# Patient Record
Sex: Male | Born: 1995 | Race: Black or African American | Hispanic: No | Marital: Single | Smoking: Former smoker
Health system: Southern US, Community
[De-identification: ages and names within clinical notes are randomized; demographics above are authoritative.]

## PROBLEM LIST (undated history)

## (undated) DIAGNOSIS — E119 Type 2 diabetes mellitus without complications: Secondary | ICD-10-CM

## (undated) DIAGNOSIS — N186 End stage renal disease: Secondary | ICD-10-CM

## (undated) DIAGNOSIS — I10 Essential (primary) hypertension: Secondary | ICD-10-CM

---

## 2019-06-05 DIAGNOSIS — N183 Chronic kidney disease, stage 3 unspecified: Secondary | ICD-10-CM | POA: Insufficient documentation

## 2019-06-06 DIAGNOSIS — Z9119 Patient's noncompliance with other medical treatment and regimen: Secondary | ICD-10-CM | POA: Insufficient documentation

## 2019-06-06 DIAGNOSIS — Z91199 Patient's noncompliance with other medical treatment and regimen due to unspecified reason: Secondary | ICD-10-CM | POA: Insufficient documentation

## 2019-06-24 DIAGNOSIS — I10 Essential (primary) hypertension: Secondary | ICD-10-CM | POA: Insufficient documentation

## 2019-06-24 DIAGNOSIS — Z5989 Other problems related to housing and economic circumstances: Secondary | ICD-10-CM | POA: Insufficient documentation

## 2019-12-01 DIAGNOSIS — R801 Persistent proteinuria, unspecified: Secondary | ICD-10-CM | POA: Insufficient documentation

## 2019-12-01 DIAGNOSIS — M7989 Other specified soft tissue disorders: Secondary | ICD-10-CM | POA: Insufficient documentation

## 2021-02-27 ENCOUNTER — Other Ambulatory Visit: Payer: Self-pay

## 2021-02-27 ENCOUNTER — Encounter (HOSPITAL_BASED_OUTPATIENT_CLINIC_OR_DEPARTMENT_OTHER): Payer: Self-pay | Admitting: Urology

## 2021-02-27 ENCOUNTER — Emergency Department (HOSPITAL_BASED_OUTPATIENT_CLINIC_OR_DEPARTMENT_OTHER): Payer: Medicaid Other

## 2021-02-27 ENCOUNTER — Inpatient Hospital Stay (HOSPITAL_BASED_OUTPATIENT_CLINIC_OR_DEPARTMENT_OTHER)
Admission: EM | Admit: 2021-02-27 | Discharge: 2021-03-12 | DRG: 674 | Disposition: A | Discharge status: Home / Self Care | Payer: Medicaid Other | Attending: Internal Medicine | Admitting: Internal Medicine

## 2021-02-27 DIAGNOSIS — M6282 Rhabdomyolysis: Secondary | ICD-10-CM | POA: Diagnosis not present

## 2021-02-27 DIAGNOSIS — N186 End stage renal disease: Secondary | ICD-10-CM | POA: Diagnosis present

## 2021-02-27 DIAGNOSIS — Z79899 Other long term (current) drug therapy: Secondary | ICD-10-CM

## 2021-02-27 DIAGNOSIS — N179 Acute kidney failure, unspecified: Secondary | ICD-10-CM | POA: Diagnosis not present

## 2021-02-27 DIAGNOSIS — M898X9 Other specified disorders of bone, unspecified site: Secondary | ICD-10-CM | POA: Diagnosis present

## 2021-02-27 DIAGNOSIS — N19 Unspecified kidney failure: Secondary | ICD-10-CM

## 2021-02-27 DIAGNOSIS — Z9119 Patient's noncompliance with other medical treatment and regimen: Secondary | ICD-10-CM

## 2021-02-27 DIAGNOSIS — E1022 Type 1 diabetes mellitus with diabetic chronic kidney disease: Secondary | ICD-10-CM | POA: Diagnosis present

## 2021-02-27 DIAGNOSIS — N2581 Secondary hyperparathyroidism of renal origin: Secondary | ICD-10-CM | POA: Diagnosis not present

## 2021-02-27 DIAGNOSIS — Z6833 Body mass index (BMI) 33.0-33.9, adult: Secondary | ICD-10-CM | POA: Diagnosis not present

## 2021-02-27 DIAGNOSIS — R112 Nausea with vomiting, unspecified: Secondary | ICD-10-CM | POA: Diagnosis present

## 2021-02-27 DIAGNOSIS — F40240 Claustrophobia: Secondary | ICD-10-CM | POA: Diagnosis not present

## 2021-02-27 DIAGNOSIS — E669 Obesity, unspecified: Secondary | ICD-10-CM | POA: Diagnosis present

## 2021-02-27 DIAGNOSIS — E10319 Type 1 diabetes mellitus with unspecified diabetic retinopathy without macular edema: Secondary | ICD-10-CM | POA: Diagnosis not present

## 2021-02-27 DIAGNOSIS — I120 Hypertensive chronic kidney disease with stage 5 chronic kidney disease or end stage renal disease: Secondary | ICD-10-CM | POA: Diagnosis not present

## 2021-02-27 DIAGNOSIS — Z794 Long term (current) use of insulin: Secondary | ICD-10-CM | POA: Diagnosis not present

## 2021-02-27 DIAGNOSIS — Z20822 Contact with and (suspected) exposure to covid-19: Secondary | ICD-10-CM | POA: Diagnosis not present

## 2021-02-27 DIAGNOSIS — D631 Anemia in chronic kidney disease: Secondary | ICD-10-CM | POA: Diagnosis present

## 2021-02-27 DIAGNOSIS — I160 Hypertensive urgency: Secondary | ICD-10-CM | POA: Diagnosis present

## 2021-02-27 DIAGNOSIS — E139 Other specified diabetes mellitus without complications: Secondary | ICD-10-CM | POA: Diagnosis present

## 2021-02-27 DIAGNOSIS — E871 Hypo-osmolality and hyponatremia: Secondary | ICD-10-CM | POA: Diagnosis present

## 2021-02-27 DIAGNOSIS — Z833 Family history of diabetes mellitus: Secondary | ICD-10-CM

## 2021-02-27 DIAGNOSIS — E860 Dehydration: Secondary | ICD-10-CM | POA: Diagnosis present

## 2021-02-27 DIAGNOSIS — Z992 Dependence on renal dialysis: Secondary | ICD-10-CM | POA: Diagnosis not present

## 2021-02-27 DIAGNOSIS — Z8249 Family history of ischemic heart disease and other diseases of the circulatory system: Secondary | ICD-10-CM | POA: Diagnosis not present

## 2021-02-27 DIAGNOSIS — R9431 Abnormal electrocardiogram [ECG] [EKG]: Secondary | ICD-10-CM | POA: Diagnosis present

## 2021-02-27 DIAGNOSIS — N189 Chronic kidney disease, unspecified: Secondary | ICD-10-CM

## 2021-02-27 DIAGNOSIS — I161 Hypertensive emergency: Secondary | ICD-10-CM | POA: Diagnosis not present

## 2021-02-27 HISTORY — DX: Essential (primary) hypertension: I10

## 2021-02-27 HISTORY — DX: Type 2 diabetes mellitus without complications: E11.9

## 2021-02-27 LAB — CBC WITH DIFFERENTIAL/PLATELET
Abs Immature Granulocytes: 0.01 10*3/uL (ref 0.00–0.07)
Basophils Absolute: 0 10*3/uL (ref 0.0–0.1)
Basophils Relative: 0 %
Eosinophils Absolute: 0.2 10*3/uL (ref 0.0–0.5)
Eosinophils Relative: 3 %
HCT: 28.8 % — ABNORMAL LOW (ref 39.0–52.0)
Hemoglobin: 9.6 g/dL — ABNORMAL LOW (ref 13.0–17.0)
Immature Granulocytes: 0 %
Lymphocytes Relative: 21 %
Lymphs Abs: 1.5 10*3/uL (ref 0.7–4.0)
MCH: 27 pg (ref 26.0–34.0)
MCHC: 33.3 g/dL (ref 30.0–36.0)
MCV: 80.9 fL (ref 80.0–100.0)
Monocytes Absolute: 0.6 10*3/uL (ref 0.1–1.0)
Monocytes Relative: 9 %
Neutro Abs: 4.5 10*3/uL (ref 1.7–7.7)
Neutrophils Relative %: 67 %
Platelets: 333 10*3/uL (ref 150–400)
RBC: 3.56 MIL/uL — ABNORMAL LOW (ref 4.22–5.81)
RDW: 12.8 % (ref 11.5–15.5)
WBC: 6.8 10*3/uL (ref 4.0–10.5)
nRBC: 0 % (ref 0.0–0.2)

## 2021-02-27 LAB — COMPREHENSIVE METABOLIC PANEL
ALT: 15 U/L (ref 0–44)
AST: 16 U/L (ref 15–41)
Albumin: 3.1 g/dL — ABNORMAL LOW (ref 3.5–5.0)
Alkaline Phosphatase: 103 U/L (ref 38–126)
Anion gap: 20 — ABNORMAL HIGH (ref 5–15)
BUN: 121 mg/dL — ABNORMAL HIGH (ref 6–20)
CO2: 18 mmol/L — ABNORMAL LOW (ref 22–32)
Calcium: 7 mg/dL — ABNORMAL LOW (ref 8.9–10.3)
Chloride: 102 mmol/L (ref 98–111)
Creatinine, Ser: 25.39 mg/dL — ABNORMAL HIGH (ref 0.61–1.24)
GFR, Estimated: 2 mL/min — ABNORMAL LOW (ref >60)
Glucose, Bld: 107 mg/dL — ABNORMAL HIGH (ref 70–99)
Potassium: 3.8 mmol/L (ref 3.5–5.1)
Sodium: 140 mmol/L (ref 135–145)
Total Bilirubin: 0.5 mg/dL (ref 0.3–1.2)
Total Protein: 7 g/dL (ref 6.5–8.1)

## 2021-02-27 LAB — LIPASE, BLOOD: Lipase: 64 U/L — ABNORMAL HIGH (ref 11–51)

## 2021-02-27 LAB — CBG MONITORING, ED: Glucose-Capillary: 95 mg/dL (ref 70–99)

## 2021-02-27 MED ORDER — ONDANSETRON HCL 4 MG PO TABS: 4.0000 mg | ORAL_TABLET | Freq: Four times a day (QID) | ORAL | Status: DC | PRN

## 2021-02-27 MED ORDER — ACETAMINOPHEN 650 MG RE SUPP: 650.0000 mg | Freq: Four times a day (QID) | RECTAL | Status: DC | PRN

## 2021-02-27 MED ORDER — SODIUM CHLORIDE 0.9 % IV BOLUS
1000.0000 mL | Freq: Once | INTRAVENOUS | Status: AC
  Administered 2021-02-27: 1000 mL via INTRAVENOUS

## 2021-02-27 MED ORDER — LABETALOL HCL 5 MG/ML IV SOLN
20.0000 mg | Freq: Once | INTRAVENOUS | Status: AC
  Administered 2021-02-27: 20 mg via INTRAVENOUS
  Filled 2021-02-27: qty 4

## 2021-02-27 MED ORDER — ONDANSETRON HCL 4 MG/2ML IJ SOLN
4.0000 mg | Freq: Once | INTRAMUSCULAR | Status: AC
  Administered 2021-02-27: 4 mg via INTRAVENOUS
  Filled 2021-02-27: qty 2

## 2021-02-27 MED ORDER — AMLODIPINE BESYLATE 5 MG PO TABS
5.0000 mg | ORAL_TABLET | Freq: Every day | ORAL | Status: DC
  Administered 2021-02-28: 5 mg via ORAL
  Filled 2021-02-27 (×2): qty 1

## 2021-02-27 MED ORDER — ACETAMINOPHEN 325 MG PO TABS
650.0000 mg | ORAL_TABLET | Freq: Four times a day (QID) | ORAL | Status: DC | PRN
  Administered 2021-03-01 – 2021-03-06 (×5): 650 mg via ORAL
  Filled 2021-02-27 (×8): qty 2

## 2021-02-27 MED ORDER — HEPARIN SODIUM (PORCINE) 5000 UNIT/ML IJ SOLN
5000.0000 [IU] | Freq: Three times a day (TID) | INTRAMUSCULAR | Status: DC
  Administered 2021-02-28 (×2): 5000 [IU] via SUBCUTANEOUS
  Filled 2021-02-27 (×2): qty 1

## 2021-02-27 MED ORDER — ONDANSETRON HCL 4 MG/2ML IJ SOLN: 4.0000 mg | Freq: Four times a day (QID) | INTRAMUSCULAR | Status: DC | PRN

## 2021-02-27 NOTE — ED Provider Notes (Signed)
[facility]ERGENC[facility] DEPARTMENT Provider Note   CSN: [id number] Arrival date & time: 02/27/21  1808     History Chief Complaint  Patient presents with   Weakness   Blood Sugar Problem    Bobby [name] is a 25 y.o. male.  Patient with history of insulin-dependent diabetes, hypertension on metoprolol presents to the emergency department for evaluation of generalized weakness.  He reports having daily nausea and vomiting over the past week.  He has been able to take his medications and does report taking his metoprolol today.  No chest pain, shortness of breath or cough.  No abdominal pain or diarrhea.  No urinary symptoms.  He denies headache.  No vision loss but states that his vision appears "dark".  Patient denies signs of stroke including: facial droop, slurred speech, aphasia, weakness/numbness in extremities, imbalance/trouble walking.  The onset of this condition was acute. The course is constant. Aggravating factors: none. Alleviating factors: none.  Patient is a daily marijuana user.       Past Medical History:  Diagnosis Date   Diabetes mellitus without complication ([place])    Hypertension     There are no problems to display for this patient.   History reviewed. No pertinent surgical history.     History reviewed. No pertinent family history.  Social History   Tobacco Use   Smoking status: Never  Substance Use Topics   Alcohol use: Never   Drug use: Yes    Types: Marijuana    Comment: daily    Home Medications Prior to Admission medications   Medication Sig Start Date End Date Taking? Authorizing Provider  insulin aspart (NOVOLOG) 100 UNIT/ML injection Inject into the skin 3 (three) times daily before meals.   Yes [provider]  metoprolol succinate (TOPROL-XL) 50 MG 24 hr tablet Take 50 mg by mouth daily. Take with or immediately following a meal.   Yes [provider]    Allergies    Patient has no allergy  information on record.  Review of Systems   Review of Systems  Constitutional:  Positive for fatigue. Negative for fever.  HENT:  Negative for rhinorrhea and sore throat.   Eyes:  Positive for visual disturbance. Negative for redness.  Respiratory:  Negative for cough.   Cardiovascular:  Negative for chest pain.  Gastrointestinal:  Positive for nausea and vomiting. Negative for abdominal pain and diarrhea.  Genitourinary:  Negative for dysuria and hematuria.  Musculoskeletal:  Negative for myalgias.  Skin:  Negative for rash.  Neurological:  Negative for headaches.   Physical Exam Updated Vital Signs BP (!) 193/120 (BP Location: Right Arm)   Pulse 90   Temp 98.4 F (36.9 C) (Oral)   Resp 18   Ht '6\' 5"'$  (1.956 m)   SpO2 97%   Physical Exam Vitals and nursing note reviewed.  Constitutional:      General: He is not in acute distress.    Appearance: He is well-developed.  HENT:     Head: Normocephalic and atraumatic.     Right Ear: External ear normal.     Left Ear: External ear normal.     Nose: Nose normal.     Mouth/Throat:     Mouth: Mucous membranes are moist.  Eyes:     General:        Right eye: No discharge.        Left eye: No discharge.     Conjunctiva/sclera: Conjunctivae normal.  Cardiovascular:  Rate and Rhythm: Normal rate and regular rhythm.     Heart sounds: Normal heart sounds.  Pulmonary:     Effort: Pulmonary effort is normal.     Breath sounds: Normal breath sounds.  Abdominal:     Palpations: Abdomen is soft.     Tenderness: There is no abdominal tenderness.  Musculoskeletal:     Cervical back: Normal range of motion and neck supple.  Skin:    General: Skin is warm and dry.  Neurological:     Mental Status: He is alert.    ED Results / Procedures / Treatments   Labs (all labs ordered are listed, but only abnormal results are displayed) Labs Reviewed  CBC WITH DIFFERENTIAL/PLATELET - Abnormal; Notable for the following components:       Result Value   RBC 3.56 (*)    Hemoglobin 9.6 (*)    HCT 28.8 (*)    All other components within normal limits  COMPREHENSIVE METABOLIC PANEL - Abnormal; Notable for the following components:   CO2 18 (*)    Glucose, Bld 107 (*)    BUN 121 (*)    Creatinine, Ser 25.39 (*)    Calcium 7.0 (*)    Albumin 3.1 (*)    GFR, Estimated 2 (*)    Anion gap 20 (*)    All other components within normal limits  LIPASE, BLOOD - Abnormal; Notable for the following components:   Lipase 64 (*)    All other components within normal limits  SARS CORONAVIRUS 2 (TAT 6-24 HRS)  CBG MONITORING, ED    EKG EKG Interpretation  Date/Time:  Sunday February 27 2021 18:28:09 EDT Ventricular Rate:  93 PR Interval:  158 QRS Duration: 88 QT Interval:  412 QTC Calculation: 512 R Axis:   -21 Text Interpretation: Normal sinus rhythm T wave abnormality, consider lateral ischemia Prolonged QT Abnormal ECG No old tracing to compare Confirmed by Deno Etienne [phone]) on 02/27/2021 7:35:09 PM  Radiology No results found.  Procedures Procedures   Medications Ordered in ED Medications  labetalol (NORMODYNE) injection 20 mg (has no administration in time range)  sodium chloride 0.9 % bolus 1,000 mL (1,000 mLs Intravenous New Bag/Given 02/27/21 1854)  ondansetron (ZOFRAN) injection 4 mg (4 mg Intravenous Given 02/27/21 1852)  labetalol (NORMODYNE) injection 20 mg (20 mg Intravenous Given 02/27/21 1906)    ED Course  I have reviewed the triage vital signs and the nursing notes.  Pertinent labs & imaging results that were available during my care of the patient were reviewed by me and considered in my medical decision making (see chart for details).  Patient seen and examined.  We will treat elevated blood pressure, 206/142 on arrival.  We will check lab work.  Patient has had elevated creatinine in the past.  Vital signs reviewed and are as follows: BP (!) 193/120 (BP Location: Right Arm)   Pulse 90   Temp 98.4 F  (36.9 C) (Oral)   Resp 18   Ht '6\' 5"'$  (1.956 m)   SpO2 97%   8:07 PM patient with new onset of renal failure.  Baseline creatinine as of early 2021 was 1.8-2.0.  Patient has been residing in [place] up until 2 weeks ago.  First dose of 20 mg of labetalol has not really changed his blood pressure.  Will redose.  Discussed with patient and mother now at bedside.  [facility] need admission to [facility].  Will speak with nephrology.  8:17 PM spoke with Dr.  Foster of nephrology.  They will see when patient is admitted, request [facility].  They request CT of the abdomen and pelvis without contrast, Foley catheter.  They agree with blood pressure control, target 0000000 systolic.   Will discuss with hospitalist service.  8:35 PM Spoke with Dr. Olevia Bowens who accepts for admission. Pt refusing foley.   CRITICAL CARE Performed by: Carlisle Cater PA-C Total critical care time: 45 minutes Critical care time was exclusive of separately billable procedures and treating other patients. Critical care was necessary to treat or prevent imminent or life-threatening deterioration. Critical care was time spent personally by me on the following activities: development of treatment plan with patient and/or surrogate as well as nursing, discussions with consultants, evaluation of patient's response to treatment, examination of patient, obtaining history from patient or surrogate, ordering and performing treatments and interventions, ordering and review of laboratory studies, ordering and review of radiographic studies, pulse oximetry and re-evaluation of patient's condition.     MDM Rules/Calculators/A&P                          Admit.    Final Clinical Impression(s) / ED Diagnoses Final diagnoses:  Acute renal failure superimposed on chronic kidney disease, unspecified CKD stage, unspecified acute renal failure type [facility])  Hypertensive emergency without congestive heart failure  Uremia    Rx / DC Orders ED  Discharge Orders     None        Carlisle Cater, PA-C 02/27/21 2041    Deno Etienne, DO 02/27/21 2319

## 2021-02-27 NOTE — H&P (Signed)
Rondald [name] [id number] DOB: [birth year] DOA: 02/27/2021     PCP: Merryl Hacker, No   Outpatient Specialists:     [facility]: [facility]    Patient arrived to ER on 02/27/21 at 1808 Referred by Attending Toy Baker, MD   Patient coming from: home Lives  With family    Chief Complaint:  Chief Complaint  Patient presents with   Weakness   Blood Sugar Problem    HPI: Bobby [name] is a 25 y.o. male with medical history significant of DM1, CKD, HTN tobacco abuse    Presented with loss of appetite nausea and vomiting for about 1 week.  Blood pressure has been poorly controlled as well as blood sugars.  Patient recently moved to [facility].  He has not established follow-up with his endocrinologist yet.  Patient is taking insulin with meals but no on long-lasting insulin.  His blood pressure has been taking metoprolol. Patient states that he does have known history of kidney disease and has been told in the past he may need to be on dialysis.  States he was taking his medications no chest pain or shortness of breath.  In the past he used to be on following medications:  atorvastatin (LIPITOR) 20 MG tablet TAKE 1 TABLET BY MOUTH DAILY. 90 tablet 0      furosemide (LASIX) 20 MG tablet Take 2 tablets in morning and 1 tablet at lunch. 90 tablet 3   hydrALAZINE (APRESOLINE) 100 MG tablet Take 1 tablet (100 mg total) by mouth every 8 hours. 270 tablet 0   insulin CONCENTRATED glargine (TOUJEO MAX SOLOSTAR) 300 unit/mL (3 mL) injection Inject 25 Units into the skin daily. 3 pen 3   insulin lispro (HUMALOG U-100 INSULIN) 100 unit/mL injection INJECT 4 UNITS INTO THE SKIN 3 TIMES DAILY WITH MEALS 10 mL 2    levETIRAcetam (KEPPRA) 500 MG tablet Take 1 tablet (500 mg total) by mouth 2 times daily. 180 tablet 1   metoPROLOL tartrate (LOPRESSOR) 100 MG tablet TAKE 1 TABLET BY MOUTH 2 TIMES DAILY. 180 tablet 0   lisinopriL (PRINIVIL,ZESTRIL) 40 MG tablet Take 1 tablet (40 mg  total) by mouth daily. 90 tablet 3    As he was lost to follow up most of this were not refilled Patient continues to smoke  Has  been vaccinated against COVID    Initial COVID TEST   in house  PCR testing  Pending  No results found for: SARSCOV2NAA   Regarding pertinent Chronic problems:    HTN on metoprolol  DM 1 -  on insulin,    obesity-   BMI Readings from Last 1 Encounters:  02/27/21 33.41 kg/m   CKD stage iiib- baseline Cr *2.1 in January 2021    Chronic anemia - baseline hg Hemoglobin & Hematocrit  Recent Labs    02/27/21 1825  HGB 9.6*     While in ER: Noted to be elevated blood pressure 206/142 Cr 25 Nephrology was consulted recommended control blood pressure Maintain blood pressure around 1 [id number] systolic. Recommended also placing Foley catheter patient refused.  Patient was transferred to [facility]   ED Triage Vitals  Enc Vitals Group     BP 02/27/21 1815 (!) 206/142     Pulse Rate 02/27/21 1815 95     Resp 02/27/21 1815 18     Temp 02/27/21 1815 98.4 F (36.9 C)     Temp Source 02/27/21 1815 Oral     SpO2 02/27/21 1815 99 %  Weight 02/27/21 2022 285 lb 9.6 oz (129.5 kg)     Height 02/27/21 1816 '6\' 5"'$  (1.956 m)     Head Circumference --      Peak Flow --      Pain Score 02/27/21 1816 0     Pain Loc --      Pain Edu? --      Excl. in [place]? --   TMAX(24)@     _________________________________________ Significant initial  Findings: Abnormal Labs Reviewed  CBC WITH DIFFERENTIAL/PLATELET - Abnormal; Notable for the following components:      Result Value   RBC 3.56 (*)    Hemoglobin 9.6 (*)    HCT 28.8 (*)    All other components within normal limits  COMPREHENSIVE METABOLIC PANEL - Abnormal; Notable for the following components:   CO2 18 (*)    Glucose, Bld 107 (*)    BUN 121 (*)    Creatinine, Ser 25.39 (*)    Calcium 7.0 (*)    Albumin 3.1 (*)    GFR, Estimated 2 (*)    Anion gap 20 (*)    All other components within normal limits   LIPASE, BLOOD - Abnormal; Notable for the following components:   Lipase 64 (*)    All other components within normal limits   ____________________________________________ Ordered    CTabd/pelvis -  non acute    ECG: Ordered Personally reviewed by me showing: HR : 93 Rhythm:  NSR,    no evidence of ischemic changes QTC 512   The recent clinical data is shown below. Vitals:   02/27/21 2022 02/27/21 2030 02/27/21 2235 02/27/21 2238  BP:  (!) 181/102 (!) 210/158   Pulse:  88 88   Resp:  18 18   Temp:   98.4 F (36.9 C)   TempSrc:   Oral   SpO2:  98%    Weight: 129.5 kg   127.8 kg  Height:    '6\' 5"'$  (1.956 m)      WBC     Component Value Date/Time   WBC 6.8 02/27/2021 1825   LYMPHSABS 1.5 02/27/2021 1825   MONOABS 0.6 02/27/2021 1825   EOSABS 0.2 02/27/2021 1825   BASOSABS 0.0 02/27/2021 1825        UA  ordered     _______________________________________________________ ER Provider Called:  nephrology   Dr. Royce Macadamia They Recommend admit to medicine  Will see in AM  _______________________________________________ Hospitalist was called for admission for aki  The following Work up has been ordered so far:  Orders Placed This Encounter  Procedures   SARS CORONAVIRUS 2 (TAT 6-24 HRS) Nasopharyngeal Nasopharyngeal Swab   CT ABDOMEN PELVIS WO CONTRAST   CBC with Differential   Comprehensive metabolic panel   Lipase, blood   Measure blood pressure   Ice chips   Insert foley catheter   Cardiac monitoring   Consult to nephrology   Consult to hospitalist   POC CBG, ED   ED EKG   EKG 12-Lead   Place in observation (patient's expected length of stay will be less than 2 midnights)      Following Medications were ordered in ER: Medications  labetalol (NORMODYNE) injection 20 mg (has no administration in time range)  sodium chloride 0.9 % bolus 1,000 mL (0 mLs Intravenous Stopped 02/27/21 2018)  ondansetron (ZOFRAN) injection 4 mg (4 mg Intravenous Given  02/27/21 1852)  labetalol (NORMODYNE) injection 20 mg (20 mg Intravenous Given 02/27/21 1906)  labetalol (NORMODYNE) injection 20  mg (20 mg Intravenous Given 02/27/21 2026)        Consult Orders  (From admission, onward)           Start     Ordered   02/27/21 2016  Consult to hospitalist  Once       Provider:  (Not yet assigned)  Question Answer Comment  Place call to: [facility]   Reason for Consult Admit      02/27/21 2015              OTHER Significant initial  Findings:  labs showing:    Recent Labs  Lab 02/27/21 1825  NA 140  K 3.8  CO2 18*  GLUCOSE 107*  BUN 121*  CREATININE 25.39*  CALCIUM 7.0*    Cr  * stable,  Up from baseline see below Lab Results  Component Value Date   CREATININE 25.39 (H) 02/27/2021    Recent Labs  Lab 02/27/21 1825  AST 16  ALT 15  ALKPHOS 103  BILITOT 0.5  PROT 7.0  ALBUMIN 3.1*   Lab Results  Component Value Date   CALCIUM 7.0 (L) 02/27/2021          Plt: Lab Results  Component Value Date   PLT 333 02/27/2021       COVID-19 Labs  No results for input(s): DDIMER, FERRITIN, LDH, CRP in the last 72 hours.  No results found for: [place]  Lab 02/27/21 1825  WBC 6.8  NEUTROABS 4.5  HGB 9.6*  HCT 28.8*  MCV 80.9  PLT 333    HG/HCT * stable,  Down *Up from baseline see below    Component Value Date/Time   HGB 9.6 (L) 02/27/2021 1825   HCT 28.8 (L) 02/27/2021 1825   MCV 80.9 02/27/2021 1825      Recent Labs  Lab 02/27/21 1825  LIPASE 64*   No results for input(s): AMMONIA in the last 168 hours.   Cardiac Panel (last 3 results) No results for input(s): CKTOTAL, CKMB, TROPONINI, RELINDX in the last 72 hours.   BNP (last 3 results) No results for input(s): BNP in the last 8760 hours.    DM  labs:  HbA1C: No results for input(s): HGBA1C in the last 8760 hours.     CBG (last 3)  Recent Labs    02/27/21 1820  GLUCAP 95          Cultures: No  results found for: SDES, SPECREQUEST, CULT, REPTSTATUS   Radiological Exams on Admission: CT ABDOMEN PELVIS WO CONTRAST  Result Date: 02/27/2021 CLINICAL DATA:  Nausea, weakness and loss of appetite x1 week. EXAM: CT ABDOMEN AND PELVIS WITHOUT CONTRAST TECHNIQUE: Multidetector CT imaging of the abdomen and pelvis was performed following the standard protocol without IV contrast. COMPARISON:  None. FINDINGS: Lower chest: Mild atelectasis is seen within the bilateral lung bases. Hepatobiliary: No focal liver abnormality is seen. No gallstones, gallbladder wall thickening, or biliary dilatation. Pancreas: Unremarkable. No pancreatic ductal dilatation or surrounding inflammatory changes. Spleen: Normal in size without focal abnormality. Adrenals/Urinary Tract: Adrenal glands are unremarkable. Kidneys are normal, without renal calculi, focal lesion, or hydronephrosis. Bladder is unremarkable. Stomach/Bowel: There is a small hiatal hernia. Appendix appears normal. No evidence of bowel wall thickening, distention, or inflammatory changes. Vascular/Lymphatic: No significant vascular findings are present. No enlarged abdominal or pelvic lymph nodes. Reproductive: Prostate is unremarkable. Other: No abdominal wall hernia or abnormality. No abdominopelvic ascites. Musculoskeletal: No acute or  significant osseous findings. IMPRESSION: 1. Small hiatal hernia. 2. No acute or active process within the abdomen or pelvis. Electronically Signed   By: Virgina Norfolk M.D.   On: 02/27/2021 21:00   _______________________________________________________________________________________________________ Latest  Blood pressure (!) 210/158, pulse 88, temperature 98.4 F (36.9 C), temperature source Oral, resp. rate 18, height '6\' 5"'$  (1.956 m), weight 127.8 kg, SpO2 98 %.   Review of Systems:    Pertinent positives include:  fatigue, nausea, vomiting, Constitutional:  No weight loss, night sweats, Fevers, chills,  weight loss   HEENT:  No headaches, Difficulty swallowing,Tooth/dental problems,Sore throat,  No sneezing, itching, ear ache, nasal congestion, post nasal drip,  Cardio-vascular:  No chest pain, Orthopnea, PND, anasarca, dizziness, palpitations.no Bilateral lower extremity swelling  GI:  No heartburn, indigestion, abdominal pain,  diarrhea, change in bowel habits, loss of appetite, melena, blood in stool, hematemesis Resp:  no shortness of breath at rest. No dyspnea on exertion, No excess mucus, no productive cough, No non-productive cough, No coughing up of blood.No change in color of mucus.No wheezing. Skin:  no rash or lesions. No jaundice GU:  no dysuria, change in color of urine, no urgency or frequency. No straining to urinate.  No flank pain.  Musculoskeletal:  No joint pain or no joint swelling. No decreased range of motion. No back pain.  Psych:  No change in mood or affect. No depression or anxiety. No memory loss.  Neuro: no localizing neurological complaints, no tingling, no weakness, no double vision, no gait abnormality, no slurred speech, no confusion  All systems reviewed and apart from [place] all are negative _______________________________________________________________________________________________ Past Medical History:   Past Medical History:  Diagnosis Date   Diabetes mellitus without complication ([place])    Hypertension       History reviewed. No pertinent surgical history.  Social History:  Ambulatory  independently         reports current drug use. Drug: Marijuana. He reports that he does not drink alcohol. No history on file for tobacco use.     Family History:   Family History  Problem Relation Age of Onset   Diabetes Mother    Kidney disease Other    Hypertension Other    ______________________________________________________________________________________________ Allergies: Not on File   Prior to Admission medications   Medication Sig Start Date  End Date Taking? Authorizing Provider  insulin aspart (NOVOLOG) 100 UNIT/ML injection Inject into the skin 3 (three) times daily before meals.   Yes [provider]  metoprolol succinate (TOPROL-XL) 50 MG 24 hr tablet Take 50 mg by mouth daily. Take with or immediately following a meal.   Yes [provider]    ___________________________________________________________________________________________________ Physical Exam: Vitals with BMI 02/27/2021 02/27/2021 02/27/2021  Height '6\' 5"'$  - -  Weight 281 lbs 12 oz - -  BMI [id number] - -  Systolic - A999333 0000000  Diastolic - 0000000 A999333  Pulse - 88 88     1. General:  in No Acute distress   Chronically ill    -appearing 2. Psychological: Alert and  Oriented 3. Head/ENT:     Dry Mucous Membranes                          Head Non traumatic, neck supple                          Normal Dentition 4. SKIN: decreased Skin turgor,  Skin clean Dry and intact no rash 5. Heart: Regular rate and rhythm no Murmur, no Rub or gallop 6. Lungs:  no wheezes or crackles   7. Abdomen: Soft,  non-tender, Non distended   obese  bowel sounds present 8. Lower extremities: no clubbing, cyanosis, no  edema 9. Neurologically Grossly intact, moving all 4 extremities equally   10. MSK: Normal range of motion    Chart has been reviewed  ______________________________________________________________________________________________  Assessment/Plan  25 y.o. male with medical history significant of DM1, CKD, HTN tobacco abuse    Admitted for AKI   Present on Admission:  AKI -acute on chronic renal failure possibly in the setting of dehydration versus progression of renal disease.  Appreciate nephrology consult n.p.o. postmidnight for possible hemodialysis catheter placement Obtain urine electrolytes patient has refused Foley cath   Rhabdomyolysis likely secondary to dehydration will rehydrate, repeat CK Appreciate nephrology consult  Hypertensive urgency   -use as needed labetalol and hydralazine to lower BP down to 160s 170s.  May need nitro drip if unable.  Restart home medications as able to tolerate.  Patient chronically has significant hypertension with systolics above [id number] chronically.  Worsened by noncompliance   Prolonged QT interval - - will monitor on tele avoid QT prolonging medications, rehydrate correct electrolytes   Anemia due to chronic kidney disease obtain anemia panel at this point no indication for blood transfusion   Nausea & vomiting -likely secondary to uremia.  Supportive management treat underlying AKI Versus marijuana use versus gastroenteritis   Dehydration -rehydrate and follow fluid status   Diabetes 1.5, managed as type 1 ([place]) -order sliding scale.  Hold off on long-lasting insulin for now given significant AKI likely poor clearance of insulin at this point.   Other plan as per orders.  DVT prophylaxis:  hep [place]    Code Status:    Code Status: Full Code   as per patient   I had personally discussed CODE STATUS with patient     Family Communication:   Family not at  Bedside    Disposition Plan:      To home once workup is complete and patient is stable   Following barriers for discharge:                            Electrolytes corrected                                                                                     Will need consultants to evaluate patient prior to discharge                                   Diabetes care coordinator                                     Consults called: nephrology is aware  Admission status:  ED Disposition     ED Disposition  Admit   Condition  --   Comment  Hospital Area: [place] [[id number]]  Level of Care: Progressive [102]  Admit to Progressive based on following criteria: NEPHROLOGY stable condition requiring close monitoring for AKI, requiring Hemodialysis or Peritoneal Dialysis either from expected electrolyte imbalance, acidosis,  or fluid overload that can be managed by NIPPV or high flow oxygen.  Interfacility transfer: Yes  May place patient in observation at [facility] or [place] if equivalent level of care is available:: No  Covid Evaluation: Asymptomatic Screening Protocol (No Symptoms)  Diagnosis: Acute renal failure (ARF) [facility]) [id number]  Admitting Physician: Reubin Milan [id number]  Attending Physician: Reubin Milan [id number]           Obs   Level of care   progressive tele indefinitely please discontinue once patient no longer qualifies COVID-19 Labs    No results found for: SARSCOV2NAA   Precautions: admitted as  asymptomatic screening protocol    PPE: Used by the provider:   N95  eye Goggles,  Gloves     Wilmer Santillo 02/28/2021, 1:28 AM    [facility]     after 2 AM please page floor coverage PA If 7AM-7PM, please contact the day team taking care of the patient using [email or web address]   Patient was evaluated in the context of the global COVID-19 pandemic, which necessitated consideration that the patient might be at risk for infection with the SARS-CoV-2 virus that causes COVID-19. Institutional protocols and algorithms that pertain to the evaluation of patients at risk for COVID-19 are in a state of rapid change based on information released by regulatory bodies including the CDC and federal and state organizations. These policies and algorithms were followed during the patient's care.

## 2021-02-27 NOTE — ED Triage Notes (Signed)
Weakness loss of appetie, nausea x 1 week, h/o DM states blurry vision for past week

## 2021-02-27 NOTE — Plan of Care (Signed)
  Problem: Clinical Measurements: Goal: Respiratory complications will improve Outcome: Not Applicable   Problem: Nutrition: Goal: Adequate nutrition will be maintained Outcome: Progressing

## 2021-02-27 NOTE — Progress Notes (Addendum)
Patient arrived via stretcher from [facility], admitted to room 2W05, pt was oriented to room, call light is within reach. Admitting provider was notified via pager.

## 2021-02-27 NOTE — ED Notes (Addendum)
Report given to [facility] 2W room 5.

## 2021-02-28 ENCOUNTER — Encounter (HOSPITAL_COMMUNITY): Payer: Self-pay | Admitting: Internal Medicine

## 2021-02-28 ENCOUNTER — Inpatient Hospital Stay (HOSPITAL_COMMUNITY): Payer: Medicaid Other

## 2021-02-28 DIAGNOSIS — I120 Hypertensive chronic kidney disease with stage 5 chronic kidney disease or end stage renal disease: Secondary | ICD-10-CM | POA: Diagnosis present

## 2021-02-28 DIAGNOSIS — N189 Chronic kidney disease, unspecified: Secondary | ICD-10-CM | POA: Diagnosis not present

## 2021-02-28 DIAGNOSIS — E1022 Type 1 diabetes mellitus with diabetic chronic kidney disease: Secondary | ICD-10-CM | POA: Diagnosis present

## 2021-02-28 DIAGNOSIS — N19 Unspecified kidney failure: Secondary | ICD-10-CM

## 2021-02-28 DIAGNOSIS — E860 Dehydration: Secondary | ICD-10-CM | POA: Diagnosis present

## 2021-02-28 DIAGNOSIS — E871 Hypo-osmolality and hyponatremia: Secondary | ICD-10-CM | POA: Diagnosis present

## 2021-02-28 DIAGNOSIS — Z992 Dependence on renal dialysis: Secondary | ICD-10-CM | POA: Diagnosis not present

## 2021-02-28 DIAGNOSIS — Z20822 Contact with and (suspected) exposure to covid-19: Secondary | ICD-10-CM | POA: Diagnosis present

## 2021-02-28 DIAGNOSIS — N179 Acute kidney failure, unspecified: Secondary | ICD-10-CM | POA: Diagnosis present

## 2021-02-28 DIAGNOSIS — E139 Other specified diabetes mellitus without complications: Secondary | ICD-10-CM | POA: Diagnosis present

## 2021-02-28 DIAGNOSIS — Z79899 Other long term (current) drug therapy: Secondary | ICD-10-CM | POA: Diagnosis not present

## 2021-02-28 DIAGNOSIS — E669 Obesity, unspecified: Secondary | ICD-10-CM | POA: Diagnosis present

## 2021-02-28 DIAGNOSIS — M6282 Rhabdomyolysis: Secondary | ICD-10-CM | POA: Diagnosis present

## 2021-02-28 DIAGNOSIS — R112 Nausea with vomiting, unspecified: Secondary | ICD-10-CM | POA: Diagnosis present

## 2021-02-28 DIAGNOSIS — Z794 Long term (current) use of insulin: Secondary | ICD-10-CM | POA: Diagnosis not present

## 2021-02-28 DIAGNOSIS — Z6833 Body mass index (BMI) 33.0-33.9, adult: Secondary | ICD-10-CM | POA: Diagnosis not present

## 2021-02-28 DIAGNOSIS — Z9119 Patient's noncompliance with other medical treatment and regimen: Secondary | ICD-10-CM | POA: Diagnosis not present

## 2021-02-28 DIAGNOSIS — N2581 Secondary hyperparathyroidism of renal origin: Secondary | ICD-10-CM | POA: Diagnosis present

## 2021-02-28 DIAGNOSIS — F40240 Claustrophobia: Secondary | ICD-10-CM | POA: Diagnosis not present

## 2021-02-28 DIAGNOSIS — I160 Hypertensive urgency: Secondary | ICD-10-CM | POA: Diagnosis not present

## 2021-02-28 DIAGNOSIS — R9431 Abnormal electrocardiogram [ECG] [EKG]: Secondary | ICD-10-CM | POA: Diagnosis present

## 2021-02-28 DIAGNOSIS — Z8249 Family history of ischemic heart disease and other diseases of the circulatory system: Secondary | ICD-10-CM | POA: Diagnosis not present

## 2021-02-28 DIAGNOSIS — D631 Anemia in chronic kidney disease: Secondary | ICD-10-CM | POA: Diagnosis present

## 2021-02-28 DIAGNOSIS — Z833 Family history of diabetes mellitus: Secondary | ICD-10-CM | POA: Diagnosis not present

## 2021-02-28 DIAGNOSIS — N1832 Chronic kidney disease, stage 3b: Secondary | ICD-10-CM | POA: Diagnosis not present

## 2021-02-28 DIAGNOSIS — I161 Hypertensive emergency: Secondary | ICD-10-CM | POA: Diagnosis present

## 2021-02-28 DIAGNOSIS — M898X9 Other specified disorders of bone, unspecified site: Secondary | ICD-10-CM | POA: Diagnosis present

## 2021-02-28 DIAGNOSIS — E10319 Type 1 diabetes mellitus with unspecified diabetic retinopathy without macular edema: Secondary | ICD-10-CM | POA: Diagnosis present

## 2021-02-28 DIAGNOSIS — N186 End stage renal disease: Secondary | ICD-10-CM | POA: Diagnosis present

## 2021-02-28 HISTORY — PX: IR PERC TUN PERIT CATH WO PORT S&I /IMAG: IMG2327

## 2021-02-28 HISTORY — PX: IR US GUIDE VASC ACCESS RIGHT: IMG2390

## 2021-02-28 LAB — COMPREHENSIVE METABOLIC PANEL
ALT: 15 U/L (ref 0–44)
AST: 14 U/L — ABNORMAL LOW (ref 15–41)
Albumin: 2.7 g/dL — ABNORMAL LOW (ref 3.5–5.0)
Alkaline Phosphatase: 93 U/L (ref 38–126)
Anion gap: 18 — ABNORMAL HIGH (ref 5–15)
BUN: 124 mg/dL — ABNORMAL HIGH (ref 6–20)
CO2: 18 mmol/L — ABNORMAL LOW (ref 22–32)
Calcium: 7 mg/dL — ABNORMAL LOW (ref 8.9–10.3)
Chloride: 105 mmol/L (ref 98–111)
Creatinine, Ser: 25.91 mg/dL — ABNORMAL HIGH (ref 0.61–1.24)
GFR, Estimated: 2 mL/min — ABNORMAL LOW (ref >60)
Glucose, Bld: 118 mg/dL — ABNORMAL HIGH (ref 70–99)
Potassium: 4.1 mmol/L (ref 3.5–5.1)
Sodium: 141 mmol/L (ref 135–145)
Total Bilirubin: 0.7 mg/dL (ref 0.3–1.2)
Total Protein: 6 g/dL — ABNORMAL LOW (ref 6.5–8.1)

## 2021-02-28 LAB — CBC WITH DIFFERENTIAL/PLATELET
Abs Immature Granulocytes: 0.04 10*3/uL (ref 0.00–0.07)
Basophils Absolute: 0 10*3/uL (ref 0.0–0.1)
Basophils Relative: 0 %
Eosinophils Absolute: 0.2 10*3/uL (ref 0.0–0.5)
Eosinophils Relative: 3 %
HCT: 25.7 % — ABNORMAL LOW (ref 39.0–52.0)
Hemoglobin: 8.4 g/dL — ABNORMAL LOW (ref 13.0–17.0)
Immature Granulocytes: 1 %
Lymphocytes Relative: 21 %
Lymphs Abs: 1.3 10*3/uL (ref 0.7–4.0)
MCH: 26.9 pg (ref 26.0–34.0)
MCHC: 32.7 g/dL (ref 30.0–36.0)
MCV: 82.4 fL (ref 80.0–100.0)
Monocytes Absolute: 0.6 10*3/uL (ref 0.1–1.0)
Monocytes Relative: 9 %
Neutro Abs: 4.2 10*3/uL (ref 1.7–7.7)
Neutrophils Relative %: 66 %
Platelets: 291 10*3/uL (ref 150–400)
RBC: 3.12 MIL/uL — ABNORMAL LOW (ref 4.22–5.81)
RDW: 12.8 % (ref 11.5–15.5)
WBC: 6.3 10*3/uL (ref 4.0–10.5)
nRBC: 0 % (ref 0.0–0.2)

## 2021-02-28 LAB — IRON AND TIBC
Iron: 92 ug/dL (ref 45–182)
Saturation Ratios: 39 % (ref 17.9–39.5)
TIBC: 234 ug/dL — ABNORMAL LOW (ref 250–450)
UIBC: 142 ug/dL

## 2021-02-28 LAB — URINALYSIS, ROUTINE W REFLEX MICROSCOPIC
Bilirubin Urine: NEGATIVE
Glucose, UA: 150 mg/dL — AB
Ketones, ur: 5 mg/dL — AB
Leukocytes,Ua: NEGATIVE
Nitrite: NEGATIVE
Protein, ur: >=300 mg/dL — AB
Specific Gravity, Urine: 1.01 (ref 1.005–1.030)
pH: 6 (ref 5.0–8.0)

## 2021-02-28 LAB — GLUCOSE, CAPILLARY
Glucose-Capillary: 115 mg/dL — ABNORMAL HIGH (ref 70–99)
Glucose-Capillary: 128 mg/dL — ABNORMAL HIGH (ref 70–99)
Glucose-Capillary: 94 mg/dL (ref 70–99)
Glucose-Capillary: 94 mg/dL (ref 70–99)
Glucose-Capillary: 96 mg/dL (ref 70–99)
Glucose-Capillary: 99 mg/dL (ref 70–99)

## 2021-02-28 LAB — CK
Total CK: 1116 U/L — ABNORMAL HIGH (ref 49–397)
Total CK: 969 U/L — ABNORMAL HIGH (ref 49–397)

## 2021-02-28 LAB — BASIC METABOLIC PANEL
Anion gap: 19 — ABNORMAL HIGH (ref 5–15)
BUN: 126 mg/dL — ABNORMAL HIGH (ref 6–20)
CO2: 15 mmol/L — ABNORMAL LOW (ref 22–32)
Calcium: 7.1 mg/dL — ABNORMAL LOW (ref 8.9–10.3)
Chloride: 106 mmol/L (ref 98–111)
Creatinine, Ser: 26.08 mg/dL — ABNORMAL HIGH (ref 0.61–1.24)
Glucose, Bld: 102 mg/dL — ABNORMAL HIGH (ref 70–99)
Potassium: 4.9 mmol/L (ref 3.5–5.1)
Sodium: 140 mmol/L (ref 135–145)

## 2021-02-28 LAB — VITAMIN B12: Vitamin B-12: 966 pg/mL — ABNORMAL HIGH (ref 180–914)

## 2021-02-28 LAB — FERRITIN: Ferritin: 295 ng/mL (ref 24–336)

## 2021-02-28 LAB — LIPID PANEL
Cholesterol: 191 mg/dL (ref 0–200)
HDL: 28 mg/dL — ABNORMAL LOW (ref >40)
LDL Cholesterol: 124 mg/dL — ABNORMAL HIGH (ref 0–99)
Total CHOL/HDL Ratio: 6.8 RATIO
Triglycerides: 197 mg/dL — ABNORMAL HIGH (ref <150)
VLDL: 39 mg/dL (ref 0–40)

## 2021-02-28 LAB — HEPATITIS B SURFACE ANTIGEN: Hepatitis B Surface Ag: NONREACTIVE

## 2021-02-28 LAB — HIV ANTIBODY (ROUTINE TESTING W REFLEX): HIV Screen 4th Generation wRfx: NONREACTIVE

## 2021-02-28 LAB — SARS CORONAVIRUS 2 (TAT 6-24 HRS): SARS Coronavirus 2: NEGATIVE

## 2021-02-28 LAB — HEPATITIS C ANTIBODY: HCV Ab: NONREACTIVE

## 2021-02-28 LAB — RETICULOCYTES
Immature Retic Fract: 5.5 % (ref 2.3–15.9)
RBC.: 3.15 MIL/uL — ABNORMAL LOW (ref 4.22–5.81)
Retic Count, Absolute: 58.3 10*3/uL (ref 19.0–186.0)
Retic Ct Pct: 1.9 % (ref 0.4–3.1)

## 2021-02-28 LAB — TSH: TSH: 2.262 u[IU]/mL (ref 0.350–4.500)

## 2021-02-28 LAB — FOLATE: Folate: 15 ng/mL (ref >5.9)

## 2021-02-28 MED ORDER — CLONIDINE HCL 0.1 MG PO TABS
0.1000 mg | ORAL_TABLET | Freq: Once | ORAL | Status: AC
  Administered 2021-02-28: 0.1 mg via ORAL
  Filled 2021-02-28: qty 1

## 2021-02-28 MED ORDER — OXYCODONE HCL 5 MG PO TABS
5.0000 mg | ORAL_TABLET | Freq: Four times a day (QID) | ORAL | Status: DC | PRN
  Administered 2021-02-28 – 2021-03-11 (×13): 5 mg via ORAL
  Filled 2021-02-28 (×14): qty 1

## 2021-02-28 MED ORDER — SODIUM CHLORIDE 0.9 % IV SOLN: 100.0000 mL | INTRAVENOUS | Status: DC | PRN

## 2021-02-28 MED ORDER — ACETAMINOPHEN 325 MG PO TABS
ORAL_TABLET | ORAL | Status: AC
  Administered 2021-02-28: 650 mg via ORAL
  Filled 2021-02-28: qty 2

## 2021-02-28 MED ORDER — HEPARIN SODIUM (PORCINE) 5000 UNIT/ML IJ SOLN
5000.0000 [IU] | Freq: Three times a day (TID) | INTRAMUSCULAR | Status: DC
  Administered 2021-02-28 – 2021-03-02 (×8): 5000 [IU] via SUBCUTANEOUS
  Filled 2021-02-28 (×7): qty 1

## 2021-02-28 MED ORDER — HEPARIN SODIUM (PORCINE) 1000 UNIT/ML DIALYSIS: 20.0000 [IU]/kg | INTRAMUSCULAR | Status: DC | PRN

## 2021-02-28 MED ORDER — LABETALOL HCL 5 MG/ML IV SOLN
10.0000 mg | INTRAVENOUS | Status: DC | PRN
  Administered 2021-03-06 – 2021-03-12 (×6): 10 mg via INTRAVENOUS
  Filled 2021-02-28 (×8): qty 4

## 2021-02-28 MED ORDER — HEPARIN SODIUM (PORCINE) 1000 UNIT/ML IJ SOLN
INTRAMUSCULAR | Status: AC
  Administered 2021-02-28: 1000 [IU] via INTRAVENOUS_CENTRAL
  Filled 2021-02-28: qty 1

## 2021-02-28 MED ORDER — CHLORHEXIDINE GLUCONATE CLOTH 2 % EX PADS
6.0000 | MEDICATED_PAD | Freq: Every day | CUTANEOUS | Status: DC
  Administered 2021-02-28 – 2021-03-02 (×3): 6 via TOPICAL

## 2021-02-28 MED ORDER — FENTANYL CITRATE (PF) 100 MCG/2ML IJ SOLN
INTRAMUSCULAR | Status: AC
  Filled 2021-02-28: qty 2

## 2021-02-28 MED ORDER — CEFAZOLIN SODIUM-DEXTROSE 2-4 GM/100ML-% IV SOLN
2.0000 g | INTRAVENOUS | Status: AC
  Filled 2021-02-28: qty 100

## 2021-02-28 MED ORDER — HYDRALAZINE HCL 20 MG/ML IJ SOLN
20.0000 mg | INTRAMUSCULAR | Status: DC | PRN
  Administered 2021-03-01 – 2021-03-02 (×4): 20 mg via INTRAVENOUS
  Filled 2021-02-28 (×4): qty 1

## 2021-02-28 MED ORDER — CALCIUM GLUCONATE-NACL 1-0.675 GM/50ML-% IV SOLN
1.0000 g | Freq: Once | INTRAVENOUS | Status: AC
  Administered 2021-02-28: 1000 mg via INTRAVENOUS
  Filled 2021-02-28: qty 50

## 2021-02-28 MED ORDER — STERILE WATER FOR INJECTION IV SOLN
INTRAVENOUS | Status: DC
  Filled 2021-02-28 (×2): qty 1000

## 2021-02-28 MED ORDER — HYDRALAZINE HCL 20 MG/ML IJ SOLN
10.0000 mg | INTRAMUSCULAR | Status: DC | PRN
  Administered 2021-02-28 (×2): 10 mg via INTRAVENOUS
  Filled 2021-02-28 (×2): qty 1

## 2021-02-28 MED ORDER — AMLODIPINE BESYLATE 10 MG PO TABS
10.0000 mg | ORAL_TABLET | Freq: Every day | ORAL | Status: DC
  Administered 2021-02-28 – 2021-03-02 (×3): 10 mg via ORAL
  Filled 2021-02-28 (×2): qty 1

## 2021-02-28 MED ORDER — HEPARIN SODIUM (PORCINE) 1000 UNIT/ML IJ SOLN
INTRAMUSCULAR | Status: AC
  Administered 2021-02-28: 3.8 [IU] via INTRAVENOUS_CENTRAL
  Filled 2021-02-28: qty 1

## 2021-02-28 MED ORDER — FENTANYL CITRATE (PF) 100 MCG/2ML IJ SOLN
INTRAMUSCULAR | Status: AC
  Filled 2021-02-28: qty 4

## 2021-02-28 MED ORDER — SODIUM CHLORIDE 0.9 % IV SOLN: INTRAVENOUS | Status: DC

## 2021-02-28 MED ORDER — PENTAFLUOROPROP-TETRAFLUOROETH EX AERO: 1.0000 "application " | INHALATION_SPRAY | CUTANEOUS | Status: DC | PRN

## 2021-02-28 MED ORDER — FENTANYL CITRATE (PF) 100 MCG/2ML IJ SOLN
INTRAMUSCULAR | Status: AC | PRN
  Administered 2021-02-28 (×2): 50 ug via INTRAVENOUS

## 2021-02-28 MED ORDER — LIDOCAINE-PRILOCAINE 2.5-2.5 % EX CREA: 1.0000 "application " | TOPICAL_CREAM | CUTANEOUS | Status: DC | PRN

## 2021-02-28 MED ORDER — POLYVINYL ALCOHOL 1.4 % OP SOLN
1.0000 [drp] | OPHTHALMIC | Status: DC | PRN
  Filled 2021-02-28: qty 15

## 2021-02-28 MED ORDER — INSULIN ASPART 100 UNIT/ML IJ SOLN
0.0000 [IU] | Freq: Three times a day (TID) | INTRAMUSCULAR | Status: DC
  Administered 2021-03-01 – 2021-03-03 (×4): 1 [IU] via SUBCUTANEOUS
  Administered 2021-03-03: 2 [IU] via SUBCUTANEOUS
  Administered 2021-03-05: 1 [IU] via SUBCUTANEOUS
  Administered 2021-03-05: 3 [IU] via SUBCUTANEOUS
  Administered 2021-03-05 – 2021-03-06 (×4): 2 [IU] via SUBCUTANEOUS
  Administered 2021-03-07: 1 [IU] via SUBCUTANEOUS
  Administered 2021-03-07: 3 [IU] via SUBCUTANEOUS
  Administered 2021-03-08: 2 [IU] via SUBCUTANEOUS
  Administered 2021-03-08 – 2021-03-10 (×5): 1 [IU] via SUBCUTANEOUS
  Administered 2021-03-11: 2 [IU] via SUBCUTANEOUS
  Administered 2021-03-11 – 2021-03-12 (×3): 1 [IU] via SUBCUTANEOUS

## 2021-02-28 MED ORDER — ONDANSETRON HCL 4 MG/2ML IJ SOLN
4.0000 mg | Freq: Once | INTRAMUSCULAR | Status: AC
  Administered 2021-02-28: 4 mg via INTRAVENOUS
  Filled 2021-02-28: qty 2

## 2021-02-28 MED ORDER — ALTEPLASE 2 MG IJ SOLR: 2.0000 mg | Freq: Once | INTRAMUSCULAR | Status: DC | PRN

## 2021-02-28 MED ORDER — LIDOCAINE HCL 1 % IJ SOLN
INTRAMUSCULAR | Status: AC
  Administered 2021-02-28: 5 mL via SUBCUTANEOUS
  Filled 2021-02-28: qty 20

## 2021-02-28 MED ORDER — MIDAZOLAM HCL 2 MG/2ML IJ SOLN
INTRAMUSCULAR | Status: AC | PRN
  Administered 2021-02-28 (×3): 1 mg via INTRAVENOUS

## 2021-02-28 MED ORDER — CEFAZOLIN SODIUM-DEXTROSE 2-4 GM/100ML-% IV SOLN
INTRAVENOUS | Status: AC
  Administered 2021-02-28: 2 g via INTRAVENOUS
  Filled 2021-02-28: qty 100

## 2021-02-28 MED ORDER — LIDOCAINE HCL (PF) 1 % IJ SOLN: 5.0000 mL | INTRAMUSCULAR | Status: DC | PRN

## 2021-02-28 MED ORDER — FENTANYL CITRATE (PF) 100 MCG/2ML IJ SOLN
25.0000 ug | INTRAMUSCULAR | Status: DC | PRN
Start: 2021-02-28 — End: 2021-03-12
  Administered 2021-02-28: 25 ug via INTRAVENOUS
  Filled 2021-02-28: qty 2

## 2021-02-28 MED ORDER — MIDAZOLAM HCL 2 MG/2ML IJ SOLN
INTRAMUSCULAR | Status: AC
  Filled 2021-02-28: qty 4

## 2021-02-28 MED ORDER — HEPARIN SODIUM (PORCINE) 1000 UNIT/ML DIALYSIS: 1000.0000 [IU] | INTRAMUSCULAR | Status: DC | PRN

## 2021-02-28 MED ORDER — METOPROLOL SUCCINATE ER 50 MG PO TB24
50.0000 mg | ORAL_TABLET | Freq: Every day | ORAL | Status: DC
  Administered 2021-02-28 – 2021-03-01 (×2): 50 mg via ORAL
  Filled 2021-02-28 (×2): qty 1

## 2021-02-28 NOTE — Procedures (Signed)
Interventional Radiology Procedure Note  Procedure: Tunneled HD catheter  Indication: Renal failure  Findings: Please refer to procedural dictation for full description.  Complications: None  EBL: < 10 mL  Tyiesha Brackney, MD [phone]   

## 2021-02-28 NOTE — Progress Notes (Addendum)
Inpatient Diabetes Program Recommendations  AACE/ADA: New Consensus Statement on Inpatient Glycemic Control (2015)  Target Ranges:  Prepandial:   less than 140 mg/dL      Peak postprandial:   less than 180 mg/dL (1-2 hours)      Critically ill patients:  140 - 180 mg/dL   Lab Results  Component Value Date   GLUCAP 115 (H) 02/28/2021    Review of Glycemic Control Results for [name], Bobby [name] (MRN [id number]) as of 02/28/2021 12:00  Ref. Range 02/27/2021 18:20 02/27/2021 23:56 02/28/2021 06:51 02/28/2021 07:42 02/28/2021 11:40  Glucose-Capillary Latest Ref Range: 70 - 99 mg/dL 95 94 96 94 115 (H)   Diabetes history: T1DM? Outpatient Diabetes medications: Novolog 5 units TID Current orders for Inpatient glycemic control: None  Inpatient Diabetes Program Recommendations:    Referral received for uncontrolled DM.  Please consider:  Novolog 0-6 units Q4h. Has not required insulin so far this admission question if he truly has T1DM.  Will speak to patient.  Addendum @ 1500: Spoke with patient briefly.  He states he has T2DM since age 67.   Will continue to follow while inpatient.  Thank you, Reche Dixon, RN, BSN Diabetes Coordinator Inpatient Diabetes Program [phone] (team pager from 8a-5p)

## 2021-02-28 NOTE — Progress Notes (Addendum)
PROGRESS NOTE        PATIENT DETAILS Name: Bobby [name] Age: 25 y.o. Sex: male Date of Birth: 1996/06/24 Admit Date: 02/27/2021 Admitting Physician Reubin Milan, MD PCP:Pcp, No  Brief Narrative: Patient is a 25 y.o. male with PMHx DM-1, HTN, CKD stage IIIb-presented with nausea/vomiting/weakness/decreased appetite-found to have progressive CKD with worsening renal failure.  Significant events: 6/26>> admit-nausea/vomiting/weakness-progressive CKD with worsening renal failure.  Significant studies: 6/26>> CT abdomen/pelvis: No acute intra-abdominal process-no hydronephrosis.  Antimicrobial therapy: None  Microbiology data: 6/26>> COVID PCR: Negative  Procedures : None  Consults: Nephrology  DVT Prophylaxis : heparin injection 5,000 Units Start: 03/01/21 0600   Subjective: Some nausea-but otherwise comfortable overnight.  Acknowledges significant decrease in urine output over the past few days.   Assessment/Plan: Progressive renal failure/AKI superimposed on CKD stage IIID: Nephrology following-work-up-including serological/urine studies pending.  HD planned-suspect that this is probably diabetic/hypertensive nephropathy-watch for signs of renal recovery  Hypertensive urgency: BP remains uncontrolled-on amlodipine-increase to 10 mg morning by nephrology-on as needed IV hydralazine-suspect will improve post HD.    Nausea/vomiting: Likely related to uremia-should improve post HD.  Normocytic anemia: Due to CKD-follow closely-transfuse if significant drop in hemoglobin  Nontraumatic rhabdomyolysis: Likely clinically insignificant-follow for now.  Mildly prolonged QTC: Repeat EKG tomorrow morning.  Should improve once electrolytes are better.  DM-1: A1c pending-CBGs stable-start sensitive SSI and follow.  Recent Labs    02/28/21 0651 02/28/21 0742 02/28/21 1140  GLUCAP 96 94 115*     Obesity: Estimated body mass index is  33.41 kg/m as calculated from the following:   Height as of this encounter: '6\' 5"'$  (1.956 m).   Weight as of this encounter: 127.8 kg.   Diet: Diet Order             Diet NPO time specified  Diet effective now                    Code Status: Full code   Family Communication: None at bedside  Disposition Plan: Status is: Observation  The patient will require care spanning > 2 midnights and should be moved to inpatient because: Inpatient level of care appropriate due to severity of illness  Dispo: The patient is from: Home              Anticipated d/c is to: Home              Patient currently is not medically stable to d/c.   Difficult to place patient No   Barriers to Discharge: Worsening renal function-starting HD later today.  Antimicrobial agents: Anti-infectives (From admission, onward)    Start     Dose/Rate Route Frequency Ordered Stop   02/28/21 1030  ceFAZolin (ANCEF) IVPB 2g/100 mL premix        2 g 200 mL/hr over 30 Minutes Intravenous To Radiology 02/28/21 0943 03/01/21 1030        Time spent: 35 minutes-Greater than 50% of this time was spent in counseling, explanation of diagnosis, planning of further management, and coordination of care.  MEDICATIONS: Scheduled Meds:  amLODipine  10 mg Oral Daily   Chlorhexidine Gluconate Cloth  6 each Topical Q0600   [START ON 03/01/2021] heparin  5,000 Units Subcutaneous Q8H   Continuous Infusions:  sodium chloride 75 mL/hr at 02/28/21 0307    ceFAZolin (ANCEF) IV  sodium bicarbonate (isotonic) infusion in sterile water 75 mL/hr at 02/28/21 0317   PRN Meds:.acetaminophen **OR** acetaminophen, hydrALAZINE, polyvinyl alcohol   PHYSICAL EXAM: Vital signs: Vitals:   02/28/21 0740 02/28/21 0917 02/28/21 1142 02/28/21 1200  BP: (!) 192/102 (!) 223/113 (!) 214/113   Pulse:   (!) 101   Resp:   (!) 21 18  Temp:   98.1 F (36.7 C)   TempSrc:   Oral   SpO2:  99% 100%   Weight:      Height:        Filed Weights   02/27/21 2022 02/27/21 2238  Weight: 129.5 kg 127.8 kg   Body mass index is 33.41 kg/m.   Gen Exam:Alert awake-not in any distress HEENT:atraumatic, normocephalic Chest: B/L clear to auscultation anteriorly CVS:S1S2 regular Abdomen:soft non tender, non distended Extremities:no edema Neurology: Non focal Skin: no rash  I have personally reviewed following labs and imaging studies  LABORATORY DATA: CBC: Recent Labs  Lab 02/27/21 1825 02/28/21 0630  WBC 6.8 6.3  NEUTROABS 4.5 4.2  HGB 9.6* 8.4*  HCT 28.8* 25.7*  MCV 80.9 82.4  PLT 333 [id number]    Basic Metabolic Panel: Recent Labs  Lab 02/27/21 1825 02/28/21 0005 02/28/21 0630  NA 140 140 141  K 3.8 4.9 4.1  CL 102 106 105  CO2 18* 15* 18*  GLUCOSE 107* 102* 118*  BUN 121* 126* 124*  CREATININE 25.39* 26.08* 25.91*  CALCIUM 7.0* 7.1* 7.0*    GFR: Estimated Creatinine Clearance: 6.4 mL/min (A) (by C-G formula based on SCr of 25.91 mg/dL (H)).  Liver Function Tests: Recent Labs  Lab 02/27/21 1825 02/28/21 0630  AST 16 14*  ALT 15 15  ALKPHOS 103 93  BILITOT 0.5 0.7  PROT 7.0 6.0*  ALBUMIN 3.1* 2.7*   Recent Labs  Lab 02/27/21 1825  LIPASE 64*   No results for input(s): AMMONIA in the last 168 hours.  Coagulation Profile: No results for input(s): INR, PROTIME in the last 168 hours.  Cardiac Enzymes: Recent Labs  Lab 02/28/21 0005 02/28/21 0630  CKTOTAL 1,116* 969*    BNP (last 3 results) No results for input(s): PROBNP in the last 8760 hours.  Lipid Profile: Recent Labs    02/28/21 0630  CHOL 191  HDL 28*  LDLCALC 124*  TRIG 197*  CHOLHDL 6.8    Thyroid Function Tests: Recent Labs    02/28/21 0630  TSH 2.262    Anemia Panel: Recent Labs    02/28/21 0630  VITAMINB12 966*  FOLATE 15.0  FERRITIN 295  TIBC 234*  IRON 92  RETICCTPCT 1.9    Urine analysis: No results found for: COLORURINE, APPEARANCEUR, LABSPEC, PHURINE, GLUCOSEU, HGBUR, BILIRUBINUR,  KETONESUR, PROTEINUR, UROBILINOGEN, NITRITE, LEUKOCYTESUR  Sepsis Labs: Lactic Acid, Venous No results found for: LATICACIDVEN  MICROBIOLOGY: Recent Results (from the past 240 hour(s))  SARS CORONAVIRUS 2 (TAT 6-24 HRS) Nasopharyngeal Nasopharyngeal Swab     Status: None   Collection Time: 02/27/21  7:42 PM   Specimen: Nasopharyngeal Swab  Result Value Ref Range Status   SARS Coronavirus 2 NEGATIVE NEGATIVE Final    Comment: (NOTE) SARS-CoV-2 target nucleic acids are NOT DETECTED.  The SARS-CoV-2 RNA is generally detectable in upper and lower respiratory specimens during the acute phase of infection. Negative results do not preclude SARS-CoV-2 infection, do not rule out co-infections with other pathogens, and should not be used as the sole basis for treatment or other patient management decisions. Negative results must be combined  with clinical observations, patient history, and epidemiological information. The expected result is Negative.  Fact Sheet for Patients: SugarRoll.be  Fact Sheet for Healthcare Providers: https://www.woods-mathews.com/  This test is not yet approved or cleared by the [place] FDA and  has been authorized for detection and/or diagnosis of SARS-CoV-2 by FDA under an Emergency Use Authorization (EUA). This EUA will remain  in effect (meaning this test can be used) for the duration of the COVID-19 declaration under Se ction 564(b)(1) of the Act, 21 U.S.C. section 360bbb-3(b)(1), unless the authorization is terminated or revoked sooner.  Performed at [facility], [place] [address], [place], [place] [address]     RADIOLOGY STUDIES/RESULTS: CT ABDOMEN PELVIS WO CONTRAST  Result Date: 02/27/2021 CLINICAL DATA:  Nausea, weakness and loss of appetite x1 week. EXAM: CT ABDOMEN AND PELVIS WITHOUT CONTRAST TECHNIQUE: Multidetector CT imaging of the abdomen and pelvis was performed following the standard  protocol without IV contrast. COMPARISON:  None. FINDINGS: Lower chest: Mild atelectasis is seen within the bilateral lung bases. Hepatobiliary: No focal liver abnormality is seen. No gallstones, gallbladder wall thickening, or biliary dilatation. Pancreas: Unremarkable. No pancreatic ductal dilatation or surrounding inflammatory changes. Spleen: Normal in size without focal abnormality. Adrenals/Urinary Tract: Adrenal glands are unremarkable. Kidneys are normal, without renal calculi, focal lesion, or hydronephrosis. Bladder is unremarkable. Stomach/Bowel: There is a small hiatal hernia. Appendix appears normal. No evidence of bowel wall thickening, distention, or inflammatory changes. Vascular/Lymphatic: No significant vascular findings are present. No enlarged abdominal or pelvic lymph nodes. Reproductive: Prostate is unremarkable. Other: No abdominal wall hernia or abnormality. No abdominopelvic ascites. Musculoskeletal: No acute or significant osseous findings. IMPRESSION: 1. Small hiatal hernia. 2. No acute or active process within the abdomen or pelvis. Electronically Signed   By: Virgina Norfolk M.D.   On: 02/27/2021 21:00     LOS: 0 days   Oren Binet, MD  [facility]    To contact the attending provider between 7A-7P or the covering provider during after hours 7P-7A, please log into the web site [email or web address] and access using universal [place] password for that web site. If you do not have the password, please call the hospital operator.  02/28/2021, 12:22 PM

## 2021-02-28 NOTE — Progress Notes (Signed)
Patient refused foley catheter and will not use a urinal, neither the nurse or tech has witnessed him void, pt has walked to the bathroom two times, I was unable to obtain a urine specimen this shift.

## 2021-02-28 NOTE — Consult Note (Signed)
[facility] Nephrology Consultation Note  Requesting MD: Dr Oren Binet Reason for consult: AKI on CKD  HPI:  Bobby [name] is a 25 y.o. male with history of type 1 diabetes since very young age, uncontrolled with retinopathy, uncontrolled hypertension on metoprolol, CKD 3 and was followed by nephrologist in [facility] with creatinine level 2.10 in 09/2019, presented with severe nausea, vomiting, decreased appetite and weakness for about 6 weeks, seen as a consultation for the management of advanced renal failure. Reportedly patient has uncontrolled diabetes and hypertension for a long time.  He was on metoprolol and insulin only.  He was seen by nephrologist Dr. Olivia Mackie  in 11/2019 for CKD 3.  It sounds like he lost follow-up since then.  He is now presenting to the ER with about 6 weeks of generally not feeling well associated with nausea, anorexia, vomiting.  Yesterday the symptoms was very bad which triggered him to go to the ER.  He was also having some blurry vision. In the ER, his blood pressure was around [id number] systolic, in room air.  The labs showed BUN 121, creatinine level 25.39, CO2 18, sodium 140, potassium 3.8, albumin 3.1, CK 1116, hemoglobin 9.6, platelets 333.  The CT scan of abdomen pelvis showed unremarkable kidneys without any hydronephrosis.  He was started on sodium bicarbonate IV fluid and admitted for further management.  This morning the creatinine level 25.91 and BUN high 24.  He reports a small amount of urine output early this morning which is recorded around 300 cc. In the hospital he is on amlodipine, hydralazine and IVF. He continues to have some nausea, generalized weakness and some shortness of breath.  Denies chest pain or vomiting.  Denies use of NSAIDs.  He lives with his sister.  Not working.  No significant family history of kidney disease.  Creatinine, Ser  Date/Time Value Ref Range Status  02/28/2021 06:30 AM 25.91 (H) 0.61 - 1.24 mg/dL  Final    Comment:    RESULTS CONFIRMED BY MANUAL DILUTION  02/28/2021 12:05 AM 26.08 (H) 0.61 - 1.24 mg/dL Final    Comment:    RESULTS CONFIRMED BY MANUAL DILUTION  02/27/2021 06:25 PM 25.39 (H) 0.61 - 1.24 mg/dL Final    Comment:    RESULTS CONFIRMED BY MANUAL DILUTION     PMHx:   Past Medical History:  Diagnosis Date   Diabetes mellitus without complication ([place])    Hypertension     History reviewed. No pertinent surgical history.  Family Hx:  Family History  Problem Relation Age of Onset   Diabetes Mother    Kidney disease Other    Hypertension Other     Social History:  reports current drug use. Drug: Marijuana. He reports that he does not drink alcohol. No history on file for tobacco use.  Allergies: No Known Allergies  Medications: Prior to Admission medications   Medication Sig Start Date End Date Taking? Authorizing Provider  insulin aspart (NOVOLOG) 100 UNIT/ML injection Inject into the skin 3 (three) times daily before meals.   Yes [provider]  metoprolol succinate (TOPROL-XL) 50 MG 24 hr tablet Take 50 mg by mouth daily. Take with or immediately following a meal.   Yes [provider]    I have reviewed the patient's current medications.  Labs:  Results for orders placed or performed during the hospital encounter of 02/27/21 (from the past 48 hour(s))  POC CBG, ED     Status: None   Collection Time:  02/27/21  6:20 PM  Result Value Ref Range   Glucose-Capillary 95 70 - 99 mg/dL    Comment: Glucose reference range applies only to samples taken after fasting for at least 8 hours.  CBC with Differential     Status: Abnormal   Collection Time: 02/27/21  6:25 PM  Result Value Ref Range   WBC 6.8 4.0 - 10.5 K/uL   RBC 3.56 (L) 4.22 - 5.81 MIL/uL   Hemoglobin 9.6 (L) 13.0 - 17.0 g/dL   HCT 28.8 (L) 39.0 - 52.0 %   MCV 80.9 80.0 - 100.0 fL   MCH 27.0 26.0 - 34.0 pg   MCHC 33.3 30.0 - 36.0 g/dL   RDW 12.8 11.5 - 15.5 %   Platelets  333 150 - 400 K/uL   nRBC 0.0 0.0 - 0.2 %   Neutrophils Relative % 67 %   Neutro Abs 4.5 1.7 - 7.7 K/uL   Lymphocytes Relative 21 %   Lymphs Abs 1.5 0.7 - 4.0 K/uL   Monocytes Relative 9 %   Monocytes Absolute 0.6 0.1 - 1.0 K/uL   Eosinophils Relative 3 %   Eosinophils Absolute 0.2 0.0 - 0.5 K/uL   Basophils Relative 0 %   Basophils Absolute 0.0 0.0 - 0.1 K/uL   Immature Granulocytes 0 %   Abs Immature Granulocytes 0.01 0.00 - 0.07 K/uL    Comment: Performed at [facility], [address] [place], [place] [address]  Comprehensive metabolic panel     Status: Abnormal   Collection Time: 02/27/21  6:25 PM  Result Value Ref Range   Sodium 140 135 - 145 mmol/L   Potassium 3.8 3.5 - 5.1 mmol/L   Chloride 102 98 - 111 mmol/L   CO2 18 (L) 22 - 32 mmol/L   Glucose, Bld 107 (H) 70 - 99 mg/dL    Comment: Glucose reference range applies only to samples taken after fasting for at least 8 hours.   BUN 121 (H) 6 - 20 mg/dL    Comment: RESULTS CONFIRMED BY MANUAL DILUTION   Creatinine, Ser 25.39 (H) 0.61 - 1.24 mg/dL    Comment: RESULTS CONFIRMED BY MANUAL DILUTION   Calcium 7.0 (L) 8.9 - 10.3 mg/dL   Total Protein 7.0 6.5 - 8.1 g/dL   Albumin 3.1 (L) 3.5 - 5.0 g/dL   AST 16 15 - 41 U/L   ALT 15 0 - 44 U/L   Alkaline Phosphatase 103 38 - 126 U/L   Total Bilirubin 0.5 0.3 - 1.2 mg/dL   GFR, Estimated 2 (L) >60 mL/min    Comment: (NOTE) Calculated using the CKD-EPI Creatinine Equation (2021)    Anion gap 20 (H) 5 - 15    Comment: Performed at [facility], [address] [place], [place] [address]  Lipase, blood     Status: Abnormal   Collection Time: 02/27/21  6:25 PM  Result Value Ref Range   Lipase 64 (H) 11 - 51 U/L    Comment: Performed at [facility], [address] [place], [place] [address]  SARS CORONAVIRUS 2 (TAT 6-24 HRS) Nasopharyngeal Nasopharyngeal Swab     Status: None   Collection Time: 02/27/21  7:42 PM   Specimen: Nasopharyngeal  Swab  Result Value Ref Range   SARS Coronavirus 2 NEGATIVE NEGATIVE    Comment: (NOTE) SARS-CoV-2 target nucleic acids are NOT DETECTED.  The SARS-CoV-2 RNA is generally detectable in upper and lower respiratory specimens  during the acute phase of infection. Negative results do not preclude SARS-CoV-2 infection, do not rule out co-infections with other pathogens, and should not be used as the sole basis for treatment or other patient management decisions. Negative results must be combined with clinical observations, patient history, and epidemiological information. The expected result is Negative.  Fact Sheet for Patients: SugarRoll.be  Fact Sheet for Healthcare Providers: https://www.woods-mathews.com/  This test is not yet approved or cleared by the [place] FDA and  has been authorized for detection and/or diagnosis of SARS-CoV-2 by FDA under an Emergency Use Authorization (EUA). This EUA will remain  in effect (meaning this test can be used) for the duration of the COVID-19 declaration under Se ction 564(b)(1) of the Act, 21 U.S.C. section 360bbb-3(b)(1), unless the authorization is terminated or revoked sooner.  Performed at [facility], [place] [address] [place], [place] [address]   Glucose, capillary     Status: None   Collection Time: 02/27/21 11:56 PM  Result Value Ref Range   Glucose-Capillary 94 70 - 99 mg/dL    Comment: Glucose reference range applies only to samples taken after fasting for at least 8 hours.  Basic metabolic panel     Status: Abnormal   Collection Time: 02/28/21 12:05 AM  Result Value Ref Range   Sodium 140 135 - 145 mmol/L   Potassium 4.9 3.5 - 5.1 mmol/L    Comment: SLIGHT HEMOLYSIS   Chloride 106 98 - 111 mmol/L   CO2 15 (L) 22 - 32 mmol/L   Glucose, Bld 102 (H) 70 - 99 mg/dL    Comment: Glucose reference range applies only to samples taken after fasting for at least 8 hours.   BUN 126 (H)  6 - 20 mg/dL   Creatinine, Ser 26.08 (H) 0.61 - 1.24 mg/dL    Comment: RESULTS CONFIRMED BY MANUAL DILUTION   Calcium 7.1 (L) 8.9 - 10.3 mg/dL   GFR, Estimated NOT CALCULATED >60 mL/min    Comment: (NOTE) Calculated using the CKD-EPI Creatinine Equation (2021)    Anion gap 19 (H) 5 - 15    Comment: Performed at [facility] [address] [place], [place] [address]  CK     Status: Abnormal   Collection Time: 02/28/21 12:05 AM  Result Value Ref Range   Total CK 1,116 (H) 49 - 397 U/L    Comment: Performed at [facility], [place] [address] [place], [place] [address]  CBC WITH DIFFERENTIAL     Status: Abnormal   Collection Time: 02/28/21  6:30 AM  Result Value Ref Range   WBC 6.3 4.0 - 10.5 K/uL   RBC 3.12 (L) 4.22 - 5.81 MIL/uL   Hemoglobin 8.4 (L) 13.0 - 17.0 g/dL   HCT 25.7 (L) 39.0 - 52.0 %   MCV 82.4 80.0 - 100.0 fL   MCH 26.9 26.0 - 34.0 pg   MCHC 32.7 30.0 - 36.0 g/dL   RDW 12.8 11.5 - 15.5 %   Platelets 291 150 - 400 K/uL   nRBC 0.0 0.0 - 0.2 %   Neutrophils Relative % 66 %   Neutro Abs 4.2 1.7 - 7.7 K/uL   Lymphocytes Relative 21 %   Lymphs Abs 1.3 0.7 - 4.0 K/uL   Monocytes Relative 9 %   Monocytes Absolute 0.6 0.1 - 1.0 K/uL   Eosinophils Relative 3 %   Eosinophils Absolute 0.2 0.0 - 0.5 K/uL   Basophils Relative 0 %  Basophils Absolute 0.0 0.0 - 0.1 K/uL   Immature Granulocytes 1 %   Abs Immature Granulocytes 0.04 0.00 - 0.07 K/uL    Comment: Performed at [facility], [place] [address], [place], [place] [address]  Comprehensive metabolic panel     Status: Abnormal   Collection Time: 02/28/21  6:30 AM  Result Value Ref Range   Sodium 141 135 - 145 mmol/L   Potassium 4.1 3.5 - 5.1 mmol/L   Chloride 105 98 - 111 mmol/L   CO2 18 (L) 22 - 32 mmol/L   Glucose, Bld 118 (H) 70 - 99 mg/dL    Comment: Glucose reference range applies only to samples taken after fasting for at least 8 hours.   BUN 124 (H) 6 - 20 mg/dL   Creatinine, Ser 25.91 (H) 0.61  - 1.24 mg/dL    Comment: RESULTS CONFIRMED BY MANUAL DILUTION   Calcium 7.0 (L) 8.9 - 10.3 mg/dL   Total Protein 6.0 (L) 6.5 - 8.1 g/dL   Albumin 2.7 (L) 3.5 - 5.0 g/dL   AST 14 (L) 15 - 41 U/L   ALT 15 0 - 44 U/L   Alkaline Phosphatase 93 38 - 126 U/L   Total Bilirubin 0.7 0.3 - 1.2 mg/dL   GFR, Estimated 2 (L) >60 mL/min    Comment: (NOTE) Calculated using the CKD-EPI Creatinine Equation (2021)    Anion gap 18 (H) 5 - 15    Comment: Performed at [facility], [place] [address], [place], [place] [address]  Lipid panel     Status: Abnormal   Collection Time: 02/28/21  6:30 AM  Result Value Ref Range   Cholesterol 191 0 - 200 mg/dL   Triglycerides 197 (H) <150 mg/dL   HDL 28 (L) >40 mg/dL   Total CHOL/HDL Ratio 6.8 RATIO   VLDL 39 0 - 40 mg/dL   LDL Cholesterol 124 (H) 0 - 99 mg/dL    Comment:        Total Cholesterol/HDL:CHD Risk Coronary Heart Disease Risk Table                     Men   Women  1/2 Average Risk   3.4   3.3  Average Risk       5.0   4.4  2 X Average Risk   9.6   7.1  3 X Average Risk  23.4   11.0        Use the calculated Patient Ratio above and the CHD Risk Table to determine the patient's CHD Risk.        ATP III CLASSIFICATION (LDL):  <100     mg/dL   Optimal  100-129  mg/dL   Near or Above                    Optimal  130-159  mg/dL   Borderline  160-189  mg/dL   High  >190     mg/dL   Very High Performed at [facility] [address] [place], [place] [address]   CK     Status: Abnormal   Collection Time: 02/28/21  6:30 AM  Result Value Ref Range   Total CK 969 (H) 49 - 397 U/L    Comment: Performed at [facility], [place] [address] [place], [place] [address]  Vitamin B12     Status: Abnormal   Collection Time: 02/28/21  6:30 AM  Result  Value Ref Range   Vitamin B-12 966 (H) 180 - 914 pg/mL    Comment: (NOTE) This assay is not validated for testing neonatal or myeloproliferative syndrome specimens for Vitamin B12  levels. Performed at [facility], [place] [address] [place], [place] [address]   Folate     Status: None   Collection Time: 02/28/21  6:30 AM  Result Value Ref Range   Folate 15.0 >5.9 ng/mL    Comment: Performed at [facility] [address], [place], [place] [address]  Iron and TIBC     Status: Abnormal   Collection Time: 02/28/21  6:30 AM  Result Value Ref Range   Iron 92 45 - 182 ug/dL   TIBC 234 (L) 250 - 450 ug/dL   Saturation Ratios 39 17.9 - 39.5 %   UIBC 142 ug/dL    Comment: Performed at [facility], [place] [address], [place], [place] [address]  Ferritin     Status: None   Collection Time: 02/28/21  6:30 AM  Result Value Ref Range   Ferritin 295 24 - 336 ng/mL    Comment: Performed at [facility], [place] [address] [place], [place] [address]  Reticulocytes     Status: Abnormal   Collection Time: 02/28/21  6:30 AM  Result Value Ref Range   Retic Ct Pct 1.9 0.4 - 3.1 %   RBC. 3.15 (L) 4.22 - 5.81 MIL/uL   Retic Count, Absolute 58.3 19.0 - 186.0 K/uL   Immature Retic Fract 5.5 2.3 - 15.9 %    Comment: Performed at [facility] [address], [place], [place] [address]  TSH     Status: None   Collection Time: 02/28/21  6:30 AM  Result Value Ref Range   TSH 2.262 0.350 - 4.500 uIU/mL    Comment: Performed by a 3rd Generation assay with a functional sensitivity of <=0.01 uIU/mL. Performed at [facility], [place] [address], [place], [place] [address]   Glucose, capillary     Status: None   Collection Time: 02/28/21  6:51 AM  Result Value Ref Range   Glucose-Capillary 96 70 - 99 mg/dL    Comment: Glucose reference range applies only to samples taken after fasting for at least 8 hours.  Glucose, capillary     Status: None   Collection Time: 02/28/21  7:42 AM  Result Value Ref Range   Glucose-Capillary 94 70 - 99 mg/dL    Comment: Glucose reference range applies only to samples taken after fasting for at least 8 hours.     ROS: As  per H&P.  Other comprehensive systems reviewed and are negative.   Physical Exam: Vitals:   02/28/21 0606 02/28/21 0740  BP: (!) 203/128 (!) 192/102  Pulse: 100   Resp: 18   Temp: 98.1 F (36.7 C)   SpO2: 100%      General exam: Appears calm and comfortable  Respiratory system: Clear to auscultation. Respiratory effort normal. No wheezing or crackle Cardiovascular system: S1 & S2 heard, RRR. No LE edema. Gastrointestinal system: Abdomen is nondistended, soft and nontender. Normal bowel sounds heard. Central nervous system: Alert and oriented. No focal neurological deficits. Extremities: Symmetric 5 x 5 power. Skin: No rashes, lesions or ulcers Psychiatry: Judgement and insight appear normal. Mood & affect appropriate.   Assessment/Plan:  #Advanced renal failure most likely progressive CKD in the setting of uncontrolled diabetes and hypertension versus AKI on CKD stage III.  The creatinine level was around 2 back in 2021 and was seen by nephrologist in [facility].  Now he presents with around 6 weeks of uremic symptoms with creatinine level peaked at 26.  He is oliguric.  The CT scan ruled out hydronephrosis/obstruction. Checking urinalysis, urine lites. I will check basic serology including ANA, complements, ANCA, hep B, hep C, HIV to complete the work-up. I will check CKD labs including PTH, iron studies. Given uremic symptoms, oliguria he will need initiation of dialysis.  I have discussed about dialysis including placement of catheter with the patient and he agreed with the plan.  IR consulted for tunneled HD catheter placement.  Plan for HD today and tomorrow. Continue to watch for signs of renal recovery.  Avoid nephrotoxins, IV contrast.  #Hypertensive urgency with renal failure: Increase amlodipine to 10 mg.  Monitor BP.  IV hydralazine as needed.  #Metabolic acidosis with CKD: Now managed with dialysis.  #Anemia of chronic kidney disease: Iron saturation 39% with serum iron  92.  I will order IV iron and ESA.  #CKD MBD: Check PTH, phosphorus level.  Thank you for the consult.  We will follow with you.   Velton Roselle Tanna Furry 02/28/2021, 8:57 AM  [facility].

## 2021-02-28 NOTE — Consult Note (Signed)
Chief Complaint: Patient was seen in consultation today for tunneled dialysis catheter placement Chief Complaint  Patient presents with   Weakness   Blood Sugar Problem   at the request of Dr [name]   Supervising Physician: [name]  Patient Status: [facility] - Out-pt  History of Present Illness: Bobby [name] is a 25 y.o. male  Pt has known CKD Follows in [facility] Uncontrolled DM; uncontrolled HTN Came to ED with N/V; weakness x 6 weeks Advancing renal failure Decreasing UOP- uremic Nephrology consulted-- Dr [name] Cr 25.3---26.08---25.91  Requesting tunneled hemodialysis catheter to initiate dialysis asap.  Scheduled for procedure today in IR   Past Medical History:  Diagnosis Date   Diabetes mellitus without complication ([place])    Hypertension     History reviewed. No pertinent surgical history.  Allergies: Patient has no known allergies.  Medications: Prior to Admission medications   Medication Sig Start Date End Date Taking? Authorizing Provider  insulin aspart (NOVOLOG) 100 UNIT/ML injection Inject into the skin 3 (three) times daily before meals.   Yes [provider]  metoprolol succinate (TOPROL-XL) 50 MG 24 hr tablet Take 50 mg by mouth daily. Take with or immediately following a meal.   Yes [provider]     Family History  Problem Relation Age of Onset   Diabetes Mother    Kidney disease Other    Hypertension Other     Social History   Socioeconomic History   Marital status: Single    Spouse name: Not on file   Number of children: Not on file   Years of education: Not on file   Highest education level: Not on file  Occupational History   Not on file  Tobacco Use   Smoking status: Never   Smokeless tobacco: Not on file  Substance and Sexual Activity   Alcohol use: Never   Drug use: Yes    Types: Marijuana    Comment: daily   Sexual activity: Not on file  Other Topics Concern   Not on file   Social History Narrative   Not on file   Social Determinants of Health   Financial Resource Strain: Not on file  Food Insecurity: Not on file  Transportation Needs: Not on file  Physical Activity: Not on file  Stress: Not on file  Social Connections: Not on file    Review of Systems: A 12 point ROS discussed and pertinent positives are indicated in the HPI above.  All other systems are negative.  Review of Systems  Constitutional:  Positive for activity change and appetite change. Negative for fever.  Respiratory:  Positive for shortness of breath. Negative for cough.   Cardiovascular:  Negative for chest pain.  Gastrointestinal:  Positive for abdominal pain, nausea and vomiting.  Musculoskeletal:  Negative for back pain.  Neurological:  Positive for weakness.  Psychiatric/Behavioral:  Negative for behavioral problems and confusion.    Vital Signs: BP (!) 223/113 (BP Location: Right Arm)   Pulse 100   Temp 98.1 F (36.7 C) (Oral)   Resp 18   Ht '6\' 5"'$  (1.956 m)   Wt 281 lb 12 oz (127.8 kg)   SpO2 99%   BMI 33.41 kg/m   Physical Exam Vitals reviewed.  HENT:     Mouth/Throat:     Mouth: Mucous membranes are moist.  Cardiovascular:     Rate and Rhythm: Normal rate and regular rhythm.     Heart sounds: Normal heart sounds.  Pulmonary:  Effort: Pulmonary effort is normal.     Breath sounds: Normal breath sounds.  Abdominal:     Palpations: Abdomen is soft.  Musculoskeletal:        General: Normal range of motion.     Right lower leg: No edema.     Left lower leg: No edema.  Skin:    General: Skin is warm.  Neurological:     Mental Status: He is alert and oriented to person, place, and time.  Psychiatric:        Behavior: Behavior normal.    Imaging: CT ABDOMEN PELVIS WO CONTRAST  Result Date: 02/27/2021 CLINICAL DATA:  Nausea, weakness and loss of appetite x1 week. EXAM: CT ABDOMEN AND PELVIS WITHOUT CONTRAST TECHNIQUE: Multidetector CT imaging of the  abdomen and pelvis was performed following the standard protocol without IV contrast. COMPARISON:  None. FINDINGS: Lower chest: Mild atelectasis is seen within the bilateral lung bases. Hepatobiliary: No focal liver abnormality is seen. No gallstones, gallbladder wall thickening, or biliary dilatation. Pancreas: Unremarkable. No pancreatic ductal dilatation or surrounding inflammatory changes. Spleen: Normal in size without focal abnormality. Adrenals/Urinary Tract: Adrenal glands are unremarkable. Kidneys are normal, without renal calculi, focal lesion, or hydronephrosis. Bladder is unremarkable. Stomach/Bowel: There is a small hiatal hernia. Appendix appears normal. No evidence of bowel wall thickening, distention, or inflammatory changes. Vascular/Lymphatic: No significant vascular findings are present. No enlarged abdominal or pelvic lymph nodes. Reproductive: Prostate is unremarkable. Other: No abdominal wall hernia or abnormality. No abdominopelvic ascites. Musculoskeletal: No acute or significant osseous findings. IMPRESSION: 1. Small hiatal hernia. 2. No acute or active process within the abdomen or pelvis. Electronically Signed   By: Virgina Norfolk M.D.   On: 02/27/2021 21:00    Labs:  CBC: Recent Labs    02/27/21 1825 02/28/21 0630  WBC 6.8 6.3  HGB 9.6* 8.4*  HCT 28.8* 25.7*  PLT 333 291    COAGS: No results for input(s): INR, APTT in the last 8760 hours.  BMP: Recent Labs    02/27/21 1825 02/28/21 0005 02/28/21 0630  NA 140 140 141  K 3.8 4.9 4.1  CL 102 106 105  CO2 18* 15* 18*  GLUCOSE 107* 102* 118*  BUN 121* 126* 124*  CALCIUM 7.0* 7.1* 7.0*  CREATININE 25.39* 26.08* 25.91*  GFRNONAA 2* NOT CALCULATED 2*    LIVER FUNCTION TESTS: Recent Labs    02/27/21 1825 02/28/21 0630  BILITOT 0.5 0.7  AST 16 14*  ALT 15 15  ALKPHOS 103 93  PROT 7.0 6.0*  ALBUMIN 3.1* 2.7*    TUMOR MARKERS: No results for input(s): AFPTM, CEA, CA199, CHROMGRNA in the last 8760  hours.  Assessment and Plan:  CKD Progressive renal failure HTN urgency Uncontrolled DM Uremic symptoms To initiate dialysis asap per Nephrology Scheduled for tunneled dialysis catheter placement in IR Risks and benefits discussed with the patient including, but not limited to bleeding, infection, vascular injury, pneumothorax which may require chest tube placement, air embolism or even death  All of the patient's questions were answered, patient is agreeable to proceed. Consent signed and in chart.   Thank you for this interesting consult.  I greatly enjoyed meeting Bravin Helman and look forward to participating in their care.  A copy of this report was sent to the requesting provider on this date.  Electronically Signed: Lavonia Drafts, PA-C 02/28/2021, 9:34 AM   I spent a total of 20 Minutes    in face to face in clinical  consultation, greater than 50% of which was counseling/coordinating care for tunneled HD catheter placement

## 2021-03-01 ENCOUNTER — Inpatient Hospital Stay (HOSPITAL_COMMUNITY): Payer: Medicaid Other

## 2021-03-01 DIAGNOSIS — N186 End stage renal disease: Secondary | ICD-10-CM

## 2021-03-01 LAB — ENA+DNA/DS+ANTICH+CENTRO+JO...
Anti JO-1: <0.2 AI (ref 0.0–0.9)
Centromere Ab Screen: <0.2 AI (ref 0.0–0.9)
Chromatin Ab SerPl-aCnc: <0.2 AI (ref 0.0–0.9)
ENA SM Ab Ser-aCnc: <0.2 AI (ref 0.0–0.9)
Ribonucleic Protein: 1.9 AI — ABNORMAL HIGH (ref 0.0–0.9)
SSA (Ro) (ENA) Antibody, IgG: <0.2 AI (ref 0.0–0.9)
SSB (La) (ENA) Antibody, IgG: <0.2 AI (ref 0.0–0.9)
Scleroderma (Scl-70) (ENA) Antibody, IgG: <0.2 AI (ref 0.0–0.9)
ds DNA Ab: <1 IU/mL (ref 0–9)

## 2021-03-01 LAB — URINALYSIS, ROUTINE W REFLEX MICROSCOPIC
Bacteria, UA: NONE SEEN
Bilirubin Urine: NEGATIVE
Glucose, UA: 150 mg/dL — AB
Ketones, ur: 5 mg/dL — AB
Leukocytes,Ua: NEGATIVE
Nitrite: NEGATIVE
Protein, ur: >=300 mg/dL — AB
Specific Gravity, Urine: 1.01 (ref 1.005–1.030)
pH: 7 (ref 5.0–8.0)

## 2021-03-01 LAB — ANCA TITERS
Atypical P-ANCA titer: <1:20 {titer}
C-ANCA: <1:20 {titer}
P-ANCA: <1:20 {titer}

## 2021-03-01 LAB — RENAL FUNCTION PANEL
Albumin: 2.5 g/dL — ABNORMAL LOW (ref 3.5–5.0)
Anion gap: 16 — ABNORMAL HIGH (ref 5–15)
BUN: 92 mg/dL — ABNORMAL HIGH (ref 6–20)
CO2: 22 mmol/L (ref 22–32)
Calcium: 7.2 mg/dL — ABNORMAL LOW (ref 8.9–10.3)
Chloride: 100 mmol/L (ref 98–111)
Creatinine, Ser: 21.02 mg/dL — ABNORMAL HIGH (ref 0.61–1.24)
GFR, Estimated: 3 mL/min — ABNORMAL LOW (ref >60)
Glucose, Bld: 133 mg/dL — ABNORMAL HIGH (ref 70–99)
Phosphorus: 7.1 mg/dL — ABNORMAL HIGH (ref 2.5–4.6)
Potassium: 3.9 mmol/L (ref 3.5–5.1)
Sodium: 138 mmol/L (ref 135–145)

## 2021-03-01 LAB — CBC
HCT: 26.9 % — ABNORMAL LOW (ref 39.0–52.0)
Hemoglobin: 9 g/dL — ABNORMAL LOW (ref 13.0–17.0)
MCH: 27.4 pg (ref 26.0–34.0)
MCHC: 33.5 g/dL (ref 30.0–36.0)
MCV: 82 fL (ref 80.0–100.0)
Platelets: 279 10*3/uL (ref 150–400)
RBC: 3.28 MIL/uL — ABNORMAL LOW (ref 4.22–5.81)
RDW: 12.7 % (ref 11.5–15.5)
WBC: 5.6 10*3/uL (ref 4.0–10.5)
nRBC: 0 % (ref 0.0–0.2)

## 2021-03-01 LAB — PROTEIN / CREATININE RATIO, URINE
Creatinine, Urine: 76.07 mg/dL
Protein Creatinine Ratio: 3.79 mg/mg{Cre} — ABNORMAL HIGH (ref 0.00–0.15)
Total Protein, Urine: 288 mg/dL

## 2021-03-01 LAB — PARATHYROID HORMONE, INTACT (NO CA): PTH: 259 pg/mL — ABNORMAL HIGH (ref 15–65)

## 2021-03-01 LAB — GLUCOSE, CAPILLARY
Glucose-Capillary: 132 mg/dL — ABNORMAL HIGH (ref 70–99)
Glucose-Capillary: 129 mg/dL — ABNORMAL HIGH (ref 70–99)
Glucose-Capillary: 161 mg/dL — ABNORMAL HIGH (ref 70–99)
Glucose-Capillary: 134 mg/dL — ABNORMAL HIGH (ref 70–99)

## 2021-03-01 LAB — CREATININE, URINE, RANDOM: Creatinine, Urine: 76.45 mg/dL

## 2021-03-01 LAB — SODIUM, URINE, RANDOM: Sodium, Ur: 88 mmol/L

## 2021-03-01 LAB — ANA W/REFLEX IF POSITIVE: Anti Nuclear Antibody (ANA): POSITIVE — AB

## 2021-03-01 LAB — C3 COMPLEMENT: C3 Complement: 100 mg/dL (ref 82–167)

## 2021-03-01 LAB — HEMOGLOBIN A1C
Hgb A1c MFr Bld: 8.2 % — ABNORMAL HIGH (ref 4.8–5.6)
Mean Plasma Glucose: 189 mg/dL

## 2021-03-01 LAB — C4 COMPLEMENT: Complement C4, Body Fluid: 60 mg/dL — ABNORMAL HIGH (ref 12–38)

## 2021-03-01 LAB — CK: Total CK: 774 U/L — ABNORMAL HIGH (ref 49–397)

## 2021-03-01 MED ORDER — DARBEPOETIN ALFA 60 MCG/0.3ML IJ SOSY
60.0000 ug | PREFILLED_SYRINGE | INTRAMUSCULAR | Status: DC
  Filled 2021-03-01: qty 0.3

## 2021-03-01 MED ORDER — DARBEPOETIN ALFA 60 MCG/0.3ML IJ SOSY
PREFILLED_SYRINGE | INTRAMUSCULAR | Status: AC
  Administered 2021-03-01: 60 ug via INTRAVENOUS
  Filled 2021-03-01: qty 0.3

## 2021-03-01 MED ORDER — SODIUM CHLORIDE 0.9 % IV SOLN
250.0000 mg | Freq: Every day | INTRAVENOUS | Status: AC
  Administered 2021-03-01 – 2021-03-02 (×2): 250 mg via INTRAVENOUS
  Filled 2021-03-01 (×2): qty 20

## 2021-03-01 MED ORDER — NEPRO/CARBSTEADY PO LIQD
237.0000 mL | Freq: Two times a day (BID) | ORAL | Status: DC
  Administered 2021-03-01 – 2021-03-11 (×16): 237 mL via ORAL
  Filled 2021-03-01 (×4): qty 237

## 2021-03-01 MED ORDER — HEPARIN SODIUM (PORCINE) 1000 UNIT/ML IJ SOLN
INTRAMUSCULAR | Status: AC
  Administered 2021-03-01: 1000 [IU]
  Filled 2021-03-01: qty 4

## 2021-03-01 MED ORDER — KIDNEY FAILURE BOOK
Freq: Once | Status: DC
  Filled 2021-03-01: qty 1

## 2021-03-01 MED ORDER — RENA-VITE PO TABS
1.0000 | ORAL_TABLET | Freq: Every day | ORAL | Status: DC
  Administered 2021-03-01 – 2021-03-11 (×11): 1 via ORAL
  Filled 2021-03-01 (×11): qty 1

## 2021-03-01 MED ORDER — CHLORHEXIDINE GLUCONATE CLOTH 2 % EX PADS
6.0000 | MEDICATED_PAD | Freq: Every day | CUTANEOUS | Status: DC
  Administered 2021-03-01 – 2021-03-02 (×2): 6 via TOPICAL

## 2021-03-01 NOTE — Plan of Care (Signed)

## 2021-03-01 NOTE — Progress Notes (Signed)
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  GOAL:   Patient will meet greater than or equal to 90% of their needs  MONITOR:   PO intake, Supplement acceptance, Labs, Weight trends, I & O's  REASON FOR ASSESSMENT:   Consult Diet education  ASSESSMENT:   Pt with a PMH significant for DM1, CKD, HTN, and tobacco abuse admitted with acute on chronic renal failure.  Pt initiated on HD yesterday, 6/27. Tolerated well per MD, UF 557m. Pt remains oliguric and continues to have uremic symptoms. Pt not in room at time of RD visit. Pt having 2nd HD session. RN reports pt c/o feeling weak/fatigued today. Per H&P, pt was experiencing poor appetite with N/V for about 1 week PTA. Will provide pt with oral nutrition supplements in hope of increasing kcal/protein intake. Placed packet of diet education regarding dialysis and diabetes on pt's tray table for pt to review when arriving back to the room. Will follow-up on education as able. Note pt will have a dietitian at the outpatient dialysis center to provide continued support if questions remain after discharge.   PO intake: 30% x 1 recorded meal  Medications: aranesp, SSI, ferrlecit Labs: Cr 21.02 (H, down from yesterday), PO4 7.1 (H) CBGs [id number] NUTRITION - FOCUSED PHYSICAL EXAM: Unable to perform at this time as pt not in room at time of RD visit x2 attempts.   Diet Order:   Diet Order             Diet renal/carb modified with fluid restriction Fluid restriction: 1200 mL Fluid; Room service appropriate? Yes; Fluid consistency: Thin  Diet effective now                   EDUCATION NEEDS:   Education needs have been  addressed  Skin:  Skin Assessment: Reviewed RN Assessment  Last BM:  6/26  Height:   Ht Readings from Last 1 Encounters:  02/27/21 '6\' 5"'$  (1.956 m)    Weight:   Wt Readings from Last 1 Encounters:  03/01/21 126.6 kg   Ideal Body Weight:  94.55 kg  BMI:  Body mass index is 33.1 kg/m.  Estimated Nutritional Needs:   Kcal:  3000-3200  Protein:  160-190g  Fluid:  1L+UOP    ALarkin Ina MS, RD, LDN (she/her/hers) RD pager number and weekend/on-call pager number located in [phone]

## 2021-03-01 NOTE — Progress Notes (Signed)
[facility] NEPHROLOGY PROGRESS NOTE  Assessment/ Plan: Pt is a 25 y.o. yo male  with history of type 1 diabetes since very young age, retinopathy, uncontrolled hypertension on metoprolol, CKD 3 and was followed by nephrologist in [facility] with creatinine level 2.10 in 09/2019, presented with severe nausea, vomiting, decreased appetite and weakness for about 6 weeks, seen as a consultation for the management of advanced renal failure. SBP >200 and creatinine level around 26.  #Advanced renal failure most likely progressive CKD in the setting of uncontrolled diabetes and hypertension versus AKI on CKD stage III.  The creatinine level was around 2 back in 2021 and was seen by nephrologist in [facility].  Now he presents with around 6 weeks of uremic symptoms with creatinine level peaked at 26.  He is oliguric.  The CT scan ruled out hydronephrosis/obstruction. Urinalysis with proteinuria, and microscopic hematuria.  I will order a spot urine PCR to quantify proteinuria.  Hep B, hep C, HIV negative.  Pending other serology including ANA, complements, ANCA.  Given abnormal urinary findings, I will add protein electrophoresis and anti-GBM.  He may need kidney biopsy depending on the lab results . Started dialysis on 6/27 after placement of right IJ TDC by IR.  Tolerated HD well.  Remains oliguric and still some uremic symptoms.  Plan for second dialysis today.  I will order vein mapping for possible need of permanent access.  #Hypertensive urgency with renal failure: I have increased amlodipine to 10 mg and added home dose of metoprolol yesterday.  We will attempt some ultrafiltration with dialysis.  Discontinued IV fluid.  Monitor BP and titrate antihypertensive medications.   #Metabolic acidosis with CKD: Now managed with dialysis.  DC IV sodium bicarbonate.   #Anemia of chronic kidney disease: Iron saturation 39% with serum iron 92. Starting IV iron and ESA.   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  Monitor calcium and phosphorus level.  Subjective: Seen and examined at bedside.  He had first HD yesterday after placement of catheter.  He tolerated well with UF around 500 cc.  No urine output recorded.  He still feels weak fatigue. Objective Vital signs in last 24 hours: Vitals:   03/01/21 0335 03/01/21 0400 03/01/21 0741 03/01/21 0811  BP:  (!) 198/77 (!) 178/112 (!) 187/116  Pulse:   94   Resp:   20   Temp: 98.4 F (36.9 C)     TempSrc: Oral     SpO2:      Weight:      Height:       Weight change: -1.347 kg  Intake/Output Summary (Last 24 hours) at 03/01/2021 0814 Last data filed at 02/28/2021 2046 Gross per 24 hour  Intake --  Output 500 ml  Net -500 ml       Labs: Basic Metabolic Panel: Recent Labs  Lab 02/27/21 1825 02/28/21 0005 02/28/21 0630  NA 140 140 141  K 3.8 4.9 4.1  CL 102 106 105  CO2 18* 15* 18*  GLUCOSE 107* 102* 118*  BUN 121* 126* 124*  CREATININE 25.39* 26.08* 25.91*  CALCIUM 7.0* 7.1* 7.0*   Liver Function Tests: Recent Labs  Lab 02/27/21 1825 02/28/21 0630  AST 16 14*  ALT 15 15  ALKPHOS 103 93  BILITOT 0.5 0.7  PROT 7.0 6.0*  ALBUMIN 3.1* 2.7*   Recent Labs  Lab 02/27/21 1825  LIPASE 64*   No results for input(s): AMMONIA in the last 168 hours.  CBC: Recent Labs  Lab 02/27/21 1825 02/28/21 0630 03/01/21 0332  WBC 6.8 6.3 5.6  NEUTROABS 4.5 4.2  --   HGB 9.6* 8.4* 9.0*  HCT 28.8* 25.7* 26.9*  MCV 80.9 82.4 82.0  PLT 333 291 279   Cardiac Enzymes: Recent Labs  Lab 02/28/21 0005 02/28/21 0630 03/01/21 0332  CKTOTAL 1,116* 969* 774*   CBG: Recent Labs  Lab 02/28/21 0742 02/28/21 1140 02/28/21 1727 02/28/21 2137 03/01/21 0756  GLUCAP 94 115* 99 128* 132*    Iron Studies:  Recent Labs    02/28/21 0630  IRON 92  TIBC 234*  FERRITIN 295   Studies/Results: CT ABDOMEN PELVIS WO CONTRAST  Result Date: 02/27/2021 CLINICAL DATA:  Nausea, weakness and loss of appetite x1 week. EXAM: CT  ABDOMEN AND PELVIS WITHOUT CONTRAST TECHNIQUE: Multidetector CT imaging of the abdomen and pelvis was performed following the standard protocol without IV contrast. COMPARISON:  None. FINDINGS: Lower chest: Mild atelectasis is seen within the bilateral lung bases. Hepatobiliary: No focal liver abnormality is seen. No gallstones, gallbladder wall thickening, or biliary dilatation. Pancreas: Unremarkable. No pancreatic ductal dilatation or surrounding inflammatory changes. Spleen: Normal in size without focal abnormality. Adrenals/Urinary Tract: Adrenal glands are unremarkable. Kidneys are normal, without renal calculi, focal lesion, or hydronephrosis. Bladder is unremarkable. Stomach/Bowel: There is a small hiatal hernia. Appendix appears normal. No evidence of bowel wall thickening, distention, or inflammatory changes. Vascular/Lymphatic: No significant vascular findings are present. No enlarged abdominal or pelvic lymph nodes. Reproductive: Prostate is unremarkable. Other: No abdominal wall hernia or abnormality. No abdominopelvic ascites. Musculoskeletal: No acute or significant osseous findings. IMPRESSION: 1. Small hiatal hernia. 2. No acute or active process within the abdomen or pelvis. Electronically Signed   By: Virgina Norfolk M.D.   On: 02/27/2021 21:00   IR US Guide Vasc Access Right  Result Date: 02/28/2021 INDICATION: Renal failure requiring dialysis EXAM: TUNNELED CENTRAL VENOUS HEMODIALYSIS CATHETER PLACEMENT WITH ULTRASOUND AND FLUOROSCOPIC GUIDANCE MEDICATIONS: Ancef 2 gm IV . The antibiotic was given in an appropriate time interval prior to skin puncture. ANESTHESIA/SEDATION: Moderate (conscious) sedation was employed during this procedure. A total of Versed 3 mg and Fentanyl 100 mcg was administered intravenously. Moderate Sedation Time: 13 minutes. The patient's level of consciousness and vital signs were monitored continuously by radiology nursing throughout the procedure under my direct  supervision. FLUOROSCOPY TIME:  Fluoroscopy Time: 0 minutes 18 seconds (1 mGy). COMPLICATIONS: None immediate. PROCEDURE: Informed written consent was obtained from the patient after a discussion of the risks, benefits, and alternatives to treatment. Questions regarding the procedure were encouraged and answered. The right neck and chest were prepped with chlorhexidine in a sterile fashion, and a sterile drape was applied covering the operative field. Maximum barrier sterile technique with sterile gowns and gloves were used for the procedure. A timeout was performed prior to the initiation of the procedure. The right internal jugular vein was evaluated with ultrasound and shown to be patent. A permanent ultrasound image was obtained and placed in the patient's medical record. Using sterile gel and a sterile probe cover, the right internal jugular vein was entered with a 21 ga needle during real time ultrasound guidance. 0.018 inch guidewire placed and 21 ga needle exchanged for transitional dilator set. Utilizing fluoroscopy, 0.035 inch guidewire advanced through the needle without difficulty. Seriel dilation was performed and peel-away sheath was placed. Attention then turned to the right anterior upper chest. Following local lidocaine administration, the hemodialysis catheter was tunneled from the  chest wall to the venotomy site. The catheter was inserted through the peel-away sheath. The tip of the catheter was positioned within the right atrium using fluoroscopic guidance. All lumens of the catheter aspirated and flushed well. The dialysis lumens were locked with Heparin. The catheter was secured to the skin with suture. The insertion site was covered with a Biopatch and sterile dressing. IMPRESSION: Successful placement of 23 cm tip to cuff tunneled hemodialysis catheter via the right internal jugular vein with tips terminating within the superior aspect of the right atrium. The catheter is ready for immediate  use. Electronically Signed   By: Miachel Roux M.D.   On: 02/28/2021 16:15   IR TUNNELED CENTRAL VENOUS CATHETER PLACEMENT  Result Date: 02/28/2021 INDICATION: Renal failure requiring dialysis EXAM: TUNNELED CENTRAL VENOUS HEMODIALYSIS CATHETER PLACEMENT WITH ULTRASOUND AND FLUOROSCOPIC GUIDANCE MEDICATIONS: Ancef 2 gm IV . The antibiotic was given in an appropriate time interval prior to skin puncture. ANESTHESIA/SEDATION: Moderate (conscious) sedation was employed during this procedure. A total of Versed 3 mg and Fentanyl 100 mcg was administered intravenously. Moderate Sedation Time: 13 minutes. The patient's level of consciousness and vital signs were monitored continuously by radiology nursing throughout the procedure under my direct supervision. FLUOROSCOPY TIME:  Fluoroscopy Time: 0 minutes 18 seconds (1 mGy). COMPLICATIONS: None immediate. PROCEDURE: Informed written consent was obtained from the patient after a discussion of the risks, benefits, and alternatives to treatment. Questions regarding the procedure were encouraged and answered. The right neck and chest were prepped with chlorhexidine in a sterile fashion, and a sterile drape was applied covering the operative field. Maximum barrier sterile technique with sterile gowns and gloves were used for the procedure. A timeout was performed prior to the initiation of the procedure. The right internal jugular vein was evaluated with ultrasound and shown to be patent. A permanent ultrasound image was obtained and placed in the patient's medical record. Using sterile gel and a sterile probe cover, the right internal jugular vein was entered with a 21 ga needle during real time ultrasound guidance. 0.018 inch guidewire placed and 21 ga needle exchanged for transitional dilator set. Utilizing fluoroscopy, 0.035 inch guidewire advanced through the needle without difficulty. Seriel dilation was performed and peel-away sheath was placed. Attention then turned  to the right anterior upper chest. Following local lidocaine administration, the hemodialysis catheter was tunneled from the chest wall to the venotomy site. The catheter was inserted through the peel-away sheath. The tip of the catheter was positioned within the right atrium using fluoroscopic guidance. All lumens of the catheter aspirated and flushed well. The dialysis lumens were locked with Heparin. The catheter was secured to the skin with suture. The insertion site was covered with a Biopatch and sterile dressing. IMPRESSION: Successful placement of 23 cm tip to cuff tunneled hemodialysis catheter via the right internal jugular vein with tips terminating within the superior aspect of the right atrium. The catheter is ready for immediate use. Electronically Signed   By: Miachel Roux M.D.   On: 02/28/2021 16:15    Medications: Infusions:  sodium chloride 75 mL/hr at 02/28/21 [id number]    Scheduled Medications:  amLODipine  10 mg Oral Daily   Chlorhexidine Gluconate Cloth  6 each Topical Q0600   Chlorhexidine Gluconate Cloth  6 each Topical Q0600   fentaNYL       heparin  5,000 Units Subcutaneous Q8H   insulin aspart  0-6 Units Subcutaneous TID WC   metoprolol succinate  50 mg Oral Daily  have reviewed scheduled and prn medications.  Physical Exam: General:NAD, comfortable Heart:RRR, s1s2 nl Lungs:clear b/l, no crackle Abdomen:soft, Non-tender, non-distended Extremities: Trace LE edema Dialysis Access: Right IJ TDC.  Bobby [name] 03/01/2021,8:14 AM  LOS: 1 day

## 2021-03-01 NOTE — Progress Notes (Signed)
PROGRESS NOTE        PATIENT DETAILS Name: Bobby [name] Age: 25 y.o. Sex: male Date of Birth: 05/11/1996 Admit Date: 02/27/2021 Admitting Physician Evalee Mutton Kristeen Mans, MD PCP:Pcp, No  Brief Narrative: Patient is a 25 y.o. male with PMHx DM-1, HTN, CKD stage IIIb-presented with nausea/vomiting/weakness/decreased appetite-found to have progressive CKD with worsening renal failure.  Significant events: 6/26>> admit-nausea/vomiting/weakness-progressive CKD with worsening renal failure. 6/27>>started on HD  Significant studies: 6/26>> CT abdomen/pelvis: No acute intra-abdominal process-no hydronephrosis. 6/27>> A1c: 8.2 6/27>> HBsAg: Nonreactive 6/27>> hepatitis C antibody: Nonreactive 6/28>> UA:> 300 protein  Antimicrobial therapy: None  Microbiology data: 6/26>> COVID PCR: Negative  Procedures : 6/27>> TDC placed by IR.  Consults: Nephrology, IR.  DVT Prophylaxis : heparin injection 5,000 Units Start: 03/01/21 0600   Subjective: Feels better-some nausea-1 episode of vomiting earlier.  Assessment/Plan: Progressive renal failure/AKI superimposed on CKD stage IIID: Started on HD 6/27-still oliguric-claims only passed urine twice yesterday.  Still with some nausea and vomiting which are most likely due to uremia.  Appears to have significant amount of proteinuria-given history of DM-1 high suspicion for diabetic nephropathy.  Renal function slowly improving-but he remains oliguric-nephrology planning HD again later today.  Watch for signs of renal recovery-however will not be surprised if this is ESRD.  Will await further input from nephrology-awaiting extensive serological work-up.  Hypertensive urgency: BP remains uncontrolled-continue amlodipine, metoprolol-BP should improve further with dialysis.  On as needed IV labetalol and hydralazine as well.  Follow and optimize.    Nausea/vomiting: Likely due to uremia-no significant abnormality seen  on CT abdomen.  Continue supportive care.  Normocytic anemia: Due to CKD-on Aranesp-watch closely and transfuse if significant drop in hemoglobin.  Nontraumatic rhabdomyolysis: CK downtrending-likely clinically insignificant.  Mildly prolonged QTC: Continues to have a mildly following EKG-repeat EKG tomorrow.  R  DM-1: CBG stable with SSI-follow and adjust.  Recent Labs    02/28/21 1727 02/28/21 2137 03/01/21 0756  GLUCAP 99 128* 132*      Obesity: Estimated body mass index is 33.38 kg/m as calculated from the following:   Height as of this encounter: '6\' 5"'$  (1.956 m).   Weight as of this encounter: 127.7 kg.   Diet: Diet Order             Diet renal/carb modified with fluid restriction Fluid restriction: 1200 mL Fluid; Room service appropriate? Yes; Fluid consistency: Thin  Diet effective now                    Code Status: Full code   Family Communication: Sister at bedside  Disposition Plan: Status is: Inpatient  The patient will require care spanning > 2 midnights and should be moved to inpatient because: Inpatient level of care appropriate due to severity of illness  Dispo: The patient is from: Home              Anticipated d/c is to: Home              Patient currently is not medically stable to d/c.   Difficult to place patient No   Barriers to Discharge: AKI-on HD-watching for signs of renal recovery-still with uremic symptoms-not stable for discharge.  Antimicrobial agents: Anti-infectives (From admission, onward)    Start     Dose/Rate Route Frequency Ordered Stop   02/28/21 1030  ceFAZolin (  ANCEF) IVPB 2g/100 mL premix        2 g 200 mL/hr over 30 Minutes Intravenous To Radiology 02/28/21 0943 02/28/21 1547        Time spent: 35 minutes-Greater than 50% of this time was spent in counseling, explanation of diagnosis, planning of further management, and coordination of care.  MEDICATIONS: Scheduled Meds:  amLODipine  10 mg Oral Daily    Chlorhexidine Gluconate Cloth  6 each Topical Q0600   Chlorhexidine Gluconate Cloth  6 each Topical Q0600   darbepoetin (ARANESP) injection - DIALYSIS  60 mcg Intravenous Q Tue-HD   heparin  5,000 Units Subcutaneous Q8H   insulin aspart  0-6 Units Subcutaneous TID WC   kidney failure book   Does not apply Once   metoprolol succinate  50 mg Oral Daily   Continuous Infusions:  sodium chloride 75 mL/hr at 02/28/21 0307   ferric gluconate (FERRLECIT) IVPB     PRN Meds:.acetaminophen **OR** acetaminophen, fentaNYL (SUBLIMAZE) injection, hydrALAZINE, labetalol, oxyCODONE, polyvinyl alcohol   PHYSICAL EXAM: Vital signs: Vitals:   03/01/21 0400 03/01/21 0741 03/01/21 0800 03/01/21 0811  BP: (!) 198/77 (!) 178/112 (!) 187/116 (!) 187/116  Pulse:  94 91   Resp:  20 17   Temp:   98.4 F (36.9 C)   TempSrc:   Oral   SpO2:   98%   Weight:      Height:       Filed Weights   02/27/21 2238 02/28/21 1843 02/28/21 2059  Weight: 127.8 kg 128.2 kg 127.7 kg   Body mass index is 33.38 kg/m.   Gen Exam:Alert awake-not in any distress HEENT:atraumatic, normocephalic Chest: B/L clear to auscultation anteriorly CVS:S1S2 regular Abdomen:soft non tender, non distended Extremities:no edema Neurology: Non focal Skin: no rash   I have personally reviewed following labs and imaging studies  LABORATORY DATA: CBC: Recent Labs  Lab 02/27/21 1825 02/28/21 0630 03/01/21 0332  WBC 6.8 6.3 5.6  NEUTROABS 4.5 4.2  --   HGB 9.6* 8.4* 9.0*  HCT 28.8* 25.7* 26.9*  MCV 80.9 82.4 82.0  PLT 333 291 279     Basic Metabolic Panel: Recent Labs  Lab 02/27/21 1825 02/28/21 0005 02/28/21 0630 03/01/21 0332  NA 140 140 141 138  K 3.8 4.9 4.1 3.9  CL 102 106 105 100  CO2 18* 15* 18* 22  GLUCOSE 107* 102* 118* 133*  BUN 121* 126* 124* 92*  CREATININE 25.39* 26.08* 25.91* 21.02*  CALCIUM 7.0* 7.1* 7.0* 7.2*  PHOS  --   --   --  7.1*     GFR: Estimated Creatinine Clearance: 7.9 mL/min  (A) (by C-G formula based on SCr of 21.02 mg/dL (H)).  Liver Function Tests: Recent Labs  Lab 02/27/21 1825 02/28/21 0630 03/01/21 0332  AST 16 14*  --   ALT 15 15  --   ALKPHOS 103 93  --   BILITOT 0.5 0.7  --   PROT 7.0 6.0*  --   ALBUMIN 3.1* 2.7* 2.5*    Recent Labs  Lab 02/27/21 1825  LIPASE 64*    No results for input(s): AMMONIA in the last 168 hours.  Coagulation Profile: No results for input(s): INR, PROTIME in the last 168 hours.  Cardiac Enzymes: Recent Labs  Lab 02/28/21 0005 02/28/21 0630 03/01/21 0332  CKTOTAL 1,116* 969* 774*     BNP (last 3 results) No results for input(s): PROBNP in the last 8760 hours.  Lipid Profile: Recent Labs  02/28/21 0630  CHOL 191  HDL 28*  LDLCALC 124*  TRIG 197*  CHOLHDL 6.8     Thyroid Function Tests: Recent Labs    02/28/21 0630  TSH 2.262     Anemia Panel: Recent Labs    02/28/21 0630  VITAMINB12 966*  FOLATE 15.0  FERRITIN 295  TIBC 234*  IRON 92  RETICCTPCT 1.9     Urine analysis:    Component Value Date/Time   COLORURINE YELLOW 03/01/2021 [place] 03/01/2021 0847   LABSPEC 1.010 03/01/2021 0847   PHURINE 7.0 03/01/2021 0847   GLUCOSEU 150 (A) 03/01/2021 0847   HGBUR SMALL (A) 03/01/2021 0847   BILIRUBINUR NEGATIVE 03/01/2021 0847   KETONESUR 5 (A) 03/01/2021 0847   PROTEINUR >=300 (A) 03/01/2021 0847   NITRITE NEGATIVE 03/01/2021 0847   LEUKOCYTESUR NEGATIVE 03/01/2021 0847    Sepsis Labs: Lactic Acid, Venous No results found for: LATICACIDVEN  MICROBIOLOGY: Recent Results (from the past 240 hour(s))  SARS CORONAVIRUS 2 (TAT 6-24 HRS) Nasopharyngeal Nasopharyngeal Swab     Status: None   Collection Time: 02/27/21  7:42 PM   Specimen: Nasopharyngeal Swab  Result Value Ref Range Status   SARS Coronavirus 2 NEGATIVE NEGATIVE Final    Comment: (NOTE) SARS-CoV-2 target nucleic acids are NOT DETECTED.  The SARS-CoV-2 RNA is generally detectable in upper  and lower respiratory specimens during the acute phase of infection. Negative results do not preclude SARS-CoV-2 infection, do not rule out co-infections with other pathogens, and should not be used as the sole basis for treatment or other patient management decisions. Negative results must be combined with clinical observations, patient history, and epidemiological information. The expected result is Negative.  Fact Sheet for Patients: SugarRoll.be  Fact Sheet for Healthcare Providers: https://www.woods-mathews.com/  This test is not yet approved or cleared by the [place] FDA and  has been authorized for detection and/or diagnosis of SARS-CoV-2 by FDA under an Emergency Use Authorization (EUA). This EUA will remain  in effect (meaning this test can be used) for the duration of the COVID-19 declaration under Se ction 564(b)(1) of the Act, 21 U.S.C. section 360bbb-3(b)(1), unless the authorization is terminated or revoked sooner.  Performed at [facility], [place] [address], [place], [place] [address]     RADIOLOGY STUDIES/RESULTS: CT ABDOMEN PELVIS WO CONTRAST  Result Date: 02/27/2021 CLINICAL DATA:  Nausea, weakness and loss of appetite x1 week. EXAM: CT ABDOMEN AND PELVIS WITHOUT CONTRAST TECHNIQUE: Multidetector CT imaging of the abdomen and pelvis was performed following the standard protocol without IV contrast. COMPARISON:  None. FINDINGS: Lower chest: Mild atelectasis is seen within the bilateral lung bases. Hepatobiliary: No focal liver abnormality is seen. No gallstones, gallbladder wall thickening, or biliary dilatation. Pancreas: Unremarkable. No pancreatic ductal dilatation or surrounding inflammatory changes. Spleen: Normal in size without focal abnormality. Adrenals/Urinary Tract: Adrenal glands are unremarkable. Kidneys are normal, without renal calculi, focal lesion, or hydronephrosis. Bladder is unremarkable.  Stomach/Bowel: There is a small hiatal hernia. Appendix appears normal. No evidence of bowel wall thickening, distention, or inflammatory changes. Vascular/Lymphatic: No significant vascular findings are present. No enlarged abdominal or pelvic lymph nodes. Reproductive: Prostate is unremarkable. Other: No abdominal wall hernia or abnormality. No abdominopelvic ascites. Musculoskeletal: No acute or significant osseous findings. IMPRESSION: 1. Small hiatal hernia. 2. No acute or active process within the abdomen or pelvis. Electronically Signed   By: Virgina Norfolk M.D.   On: 02/27/2021 21:00   IR US Guide  Vasc Access Right  Result Date: 02/28/2021 INDICATION: Renal failure requiring dialysis EXAM: TUNNELED CENTRAL VENOUS HEMODIALYSIS CATHETER PLACEMENT WITH ULTRASOUND AND FLUOROSCOPIC GUIDANCE MEDICATIONS: Ancef 2 gm IV . The antibiotic was given in an appropriate time interval prior to skin puncture. ANESTHESIA/SEDATION: Moderate (conscious) sedation was employed during this procedure. A total of Versed 3 mg and Fentanyl 100 mcg was administered intravenously. Moderate Sedation Time: 13 minutes. The patient's level of consciousness and vital signs were monitored continuously by radiology nursing throughout the procedure under my direct supervision. FLUOROSCOPY TIME:  Fluoroscopy Time: 0 minutes 18 seconds (1 mGy). COMPLICATIONS: None immediate. PROCEDURE: Informed written consent was obtained from the patient after a discussion of the risks, benefits, and alternatives to treatment. Questions regarding the procedure were encouraged and answered. The right neck and chest were prepped with chlorhexidine in a sterile fashion, and a sterile drape was applied covering the operative field. Maximum barrier sterile technique with sterile gowns and gloves were used for the procedure. A timeout was performed prior to the initiation of the procedure. The right internal jugular vein was evaluated with ultrasound and  shown to be patent. A permanent ultrasound image was obtained and placed in the patient's medical record. Using sterile gel and a sterile probe cover, the right internal jugular vein was entered with a 21 ga needle during real time ultrasound guidance. 0.018 inch guidewire placed and 21 ga needle exchanged for transitional dilator set. Utilizing fluoroscopy, 0.035 inch guidewire advanced through the needle without difficulty. Seriel dilation was performed and peel-away sheath was placed. Attention then turned to the right anterior upper chest. Following local lidocaine administration, the hemodialysis catheter was tunneled from the chest wall to the venotomy site. The catheter was inserted through the peel-away sheath. The tip of the catheter was positioned within the right atrium using fluoroscopic guidance. All lumens of the catheter aspirated and flushed well. The dialysis lumens were locked with Heparin. The catheter was secured to the skin with suture. The insertion site was covered with a Biopatch and sterile dressing. IMPRESSION: Successful placement of 23 cm tip to cuff tunneled hemodialysis catheter via the right internal jugular vein with tips terminating within the superior aspect of the right atrium. The catheter is ready for immediate use. Electronically Signed   By: Miachel Roux M.D.   On: 02/28/2021 16:15   IR TUNNELED CENTRAL VENOUS CATHETER PLACEMENT  Result Date: 02/28/2021 INDICATION: Renal failure requiring dialysis EXAM: TUNNELED CENTRAL VENOUS HEMODIALYSIS CATHETER PLACEMENT WITH ULTRASOUND AND FLUOROSCOPIC GUIDANCE MEDICATIONS: Ancef 2 gm IV . The antibiotic was given in an appropriate time interval prior to skin puncture. ANESTHESIA/SEDATION: Moderate (conscious) sedation was employed during this procedure. A total of Versed 3 mg and Fentanyl 100 mcg was administered intravenously. Moderate Sedation Time: 13 minutes. The patient's level of consciousness and vital signs were monitored  continuously by radiology nursing throughout the procedure under my direct supervision. FLUOROSCOPY TIME:  Fluoroscopy Time: 0 minutes 18 seconds (1 mGy). COMPLICATIONS: None immediate. PROCEDURE: Informed written consent was obtained from the patient after a discussion of the risks, benefits, and alternatives to treatment. Questions regarding the procedure were encouraged and answered. The right neck and chest were prepped with chlorhexidine in a sterile fashion, and a sterile drape was applied covering the operative field. Maximum barrier sterile technique with sterile gowns and gloves were used for the procedure. A timeout was performed prior to the initiation of the procedure. The right internal jugular vein was evaluated with ultrasound and shown to  be patent. A permanent ultrasound image was obtained and placed in the patient's medical record. Using sterile gel and a sterile probe cover, the right internal jugular vein was entered with a 21 ga needle during real time ultrasound guidance. 0.018 inch guidewire placed and 21 ga needle exchanged for transitional dilator set. Utilizing fluoroscopy, 0.035 inch guidewire advanced through the needle without difficulty. Seriel dilation was performed and peel-away sheath was placed. Attention then turned to the right anterior upper chest. Following local lidocaine administration, the hemodialysis catheter was tunneled from the chest wall to the venotomy site. The catheter was inserted through the peel-away sheath. The tip of the catheter was positioned within the right atrium using fluoroscopic guidance. All lumens of the catheter aspirated and flushed well. The dialysis lumens were locked with Heparin. The catheter was secured to the skin with suture. The insertion site was covered with a Biopatch and sterile dressing. IMPRESSION: Successful placement of 23 cm tip to cuff tunneled hemodialysis catheter via the right internal jugular vein with tips terminating within  the superior aspect of the right atrium. The catheter is ready for immediate use. Electronically Signed   By: Miachel Roux M.D.   On: 02/28/2021 16:15     LOS: 1 day   Oren Binet, MD  [facility]    To contact the attending provider between 7A-7P or the covering provider during after hours 7P-7A, please log into the web site [email or web address] and access using universal [place] password for that web site. If you do not have the password, please call the hospital operator.  03/01/2021, 11:30 AM

## 2021-03-01 NOTE — Progress Notes (Signed)
BUE vein mapping has been completed.  Results can be found under chart review under CV PROC. 03/01/2021 4:41 PM Roland Lipke RVT, RDMS

## 2021-03-01 NOTE — Plan of Care (Signed)

## 2021-03-02 LAB — CBC
HCT: 27.1 % — ABNORMAL LOW (ref 39.0–52.0)
HCT: 24.6 % — ABNORMAL LOW (ref 39.0–52.0)
Hemoglobin: 9 g/dL — ABNORMAL LOW (ref 13.0–17.0)
Hemoglobin: 7.9 g/dL — ABNORMAL LOW (ref 13.0–17.0)
MCH: 27.4 pg (ref 26.0–34.0)
MCH: 26.8 pg (ref 26.0–34.0)
MCHC: 33.2 g/dL (ref 30.0–36.0)
MCHC: 32.1 g/dL (ref 30.0–36.0)
MCV: 82.4 fL (ref 80.0–100.0)
MCV: 83.4 fL (ref 80.0–100.0)
Platelets: 221 10*3/uL (ref 150–400)
Platelets: 205 10*3/uL (ref 150–400)
RBC: 3.29 MIL/uL — ABNORMAL LOW (ref 4.22–5.81)
RBC: 2.95 MIL/uL — ABNORMAL LOW (ref 4.22–5.81)
RDW: 12.7 % (ref 11.5–15.5)
RDW: 12.7 % (ref 11.5–15.5)
WBC: 6.2 10*3/uL (ref 4.0–10.5)
WBC: 7.2 10*3/uL (ref 4.0–10.5)
nRBC: 0 % (ref 0.0–0.2)
nRBC: 0 % (ref 0.0–0.2)

## 2021-03-02 LAB — PROTEIN ELECTROPHORESIS, SERUM
A/G Ratio: 0.8 (ref 0.7–1.7)
Albumin ELP: 2.7 g/dL — ABNORMAL LOW (ref 2.9–4.4)
Alpha-1-Globulin: 0.2 g/dL (ref 0.0–0.4)
Alpha-2-Globulin: 1.2 g/dL — ABNORMAL HIGH (ref 0.4–1.0)
Beta Globulin: 0.7 g/dL (ref 0.7–1.3)
Gamma Globulin: 1.1 g/dL (ref 0.4–1.8)
Globulin, Total: 3.3 g/dL (ref 2.2–3.9)
Total Protein ELP: 6 g/dL (ref 6.0–8.5)

## 2021-03-02 LAB — RENAL FUNCTION PANEL
Albumin: 2.5 g/dL — ABNORMAL LOW (ref 3.5–5.0)
Anion gap: 11 (ref 5–15)
BUN: 67 mg/dL — ABNORMAL HIGH (ref 6–20)
CO2: 24 mmol/L (ref 22–32)
Calcium: 7.6 mg/dL — ABNORMAL LOW (ref 8.9–10.3)
Chloride: 100 mmol/L (ref 98–111)
Creatinine, Ser: 16.95 mg/dL — ABNORMAL HIGH (ref 0.61–1.24)
GFR, Estimated: 4 mL/min — ABNORMAL LOW (ref >60)
Glucose, Bld: 134 mg/dL — ABNORMAL HIGH (ref 70–99)
Phosphorus: 6.3 mg/dL — ABNORMAL HIGH (ref 2.5–4.6)
Potassium: 4.2 mmol/L (ref 3.5–5.1)
Sodium: 135 mmol/L (ref 135–145)

## 2021-03-02 LAB — HEPATITIS B CORE ANTIBODY, TOTAL: Hep B Core Total Ab: NONREACTIVE

## 2021-03-02 LAB — GLUCOSE, CAPILLARY
Glucose-Capillary: 116 mg/dL — ABNORMAL HIGH (ref 70–99)
Glucose-Capillary: 158 mg/dL — ABNORMAL HIGH (ref 70–99)
Glucose-Capillary: 141 mg/dL — ABNORMAL HIGH (ref 70–99)

## 2021-03-02 LAB — MAGNESIUM: Magnesium: 1.7 mg/dL (ref 1.7–2.4)

## 2021-03-02 MED ORDER — ONDANSETRON HCL 4 MG/2ML IJ SOLN
4.0000 mg | Freq: Once | INTRAMUSCULAR | Status: AC
  Administered 2021-03-02: 4 mg via INTRAVENOUS
  Filled 2021-03-02: qty 2

## 2021-03-02 MED ORDER — HEPARIN SODIUM (PORCINE) 1000 UNIT/ML DIALYSIS
1000.0000 [IU] | INTRAMUSCULAR | Status: DC | PRN
  Filled 2021-03-02: qty 1

## 2021-03-02 MED ORDER — HEPARIN SODIUM (PORCINE) 1000 UNIT/ML IJ SOLN
INTRAMUSCULAR | Status: AC
  Filled 2021-03-02: qty 4

## 2021-03-02 MED ORDER — CLONIDINE HCL 0.1 MG PO TABS
0.1000 mg | ORAL_TABLET | Freq: Three times a day (TID) | ORAL | Status: DC
  Administered 2021-03-02 (×3): 0.1 mg via ORAL
  Filled 2021-03-02 (×3): qty 1

## 2021-03-02 MED ORDER — SODIUM CHLORIDE 0.9 % IV SOLN: 100.0000 mL | INTRAVENOUS | Status: DC | PRN

## 2021-03-02 MED ORDER — LIDOCAINE HCL (PF) 1 % IJ SOLN: 5.0000 mL | INTRAMUSCULAR | Status: DC | PRN

## 2021-03-02 MED ORDER — LIDOCAINE-PRILOCAINE 2.5-2.5 % EX CREA
1.0000 "application " | TOPICAL_CREAM | CUTANEOUS | Status: DC | PRN
  Filled 2021-03-02: qty 5

## 2021-03-02 MED ORDER — CHLORHEXIDINE GLUCONATE CLOTH 2 % EX PADS
6.0000 | MEDICATED_PAD | Freq: Every day | CUTANEOUS | Status: DC
  Administered 2021-03-02 – 2021-03-03 (×2): 6 via TOPICAL

## 2021-03-02 MED ORDER — ALTEPLASE 2 MG IJ SOLR
2.0000 mg | Freq: Once | INTRAMUSCULAR | Status: DC | PRN
  Filled 2021-03-02: qty 2

## 2021-03-02 MED ORDER — METOPROLOL SUCCINATE ER 100 MG PO TB24
100.0000 mg | ORAL_TABLET | Freq: Every day | ORAL | Status: DC
  Administered 2021-03-02: 100 mg via ORAL
  Filled 2021-03-02: qty 1

## 2021-03-02 MED ORDER — PENTAFLUOROPROP-TETRAFLUOROETH EX AERO: 1.0000 "application " | INHALATION_SPRAY | CUTANEOUS | Status: DC | PRN

## 2021-03-02 NOTE — Progress Notes (Signed)
PROGRESS NOTE        PATIENT DETAILS Name: Bobby [name] Age: 25 y.o. Sex: male Date of Birth: 02/14/1996 Admit Date: 02/27/2021 Admitting Physician Evalee Mutton Kristeen Mans, MD PCP:Pcp, No  Brief Narrative: Patient is a 25 y.o. male with PMHx DM-1, HTN, CKD stage IIIb-presented with nausea/vomiting/weakness/decreased appetite-found to have progressive CKD with worsening renal failure.  Significant events: 6/26>> admit-nausea/vomiting/weakness-progressive CKD with worsening renal failure. 6/27>>started on HD  Significant studies: 6/26>> CT abdomen/pelvis: No acute intra-abdominal process-no hydronephrosis. 6/27>> A1c: 8.2 6/27>> HIV: Nonreactive 6/27>> ANA: Positive 6/27>> dsDNA:negative 6/27>> SSA antibody, SSB antibody: Negative 6/27>> anticentromere, anti-Jo-1: Negative 6/27>> c-ANCA/p-ANCA titers: Negative 6/27>> hepatitis C antibody: Nonreactive 6/27>> HBsAg: Nonreactive 6/27>> glomerular basement membrane antibody: Pending 6/27>> SPEP: Pending 6/28>> UA:> 300 protein,+RBC  Antimicrobial therapy: None  Microbiology data: 6/26>> COVID PCR: Negative  Procedures : 6/27>> TDC placed by IR.  Consults: Nephrology, IR.  DVT Prophylaxis : heparin injection 5,000 Units Start: 03/01/21 0600   Subjective: Some nausea continues-vomited yesterday.  BP remains elevated.  Claims to have only urinated 2-3 times in the past 24 hours.  Assessment/Plan: Progressive renal failure/AKI superimposed on CKD stage IIID: Continues to be oliguric-we will get his third hemodialysis today.  Still with some nausea which is likely due to lingering uremia.  Etiology of renal failure probably diabetic/hypertensive nephropathy-suspect that this may be ESRD.  Nephrology following and directing care.  Monitor for signs of potential renal recovery.    Hypertensive urgency: BP remains uncontrolled-increased metoprolol 200 mg daily, nephrology has added clonidine-remains on  amlodipine.  Follow and optimize.    Nausea/vomiting: Likely due to uremia-no significant abnormality seen on CT abdomen.  Continue supportive care.  Normocytic anemia: Due to CKD-on Aranesp-watch closely and transfuse if significant drop in hemoglobin.  Nontraumatic rhabdomyolysis: CK downtrending-likely clinically insignificant.  Mildly prolonged QTC: Resolved   DM-1: CBG stable with SSI-follow and adjust.  Recent Labs    03/01/21 1637 03/01/21 2209 03/02/21 0814  GLUCAP 161* 134* 116*      Obesity: Estimated body mass index is 35.29 kg/m as calculated from the following:   Height as of this encounter: '6\' 5"'$  (1.956 m).   Weight as of this encounter: 135 kg.   Diet: Diet Order             Diet renal/carb modified with fluid restriction Fluid restriction: 1200 mL Fluid; Room service appropriate? Yes; Fluid consistency: Thin  Diet effective now                    Code Status: Full code   Family Communication: Sister at bedside on 6/28  Disposition Plan: Status is: Inpatient  The patient will require care spanning > 2 midnights and should be moved to inpatient because: Inpatient level of care appropriate due to severity of illness  Dispo: The patient is from: Home              Anticipated d/c is to: Home              Patient currently is not medically stable to d/c.   Difficult to place patient No   Barriers to Discharge: AKI-on HD-watching for signs of renal recovery-still with uremic symptoms-not stable for discharge.  Antimicrobial agents: Anti-infectives (From admission, onward)    Start     Dose/Rate Route Frequency Ordered Stop  02/28/21 1030  ceFAZolin (ANCEF) IVPB 2g/100 mL premix        2 g 200 mL/hr over 30 Minutes Intravenous To Radiology 02/28/21 0943 02/28/21 1547        Time spent: 35 minutes-Greater than 50% of this time was spent in counseling, explanation of diagnosis, planning of further management, and coordination of  care.  MEDICATIONS: Scheduled Meds:  amLODipine  10 mg Oral Daily   Chlorhexidine Gluconate Cloth  6 each Topical Q0600   Chlorhexidine Gluconate Cloth  6 each Topical Q0600   Chlorhexidine Gluconate Cloth  6 each Topical Q0600   cloNIDine  0.1 mg Oral TID   darbepoetin (ARANESP) injection - DIALYSIS  60 mcg Intravenous Q Tue-HD   feeding supplement (NEPRO CARB STEADY)  237 mL Oral BID BM   heparin  5,000 Units Subcutaneous Q8H   insulin aspart  0-6 Units Subcutaneous TID WC   kidney failure book   Does not apply Once   metoprolol succinate  100 mg Oral Daily   multivitamin  1 tablet Oral QHS   Continuous Infusions:  sodium chloride 75 mL/hr at 02/28/21 0307   sodium chloride     sodium chloride     PRN Meds:.sodium chloride, sodium chloride, acetaminophen **OR** acetaminophen, alteplase, fentaNYL (SUBLIMAZE) injection, heparin, hydrALAZINE, labetalol, lidocaine (PF), lidocaine-prilocaine, oxyCODONE, pentafluoroprop-tetrafluoroeth, polyvinyl alcohol   PHYSICAL EXAM: Vital signs: Vitals:   03/02/21 1130 03/02/21 1200 03/02/21 1230 03/02/21 1300  BP: (!) 175/97 (!) 166/87 (!) 157/92 (!) 171/85  Pulse:    92  Resp: 20     Temp:      TempSrc:      SpO2:    96%  Weight:      Height:       Filed Weights   03/01/21 1051 03/01/21 1321 03/02/21 1120  Weight: 126.6 kg 125.6 kg 135 kg   Body mass index is 35.29 kg/m.   Gen Exam:Alert awake-not in any distress HEENT:atraumatic, normocephalic Chest: B/L clear to auscultation anteriorly CVS:S1S2 regular Abdomen:soft non tender, non distended Extremities:no edema Neurology: Non focal Skin: no rash   I have personally reviewed following labs and imaging studies  LABORATORY DATA: CBC: Recent Labs  Lab 02/27/21 1825 02/28/21 0630 03/01/21 0332 03/02/21 0111 03/02/21 1142  WBC 6.8 6.3 5.6 6.2 7.2  NEUTROABS 4.5 4.2  --   --   --   HGB 9.6* 8.4* 9.0* 9.0* 7.9*  HCT 28.8* 25.7* 26.9* 27.1* 24.6*  MCV 80.9 82.4 82.0  82.4 83.4  PLT 333 291 279 221 205     Basic Metabolic Panel: Recent Labs  Lab 02/27/21 1825 02/28/21 0005 02/28/21 0630 03/01/21 0332 03/02/21 0111  NA 140 140 141 138 135  K 3.8 4.9 4.1 3.9 4.2  CL 102 106 105 100 100  CO2 18* 15* 18* 22 24  GLUCOSE 107* 102* 118* 133* 134*  BUN 121* 126* 124* 92* 67*  CREATININE 25.39* 26.08* 25.91* 21.02* 16.95*  CALCIUM 7.0* 7.1* 7.0* 7.2* 7.6*  MG  --   --   --   --  1.7  PHOS  --   --   --  7.1* 6.3*     GFR: Estimated Creatinine Clearance: 10.1 mL/min (A) (by C-G formula based on SCr of 16.95 mg/dL (H)).  Liver Function Tests: Recent Labs  Lab 02/27/21 1825 02/28/21 0630 03/01/21 0332 03/02/21 0111  AST 16 14*  --   --   ALT 15 15  --   --  ALKPHOS 103 93  --   --   BILITOT 0.5 0.7  --   --   PROT 7.0 6.0*  --   --   ALBUMIN 3.1* 2.7* 2.5* 2.5*    Recent Labs  Lab 02/27/21 1825  LIPASE 64*    No results for input(s): AMMONIA in the last 168 hours.  Coagulation Profile: No results for input(s): INR, PROTIME in the last 168 hours.  Cardiac Enzymes: Recent Labs  Lab 02/28/21 0005 02/28/21 0630 03/01/21 0332  CKTOTAL 1,116* 969* 774*     BNP (last 3 results) No results for input(s): PROBNP in the last 8760 hours.  Lipid Profile: Recent Labs    02/28/21 0630  CHOL 191  HDL 28*  LDLCALC 124*  TRIG 197*  CHOLHDL 6.8     Thyroid Function Tests: Recent Labs    02/28/21 0630  TSH 2.262     Anemia Panel: Recent Labs    02/28/21 0630  VITAMINB12 966*  FOLATE 15.0  FERRITIN 295  TIBC 234*  IRON 92  RETICCTPCT 1.9     Urine analysis:    Component Value Date/Time   COLORURINE YELLOW 03/01/2021 [place] 03/01/2021 0847   LABSPEC 1.010 03/01/2021 0847   PHURINE 7.0 03/01/2021 0847   GLUCOSEU 150 (A) 03/01/2021 0847   HGBUR SMALL (A) 03/01/2021 0847   BILIRUBINUR NEGATIVE 03/01/2021 0847   KETONESUR 5 (A) 03/01/2021 0847   PROTEINUR >=300 (A) 03/01/2021 0847    NITRITE NEGATIVE 03/01/2021 0847   LEUKOCYTESUR NEGATIVE 03/01/2021 0847    Sepsis Labs: Lactic Acid, Venous No results found for: LATICACIDVEN  MICROBIOLOGY: Recent Results (from the past 240 hour(s))  SARS CORONAVIRUS 2 (TAT 6-24 HRS) Nasopharyngeal Nasopharyngeal Swab     Status: None   Collection Time: 02/27/21  7:42 PM   Specimen: Nasopharyngeal Swab  Result Value Ref Range Status   SARS Coronavirus 2 NEGATIVE NEGATIVE Final    Comment: (NOTE) SARS-CoV-2 target nucleic acids are NOT DETECTED.  The SARS-CoV-2 RNA is generally detectable in upper and lower respiratory specimens during the acute phase of infection. Negative results do not preclude SARS-CoV-2 infection, do not rule out co-infections with other pathogens, and should not be used as the sole basis for treatment or other patient management decisions. Negative results must be combined with clinical observations, patient history, and epidemiological information. The expected result is Negative.  Fact Sheet for Patients: SugarRoll.be  Fact Sheet for Healthcare Providers: https://www.woods-mathews.com/  This test is not yet approved or cleared by the [place] FDA and  has been authorized for detection and/or diagnosis of SARS-CoV-2 by FDA under an Emergency Use Authorization (EUA). This EUA will remain  in effect (meaning this test can be used) for the duration of the COVID-19 declaration under Se ction 564(b)(1) of the Act, 21 U.S.C. section 360bbb-3(b)(1), unless the authorization is terminated or revoked sooner.  Performed at [facility], [place] [address], [place], [place] [address]     RADIOLOGY STUDIES/RESULTS: IR US Guide Vasc Access Right  Result Date: 02/28/2021 INDICATION: Renal failure requiring dialysis EXAM: TUNNELED CENTRAL VENOUS HEMODIALYSIS CATHETER PLACEMENT WITH ULTRASOUND AND FLUOROSCOPIC GUIDANCE MEDICATIONS: Ancef 2 gm IV . The antibiotic  was given in an appropriate time interval prior to skin puncture. ANESTHESIA/SEDATION: Moderate (conscious) sedation was employed during this procedure. A total of Versed 3 mg and Fentanyl 100 mcg was administered intravenously. Moderate Sedation Time: 13 minutes. The patient's level of consciousness and vital signs were monitored  continuously by radiology nursing throughout the procedure under my direct supervision. FLUOROSCOPY TIME:  Fluoroscopy Time: 0 minutes 18 seconds (1 mGy). COMPLICATIONS: None immediate. PROCEDURE: Informed written consent was obtained from the patient after a discussion of the risks, benefits, and alternatives to treatment. Questions regarding the procedure were encouraged and answered. The right neck and chest were prepped with chlorhexidine in a sterile fashion, and a sterile drape was applied covering the operative field. Maximum barrier sterile technique with sterile gowns and gloves were used for the procedure. A timeout was performed prior to the initiation of the procedure. The right internal jugular vein was evaluated with ultrasound and shown to be patent. A permanent ultrasound image was obtained and placed in the patient's medical record. Using sterile gel and a sterile probe cover, the right internal jugular vein was entered with a 21 ga needle during real time ultrasound guidance. 0.018 inch guidewire placed and 21 ga needle exchanged for transitional dilator set. Utilizing fluoroscopy, 0.035 inch guidewire advanced through the needle without difficulty. Seriel dilation was performed and peel-away sheath was placed. Attention then turned to the right anterior upper chest. Following local lidocaine administration, the hemodialysis catheter was tunneled from the chest wall to the venotomy site. The catheter was inserted through the peel-away sheath. The tip of the catheter was positioned within the right atrium using fluoroscopic guidance. All lumens of the catheter aspirated  and flushed well. The dialysis lumens were locked with Heparin. The catheter was secured to the skin with suture. The insertion site was covered with a Biopatch and sterile dressing. IMPRESSION: Successful placement of 23 cm tip to cuff tunneled hemodialysis catheter via the right internal jugular vein with tips terminating within the superior aspect of the right atrium. The catheter is ready for immediate use. Electronically Signed   By: Miachel Roux M.D.   On: 02/28/2021 16:15   VAS [place] UPPER EXT VEIN MAPPING (PRE-OP AVF)  Result Date: 03/01/2021 UPPER EXTREMITY VEIN MAPPING Patient Name:  ANTWIAN COLMENARES  Date of Exam:   03/01/2021 Medical Rec #: [id number]           Accession #:    [id number] Date of Birth: 03/25/1996            Patient Gender: M Patient Age:   24Y Exam Location:  [facility] Procedure:      VAS [place] UPPER EXT VEIN MAPPING (PRE-OP AVF) Referring Phys: [id number] DRON PRASAD BHANDARI --------------------------------------------------------------------------------  Indications: Pre-access. Performing Technologist: Rogelia Rohrer RVT, RDMS  Examination Guidelines: A complete evaluation includes B-mode imaging, spectral Doppler, color Doppler, and power Doppler as needed of all accessible portions of each vessel. Bilateral testing is considered an integral part of a complete examination. Limited examinations for reoccurring indications may be performed as noted. +-----------------+-------------+----------+--------+ Right Cephalic   Diameter (cm)Depth (cm)Findings +-----------------+-------------+----------+--------+ Shoulder             0.07        0.28            +-----------------+-------------+----------+--------+ Prox upper arm       0.11        0.20            +-----------------+-------------+----------+--------+ Mid upper arm        0.14        0.20            +-----------------+-------------+----------+--------+ Dist upper arm       0.10        0.27             +-----------------+-------------+----------+--------+  Antecubital fossa    0.15        0.55            +-----------------+-------------+----------+--------+ Prox forearm         0.24        0.55            +-----------------+-------------+----------+--------+ Mid forearm          0.12        0.46            +-----------------+-------------+----------+--------+ Dist forearm         0.16        0.29            +-----------------+-------------+----------+--------+ Wrist                0.13        0.25            +-----------------+-------------+----------+--------+ +-----------------+-------------+----------+---------+ Right Basilic    Diameter (cm)Depth (cm)Findings  +-----------------+-------------+----------+---------+ Prox upper arm       0.36                         +-----------------+-------------+----------+---------+ Mid upper arm        0.36                         +-----------------+-------------+----------+---------+ Dist upper arm       0.26               branching +-----------------+-------------+----------+---------+ Antecubital fossa    0.18               branching +-----------------+-------------+----------+---------+ Prox forearm         0.15                         +-----------------+-------------+----------+---------+ Mid forearm          0.14                         +-----------------+-------------+----------+---------+ Distal forearm       0.15                         +-----------------+-------------+----------+---------+ Wrist                0.13                         +-----------------+-------------+----------+---------+ +-----------------+-------------+----------+---------+ Left Cephalic    Diameter (cm)Depth (cm)Findings  +-----------------+-------------+----------+---------+ Shoulder             0.16        1.16             +-----------------+-------------+----------+---------+ Prox upper arm       0.11         0.55             +-----------------+-------------+----------+---------+ Mid upper arm        0.12        0.59             +-----------------+-------------+----------+---------+ Dist upper arm       0.19        0.74             +-----------------+-------------+----------+---------+ Antecubital fossa    0.21        0.23             +-----------------+-------------+----------+---------+  Prox forearm         0.13        0.40   branching +-----------------+-------------+----------+---------+ Mid forearm          0.11        0.29             +-----------------+-------------+----------+---------+ Dist forearm         0.22        0.24   branching +-----------------+-------------+----------+---------+ Wrist                0.15        0.29   branching +-----------------+-------------+----------+---------+ +-----------------+-------------+----------+---------+ Left Basilic     Diameter (cm)Depth (cm)Findings  +-----------------+-------------+----------+---------+ Prox upper arm       0.50                         +-----------------+-------------+----------+---------+ Mid upper arm        0.43                         +-----------------+-------------+----------+---------+ Dist upper arm       0.36                         +-----------------+-------------+----------+---------+ Antecubital fossa    0.26               branching +-----------------+-------------+----------+---------+ Prox forearm         0.23               branching +-----------------+-------------+----------+---------+ Mid forearm          0.23                         +-----------------+-------------+----------+---------+ Distal forearm       0.27                         +-----------------+-------------+----------+---------+ Wrist                0.24                         +-----------------+-------------+----------+---------+ *See table(s) above for measurements and observations.   Diagnosing physician: Deitra Mayo MD Electronically signed by Deitra Mayo MD on 03/01/2021 at 5:57:43 PM.    Final    IR TUNNELED CENTRAL VENOUS CATHETER PLACEMENT  Result Date: 02/28/2021 INDICATION: Renal failure requiring dialysis EXAM: TUNNELED CENTRAL VENOUS HEMODIALYSIS CATHETER PLACEMENT WITH ULTRASOUND AND FLUOROSCOPIC GUIDANCE MEDICATIONS: Ancef 2 gm IV . The antibiotic was given in an appropriate time interval prior to skin puncture. ANESTHESIA/SEDATION: Moderate (conscious) sedation was employed during this procedure. A total of Versed 3 mg and Fentanyl 100 mcg was administered intravenously. Moderate Sedation Time: 13 minutes. The patient's level of consciousness and vital signs were monitored continuously by radiology nursing throughout the procedure under my direct supervision. FLUOROSCOPY TIME:  Fluoroscopy Time: 0 minutes 18 seconds (1 mGy). COMPLICATIONS: None immediate. PROCEDURE: Informed written consent was obtained from the patient after a discussion of the risks, benefits, and alternatives to treatment. Questions regarding the procedure were encouraged and answered. The right neck and chest were prepped with chlorhexidine in a sterile fashion, and a sterile drape was applied covering the operative field. Maximum barrier sterile technique with sterile gowns and gloves were used for the procedure. A timeout was performed prior to the  initiation of the procedure. The right internal jugular vein was evaluated with ultrasound and shown to be patent. A permanent ultrasound image was obtained and placed in the patient's medical record. Using sterile gel and a sterile probe cover, the right internal jugular vein was entered with a 21 ga needle during real time ultrasound guidance. 0.018 inch guidewire placed and 21 ga needle exchanged for transitional dilator set. Utilizing fluoroscopy, 0.035 inch guidewire advanced through the needle without difficulty. Seriel dilation was performed  and peel-away sheath was placed. Attention then turned to the right anterior upper chest. Following local lidocaine administration, the hemodialysis catheter was tunneled from the chest wall to the venotomy site. The catheter was inserted through the peel-away sheath. The tip of the catheter was positioned within the right atrium using fluoroscopic guidance. All lumens of the catheter aspirated and flushed well. The dialysis lumens were locked with Heparin. The catheter was secured to the skin with suture. The insertion site was covered with a Biopatch and sterile dressing. IMPRESSION: Successful placement of 23 cm tip to cuff tunneled hemodialysis catheter via the right internal jugular vein with tips terminating within the superior aspect of the right atrium. The catheter is ready for immediate use. Electronically Signed   By: Miachel Roux M.D.   On: 02/28/2021 16:15     LOS: 2 days   Oren Binet, MD  [facility]    To contact the attending provider between 7A-7P or the covering provider during after hours 7P-7A, please log into the web site [email or web address] and access using universal [place] password for that web site. If you do not have the password, please call the hospital operator.  03/02/2021, 1:31 PM

## 2021-03-02 NOTE — Progress Notes (Addendum)
[facility] PROGRESS NOTE  Assessment/ Plan: Pt is a 25 y.o. yo male  with history of type 1 diabetes since very young age, retinopathy, uncontrolled hypertension on metoprolol, CKD 3 and was followed by nephrologist in [facility] with creatinine level 2.10 in 09/2019, presented with severe nausea, vomiting, decreased appetite and weakness for about 6 weeks, seen as a consultation for the management of advanced renal failure. SBP >200 and creatinine level around 26.  #Advanced renal failure most likely progressive CKD in the setting of uncontrolled diabetes and hypertension versus AKI on CKD stage III.  The creatinine level was around 2 back in 2021 and was seen by nephrologist in [facility].  Now he presents with around 6 weeks of uremic symptoms with creatinine level peaked at 26.  The CT scan ruled out hydronephrosis/obstruction. Urinalysis with proteinuria UPC 3.7 and microscopic hematuria.  Hep B, hep C, HIV, ANCA negative.  ANA positive with unremarkable complements and antidsDNA negative.  Follow-up protein electrophoresis and anti-GBM. The proteinuria probably because of uncontrolled diabetes.  I am not sure how much benefit he will get with kidney biopsy at this time, he probably has a lot of chronic finding.  Patient is hesitant to do biopsy at this time as well.  Vein mapping was ordered.  Social worker already following to arrange outpatient dialysis. Third dialysis today.  #Hypertensive urgency with renal failure: Blood pressure is still elevated.  Continue amlodipine and metoprolol.  I will add clonidine.  Ultrafiltration during dialysis.  Monitor blood pressure.    #Metabolic acidosis with CKD: Now managed with dialysis.  DC IV sodium bicarbonate.   #Anemia of chronic kidney disease: Iron saturation 39% with serum iron 92. Starting IV iron and ESA.   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  Monitor calcium and phosphorus level.  Subjective: Seen and examined at  bedside.  Tolerated second dialysis well with 1 L ultrafiltration.  He tolerated well.  Denies nausea vomiting chest pain shortness of breath.  His nephew at bedside.  Objective Vital signs in last 24 hours: Vitals:   03/01/21 2011 03/02/21 0032 03/02/21 0512 03/02/21 0814  BP: (!) 176/105 (!) 191/127 (!) 206/126 (!) 214/91  Pulse: 97 99 100   Resp: '18 18 18   '$ Temp: 98.5 F (36.9 C) 98.3 F (36.8 C) 98.8 F (37.1 C) 98.5 F (36.9 C)  TempSrc: Oral Oral Oral Oral  SpO2: 96% 99% 99%   Weight:      Height:       Weight change: -1.6 kg  Intake/Output Summary (Last 24 hours) at 03/02/2021 0827 Last data filed at 03/01/2021 1500 Gross per 24 hour  Intake --  Output 2000 ml  Net -2000 ml        Labs: Basic Metabolic Panel: Recent Labs  Lab 02/28/21 0630 03/01/21 0332 03/02/21 0111  NA 141 138 135  K 4.1 3.9 4.2  CL 105 100 100  CO2 18* 22 24  GLUCOSE 118* 133* 134*  BUN 124* 92* 67*  CREATININE 25.91* 21.02* 16.95*  CALCIUM 7.0* 7.2* 7.6*  PHOS  --  7.1* 6.3*    Liver Function Tests: Recent Labs  Lab 02/27/21 1825 02/28/21 0630 03/01/21 0332 03/02/21 0111  AST 16 14*  --   --   ALT 15 15  --   --   ALKPHOS 103 93  --   --   BILITOT 0.5 0.7  --   --   PROT 7.0 6.0*  --   --  ALBUMIN 3.1* 2.7* 2.5* 2.5*    Recent Labs  Lab 02/27/21 1825  LIPASE 64*    No results for input(s): AMMONIA in the last 168 hours. CBC: Recent Labs  Lab 02/27/21 1825 02/28/21 0630 03/01/21 0332 03/02/21 0111  WBC 6.8 6.3 5.6 6.2  NEUTROABS 4.5 4.2  --   --   HGB 9.6* 8.4* 9.0* 9.0*  HCT 28.8* 25.7* 26.9* 27.1*  MCV 80.9 82.4 82.0 82.4  PLT 333 291 279 221    Cardiac Enzymes: Recent Labs  Lab 02/28/21 0005 02/28/21 0630 03/01/21 0332  CKTOTAL 1,116* 969* 774*    CBG: Recent Labs  Lab 03/01/21 0756 03/01/21 1409 03/01/21 1637 03/01/21 2209 03/02/21 0814  GLUCAP 132* 129* 161* 134* 116*     Iron Studies:  Recent Labs    02/28/21 0630  IRON 92   TIBC 234*  FERRITIN 295    Studies/Results: IR US Guide Vasc Access Right  Result Date: 02/28/2021 INDICATION: Renal failure requiring dialysis EXAM: TUNNELED CENTRAL VENOUS HEMODIALYSIS CATHETER PLACEMENT WITH ULTRASOUND AND FLUOROSCOPIC GUIDANCE MEDICATIONS: Ancef 2 gm IV . The antibiotic was given in an appropriate time interval prior to skin puncture. ANESTHESIA/SEDATION: Moderate (conscious) sedation was employed during this procedure. A total of Versed 3 mg and Fentanyl 100 mcg was administered intravenously. Moderate Sedation Time: 13 minutes. The patient's level of consciousness and vital signs were monitored continuously by radiology nursing throughout the procedure under my direct supervision. FLUOROSCOPY TIME:  Fluoroscopy Time: 0 minutes 18 seconds (1 mGy). COMPLICATIONS: None immediate. PROCEDURE: Informed written consent was obtained from the patient after a discussion of the risks, benefits, and alternatives to treatment. Questions regarding the procedure were encouraged and answered. The right neck and chest were prepped with chlorhexidine in a sterile fashion, and a sterile drape was applied covering the operative field. Maximum barrier sterile technique with sterile gowns and gloves were used for the procedure. A timeout was performed prior to the initiation of the procedure. The right internal jugular vein was evaluated with ultrasound and shown to be patent. A permanent ultrasound image was obtained and placed in the patient's medical record. Using sterile gel and a sterile probe cover, the right internal jugular vein was entered with a 21 ga needle during real time ultrasound guidance. 0.018 inch guidewire placed and 21 ga needle exchanged for transitional dilator set. Utilizing fluoroscopy, 0.035 inch guidewire advanced through the needle without difficulty. Seriel dilation was performed and peel-away sheath was placed. Attention then turned to the right anterior upper chest. Following  local lidocaine administration, the hemodialysis catheter was tunneled from the chest wall to the venotomy site. The catheter was inserted through the peel-away sheath. The tip of the catheter was positioned within the right atrium using fluoroscopic guidance. All lumens of the catheter aspirated and flushed well. The dialysis lumens were locked with Heparin. The catheter was secured to the skin with suture. The insertion site was covered with a Biopatch and sterile dressing. IMPRESSION: Successful placement of 23 cm tip to cuff tunneled hemodialysis catheter via the right internal jugular vein with tips terminating within the superior aspect of the right atrium. The catheter is ready for immediate use. Electronically Signed   By: Miachel Roux M.D.   On: 02/28/2021 16:15   VAS [place] UPPER EXT VEIN MAPPING (PRE-OP AVF)  Result Date: 03/01/2021 UPPER EXTREMITY VEIN MAPPING Patient Name:  FORTUNATO KNACKSTEDT  Date of Exam:   03/01/2021 Medical Rec #: [id number]  Accession #:    [id number] Date of Birth: Sep 16, 1995            Patient Gender: M Patient Age:   76Y Exam Location:  [facility] Procedure:      VAS [place] UPPER EXT VEIN MAPPING (PRE-OP AVF) Referring Phys: [id number] Alaysia Lightle PRASAD Elana Jian --------------------------------------------------------------------------------  Indications: Pre-access. Performing Technologist: Rogelia Rohrer RVT, RDMS  Examination Guidelines: A complete evaluation includes B-mode imaging, spectral Doppler, color Doppler, and power Doppler as needed of all accessible portions of each vessel. Bilateral testing is considered an integral part of a complete examination. Limited examinations for reoccurring indications may be performed as noted. +-----------------+-------------+----------+--------+ Right Cephalic   Diameter (cm)Depth (cm)Findings +-----------------+-------------+----------+--------+ Shoulder             0.07        0.28             +-----------------+-------------+----------+--------+ Prox upper arm       0.11        0.20            +-----------------+-------------+----------+--------+ Mid upper arm        0.14        0.20            +-----------------+-------------+----------+--------+ Dist upper arm       0.10        0.27            +-----------------+-------------+----------+--------+ Antecubital fossa    0.15        0.55            +-----------------+-------------+----------+--------+ Prox forearm         0.24        0.55            +-----------------+-------------+----------+--------+ Mid forearm          0.12        0.46            +-----------------+-------------+----------+--------+ Dist forearm         0.16        0.29            +-----------------+-------------+----------+--------+ Wrist                0.13        0.25            +-----------------+-------------+----------+--------+ +-----------------+-------------+----------+---------+ Right Basilic    Diameter (cm)Depth (cm)Findings  +-----------------+-------------+----------+---------+ Prox upper arm       0.36                         +-----------------+-------------+----------+---------+ Mid upper arm        0.36                         +-----------------+-------------+----------+---------+ Dist upper arm       0.26               branching +-----------------+-------------+----------+---------+ Antecubital fossa    0.18               branching +-----------------+-------------+----------+---------+ Prox forearm         0.15                         +-----------------+-------------+----------+---------+ Mid forearm          0.14                         +-----------------+-------------+----------+---------+  Distal forearm       0.15                         +-----------------+-------------+----------+---------+ Wrist                0.13                          +-----------------+-------------+----------+---------+ +-----------------+-------------+----------+---------+ Left Cephalic    Diameter (cm)Depth (cm)Findings  +-----------------+-------------+----------+---------+ Shoulder             0.16        1.16             +-----------------+-------------+----------+---------+ Prox upper arm       0.11        0.55             +-----------------+-------------+----------+---------+ Mid upper arm        0.12        0.59             +-----------------+-------------+----------+---------+ Dist upper arm       0.19        0.74             +-----------------+-------------+----------+---------+ Antecubital fossa    0.21        0.23             +-----------------+-------------+----------+---------+ Prox forearm         0.13        0.40   branching +-----------------+-------------+----------+---------+ Mid forearm          0.11        0.29             +-----------------+-------------+----------+---------+ Dist forearm         0.22        0.24   branching +-----------------+-------------+----------+---------+ Wrist                0.15        0.29   branching +-----------------+-------------+----------+---------+ +-----------------+-------------+----------+---------+ Left Basilic     Diameter (cm)Depth (cm)Findings  +-----------------+-------------+----------+---------+ Prox upper arm       0.50                         +-----------------+-------------+----------+---------+ Mid upper arm        0.43                         +-----------------+-------------+----------+---------+ Dist upper arm       0.36                         +-----------------+-------------+----------+---------+ Antecubital fossa    0.26               branching +-----------------+-------------+----------+---------+ Prox forearm         0.23               branching +-----------------+-------------+----------+---------+ Mid forearm           0.23                         +-----------------+-------------+----------+---------+ Distal forearm       0.27                         +-----------------+-------------+----------+---------+ Wrist  0.24                         +-----------------+-------------+----------+---------+ *See table(s) above for measurements and observations.  Diagnosing physician: Deitra Mayo MD Electronically signed by Deitra Mayo MD on 03/01/2021 at 5:57:43 PM.    Final    IR TUNNELED CENTRAL VENOUS CATHETER PLACEMENT  Result Date: 02/28/2021 INDICATION: Renal failure requiring dialysis EXAM: TUNNELED CENTRAL VENOUS HEMODIALYSIS CATHETER PLACEMENT WITH ULTRASOUND AND FLUOROSCOPIC GUIDANCE MEDICATIONS: Ancef 2 gm IV . The antibiotic was given in an appropriate time interval prior to skin puncture. ANESTHESIA/SEDATION: Moderate (conscious) sedation was employed during this procedure. A total of Versed 3 mg and Fentanyl 100 mcg was administered intravenously. Moderate Sedation Time: 13 minutes. The patient's level of consciousness and vital signs were monitored continuously by radiology nursing throughout the procedure under my direct supervision. FLUOROSCOPY TIME:  Fluoroscopy Time: 0 minutes 18 seconds (1 mGy). COMPLICATIONS: None immediate. PROCEDURE: Informed written consent was obtained from the patient after a discussion of the risks, benefits, and alternatives to treatment. Questions regarding the procedure were encouraged and answered. The right neck and chest were prepped with chlorhexidine in a sterile fashion, and a sterile drape was applied covering the operative field. Maximum barrier sterile technique with sterile gowns and gloves were used for the procedure. A timeout was performed prior to the initiation of the procedure. The right internal jugular vein was evaluated with ultrasound and shown to be patent. A permanent ultrasound image was obtained and placed in the patient's  medical record. Using sterile gel and a sterile probe cover, the right internal jugular vein was entered with a 21 ga needle during real time ultrasound guidance. 0.018 inch guidewire placed and 21 ga needle exchanged for transitional dilator set. Utilizing fluoroscopy, 0.035 inch guidewire advanced through the needle without difficulty. Seriel dilation was performed and peel-away sheath was placed. Attention then turned to the right anterior upper chest. Following local lidocaine administration, the hemodialysis catheter was tunneled from the chest wall to the venotomy site. The catheter was inserted through the peel-away sheath. The tip of the catheter was positioned within the right atrium using fluoroscopic guidance. All lumens of the catheter aspirated and flushed well. The dialysis lumens were locked with Heparin. The catheter was secured to the skin with suture. The insertion site was covered with a Biopatch and sterile dressing. IMPRESSION: Successful placement of 23 cm tip to cuff tunneled hemodialysis catheter via the right internal jugular vein with tips terminating within the superior aspect of the right atrium. The catheter is ready for immediate use. Electronically Signed   By: Miachel Roux M.D.   On: 02/28/2021 16:15    Medications: Infusions:  sodium chloride 75 mL/hr at 02/28/21 0307   ferric gluconate (FERRLECIT) IVPB Stopped (03/01/21 1418)    Scheduled Medications:  amLODipine  10 mg Oral Daily   Chlorhexidine Gluconate Cloth  6 each Topical Q0600   Chlorhexidine Gluconate Cloth  6 each Topical Q0600   Chlorhexidine Gluconate Cloth  6 each Topical Q0600   darbepoetin (ARANESP) injection - DIALYSIS  60 mcg Intravenous Q Tue-HD   feeding supplement (NEPRO CARB STEADY)  237 mL Oral BID BM   heparin  5,000 Units Subcutaneous Q8H   insulin aspart  0-6 Units Subcutaneous TID WC   kidney failure book   Does not apply Once   metoprolol succinate  100 mg Oral Daily   multivitamin  1  tablet Oral QHS  ondansetron (ZOFRAN) IV  4 mg Intravenous Once    have reviewed scheduled and prn medications.  Physical Exam: General:NAD, comfortable Heart:RRR, s1s2 nl Lungs: Clear b/l, no crackle Abdomen:soft, Non-tender, non-distended Extremities: Trace LE edema Dialysis Access: Right IJ TDC.  Genese Quebedeaux Prasad Marshawn Ninneman 03/02/2021,8:27 AM  LOS: 2 days

## 2021-03-02 NOTE — Progress Notes (Signed)
Hemodialysis- Treatment completed without issue. 2L removed as ordered. BP is improved post HD. 188/86. Reported off to primary RN.

## 2021-03-02 NOTE — Progress Notes (Signed)
Rounded on patient today in correlation to transition to outpatient HD. Ordered consult to dietician and Kidney Failure book. Patient educated at the bedside regarding care of tunneled dialysis catheter, assessment of thrill daily and proper medication administration on HD days.  Patient also educated on the importance of adhering to scheduled dialysis treatments, the effects of fluid overload, hyperkalemia and hyperphosphatemia. Patient capable of re-verbalizing via teach back method. Also educated patient on services available through the interdisciplinary team in the clinic setting. Removed patients water pitcher from bedside. Patient with no further questions at this time. Handouts and contact information provided to patient for any further assistance. Will follow as appropriate.   Dorthey Sawyer, RN  Dialysis Nurse Coordinator Phone: [phone]

## 2021-03-03 DIAGNOSIS — N186 End stage renal disease: Secondary | ICD-10-CM

## 2021-03-03 DIAGNOSIS — I120 Hypertensive chronic kidney disease with stage 5 chronic kidney disease or end stage renal disease: Secondary | ICD-10-CM

## 2021-03-03 DIAGNOSIS — E10319 Type 1 diabetes mellitus with unspecified diabetic retinopathy without macular edema: Secondary | ICD-10-CM

## 2021-03-03 LAB — CBC
HCT: 26.1 % — ABNORMAL LOW (ref 39.0–52.0)
Hemoglobin: 8.5 g/dL — ABNORMAL LOW (ref 13.0–17.0)
MCH: 27.1 pg (ref 26.0–34.0)
MCHC: 32.6 g/dL (ref 30.0–36.0)
MCV: 83.1 fL (ref 80.0–100.0)
Platelets: 172 10*3/uL (ref 150–400)
RBC: 3.14 MIL/uL — ABNORMAL LOW (ref 4.22–5.81)
RDW: 12.6 % (ref 11.5–15.5)
WBC: 5.4 10*3/uL (ref 4.0–10.5)
nRBC: 0 % (ref 0.0–0.2)

## 2021-03-03 LAB — RENAL FUNCTION PANEL
Albumin: 2.5 g/dL — ABNORMAL LOW (ref 3.5–5.0)
Anion gap: 12 (ref 5–15)
BUN: 42 mg/dL — ABNORMAL HIGH (ref 6–20)
CO2: 25 mmol/L (ref 22–32)
Calcium: 8.4 mg/dL — ABNORMAL LOW (ref 8.9–10.3)
Chloride: 96 mmol/L — ABNORMAL LOW (ref 98–111)
Creatinine, Ser: 12.61 mg/dL — ABNORMAL HIGH (ref 0.61–1.24)
GFR, Estimated: 5 mL/min — ABNORMAL LOW (ref >60)
Glucose, Bld: 152 mg/dL — ABNORMAL HIGH (ref 70–99)
Phosphorus: 6.3 mg/dL — ABNORMAL HIGH (ref 2.5–4.6)
Potassium: 4.4 mmol/L (ref 3.5–5.1)
Sodium: 133 mmol/L — ABNORMAL LOW (ref 135–145)

## 2021-03-03 LAB — GLUCOSE, CAPILLARY
Glucose-Capillary: 144 mg/dL — ABNORMAL HIGH (ref 70–99)
Glucose-Capillary: 130 mg/dL — ABNORMAL HIGH (ref 70–99)
Glucose-Capillary: 208 mg/dL — ABNORMAL HIGH (ref 70–99)
Glucose-Capillary: 175 mg/dL — ABNORMAL HIGH (ref 70–99)

## 2021-03-03 LAB — HEPATITIS B SURFACE ANTIBODY, QUANTITATIVE: Hep B S AB Quant (Post): <3.1 m[IU]/mL — ABNORMAL LOW (ref >9.9)

## 2021-03-03 MED ORDER — "THROMBI-PAD 3""X3"" EX PADS"
4.0000 | MEDICATED_PAD | Freq: Once | CUTANEOUS | Status: DC
  Filled 2021-03-03: qty 4

## 2021-03-03 MED ORDER — LIDOCAINE HCL 1 % IJ SOLN
INTRAMUSCULAR | Status: AC
  Filled 2021-03-03: qty 20

## 2021-03-03 MED ORDER — CHLORHEXIDINE GLUCONATE CLOTH 2 % EX PADS
6.0000 | MEDICATED_PAD | Freq: Every day | CUTANEOUS | Status: DC
  Administered 2021-03-03 – 2021-03-07 (×5): 6 via TOPICAL

## 2021-03-03 MED ORDER — CALCIUM ACETATE (PHOS BINDER) 667 MG PO CAPS
1334.0000 mg | ORAL_CAPSULE | Freq: Three times a day (TID) | ORAL | Status: DC
  Administered 2021-03-03 – 2021-03-12 (×22): 1334 mg via ORAL
  Filled 2021-03-03 (×23): qty 2

## 2021-03-03 MED ORDER — SODIUM CHLORIDE 0.9 % IV SOLN
1.5000 g | INTRAVENOUS | Status: AC
  Administered 2021-03-04: 1.5 g via INTRAVENOUS
  Filled 2021-03-03: qty 1.5

## 2021-03-03 MED ORDER — AMLODIPINE BESYLATE 10 MG PO TABS
10.0000 mg | ORAL_TABLET | Freq: Every day | ORAL | Status: DC
  Administered 2021-03-03: 10 mg via ORAL
  Filled 2021-03-03: qty 1

## 2021-03-03 MED ORDER — ISOSORBIDE MONONITRATE ER 30 MG PO TB24
30.0000 mg | ORAL_TABLET | Freq: Every day | ORAL | Status: DC
  Administered 2021-03-03 – 2021-03-11 (×9): 30 mg via ORAL
  Filled 2021-03-03 (×9): qty 1

## 2021-03-03 MED ORDER — METOPROLOL SUCCINATE ER 100 MG PO TB24
100.0000 mg | ORAL_TABLET | Freq: Every day | ORAL | Status: DC
  Administered 2021-03-03: 100 mg via ORAL
  Filled 2021-03-03: qty 1

## 2021-03-03 MED ORDER — CLONIDINE HCL 0.2 MG PO TABS
0.2000 mg | ORAL_TABLET | Freq: Three times a day (TID) | ORAL | Status: DC
  Administered 2021-03-03 – 2021-03-06 (×10): 0.2 mg via ORAL
  Filled 2021-03-03 (×10): qty 1

## 2021-03-03 NOTE — Progress Notes (Addendum)
[facility] NEPHROLOGY PROGRESS NOTE  Assessment/ Plan: Pt is a 25 y.o. yo male  with history of type 1 diabetes since very young age, retinopathy, uncontrolled hypertension on metoprolol, CKD 3 and was followed by nephrologist in [facility] with creatinine level 2.10 in 09/2019, presented with severe nausea, vomiting, decreased appetite and weakness for about 6 weeks, seen as a consultation for the management of advanced renal failure. SBP >200 and creatinine level around 26.  #Advanced renal failure most likely progressive CKD in the setting of uncontrolled diabetes and hypertension to new ESRD.  The creatinine level was around 2 back in 2021 and was seen by nephrologist in [facility].  He has uncontrolled diabetes and hypertension with poor follow-up.  Now he presents with around 6 weeks of uremic symptoms with creatinine level peaked at 26.  The CT scan ruled out hydronephrosis/obstruction. Urinalysis with proteinuria UPC 3.7 and microscopic hematuria.  Hep B, hep C, HIV, ANCA, SPEP negative.  ANA positive with unremarkable complements and antidsDNA negative.  Follow-up anti-GBM. The proteinuria due to uncontrolled diabetes.  He received 3 dialysis and tolerated well.  Plan for next treatment tomorrow. I am not sure how much benefit he will get with kidney biopsy at this time, he probably has a lot of chronic finding/scarring.  Patient is hesitant to do biopsy as well.  Vein mapping was done and I am trying to contact vascular surgeon to place permanent access (pt agreed).  Social worker already following to arrange outpatient dialysis. There is some bleeding around the catheter site despite of low BUN.  I am discontinuing subcu heparin.  Discussed with the nurse to apply more invasive tape/gauze.  Informed IR team as well.  #Hypertensive urgency with renal failure: Blood pressure is still elevated.  I will increase clonidine to 0.2 mg 3 times daily.  On amlodipine, Imdur and metoprolol.   Ultrafiltration during dialysis and monitor BP.     #Metabolic acidosis with CKD: Now managed with dialysis.  DC IV sodium bicarbonate.   #Anemia of chronic kidney disease: Iron saturation 39% with serum iron 92. Starting IV iron and ESA.   #Secondary hyperparathyroidism: PTH 259 at goal for CKD.  Start PhosLo for hyperphosphatemia.  Subjective: Seen and examined at bedside.  He tolerated her dialysis yesterday with around 2 L ultrafiltration.  He remains oliguric.  There is some oozing around the catheter site required pressure/gauze.  Denies nausea, vomiting, chest pain, shortness of breath. Objective Vital signs in last 24 hours: Vitals:   03/02/21 1930 03/02/21 2343 03/03/21 0352 03/03/21 0831  BP: (!) 172/93 (!) 195/97 (!) 192/105 (!) 187/94  Pulse: 84 88 80 93  Resp: (!) '21 18 18 17  '$ Temp: 98.5 F (36.9 C) 98.8 F (37.1 C) 98.6 F (37 C) 98.5 F (36.9 C)  TempSrc:  Axillary Axillary Oral  SpO2: 97% 95% 99% 100%  Weight:      Height:       Weight change: 8.4 kg  Intake/Output Summary (Last 24 hours) at 03/03/2021 0900 Last data filed at 03/03/2021 0630 Gross per 24 hour  Intake 1200.54 ml  Output 2000 ml  Net -799.46 ml        Labs: Basic Metabolic Panel: Recent Labs  Lab 03/01/21 0332 03/02/21 0111 03/03/21 0306  NA 138 135 133*  K 3.9 4.2 4.4  CL 100 100 96*  CO2 '22 24 25  '$ GLUCOSE 133* 134* 152*  BUN 92* 67* 42*  CREATININE 21.02* 16.95* 12.61*  CALCIUM 7.2* 7.6* 8.4*  PHOS 7.1* 6.3* 6.3*    Liver Function Tests: Recent Labs  Lab 02/27/21 1825 02/28/21 0630 03/01/21 0332 03/02/21 0111 03/03/21 0306  AST 16 14*  --   --   --   ALT 15 15  --   --   --   ALKPHOS 103 93  --   --   --   BILITOT 0.5 0.7  --   --   --   PROT 7.0 6.0*  --   --   --   ALBUMIN 3.1* 2.7* 2.5* 2.5* 2.5*    Recent Labs  Lab 02/27/21 1825  LIPASE 64*    No results for input(s): AMMONIA in the last 168 hours. CBC: Recent Labs  Lab 02/27/21 1825 02/28/21 0630  03/01/21 0332 03/02/21 0111 03/02/21 1142 03/03/21 0306  WBC 6.8 6.3 5.6 6.2 7.2 5.4  NEUTROABS 4.5 4.2  --   --   --   --   HGB 9.6* 8.4* 9.0* 9.0* 7.9* 8.5*  HCT 28.8* 25.7* 26.9* 27.1* 24.6* 26.1*  MCV 80.9 82.4 82.0 82.4 83.4 83.1  PLT 333 291 279 221 205 172    Cardiac Enzymes: Recent Labs  Lab 02/28/21 0005 02/28/21 0630 03/01/21 0332  CKTOTAL 1,116* 969* 774*    CBG: Recent Labs  Lab 03/01/21 2209 03/02/21 0814 03/02/21 1647 03/02/21 2003 03/03/21 0829  GLUCAP 134* 116* 158* 141* 144*     Iron Studies:  No results for input(s): IRON, TIBC, TRANSFERRIN, FERRITIN in the last 72 hours.  Studies/Results: VAS [place] UPPER EXT VEIN MAPPING (PRE-OP AVF)  Result Date: 03/01/2021 [place] MAPPING Patient Name:  Bobby [name]  Date of Exam:   03/01/2021 Medical Rec #: [id number]           Accession #:    [id number] Date of Birth: 1995-10-13            Patient Gender: M Patient Age:   24Y Exam Location:  [facility] Procedure:      VAS [place] UPPER EXT VEIN MAPPING (PRE-OP AVF) Referring Phys: [id number] Mattelyn Imhoff PRASAD Aylene Acoff --------------------------------------------------------------------------------  Indications: Pre-access. Performing Technologist: Rogelia Rohrer RVT, RDMS  Examination Guidelines: A complete evaluation includes B-mode imaging, spectral Doppler, color Doppler, and power Doppler as needed of all accessible portions of each vessel. Bilateral testing is considered an integral part of a complete examination. Limited examinations for reoccurring indications may be performed as noted. +-----------------+-------------+----------+--------+ Right Cephalic   Diameter (cm)Depth (cm)Findings +-----------------+-------------+----------+--------+ Shoulder             0.07        0.28            +-----------------+-------------+----------+--------+ Prox upper arm       0.11        0.20            +-----------------+-------------+----------+--------+  Mid upper arm        0.14        0.20            +-----------------+-------------+----------+--------+ Dist upper arm       0.10        0.27            +-----------------+-------------+----------+--------+ Antecubital fossa    0.15        0.55            +-----------------+-------------+----------+--------+ Prox forearm         0.24        0.55            +-----------------+-------------+----------+--------+  Mid forearm          0.12        0.46            +-----------------+-------------+----------+--------+ Dist forearm         0.16        0.29            +-----------------+-------------+----------+--------+ Wrist                0.13        0.25            +-----------------+-------------+----------+--------+ +-----------------+-------------+----------+---------+ Right Basilic    Diameter (cm)Depth (cm)Findings  +-----------------+-------------+----------+---------+ Prox upper arm       0.36                         +-----------------+-------------+----------+---------+ Mid upper arm        0.36                         +-----------------+-------------+----------+---------+ Dist upper arm       0.26               branching +-----------------+-------------+----------+---------+ Antecubital fossa    0.18               branching +-----------------+-------------+----------+---------+ Prox forearm         0.15                         +-----------------+-------------+----------+---------+ Mid forearm          0.14                         +-----------------+-------------+----------+---------+ Distal forearm       0.15                         +-----------------+-------------+----------+---------+ Wrist                0.13                         +-----------------+-------------+----------+---------+ +-----------------+-------------+----------+---------+ Left Cephalic    Diameter (cm)Depth (cm)Findings   +-----------------+-------------+----------+---------+ Shoulder             0.16        1.16             +-----------------+-------------+----------+---------+ Prox upper arm       0.11        0.55             +-----------------+-------------+----------+---------+ Mid upper arm        0.12        0.59             +-----------------+-------------+----------+---------+ Dist upper arm       0.19        0.74             +-----------------+-------------+----------+---------+ Antecubital fossa    0.21        0.23             +-----------------+-------------+----------+---------+ Prox forearm         0.13        0.40   branching +-----------------+-------------+----------+---------+ Mid forearm          0.11        0.29             +-----------------+-------------+----------+---------+  Dist forearm         0.22        0.24   branching +-----------------+-------------+----------+---------+ Wrist                0.15        0.29   branching +-----------------+-------------+----------+---------+ +-----------------+-------------+----------+---------+ Left Basilic     Diameter (cm)Depth (cm)Findings  +-----------------+-------------+----------+---------+ Prox upper arm       0.50                         +-----------------+-------------+----------+---------+ Mid upper arm        0.43                         +-----------------+-------------+----------+---------+ Dist upper arm       0.36                         +-----------------+-------------+----------+---------+ Antecubital fossa    0.26               branching +-----------------+-------------+----------+---------+ Prox forearm         0.23               branching +-----------------+-------------+----------+---------+ Mid forearm          0.23                         +-----------------+-------------+----------+---------+ Distal forearm       0.27                          +-----------------+-------------+----------+---------+ Wrist                0.24                         +-----------------+-------------+----------+---------+ *See table(s) above for measurements and observations.  Diagnosing physician: Deitra Mayo MD Electronically signed by Deitra Mayo MD on 03/01/2021 at 5:57:43 PM.    Final     Medications: Infusions:    Scheduled Medications:  amLODipine  10 mg Oral Daily   Chlorhexidine Gluconate Cloth  6 each Topical Q0600   Chlorhexidine Gluconate Cloth  6 each Topical Q0600   Chlorhexidine Gluconate Cloth  6 each Topical Q0600   cloNIDine  0.1 mg Oral TID   darbepoetin (ARANESP) injection - DIALYSIS  60 mcg Intravenous Q Tue-HD   feeding supplement (NEPRO CARB STEADY)  237 mL Oral BID BM   insulin aspart  0-6 Units Subcutaneous TID WC   isosorbide mononitrate  30 mg Oral Daily   kidney failure book   Does not apply Once   metoprolol succinate  100 mg Oral Daily   multivitamin  1 tablet Oral QHS    have reviewed scheduled and prn medications.  Physical Exam: General:NAD, comfortable Heart:RRR, s1s2 nl Lungs: Clear b/l, no crackle Abdomen:soft, Non-tender, non-distended Extremities: Trace LE edema Dialysis Access: Right IJ TDC, some oozing around the site and has dressing on.  Demtrius Rounds Prasad Brnadon Eoff 03/03/2021,9:00 AM  LOS: 3 days

## 2021-03-03 NOTE — Progress Notes (Signed)
Renal Navigator aware that patient would need outpatient HD based on conversation with Dr. Bhandari/Nephrologist earlier this week, however, could not refer until it was decided whether or not he would be treated for AKI or ESRD. Dr. [name] has updated Navigator that patient has now been diagnosed with ESRD. Navigator will proceed with speaking with patient to complete referral for outpatient HD for ESRD treatment. Navigator notes that patient is uninsured, which will delay placement. He also lives closest to [facility], so this may be a referral to [facility]. Navigator will follow closely.  Navigator has spoken with TOC CSW/G. Wierda on patient's unit to request that he contact financial counseling to meet with patient about applying for Medicaid and a meeting with the [facility] for Disability Application.   Alphonzo Cruise, [place] Renal Navigator [phone]

## 2021-03-03 NOTE — Progress Notes (Signed)
Perm cath site was bleeding once again. Pressure was held to the area. After bleeding stopped, dressing was changed using sterile technique. Area was cleaned and the clot that was formed around the exit site was not disturbed. Will continue to monitor  site and tx as indicated.

## 2021-03-03 NOTE — Progress Notes (Signed)
Renal Navigator met with patient at bedside to discuss outpatient HD referral for ESRD. Patient was pleasant and welcoming and states he has spoken with Dr. [name] about his dx. Navigator discussed possible outpatient HD clinic placement options and patient states he would like to continue care with [facility]. Therefore, the closest clinic to patient's home is [facility], on [address]. He understands that this is not the closest clinic to his home, but only a few miles farther away that the closest. He is agreeable. Navigator informed him that he has no insurance on file with the hospital and states that this will delay his referral. He reports that he has Medicaid and that his sister will bring his card to the hospital today so that Navigator can make a copy tomorrow.  Navigator will wait until copy of Medicaid card is available to complete referral, as this should hopefully expedite the acceptance in the long run.  Patient asked about transportation and patient initially states that his sister will take him to/from HD, but once she was on the phone about bring the Medicaid card, she asked if he could get transportation arranged. Navigator informed patient that since he has Medicaid, he can use Medicaid Transportation to outpatient HD, though who to call to arrange will depend on who manages his Medicaid. Navigator provided information on how to enroll/schedule rides and asked if sister could transport until he gets [employer] set up. They agreed.  Navigator will follow up tomorrow.   Alphonzo Cruise, [place] Renal Navigator [phone]

## 2021-03-03 NOTE — Plan of Care (Signed)
  Problem: Education: Goal: Knowledge of General Education information will improve Description: Including pain rating scale, medication(s)/side effects and non-pharmacologic comfort measures Outcome: Progressing  Pt given information on renal failure to read and instructed to ask questions to help improve understanding.

## 2021-03-03 NOTE — Progress Notes (Signed)
PROGRESS NOTE        PATIENT DETAILS Name: Bobby [name] Age: 25 y.o. Sex: male Date of Birth: [birth year] Admit Date: 02/27/2021 Admitting Physician Evalee Mutton Kristeen Mans, MD PCP:Pcp, No  Brief Narrative: Patient is a 25 y.o. male with PMHx DM-1, HTN, CKD stage IIIb-presented with nausea/vomiting/weakness/decreased appetite-found to have progressive CKD with worsening renal failure.  Significant events: 6/26>> admit-nausea/vomiting/weakness-progressive CKD with worsening renal failure. 6/27>>started on HD  Significant studies: 6/26>> CT abdomen/pelvis: No acute intra-abdominal process-no hydronephrosis. 6/27>> A1c: 8.2 6/27>> HIV: Nonreactive 6/27>> ANA: Positive 6/27>> dsDNA:negative 6/27>> SSA antibody, SSB antibody: Negative 6/27>> anticentromere, anti-Jo-1: Negative 6/27>> c-ANCA/p-ANCA titers: Negative 6/27>> hepatitis C antibody: Nonreactive 6/27>> HBsAg: Nonreactive 6/27>> glomerular basement membrane antibody: Pending 6/27>> SPEP: Pending 6/28>> UA:> 300 protein,+RBC  Antimicrobial therapy: None  Microbiology data: 6/26>> COVID PCR: Negative  Procedures : 6/27>> TDC placed by IR.  Consults: Nephrology, IR.  DVT Prophylaxis : Place and maintain sequential compression device Start: 03/03/21 0910   Subjective: Patient in bed, appears comfortable, denies any headache, no fever, no chest pain or pressure, no shortness of breath , no abdominal pain. No new focal weakness.  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  Vein mapping has been done, nephrology following appreciate their input.  Hypertensive urgency: BP remains uncontrolled-increased metoprolol 200 mg daily, clonidine and Norvasc, have also ordered Imdur for better control.  Follow and optimize.     Nausea/vomiting: Due to uremia, negative CT and exam.  Seems to have resolved after HD.  Normocytic anemia: Due to CKD-on Aranesp-watch closely and transfuse if significant drop in hemoglobin.  Nontraumatic rhabdomyolysis: CK downtrending-likely clinically insignificant.  Mildly prolonged QTC: Resolved   DM-1: CBG stable with SSI-follow and adjust.  Recent Labs    03/02/21 1647 03/02/21 2003 03/03/21 0829  GLUCAP 158* 141* 144*     Obesity: Estimated body mass index is 34.77 kg/m as calculated from the following:   Height as of this encounter: '6\' 5"'$  (1.956 m).   Weight as of this encounter: 133 kg.   Diet: Diet Order             Diet renal/carb modified with fluid restriction Fluid restriction: 1200 mL Fluid; Room service appropriate? Yes; Fluid consistency: Thin  Diet effective now                    Code Status: Full code   Family Communication: Sister at bedside on 6/28  Disposition Plan: Status is: Inpatient  The patient will require care spanning > 2 midnights and should be moved to inpatient because: Inpatient level of care appropriate due to severity of illness  Dispo: The patient is from: Home              Anticipated d/c is to: Home              Patient currently is not medically stable to d/c.   Difficult to place patient No   Barriers to Discharge: AKI-on HD-watching for signs of renal recovery-still with uremic symptoms-not stable for discharge.  Antimicrobial agents: Anti-infectives (From admission, onward)    Start     Dose/Rate Route Frequency Ordered Stop   02/28/21 1030  ceFAZolin (ANCEF)  IVPB 2g/100 mL premix        2 g 200 mL/hr over 30 Minutes Intravenous To Radiology 02/28/21 0943 02/28/21 1547        Time spent: 35 minutes-Greater than 50% of this time was spent in counseling, explanation of diagnosis, planning of further management, and coordination of care.  MEDICATIONS: Scheduled Meds:  amLODipine  10 mg Oral Daily    calcium acetate  1,334 mg Oral TID WC   Chlorhexidine Gluconate Cloth  6 each Topical Q0600   Chlorhexidine Gluconate Cloth  6 each Topical Q0600   Chlorhexidine Gluconate Cloth  6 each Topical Q0600   Chlorhexidine Gluconate Cloth  6 each Topical Q0600   cloNIDine  0.2 mg Oral TID   darbepoetin (ARANESP) injection - DIALYSIS  60 mcg Intravenous Q Tue-HD   feeding supplement (NEPRO CARB STEADY)  237 mL Oral BID BM   insulin aspart  0-6 Units Subcutaneous TID WC   isosorbide mononitrate  30 mg Oral Daily   kidney failure book   Does not apply Once   metoprolol succinate  100 mg Oral Daily   multivitamin  1 tablet Oral QHS   Continuous Infusions:   PRN Meds:.acetaminophen **OR** acetaminophen, fentaNYL (SUBLIMAZE) injection, hydrALAZINE, labetalol, oxyCODONE, polyvinyl alcohol   PHYSICAL EXAM: Vital signs: Vitals:   03/02/21 1930 03/02/21 2343 03/03/21 0352 03/03/21 0831  BP: (!) 172/93 (!) 195/97 (!) 192/105 (!) 187/94  Pulse: 84 88 80 93  Resp: (!) '21 18 18 17  '$ Temp: 98.5 F (36.9 C) 98.8 F (37.1 C) 98.6 F (37 C) 98.5 F (36.9 C)  TempSrc:  Axillary Axillary Oral  SpO2: 97% 95% 99% 100%  Weight:      Height:       Filed Weights   03/01/21 1321 03/02/21 1120 03/02/21 1430  Weight: 125.6 kg 135 kg 133 kg   Body mass index is 34.77 kg/m.   Gen Exam:  Awake Alert, No new F.N deficits,  R - IJ HD Cath [place].AT,PERRAL, Supple Neck,No JVD, No cervical lymphadenopathy appriciated.  Symmetrical Chest wall movement, Good air movement bilaterally, CTAB RRR,No Gallops, Rubs or new Murmurs, No Parasternal Heave +ve B.Sounds, Abd Soft, No tenderness, No organomegaly appriciated, No rebound - guarding or rigidity. No Cyanosis, Clubbing or edema, No new Rash or bruise   I have personally reviewed following labs and imaging studies  LABORATORY DATA: CBC: Recent Labs  Lab 02/27/21 1825 02/28/21 0630 03/01/21 0332 03/02/21 0111 03/02/21 1142 03/03/21 0306  WBC 6.8  6.3 5.6 6.2 7.2 5.4  NEUTROABS 4.5 4.2  --   --   --   --   HGB 9.6* 8.4* 9.0* 9.0* 7.9* 8.5*  HCT 28.8* 25.7* 26.9* 27.1* 24.6* 26.1*  MCV 80.9 82.4 82.0 82.4 83.4 83.1  PLT 333 291 279 221 205 [id number]    Basic Metabolic Panel: Recent Labs  Lab 02/28/21 0005 02/28/21 0630 03/01/21 0332 03/02/21 0111 03/03/21 0306  NA 140 141 138 135 133*  K 4.9 4.1 3.9 4.2 4.4  CL 106 105 100 100 96*  CO2 15* 18* '22 24 25  '$ GLUCOSE 102* 118* 133* 134* 152*  BUN 126* 124* 92* 67* 42*  CREATININE 26.08* 25.91* 21.02* 16.95* 12.61*  CALCIUM 7.1* 7.0* 7.2* 7.6* 8.4*  MG  --   --   --  1.7  --   PHOS  --   --  7.1* 6.3* 6.3*    GFR: Estimated Creatinine Clearance: 13.5 mL/min (A) (by C-G formula based  on SCr of 12.61 mg/dL (H)).  Liver Function Tests: Recent Labs  Lab 02/27/21 1825 02/28/21 0630 03/01/21 0332 03/02/21 0111 03/03/21 0306  AST 16 14*  --   --   --   ALT 15 15  --   --   --   ALKPHOS 103 93  --   --   --   BILITOT 0.5 0.7  --   --   --   PROT 7.0 6.0*  --   --   --   ALBUMIN 3.1* 2.7* 2.5* 2.5* 2.5*   Recent Labs  Lab 02/27/21 1825  LIPASE 64*   No results for input(s): AMMONIA in the last 168 hours.  Coagulation Profile: No results for input(s): INR, PROTIME in the last 168 hours.  Cardiac Enzymes: Recent Labs  Lab 02/28/21 0005 02/28/21 0630 03/01/21 0332  CKTOTAL 1,116* 969* 774*    BNP (last 3 results) No results for input(s): PROBNP in the last 8760 hours.  Lipid Profile: No results for input(s): CHOL, HDL, LDLCALC, TRIG, CHOLHDL, LDLDIRECT in the last 72 hours.  Thyroid Function Tests: No results for input(s): TSH, T4TOTAL, FREET4, T3FREE, THYROIDAB in the last 72 hours.  Anemia Panel: No results for input(s): VITAMINB12, FOLATE, FERRITIN, TIBC, IRON, RETICCTPCT in the last 72 hours.  Urine analysis:    Component Value Date/Time   COLORURINE YELLOW 03/01/2021 [place] 03/01/2021 0847   LABSPEC 1.010 03/01/2021 0847    PHURINE 7.0 03/01/2021 0847   GLUCOSEU 150 (A) 03/01/2021 0847   HGBUR SMALL (A) 03/01/2021 0847   BILIRUBINUR NEGATIVE 03/01/2021 0847   KETONESUR 5 (A) 03/01/2021 0847   PROTEINUR >=300 (A) 03/01/2021 0847   NITRITE NEGATIVE 03/01/2021 0847   LEUKOCYTESUR NEGATIVE 03/01/2021 0847    Sepsis Labs: Lactic Acid, Venous No results found for: LATICACIDVEN  MICROBIOLOGY: Recent Results (from the past 240 hour(s))  SARS CORONAVIRUS 2 (TAT 6-24 HRS) Nasopharyngeal Nasopharyngeal Swab     Status: None   Collection Time: 02/27/21  7:42 PM   Specimen: Nasopharyngeal Swab  Result Value Ref Range Status   SARS Coronavirus 2 NEGATIVE NEGATIVE Final    Comment: (NOTE) SARS-CoV-2 target nucleic acids are NOT DETECTED.  The SARS-CoV-2 RNA is generally detectable in upper and lower respiratory specimens during the acute phase of infection. Negative results do not preclude SARS-CoV-2 infection, do not rule out co-infections with other pathogens, and should not be used as the sole basis for treatment or other patient management decisions. Negative results must be combined with clinical observations, patient history, and epidemiological information. The expected result is Negative.  Fact Sheet for Patients: SugarRoll.be  Fact Sheet for Healthcare Providers: https://www.woods-mathews.com/  This test is not yet approved or cleared by the [place] FDA and  has been authorized for detection and/or diagnosis of SARS-CoV-2 by FDA under an Emergency Use Authorization (EUA). This EUA will remain  in effect (meaning this test can be used) for the duration of the COVID-19 declaration under Se ction 564(b)(1) of the Act, 21 U.S.C. section 360bbb-3(b)(1), unless the authorization is terminated or revoked sooner.  Performed at [facility], [place] [address], [place], [place] [address]     RADIOLOGY STUDIES/RESULTS: VAS [place] UPPER EXT VEIN MAPPING  (PRE-OP AVF)  Result Date: 03/01/2021 UPPER EXTREMITY VEIN MAPPING Patient Name:  HUSSIEN BIBA  Date of Exam:   03/01/2021 Medical Rec #: [id number]           Accession #:  [id number] Date of Birth: 04-Mar-1996            Patient Gender: M Patient Age:   30Y Exam Location:  [facility] Procedure:      VAS [place] UPPER EXT VEIN MAPPING (PRE-OP AVF) Referring Phys: [id number] DRON PRASAD BHANDARI --------------------------------------------------------------------------------  Indications: Pre-access. Performing Technologist: Rogelia Rohrer RVT, RDMS  Examination Guidelines: A complete evaluation includes B-mode imaging, spectral Doppler, color Doppler, and power Doppler as needed of all accessible portions of each vessel. Bilateral testing is considered an integral part of a complete examination. Limited examinations for reoccurring indications may be performed as noted. +-----------------+-------------+----------+--------+ Right Cephalic   Diameter (cm)Depth (cm)Findings +-----------------+-------------+----------+--------+ Shoulder             0.07        0.28            +-----------------+-------------+----------+--------+ Prox upper arm       0.11        0.20            +-----------------+-------------+----------+--------+ Mid upper arm        0.14        0.20            +-----------------+-------------+----------+--------+ Dist upper arm       0.10        0.27            +-----------------+-------------+----------+--------+ Antecubital fossa    0.15        0.55            +-----------------+-------------+----------+--------+ Prox forearm         0.24        0.55            +-----------------+-------------+----------+--------+ Mid forearm          0.12        0.46            +-----------------+-------------+----------+--------+ Dist forearm         0.16        0.29            +-----------------+-------------+----------+--------+ Wrist                0.13         0.25            +-----------------+-------------+----------+--------+ +-----------------+-------------+----------+---------+ Right Basilic    Diameter (cm)Depth (cm)Findings  +-----------------+-------------+----------+---------+ Prox upper arm       0.36                         +-----------------+-------------+----------+---------+ Mid upper arm        0.36                         +-----------------+-------------+----------+---------+ Dist upper arm       0.26               branching +-----------------+-------------+----------+---------+ Antecubital fossa    0.18               branching +-----------------+-------------+----------+---------+ Prox forearm         0.15                         +-----------------+-------------+----------+---------+ Mid forearm          0.14                         +-----------------+-------------+----------+---------+  Distal forearm       0.15                         +-----------------+-------------+----------+---------+ Wrist                0.13                         +-----------------+-------------+----------+---------+ +-----------------+-------------+----------+---------+ Left Cephalic    Diameter (cm)Depth (cm)Findings  +-----------------+-------------+----------+---------+ Shoulder             0.16        1.16             +-----------------+-------------+----------+---------+ Prox upper arm       0.11        0.55             +-----------------+-------------+----------+---------+ Mid upper arm        0.12        0.59             +-----------------+-------------+----------+---------+ Dist upper arm       0.19        0.74             +-----------------+-------------+----------+---------+ Antecubital fossa    0.21        0.23             +-----------------+-------------+----------+---------+ Prox forearm         0.13        0.40   branching +-----------------+-------------+----------+---------+ Mid  forearm          0.11        0.29             +-----------------+-------------+----------+---------+ Dist forearm         0.22        0.24   branching +-----------------+-------------+----------+---------+ Wrist                0.15        0.29   branching +-----------------+-------------+----------+---------+ +-----------------+-------------+----------+---------+ Left Basilic     Diameter (cm)Depth (cm)Findings  +-----------------+-------------+----------+---------+ Prox upper arm       0.50                         +-----------------+-------------+----------+---------+ Mid upper arm        0.43                         +-----------------+-------------+----------+---------+ Dist upper arm       0.36                         +-----------------+-------------+----------+---------+ Antecubital fossa    0.26               branching +-----------------+-------------+----------+---------+ Prox forearm         0.23               branching +-----------------+-------------+----------+---------+ Mid forearm          0.23                         +-----------------+-------------+----------+---------+ Distal forearm       0.27                         +-----------------+-------------+----------+---------+ Wrist  0.24                         +-----------------+-------------+----------+---------+ *See table(s) above for measurements and observations.  Diagnosing physician: Deitra Mayo MD Electronically signed by Deitra Mayo MD on 03/01/2021 at 5:57:43 PM.    Final      LOS: 3 days   Signature  Lala Lund M.D on 03/03/2021 at 9:43 AM   -  To page go to [email or web address]

## 2021-03-03 NOTE — H&P (View-Only) (Signed)
CONSULT NOTE   MRN : [id number]  Reason for Consult: ESRD needs permanent access Referring Physician: Dr. [name]  History of Present Illness: 25 y/o [name] now in ESRD on HD via [facility] placed by Dr. [name] 02/28/21.    Past medical history includes: type 1 diabetes since very young age, uncontrolled with retinopathy, uncontrolled hypertension on metoprolol.  He is right hand dominant and has no chest wall implants.     Current Facility-Administered Medications  Medication Dose Route Frequency Provider Last Rate Last Admin   acetaminophen (TYLENOL) tablet 650 mg  650 mg Oral Q6H PRN Reubin Milan, MD   650 mg at 03/01/21 [id number]   Or   acetaminophen (TYLENOL) suppository 650 mg  650 mg Rectal Q6H PRN Reubin Milan, MD       amLODipine (NORVASC) tablet 10 mg  10 mg Oral Daily Thurnell Lose, MD       calcium acetate (PHOSLO) capsule 1,334 mg  1,334 mg Oral TID WC Rosita Fire, MD       Chlorhexidine Gluconate Cloth 2 % PADS 6 each  6 each Topical Q0600 Rosita Fire, MD   6 each at 03/02/21 0533   Chlorhexidine Gluconate Cloth 2 % PADS 6 each  6 each Topical Q0600 Rosita Fire, MD   6 each at 03/02/21 0510   Chlorhexidine Gluconate Cloth 2 % PADS 6 each  6 each Topical Q0600 Rosita Fire, MD   6 each at 03/03/21 [phone]   Chlorhexidine Gluconate Cloth 2 % PADS 6 each  6 each Topical Q0600 Rosita Fire, MD       cloNIDine (CATAPRES) tablet 0.2 mg  0.2 mg Oral TID Rosita Fire, MD       Darbepoetin Alfa (ARANESP) injection 60 mcg  60 mcg Intravenous Q Tue-HD Rosita Fire, MD   60 mcg at 03/01/21 1204   feeding supplement (NEPRO CARB STEADY) liquid 237 mL  237 mL Oral BID BM Jonetta Osgood, MD   237 mL at 03/02/21 1537   fentaNYL (SUBLIMAZE) injection 25 mcg  25 mcg Intravenous Q4H PRN Jonetta Osgood, MD   25 mcg at 02/28/21 2051   hydrALAZINE (APRESOLINE) injection 20 mg  20 mg Intravenous Q4H PRN Jonetta Osgood, MD   20 mg at 03/02/21 [id number]   insulin aspart (novoLOG) injection 0-6 Units  0-6 Units Subcutaneous TID WC Jonetta Osgood, MD   1 Units at 03/02/21 1700   isosorbide mononitrate (IMDUR) 24 hr tablet 30 mg  30 mg Oral Daily Thurnell Lose, MD       kidney failure book   Does not apply Once Rosita Fire, MD       labetalol (NORMODYNE) injection 10 mg  10 mg Intravenous Q2H PRN Jonetta Osgood, MD       metoprolol succinate (TOPROL-XL) 24 hr tablet 100 mg  100 mg Oral Daily Thurnell Lose, MD       multivitamin (RENA-VIT) tablet 1 tablet  1 tablet Oral QHS Jonetta Osgood, MD   1 tablet at 03/02/21 2132   oxyCODONE (Oxy IR/ROXICODONE) immediate release tablet 5 mg  5 mg Oral Q6H PRN Jonetta Osgood, MD   5 mg at 03/02/21 1538   polyvinyl alcohol (LIQUIFILM TEARS) 1.4 % ophthalmic solution 1 drop  1 drop Both Eyes PRN Ghimire, Henreitta Leber, MD        Pt meds include: Statin :No Betablocker:  Yes ASA: No Other anticoagulants/antiplatelets:   Past Medical History:  Diagnosis Date   Diabetes mellitus without complication ([place])    Hypertension     Past Surgical History:  Procedure Laterality Date   IR PERC TUN PERIT CATH WO PORT S&I /IMAG  02/28/2021   IR US GUIDE VASC ACCESS RIGHT  02/28/2021    Social History Social History   Tobacco Use   Smoking status: Never  Substance Use Topics   Alcohol use: Never   Drug use: Yes    Types: Marijuana    Comment: daily    Family History Family History  Problem Relation Age of Onset   Diabetes Mother    Kidney disease Other    Hypertension Other     No Known Allergies   REVIEW OF SYSTEMS  General: '[ ]'$  Weight loss, '[ ]'$  Fever, '[ ]'$  chills Neurologic: '[ ]'$  Dizziness, '[ ]'$  Blackouts, '[ ]'$  Seizure '[ ]'$  Stroke, '[ ]'$  "Mini stroke", '[ ]'$  Slurred speech, '[ ]'$  Temporary blindness; '[ ]'$  weakness in arms or legs, '[ ]'$  Hoarseness '[ ]'$  Dysphagia Cardiac: '[ ]'$  Chest pain/pressure, '[ ]'$  Shortness of breath at rest '[ ]'$   Shortness of breath with exertion, '[ ]'$  Atrial fibrillation or irregular heartbeat  Vascular: '[ ]'$  Pain in legs with walking, '[ ]'$  Pain in legs at rest, '[ ]'$  Pain in legs at night,  '[ ]'$  Non-healing ulcer, '[ ]'$  Blood clot in vein/DVT,   Pulmonary: '[ ]'$  Home oxygen, '[ ]'$  Productive cough, '[ ]'$  Coughing up blood, '[ ]'$  Asthma,  '[ ]'$  Wheezing '[ ]'$  COPD Musculoskeletal:  '[ ]'$  Arthritis, '[ ]'$  Low back pain, '[ ]'$  Joint pain Hematologic: '[ ]'$  Easy Bruising, '[ ]'$  Anemia; '[ ]'$  Hepatitis Gastrointestinal: '[ ]'$  Blood in stool, '[ ]'$  Gastroesophageal Reflux/heartburn, Urinary: '[ ]'$  chronic Kidney disease, '[ ]'$  on HD - '[ ]'$  MWF or '[ ]'$  TTHS, '[ ]'$  Burning with urination, '[ ]'$  Difficulty urinating Skin: '[ ]'$  Rashes, '[ ]'$  Wounds Psychological: '[ ]'$  Anxiety, '[ ]'$  Depression  Physical Examination Vitals:   03/02/21 1930 03/02/21 2343 03/03/21 0352 03/03/21 0831  BP: (!) 172/93 (!) 195/97 (!) 192/105 (!) 187/94  Pulse: 84 88 80 93  Resp: (!) '21 18 18 17  '$ Temp: 98.5 F (36.9 C) 98.8 F (37.1 C) 98.6 F (37 C) 98.5 F (36.9 C)  TempSrc:  Axillary Axillary Oral  SpO2: 97% 95% 99% 100%  Weight:      Height:       Body mass index is 34.77 kg/m.  General:  WDWN in NAD Gait: Normal HENT: WNL Eyes: Pupils equal Pulmonary: normal non-labored breathing , without Rales, rhonchi,  wheezing Cardiac: RRR, without  Murmurs, rubs or gallops; No carotid bruits Abdomen: soft, NT, no masses Skin: no rashes, ulcers noted;  no Gangrene , no cellulitis; no open wounds;   Vascular Exam/Pulses:Palpable radial and brachial pulses   Musculoskeletal: no muscle wasting or atrophy; no edema  Neurologic: A&O X 3; Appropriate Affect ;  SENSATION: normal; MOTOR FUNCTION: 5/5 Symmetric Speech is fluent/normal   Significant Diagnostic Studies: CBC Lab Results  Component Value Date   WBC 5.4 03/03/2021   HGB 8.5 (L) 03/03/2021   HCT 26.1 (L) 03/03/2021   MCV 83.1 03/03/2021   PLT 172 03/03/2021    BMET    Component Value Date/Time   NA  133 (L) 03/03/2021 0306   K 4.4 03/03/2021 0306   CL 96 (L) 03/03/2021 0306   CO2  25 03/03/2021 0306   GLUCOSE 152 (H) 03/03/2021 0306   BUN 42 (H) 03/03/2021 0306   CREATININE 12.61 (H) 03/03/2021 0306   CALCIUM 8.4 (L) 03/03/2021 0306   GFRNONAA 5 (L) 03/03/2021 0306   Estimated Creatinine Clearance: 13.5 mL/min (A) (by C-G formula based on SCr of 12.61 mg/dL (H)).  COAG No results found for: INR, PROTIME   Non-Invasive Vascular Imaging:   +-----------------+-------------+----------+--------+  Right Cephalic   Diameter (cm)Depth (cm)Findings  +-----------------+-------------+----------+--------+  Shoulder             0.07        0.28             +-----------------+-------------+----------+--------+  Prox upper arm       0.11        0.20             +-----------------+-------------+----------+--------+  Mid upper arm        0.14        0.20             +-----------------+-------------+----------+--------+  Dist upper arm       0.10        0.27             +-----------------+-------------+----------+--------+  Antecubital fossa    0.15        0.55             +-----------------+-------------+----------+--------+  Prox forearm         0.24        0.55             +-----------------+-------------+----------+--------+  Mid forearm          0.12        0.46             +-----------------+-------------+----------+--------+  Dist forearm         0.16        0.29             +-----------------+-------------+----------+--------+  Wrist                0.13        0.25             +-----------------+-------------+----------+--------+   +-----------------+-------------+----------+---------+  Right Basilic    Diameter (cm)Depth (cm)Findings   +-----------------+-------------+----------+---------+  Prox upper arm       0.36                          +-----------------+-------------+----------+---------+  Mid upper arm         0.36                          +-----------------+-------------+----------+---------+  Dist upper arm       0.26               branching  +-----------------+-------------+----------+---------+  Antecubital fossa    0.18               branching  +-----------------+-------------+----------+---------+  Prox forearm         0.15                          +-----------------+-------------+----------+---------+  Mid forearm          0.14                          +-----------------+-------------+----------+---------+  Distal forearm       0.15                          +-----------------+-------------+----------+---------+  Wrist                0.13                          +-----------------+-------------+----------+---------+   +-----------------+-------------+----------+---------+  Left Cephalic    Diameter (cm)Depth (cm)Findings   +-----------------+-------------+----------+---------+  Shoulder             0.16        1.16              +-----------------+-------------+----------+---------+  Prox upper arm       0.11        0.55              +-----------------+-------------+----------+---------+  Mid upper arm        0.12        0.59              +-----------------+-------------+----------+---------+  Dist upper arm       0.19        0.74              +-----------------+-------------+----------+---------+  Antecubital fossa    0.21        0.23              +-----------------+-------------+----------+---------+  Prox forearm         0.13        0.40   branching  +-----------------+-------------+----------+---------+  Mid forearm          0.11        0.29              +-----------------+-------------+----------+---------+  Dist forearm         0.22        0.24   branching  +-----------------+-------------+----------+---------+  Wrist                0.15        0.29   branching   +-----------------+-------------+----------+---------+   +-----------------+-------------+----------+---------+  Left Basilic     Diameter (cm)Depth (cm)Findings   +-----------------+-------------+----------+---------+  Prox upper arm       0.50                          +-----------------+-------------+----------+---------+  Mid upper arm        0.43                          +-----------------+-------------+----------+---------+  Dist upper arm       0.36                          +-----------------+-------------+----------+---------+  Antecubital fossa    0.26               branching  +-----------------+-------------+----------+---------+  Prox forearm         0.23               branching  +-----------------+-------------+----------+---------+  Mid forearm          0.23                          +-----------------+-------------+----------+---------+  Distal forearm  0.27                          +-----------------+-------------+----------+---------+  Wrist                0.24                          +-----------------+-------------+----------+---------+   ASSESSMENT/PLAN:  ESRD The vein mapping shows acceptable left basilic vein. NPO past MN plan for left AV fistula verse graft 03/04/21.   Discussed surgical procedure with the patient and that he will need a second surgery to transpose the basilic vein in the future.  He agrees to proceed with surgery.   Roxy Horseman 03/03/2021 9:33 AM  I have independently interviewed and examined patient and agree with PA assessment and plan above. Plan for left arm access with Dr. Stanford Breed tomorrow in the OR.   Ellenor Wisniewski C. Donzetta Matters, MD [facility] Office: [phone] Pager: [phone]

## 2021-03-03 NOTE — TOC Progression Note (Signed)
Transition of Care [facility]) - Progression Note    Patient Details  Name: Yussuf Astacio MRN: [id number] Date of Birth: Mar 15, 1996  [facility]) CM/SW Contact  Joanne Chars, LCSW Phone Number: 03/03/2021, 9:32 AM  Clinical Narrative:   CSW sent email to [employer] with financial counseling regarding dx of ESRD and requesting eval for medicaid/disability.         Expected Discharge Plan and Services                                                 Social Determinants of Health (SDOH) Interventions    Readmission Risk Interventions No flowsheet data found.

## 2021-03-03 NOTE — Procedures (Signed)
.   Called by nephrology re: HD catheter bleeding at site. Apparently since placement.  Patient also continued to receive SQ Heparin as ordered.  On exam, existing dressings bloody with blood clotted on catheter and running out from under dressings.  Dressings removed. Skin around catheter prepped with Betadine swabs x 3.  Sterile forceps used to carefully remove clotted blood from around catheter and the exit site.  There was brisk bleeding noted.  I used a 0 Prolene suture and placed a figure 8 suture around the skin and catheter to achieve hemostasis.  I monitored the site for about 5 more minutes, there was no evidence of bleeding.  Clean dry dressing and Tegaderm placed.  The figure 8 suture can be removed by RN next Monday or Tuesday at Dialysis when dressing changed (or next routine dressing change after Monday). Order placed.  Lyndell Allaire S Yonas Bunda PA-C 03/03/2021 11:31 AM

## 2021-03-03 NOTE — Progress Notes (Signed)
Pt has temporary cath in the (R) chest area. Area was noted to have fresh blood exiting the dressing. Pressure dressing was applied to the cath exit site over the existing dressing. Will continue to monitor and tx as needed.

## 2021-03-03 NOTE — Consult Note (Addendum)
CONSULT NOTE   MRN : [id number]  Reason for Consult: ESRD needs permanent access Referring Physician: Dr. [name]  History of Present Illness: 25 y/o male now in ESRD on HD via [facility] placed by Dr. [name] 02/28/21.    Past medical history includes: type 1 diabetes since very young age, uncontrolled with retinopathy, uncontrolled hypertension on metoprolol.  He is right hand dominant and has no chest wall implants.     Current Facility-Administered Medications  Medication Dose Route Frequency Provider Last Rate Last Admin   acetaminophen (TYLENOL) tablet 650 mg  650 mg Oral Q6H PRN Reubin Milan, MD   650 mg at 03/01/21 [id number]   Or   acetaminophen (TYLENOL) suppository 650 mg  650 mg Rectal Q6H PRN Reubin Milan, MD       amLODipine (NORVASC) tablet 10 mg  10 mg Oral Daily Thurnell Lose, MD       calcium acetate (PHOSLO) capsule 1,334 mg  1,334 mg Oral TID WC Rosita Fire, MD       Chlorhexidine Gluconate Cloth 2 % PADS 6 each  6 each Topical Q0600 Rosita Fire, MD   6 each at 03/02/21 0533   Chlorhexidine Gluconate Cloth 2 % PADS 6 each  6 each Topical Q0600 Rosita Fire, MD   6 each at 03/02/21 0510   Chlorhexidine Gluconate Cloth 2 % PADS 6 each  6 each Topical Q0600 Rosita Fire, MD   6 each at 03/03/21 [phone]   Chlorhexidine Gluconate Cloth 2 % PADS 6 each  6 each Topical Q0600 Rosita Fire, MD       cloNIDine (CATAPRES) tablet 0.2 mg  0.2 mg Oral TID Rosita Fire, MD       Darbepoetin Alfa (ARANESP) injection 60 mcg  60 mcg Intravenous Q Tue-HD Rosita Fire, MD   60 mcg at 03/01/21 1204   feeding supplement (NEPRO CARB STEADY) liquid 237 mL  237 mL Oral BID BM Jonetta Osgood, MD   237 mL at 03/02/21 1537   fentaNYL (SUBLIMAZE) injection 25 mcg  25 mcg Intravenous Q4H PRN Jonetta Osgood, MD   25 mcg at 02/28/21 2051   hydrALAZINE (APRESOLINE) injection 20 mg  20 mg Intravenous Q4H PRN Jonetta Osgood, MD   20 mg at 03/02/21 [id number]   insulin aspart (novoLOG) injection 0-6 Units  0-6 Units Subcutaneous TID WC Jonetta Osgood, MD   1 Units at 03/02/21 1700   isosorbide mononitrate (IMDUR) 24 hr tablet 30 mg  30 mg Oral Daily Thurnell Lose, MD       kidney failure book   Does not apply Once Rosita Fire, MD       labetalol (NORMODYNE) injection 10 mg  10 mg Intravenous Q2H PRN Jonetta Osgood, MD       metoprolol succinate (TOPROL-XL) 24 hr tablet 100 mg  100 mg Oral Daily Thurnell Lose, MD       multivitamin (RENA-VIT) tablet 1 tablet  1 tablet Oral QHS Jonetta Osgood, MD   1 tablet at 03/02/21 2132   oxyCODONE (Oxy IR/ROXICODONE) immediate release tablet 5 mg  5 mg Oral Q6H PRN Jonetta Osgood, MD   5 mg at 03/02/21 1538   polyvinyl alcohol (LIQUIFILM TEARS) 1.4 % ophthalmic solution 1 drop  1 drop Both Eyes PRN Ghimire, Henreitta Leber, MD        Pt meds include: Statin :No Betablocker:  Yes ASA: No Other anticoagulants/antiplatelets:   Past Medical History:  Diagnosis Date   Diabetes mellitus without complication ([place])    Hypertension     Past Surgical History:  Procedure Laterality Date   IR PERC TUN PERIT CATH WO PORT S&I /IMAG  02/28/2021   IR US GUIDE VASC ACCESS RIGHT  02/28/2021    Social History Social History   Tobacco Use   Smoking status: Never  Substance Use Topics   Alcohol use: Never   Drug use: Yes    Types: Marijuana    Comment: daily    Family History Family History  Problem Relation Age of Onset   Diabetes Mother    Kidney disease Other    Hypertension Other     No Known Allergies   REVIEW OF SYSTEMS  General: '[ ]'$  Weight loss, '[ ]'$  Fever, '[ ]'$  chills Neurologic: '[ ]'$  Dizziness, '[ ]'$  Blackouts, '[ ]'$  Seizure '[ ]'$  Stroke, '[ ]'$  "Mini stroke", '[ ]'$  Slurred speech, '[ ]'$  Temporary blindness; '[ ]'$  weakness in arms or legs, '[ ]'$  Hoarseness '[ ]'$  Dysphagia Cardiac: '[ ]'$  Chest pain/pressure, '[ ]'$  Shortness of breath at rest '[ ]'$   Shortness of breath with exertion, '[ ]'$  Atrial fibrillation or irregular heartbeat  Vascular: '[ ]'$  Pain in legs with walking, '[ ]'$  Pain in legs at rest, '[ ]'$  Pain in legs at night,  '[ ]'$  Non-healing ulcer, '[ ]'$  Blood clot in vein/DVT,   Pulmonary: '[ ]'$  Home oxygen, '[ ]'$  Productive cough, '[ ]'$  Coughing up blood, '[ ]'$  Asthma,  '[ ]'$  Wheezing '[ ]'$  COPD Musculoskeletal:  '[ ]'$  Arthritis, '[ ]'$  Low back pain, '[ ]'$  Joint pain Hematologic: '[ ]'$  Easy Bruising, '[ ]'$  Anemia; '[ ]'$  Hepatitis Gastrointestinal: '[ ]'$  Blood in stool, '[ ]'$  Gastroesophageal Reflux/heartburn, Urinary: '[ ]'$  chronic Kidney disease, '[ ]'$  on HD - '[ ]'$  MWF or '[ ]'$  TTHS, '[ ]'$  Burning with urination, '[ ]'$  Difficulty urinating Skin: '[ ]'$  Rashes, '[ ]'$  Wounds Psychological: '[ ]'$  Anxiety, '[ ]'$  Depression  Physical Examination Vitals:   03/02/21 1930 03/02/21 2343 03/03/21 0352 03/03/21 0831  BP: (!) 172/93 (!) 195/97 (!) 192/105 (!) 187/94  Pulse: 84 88 80 93  Resp: (!) '21 18 18 17  '$ Temp: 98.5 F (36.9 C) 98.8 F (37.1 C) 98.6 F (37 C) 98.5 F (36.9 C)  TempSrc:  Axillary Axillary Oral  SpO2: 97% 95% 99% 100%  Weight:      Height:       Body mass index is 34.77 kg/m.  General:  WDWN in NAD Gait: Normal HENT: WNL Eyes: Pupils equal Pulmonary: normal non-labored breathing , without Rales, rhonchi,  wheezing Cardiac: RRR, without  Murmurs, rubs or gallops; No carotid bruits Abdomen: soft, NT, no masses Skin: no rashes, ulcers noted;  no Gangrene , no cellulitis; no open wounds;   Vascular Exam/Pulses:Palpable radial and brachial pulses   Musculoskeletal: no muscle wasting or atrophy; no edema  Neurologic: A&O X 3; Appropriate Affect ;  SENSATION: normal; MOTOR FUNCTION: 5/5 Symmetric Speech is fluent/normal   Significant Diagnostic Studies: CBC Lab Results  Component Value Date   WBC 5.4 03/03/2021   HGB 8.5 (L) 03/03/2021   HCT 26.1 (L) 03/03/2021   MCV 83.1 03/03/2021   PLT 172 03/03/2021    BMET    Component Value Date/Time   NA  133 (L) 03/03/2021 0306   K 4.4 03/03/2021 0306   CL 96 (L) 03/03/2021 0306   CO2  25 03/03/2021 0306   GLUCOSE 152 (H) 03/03/2021 0306   BUN 42 (H) 03/03/2021 0306   CREATININE 12.61 (H) 03/03/2021 0306   CALCIUM 8.4 (L) 03/03/2021 0306   GFRNONAA 5 (L) 03/03/2021 0306   Estimated Creatinine Clearance: 13.5 mL/min (A) (by C-G formula based on SCr of 12.61 mg/dL (H)).  COAG No results found for: INR, PROTIME   Non-Invasive Vascular Imaging:   +-----------------+-------------+----------+--------+  Right Cephalic   Diameter (cm)Depth (cm)Findings  +-----------------+-------------+----------+--------+  Shoulder             0.07        0.28             +-----------------+-------------+----------+--------+  Prox upper arm       0.11        0.20             +-----------------+-------------+----------+--------+  Mid upper arm        0.14        0.20             +-----------------+-------------+----------+--------+  Dist upper arm       0.10        0.27             +-----------------+-------------+----------+--------+  Antecubital fossa    0.15        0.55             +-----------------+-------------+----------+--------+  Prox forearm         0.24        0.55             +-----------------+-------------+----------+--------+  Mid forearm          0.12        0.46             +-----------------+-------------+----------+--------+  Dist forearm         0.16        0.29             +-----------------+-------------+----------+--------+  Wrist                0.13        0.25             +-----------------+-------------+----------+--------+   +-----------------+-------------+----------+---------+  Right Basilic    Diameter (cm)Depth (cm)Findings   +-----------------+-------------+----------+---------+  Prox upper arm       0.36                          +-----------------+-------------+----------+---------+  Mid upper arm         0.36                          +-----------------+-------------+----------+---------+  Dist upper arm       0.26               branching  +-----------------+-------------+----------+---------+  Antecubital fossa    0.18               branching  +-----------------+-------------+----------+---------+  Prox forearm         0.15                          +-----------------+-------------+----------+---------+  Mid forearm          0.14                          +-----------------+-------------+----------+---------+  Distal forearm       0.15                          +-----------------+-------------+----------+---------+  Wrist                0.13                          +-----------------+-------------+----------+---------+   +-----------------+-------------+----------+---------+  Left Cephalic    Diameter (cm)Depth (cm)Findings   +-----------------+-------------+----------+---------+  Shoulder             0.16        1.16              +-----------------+-------------+----------+---------+  Prox upper arm       0.11        0.55              +-----------------+-------------+----------+---------+  Mid upper arm        0.12        0.59              +-----------------+-------------+----------+---------+  Dist upper arm       0.19        0.74              +-----------------+-------------+----------+---------+  Antecubital fossa    0.21        0.23              +-----------------+-------------+----------+---------+  Prox forearm         0.13        0.40   branching  +-----------------+-------------+----------+---------+  Mid forearm          0.11        0.29              +-----------------+-------------+----------+---------+  Dist forearm         0.22        0.24   branching  +-----------------+-------------+----------+---------+  Wrist                0.15        0.29   branching   +-----------------+-------------+----------+---------+   +-----------------+-------------+----------+---------+  Left Basilic     Diameter (cm)Depth (cm)Findings   +-----------------+-------------+----------+---------+  Prox upper arm       0.50                          +-----------------+-------------+----------+---------+  Mid upper arm        0.43                          +-----------------+-------------+----------+---------+  Dist upper arm       0.36                          +-----------------+-------------+----------+---------+  Antecubital fossa    0.26               branching  +-----------------+-------------+----------+---------+  Prox forearm         0.23               branching  +-----------------+-------------+----------+---------+  Mid forearm          0.23                          +-----------------+-------------+----------+---------+  Distal forearm  0.27                          +-----------------+-------------+----------+---------+  Wrist                0.24                          +-----------------+-------------+----------+---------+   ASSESSMENT/PLAN:  ESRD The vein mapping shows acceptable left basilic vein. NPO past MN plan for left AV fistula verse graft 03/04/21.   Discussed surgical procedure with the patient and that he will need a second surgery to transpose the basilic vein in the future.  He agrees to proceed with surgery.   Roxy Horseman 03/03/2021 9:33 AM  I have independently interviewed and examined patient and agree with PA assessment and plan above. Plan for left arm access with Dr. Stanford Breed tomorrow in the OR.   Chrishaun Sasso C. Donzetta Matters, MD [facility] Office: [phone] Pager: [phone]

## 2021-03-04 ENCOUNTER — Inpatient Hospital Stay (HOSPITAL_COMMUNITY): Payer: Medicaid Other | Admitting: Anesthesiology

## 2021-03-04 ENCOUNTER — Encounter (HOSPITAL_COMMUNITY)
Admission: EM | Disposition: A | Discharge status: Home / Self Care | Payer: Self-pay | Source: Home / Self Care | Attending: Internal Medicine

## 2021-03-04 ENCOUNTER — Encounter (HOSPITAL_COMMUNITY): Payer: Self-pay | Admitting: Internal Medicine

## 2021-03-04 DIAGNOSIS — N186 End stage renal disease: Secondary | ICD-10-CM

## 2021-03-04 HISTORY — PX: AV FISTULA PLACEMENT: SHX1204

## 2021-03-04 LAB — GLUCOSE, CAPILLARY
Glucose-Capillary: 135 mg/dL — ABNORMAL HIGH (ref 70–99)
Glucose-Capillary: 138 mg/dL — ABNORMAL HIGH (ref 70–99)
Glucose-Capillary: 146 mg/dL — ABNORMAL HIGH (ref 70–99)
Glucose-Capillary: 137 mg/dL — ABNORMAL HIGH (ref 70–99)
Glucose-Capillary: 216 mg/dL — ABNORMAL HIGH (ref 70–99)
Glucose-Capillary: 134 mg/dL — ABNORMAL HIGH (ref 70–99)

## 2021-03-04 LAB — RENAL FUNCTION PANEL
Albumin: 2.3 g/dL — ABNORMAL LOW (ref 3.5–5.0)
Anion gap: 11 (ref 5–15)
BUN: 59 mg/dL — ABNORMAL HIGH (ref 6–20)
CO2: 24 mmol/L (ref 22–32)
Calcium: 8.1 mg/dL — ABNORMAL LOW (ref 8.9–10.3)
Chloride: 100 mmol/L (ref 98–111)
Creatinine, Ser: 14.31 mg/dL — ABNORMAL HIGH (ref 0.61–1.24)
GFR, Estimated: 4 mL/min — ABNORMAL LOW (ref >60)
Glucose, Bld: 185 mg/dL — ABNORMAL HIGH (ref 70–99)
Phosphorus: 6.7 mg/dL — ABNORMAL HIGH (ref 2.5–4.6)
Potassium: 5.3 mmol/L — ABNORMAL HIGH (ref 3.5–5.1)
Sodium: 135 mmol/L (ref 135–145)

## 2021-03-04 SURGERY — ARTERIOVENOUS (AV) FISTULA CREATION
Anesthesia: Monitor Anesthesia Care | Site: Arm Upper | Laterality: Left

## 2021-03-04 MED ORDER — FENTANYL CITRATE (PF) 100 MCG/2ML IJ SOLN: 25.0000 ug | INTRAMUSCULAR | Status: DC | PRN

## 2021-03-04 MED ORDER — LIDOCAINE 2% (20 MG/ML) 5 ML SYRINGE
INTRAMUSCULAR | Status: AC
  Filled 2021-03-04: qty 5

## 2021-03-04 MED ORDER — FENTANYL CITRATE (PF) 100 MCG/2ML IJ SOLN
INTRAMUSCULAR | Status: AC
  Filled 2021-03-04: qty 2

## 2021-03-04 MED ORDER — PROPOFOL 10 MG/ML IV BOLUS
INTRAVENOUS | Status: AC
  Filled 2021-03-04: qty 20

## 2021-03-04 MED ORDER — LIDOCAINE 2% (20 MG/ML) 5 ML SYRINGE
INTRAMUSCULAR | Status: DC | PRN
  Administered 2021-03-04: 60 mg via INTRAVENOUS

## 2021-03-04 MED ORDER — LIDOCAINE-EPINEPHRINE (PF) 1 %-1:200000 IJ SOLN
INTRAMUSCULAR | Status: DC | PRN
  Administered 2021-03-04: 30 mL

## 2021-03-04 MED ORDER — SODIUM CHLORIDE 0.9 % IV SOLN: INTRAVENOUS | Status: DC

## 2021-03-04 MED ORDER — CHLORHEXIDINE GLUCONATE 0.12 % MT SOLN
OROMUCOSAL | Status: AC
  Administered 2021-03-04: 15 mL via OROMUCOSAL
  Filled 2021-03-04: qty 15

## 2021-03-04 MED ORDER — PROPOFOL 10 MG/ML IV BOLUS
INTRAVENOUS | Status: AC
  Filled 2021-03-04: qty 40

## 2021-03-04 MED ORDER — AMLODIPINE BESYLATE 10 MG PO TABS
10.0000 mg | ORAL_TABLET | Freq: Every day | ORAL | Status: DC
  Administered 2021-03-04 – 2021-03-12 (×9): 10 mg via ORAL
  Filled 2021-03-04 (×9): qty 1

## 2021-03-04 MED ORDER — ORAL CARE MOUTH RINSE: 15.0000 mL | Freq: Once | OROMUCOSAL | Status: AC

## 2021-03-04 MED ORDER — 0.9 % SODIUM CHLORIDE (POUR BTL) OPTIME
TOPICAL | Status: DC | PRN
  Administered 2021-03-04: 1000 mL

## 2021-03-04 MED ORDER — MIDAZOLAM HCL 2 MG/2ML IJ SOLN
INTRAMUSCULAR | Status: AC
  Administered 2021-03-04: 2 mg via INTRAVENOUS
  Filled 2021-03-04: qty 2

## 2021-03-04 MED ORDER — HEPARIN 6000 UNIT IRRIGATION SOLUTION
Status: DC | PRN
  Administered 2021-03-04: 1

## 2021-03-04 MED ORDER — ONDANSETRON HCL 4 MG/2ML IJ SOLN: 4.0000 mg | Freq: Once | INTRAMUSCULAR | Status: DC | PRN

## 2021-03-04 MED ORDER — CARVEDILOL 12.5 MG PO TABS
12.5000 mg | ORAL_TABLET | Freq: Two times a day (BID) | ORAL | Status: DC
  Administered 2021-03-04 – 2021-03-05 (×3): 12.5 mg via ORAL
  Filled 2021-03-04 (×3): qty 1

## 2021-03-04 MED ORDER — LIDOCAINE-EPINEPHRINE (PF) 1.5 %-1:200000 IJ SOLN
INTRAMUSCULAR | Status: DC | PRN
  Administered 2021-03-04: 25 mL via PERINEURAL

## 2021-03-04 MED ORDER — HYDRALAZINE HCL 20 MG/ML IJ SOLN
10.0000 mg | INTRAMUSCULAR | Status: DC | PRN
  Administered 2021-03-04 – 2021-03-12 (×12): 10 mg via INTRAVENOUS
  Filled 2021-03-04 (×12): qty 1

## 2021-03-04 MED ORDER — DEXMEDETOMIDINE (PRECEDEX) IN NS 20 MCG/5ML (4 MCG/ML) IV SYRINGE
PREFILLED_SYRINGE | INTRAVENOUS | Status: AC
  Filled 2021-03-04: qty 5

## 2021-03-04 MED ORDER — CHLORHEXIDINE GLUCONATE 0.12 % MT SOLN: 15.0000 mL | Freq: Once | OROMUCOSAL | Status: AC

## 2021-03-04 MED ORDER — LIDOCAINE HCL (PF) 1 % IJ SOLN
INTRAMUSCULAR | Status: AC
  Filled 2021-03-04: qty 30

## 2021-03-04 MED ORDER — PROPOFOL 500 MG/50ML IV EMUL
INTRAVENOUS | Status: DC | PRN
  Administered 2021-03-04: 75 ug/kg/min via INTRAVENOUS

## 2021-03-04 MED ORDER — FENTANYL CITRATE (PF) 100 MCG/2ML IJ SOLN
100.0000 ug | Freq: Once | INTRAMUSCULAR | Status: AC
  Administered 2021-03-04: 100 ug via INTRAVENOUS

## 2021-03-04 MED ORDER — MIDAZOLAM HCL 2 MG/2ML IJ SOLN: 2.0000 mg | Freq: Once | INTRAMUSCULAR | Status: AC

## 2021-03-04 MED ORDER — OXYCODONE HCL 5 MG PO TABS: 5.0000 mg | ORAL_TABLET | Freq: Once | ORAL | Status: DC | PRN

## 2021-03-04 MED ORDER — HEPARIN 6000 UNIT IRRIGATION SOLUTION
Status: AC
  Filled 2021-03-04: qty 500

## 2021-03-04 MED ORDER — HEPARIN SODIUM (PORCINE) 1000 UNIT/ML IJ SOLN
INTRAMUSCULAR | Status: AC
  Filled 2021-03-04: qty 4

## 2021-03-04 MED ORDER — DEXMEDETOMIDINE HCL 200 MCG/2ML IV SOLN
INTRAVENOUS | Status: DC | PRN
  Administered 2021-03-04: 12 ug via INTRAVENOUS
  Administered 2021-03-04: 8 ug via INTRAVENOUS

## 2021-03-04 MED ORDER — ACETAMINOPHEN 10 MG/ML IV SOLN: 1000.0000 mg | Freq: Once | INTRAVENOUS | Status: DC | PRN

## 2021-03-04 MED ORDER — OXYCODONE HCL 5 MG/5ML PO SOLN: 5.0000 mg | Freq: Once | ORAL | Status: DC | PRN

## 2021-03-04 SURGICAL SUPPLY — 34 items
ARMBAND PINK RESTRICT EXTREMIT (MISCELLANEOUS) ×2 IMPLANT
CANISTER SUCT 3000ML PPV (MISCELLANEOUS) ×2 IMPLANT
CANNULA VESSEL 3MM 2 BLNT TIP (CANNULA) ×2 IMPLANT
CLIP LIGATING EXTRA MED SLVR (CLIP) ×1 IMPLANT
CLIP LIGATING EXTRA SM BLUE (MISCELLANEOUS) ×1 IMPLANT
CLIP VESOCCLUDE MED 6/CT (CLIP) ×2 IMPLANT
CLIP VESOCCLUDE SM WIDE 6/CT (CLIP) ×2 IMPLANT
COVER PROBE W GEL 5X96 (DRAPES) IMPLANT
DERMABOND ADVANCED (GAUZE/BANDAGES/DRESSINGS) ×1
DERMABOND ADVANCED .7 DNX12 (GAUZE/BANDAGES/DRESSINGS) IMPLANT
ELECT REM PT RETURN 9FT ADLT (ELECTROSURGICAL) ×2
ELECTRODE REM PT RTRN 9FT ADLT (ELECTROSURGICAL) ×1 IMPLANT
GAUZE 4X4 16PLY ~~LOC~~+RFID DBL (SPONGE) ×1 IMPLANT
GLOVE SURG NEOP MICRO LF SZ6.5 (GLOVE) ×1 IMPLANT
GLOVE SURG UNDER POLY LF SZ6.5 (GLOVE) ×6 IMPLANT
GOWN STRL REUS W/ TWL LRG LVL3 (GOWN DISPOSABLE) ×2 IMPLANT
GOWN STRL REUS W/TWL LRG LVL3 (GOWN DISPOSABLE) ×5
INSERT FOGARTY SM (MISCELLANEOUS) IMPLANT
KIT BASIN OR (CUSTOM PROCEDURE TRAY) ×2 IMPLANT
KIT TURNOVER KIT B (KITS) ×2 IMPLANT
NDL 18GX1X1/2 (RX/OR ONLY) (NEEDLE) IMPLANT
NEEDLE 18GX1X1/2 (RX/OR ONLY) (NEEDLE) IMPLANT
NS IRRIG 1000ML POUR BTL (IV SOLUTION) ×2 IMPLANT
PACK CV ACCESS (CUSTOM PROCEDURE TRAY) ×2 IMPLANT
PAD ARMBOARD 7.5X6 YLW CONV (MISCELLANEOUS) ×4 IMPLANT
SPONGE T-LAP 18X18 ~~LOC~~+RFID (SPONGE) ×1 IMPLANT
SUT PROLENE 6 0 CC (SUTURE) ×2 IMPLANT
SUT SILK 2 0 PERMA HAND 18 BK (SUTURE) ×3 IMPLANT
SUT VIC AB 3-0 SH 27 (SUTURE) ×3
SUT VIC AB 3-0 SH 27X BRD (SUTURE) ×1 IMPLANT
SYR 3ML LL SCALE MARK (SYRINGE) IMPLANT
TOWEL GREEN STERILE (TOWEL DISPOSABLE) ×2 IMPLANT
UNDERPAD 30X36 HEAVY ABSORB (UNDERPADS AND DIAPERS) ×2 IMPLANT
WATER STERILE IRR 1000ML POUR (IV SOLUTION) ×2 IMPLANT

## 2021-03-04 NOTE — Progress Notes (Signed)
PROGRESS NOTE        PATIENT DETAILS Name: Bobby [name] Age: 25 y.o. Sex: male Date of Birth: [birth year] Admit Date: 02/27/2021 Admitting Physician Evalee Mutton Kristeen Mans, MD PCP:Pcp, No  Brief Narrative: Patient is a 25 y.o. male with PMHx DM-1, HTN, CKD stage IIIb-presented with nausea/vomiting/weakness/decreased appetite-found to have progressive CKD with worsening renal failure.  Significant events: 6/26>> admit-nausea/vomiting/weakness-progressive CKD with worsening renal failure. 6/27>>started on HD  Significant studies: 6/26>> CT abdomen/pelvis: No acute intra-abdominal process-no hydronephrosis. 6/27>> A1c: 8.2 6/27>> HIV: Nonreactive 6/27>> ANA: Positive 6/27>> dsDNA:negative 6/27>> SSA antibody, SSB antibody: Negative 6/27>> anticentromere, anti-Jo-1: Negative 6/27>> c-ANCA/p-ANCA titers: Negative 6/27>> hepatitis C antibody: Nonreactive 6/27>> HBsAg: Nonreactive 6/27>> glomerular basement membrane antibody: Pending 6/27>> SPEP: Pending 6/28>> UA:> 300 protein,+RBC  Antimicrobial therapy: None  Microbiology data: 6/26>> COVID PCR: Negative  Procedures : 6/27>> TDC placed by IR.  Consults: Nephrology, IR.  DVT Prophylaxis : Place and maintain sequential compression device Start: 03/03/21 0910   Subjective: Patient in bed, appears comfortable, denies any headache, no fever, no chest pain or pressure, no shortness of breath , no abdominal pain. No new focal weakness.   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  Vein mapping has been done, going for fistula placement surgery on 03/04/2021, nephrology following appreciate their input.    Hypertensive urgency: BP remains uncontrolled-have adjusted blood pressure medications further on 03/04/2021, continue to  monitor.  Nausea/vomiting: Due to uremia, negative CT and exam.  Seems to have resolved after HD.  Normocytic anemia: Due to CKD-on Aranesp-watch closely and transfuse if significant drop in hemoglobin.  Nontraumatic rhabdomyolysis: CK downtrending-likely clinically insignificant.  Mildly prolonged QTC: Resolved   DM-1: CBG stable with SSI-follow and adjust.  Recent Labs    03/03/21 1603 03/03/21 2023 03/04/21 0738  GLUCAP 208* 175* 135*     Obesity: Estimated body mass index is 34.77 kg/m as calculated from the following:   Height as of this encounter: '6\' 5"'$  (1.956 m).   Weight as of this encounter: 133 kg.   Diet: Diet Order             Diet NPO time specified Except for: Sips with Meds  Diet effective midnight                    Code Status: Full code   Family Communication: Sister at bedside on 6/28  Disposition Plan: Status is: Inpatient  The patient will require care spanning > 2 midnights and should be moved to inpatient because: Inpatient level of care appropriate due to severity of illness  Dispo: The patient is from: Home              Anticipated d/c is to: Home              Patient currently is not medically stable to d/c.   Difficult to place patient No   Barriers to Discharge: AKI-on HD-watching for signs of renal recovery-still with uremic symptoms-not stable for discharge.  Antimicrobial agents: Anti-infectives (From admission, onward)    Start     Dose/Rate Route Frequency Ordered Stop   03/04/21 0600  cefUROXime (ZINACEF) 1.5 g in sodium chloride 0.9 % 100 mL  IVPB        1.5 g 200 mL/hr over 30 Minutes Intravenous On call to O.R. 03/03/21 1018 03/05/21 0559   02/28/21 1030  ceFAZolin (ANCEF) IVPB 2g/100 mL premix        2 g 200 mL/hr over 30 Minutes Intravenous To Radiology 02/28/21 0943 02/28/21 1547        Time spent: 35 minutes-Greater than 50% of this time was spent in counseling, explanation of diagnosis, planning of  further management, and coordination of care.  MEDICATIONS: Scheduled Meds:  amLODipine  10 mg Oral Daily   calcium acetate  1,334 mg Oral TID WC   carvedilol  12.5 mg Oral BID WC   Chlorhexidine Gluconate Cloth  6 each Topical Q0600   Chlorhexidine Gluconate Cloth  6 each Topical Q0600   Chlorhexidine Gluconate Cloth  6 each Topical Q0600   Chlorhexidine Gluconate Cloth  6 each Topical Q0600   cloNIDine  0.2 mg Oral TID   darbepoetin (ARANESP) injection - DIALYSIS  60 mcg Intravenous Q Tue-HD   feeding supplement (NEPRO CARB STEADY)  237 mL Oral BID BM   insulin aspart  0-6 Units Subcutaneous TID WC   isosorbide mononitrate  30 mg Oral Daily   kidney failure book   Does not apply Once   multivitamin  1 tablet Oral QHS   Thrombi-Pad  4 each Topical Once   Continuous Infusions:  cefUROXime (ZINACEF)  IV      PRN Meds:.acetaminophen **OR** acetaminophen, fentaNYL (SUBLIMAZE) injection, hydrALAZINE, labetalol, oxyCODONE, polyvinyl alcohol   PHYSICAL EXAM: Vital signs: Vitals:   03/03/21 1940 03/03/21 2346 03/04/21 0411 03/04/21 0800  BP: (!) 155/83 (!) 159/83 (!) 187/104 (!) 176/109  Pulse: 84 79 75 82  Resp: '17 18 17 16  '$ Temp: 98.4 F (36.9 C) 98 F (36.7 C) 98.2 F (36.8 C)   TempSrc: Oral Oral Oral   SpO2: 98% 97% 100% 99%  Weight:      Height:       Filed Weights   03/01/21 1321 03/02/21 1120 03/02/21 1430  Weight: 125.6 kg 135 kg 133 kg   Body mass index is 34.77 kg/m.   Gen Exam:  Awake Alert, No new F.N deficits,  R - IJ HD Cath [place].AT,PERRAL Supple Neck,No JVD, No cervical lymphadenopathy appriciated.  Symmetrical Chest wall movement, Good air movement bilaterally, CTAB RRR,No Gallops, Rubs or new Murmurs, No Parasternal Heave +ve B.Sounds, Abd Soft, No tenderness, No organomegaly appriciated, No rebound - guarding or rigidity. No Cyanosis, Clubbing or edema, No new Rash or bruise    I have personally reviewed following labs and imaging  studies  LABORATORY DATA: CBC: Recent Labs  Lab 02/27/21 1825 02/28/21 0630 03/01/21 0332 03/02/21 0111 03/02/21 1142 03/03/21 0306  WBC 6.8 6.3 5.6 6.2 7.2 5.4  NEUTROABS 4.5 4.2  --   --   --   --   HGB 9.6* 8.4* 9.0* 9.0* 7.9* 8.5*  HCT 28.8* 25.7* 26.9* 27.1* 24.6* 26.1*  MCV 80.9 82.4 82.0 82.4 83.4 83.1  PLT 333 291 279 221 205 [id number]    Basic Metabolic Panel: Recent Labs  Lab 02/28/21 0630 03/01/21 0332 03/02/21 0111 03/03/21 0306 03/04/21 0113  NA 141 138 135 133* 135  K 4.1 3.9 4.2 4.4 5.3*  CL 105 100 100 96* 100  CO2 18* '22 24 25 24  '$ GLUCOSE 118* 133* 134* 152* 185*  BUN 124* 92* 67* 42* 59*  CREATININE 25.91* 21.02* 16.95* 12.61* 14.31*  CALCIUM 7.0*  7.2* 7.6* 8.4* 8.1*  MG  --   --  1.7  --   --   PHOS  --  7.1* 6.3* 6.3* 6.7*    GFR: Estimated Creatinine Clearance: 11.9 mL/min (A) (by C-G formula based on SCr of 14.31 mg/dL (H)).  Liver Function Tests: Recent Labs  Lab 02/27/21 1825 02/28/21 0630 03/01/21 0332 03/02/21 0111 03/03/21 0306 03/04/21 0113  AST 16 14*  --   --   --   --   ALT 15 15  --   --   --   --   ALKPHOS 103 93  --   --   --   --   BILITOT 0.5 0.7  --   --   --   --   PROT 7.0 6.0*  --   --   --   --   ALBUMIN 3.1* 2.7* 2.5* 2.5* 2.5* 2.3*   Recent Labs  Lab 02/27/21 1825  LIPASE 64*   No results for input(s): AMMONIA in the last 168 hours.  Coagulation Profile: No results for input(s): INR, PROTIME in the last 168 hours.  Cardiac Enzymes: Recent Labs  Lab 02/28/21 0005 02/28/21 0630 03/01/21 0332  CKTOTAL 1,116* 969* 774*    BNP (last 3 results) No results for input(s): PROBNP in the last 8760 hours.  Lipid Profile: No results for input(s): CHOL, HDL, LDLCALC, TRIG, CHOLHDL, LDLDIRECT in the last 72 hours.  Thyroid Function Tests: No results for input(s): TSH, T4TOTAL, FREET4, T3FREE, THYROIDAB in the last 72 hours.  Anemia Panel: No results for input(s): VITAMINB12, FOLATE, FERRITIN, TIBC, IRON,  RETICCTPCT in the last 72 hours.  Urine analysis:    Component Value Date/Time   COLORURINE YELLOW 03/01/2021 [place] 03/01/2021 0847   LABSPEC 1.010 03/01/2021 0847   PHURINE 7.0 03/01/2021 0847   GLUCOSEU 150 (A) 03/01/2021 0847   HGBUR SMALL (A) 03/01/2021 0847   BILIRUBINUR NEGATIVE 03/01/2021 0847   KETONESUR 5 (A) 03/01/2021 0847   PROTEINUR >=300 (A) 03/01/2021 0847   NITRITE NEGATIVE 03/01/2021 0847   LEUKOCYTESUR NEGATIVE 03/01/2021 0847    Sepsis Labs: Lactic Acid, Venous No results found for: LATICACIDVEN  MICROBIOLOGY: Recent Results (from the past 240 hour(s))  SARS CORONAVIRUS 2 (TAT 6-24 HRS) Nasopharyngeal Nasopharyngeal Swab     Status: None   Collection Time: 02/27/21  7:42 PM   Specimen: Nasopharyngeal Swab  Result Value Ref Range Status   SARS Coronavirus 2 NEGATIVE NEGATIVE Final    Comment: (NOTE) SARS-CoV-2 target nucleic acids are NOT DETECTED.  The SARS-CoV-2 RNA is generally detectable in upper and lower respiratory specimens during the acute phase of infection. Negative results do not preclude SARS-CoV-2 infection, do not rule out co-infections with other pathogens, and should not be used as the sole basis for treatment or other patient management decisions. Negative results must be combined with clinical observations, patient history, and epidemiological information. The expected result is Negative.  Fact Sheet for Patients: SugarRoll.be  Fact Sheet for Healthcare Providers: https://www.woods-mathews.com/  This test is not yet approved or cleared by the [place] FDA and  has been authorized for detection and/or diagnosis of SARS-CoV-2 by FDA under an Emergency Use Authorization (EUA). This EUA will remain  in effect (meaning this test can be used) for the duration of the COVID-19 declaration under Se ction 564(b)(1) of the Act, 21 U.S.C. section 360bbb-3(b)(1), unless the  authorization is terminated or revoked sooner.  Performed at [facility],  [address] [place], [place] [address]     RADIOLOGY STUDIES/RESULTS: No results found.   LOS: 4 days   Signature  Lala Lund M.D on 03/04/2021 at 8:53 AM   -  To page go to [email or web address]

## 2021-03-04 NOTE — Op Note (Signed)
    OPERATIVE REPORT  DATE OF SURGERY: 03/04/2021  PATIENT: Bobby [name], 25 y.o. male MRN: [id number]  DOB: [birth year]  PRE-OPERATIVE DIAGNOSIS: End-stage renal disease  POST-OPERATIVE DIAGNOSIS:  Same  PROCEDURE: Left basilic vein transposition fistula  SURGEON:  Curt Jews, M.D.  PHYSICIAN ASSISTANT: Rhyne  The assistant was needed for exposure and to expedite the case  ANESTHESIA: Axillary block and sedation  EBL: per anesthesia record  Total I/O In: 450 [I.V.:350; IV Piggyback:100] Out: 10 [Blood:10]  BLOOD ADMINISTERED: none  DRAINS: none  SPECIMEN: none  COUNTS CORRECT:  YES  PATIENT DISPOSITION:  PACU - hemodynamically stable  PROCEDURE DETAILS: The patient was taken operating placed supine position where the area of the left arm prepped draped you sterile fashion.  SonoSite ultrasound was used to visualize the veins.  Patient had a very small cephalic vein but had a large caliber basilic vein beginning at the antecubital space.  Incision was made over this area and the basilic vein was a very large caliber.  The brachial artery was exposed with the same incision.  The artery was somewhat deeper than normal but was of normal size.  Decision was made to proceed with single stage basilic vein transposition fistula due to the large caliber of the basilic vein.  Separate incision was made in the mid upper arm and then at the axilla.  The basilic vein was exposed from the antecubital space to the axilla and tributary branches were ligated with 3-0 and 2-0 silk ties and divided.  The vein was ligated distally and divided and was brought through the subcutaneous tunnel.  The vein had been marked to reduce risk of twisting.  A tunnel was created from the level of the antecubital space to the axillary vein and the vein was brought back through the subcutaneous tunnel.  The brachial artery was occluded proximally distally was opened with 11 blade and sent longstanding Pott  scissors.  The vein was cut to the appropriate span length and was spatulated and sewn end-to-side to the artery with a running 6-0 Prolene suture.  Clamps were removed and excellent thrill was noted.  Wounds irrigated with saline.  Hemostased electrocautery.  Wounds were closed with 3-0 Vicryl in the subcutaneous and subcuticular tissue.  Sterile dressing was applied and the patient was transferred to the recovery room in stable condition   Bobby [name], M.D., [facility] 03/04/2021 1:52 PM  Note: Portions of this report may have been transcribed using voice recognition software.  Every effort has been made to ensure accuracy; however, inadvertent computerized transcription errors may still be present.

## 2021-03-04 NOTE — Progress Notes (Signed)
[facility] NEPHROLOGY PROGRESS NOTE  Assessment/ Plan: Pt is a 25 y.o. yo male  with history of type 1 diabetes since very young age, retinopathy, uncontrolled hypertension on metoprolol, CKD 3 and was followed by nephrologist in [facility] with creatinine level 2.10 in 09/2019, presented with severe nausea, vomiting, decreased appetite and weakness for about 6 weeks, seen as a consultation for the management of advanced renal failure. SBP >200 and creatinine level around 26.  #Advanced renal failure most likely progressive CKD in the setting of uncontrolled diabetes and hypertension to new ESRD.  The creatinine level was around 2 back in 2021 and was seen by nephrologist in [facility].  He has uncontrolled diabetes and hypertension with poor follow-up.  Now he presents with around 6 weeks of uremic symptoms with creatinine level peaked at 26.  The CT scan ruled out hydronephrosis/obstruction. Urinalysis with proteinuria UPC 3.7 and microscopic hematuria.  Hep B, hep C, HIV, ANCA, SPEP negative.  ANA positive with unremarkable complements and antidsDNA negative.  Follow-up anti-GBM. The proteinuria due to uncontrolled diabetes. I am not sure how much benefit he will get with kidney biopsy at this time, he probably has a lot of chronic finding/scarring.  Patient is hesitant to do biopsy as well. He received 3 dialysis and tolerated well.  Plan for next treatment today.  Vein mapping was done and plan for AV fistula placement today by vascular surgeon. Social worker already following to arrange outpatient dialysis.  #Hypertensive urgency with renal failure: Blood pressure is still elevated but much better.  The antihypertensive has been titrated to clonidine 0.2 mg 3 times daily, amlodipine, Imdur, carvedilol.  Continue ultrafiltration during dialysis.      #Metabolic acidosis with CKD: Now managed with dialysis.  DC IV sodium bicarbonate.   #Anemia of chronic kidney disease: Iron  saturation 39% with serum iron 92. Started IV iron and ESA.   #Secondary hyperparathyroidism: PTH 259 at goal for CKD.  Started PhosLo for hyperphosphatemia.  Subjective: Seen and examined at bedside.  Seen by vascular surgeon and he is now n.p.o. for AV fistula placement today.  Denies nausea vomiting chest pain shortness of breath.  The HD catheter site has no bleeding.   Objective Vital signs in last 24 hours: Vitals:   03/03/21 1940 03/03/21 2346 03/04/21 0411 03/04/21 0800  BP: (!) 155/83 (!) 159/83 (!) 187/104 (!) 176/109  Pulse: 84 79 75 82  Resp: '17 18 17 16  '$ Temp: 98.4 F (36.9 C) 98 F (36.7 C) 98.2 F (36.8 C)   TempSrc: Oral Oral Oral   SpO2: 98% 97% 100% 99%  Weight:      Height:       Weight change:  No intake or output data in the 24 hours ending 03/04/21 0836      Labs: Basic Metabolic Panel: Recent Labs  Lab 03/02/21 0111 03/03/21 0306 03/04/21 0113  NA 135 133* 135  K 4.2 4.4 5.3*  CL 100 96* 100  CO2 '24 25 24  '$ GLUCOSE 134* 152* 185*  BUN 67* 42* 59*  CREATININE 16.95* 12.61* 14.31*  CALCIUM 7.6* 8.4* 8.1*  PHOS 6.3* 6.3* 6.7*    Liver Function Tests: Recent Labs  Lab 02/27/21 1825 02/28/21 0630 03/01/21 0332 03/02/21 0111 03/03/21 0306 03/04/21 0113  AST 16 14*  --   --   --   --   ALT 15 15  --   --   --   --   ALKPHOS 103  93  --   --   --   --   BILITOT 0.5 0.7  --   --   --   --   PROT 7.0 6.0*  --   --   --   --   ALBUMIN 3.1* 2.7*   < > 2.5* 2.5* 2.3*   < > = values in this interval not displayed.    Recent Labs  Lab 02/27/21 1825  LIPASE 64*    No results for input(s): AMMONIA in the last 168 hours. CBC: Recent Labs  Lab 02/27/21 1825 02/28/21 0630 03/01/21 0332 03/02/21 0111 03/02/21 1142 03/03/21 0306  WBC 6.8 6.3 5.6 6.2 7.2 5.4  NEUTROABS 4.5 4.2  --   --   --   --   HGB 9.6* 8.4* 9.0* 9.0* 7.9* 8.5*  HCT 28.8* 25.7* 26.9* 27.1* 24.6* 26.1*  MCV 80.9 82.4 82.0 82.4 83.4 83.1  PLT 333 291 279 221 205 172     Cardiac Enzymes: Recent Labs  Lab 02/28/21 0005 02/28/21 0630 03/01/21 0332  CKTOTAL 1,116* 969* 774*    CBG: Recent Labs  Lab 03/03/21 0829 03/03/21 1159 03/03/21 1603 03/03/21 2023 03/04/21 0738  GLUCAP 144* 130* 208* 175* 135*     Iron Studies:  No results for input(s): IRON, TIBC, TRANSFERRIN, FERRITIN in the last 72 hours.  Studies/Results: No results found.  Medications: Infusions:  cefUROXime (ZINACEF)  IV       Scheduled Medications:  amLODipine  10 mg Oral Daily   calcium acetate  1,334 mg Oral TID WC   carvedilol  12.5 mg Oral BID WC   Chlorhexidine Gluconate Cloth  6 each Topical Q0600   Chlorhexidine Gluconate Cloth  6 each Topical Q0600   Chlorhexidine Gluconate Cloth  6 each Topical Q0600   Chlorhexidine Gluconate Cloth  6 each Topical Q0600   cloNIDine  0.2 mg Oral TID   darbepoetin (ARANESP) injection - DIALYSIS  60 mcg Intravenous Q Tue-HD   feeding supplement (NEPRO CARB STEADY)  237 mL Oral BID BM   insulin aspart  0-6 Units Subcutaneous TID WC   isosorbide mononitrate  30 mg Oral Daily   kidney failure book   Does not apply Once   multivitamin  1 tablet Oral QHS   Thrombi-Pad  4 each Topical Once    have reviewed scheduled and prn medications.  Physical Exam: General:NAD, comfortable Heart:RRR, s1s2 nl Lungs: Clear b/l, no crackle Abdomen:soft, Non-tender, non-distended Extremities: No LE edema Dialysis Access: Right IJ TDC, no bleeding.  Mahkai Fangman Prasad Jadae Steinke 03/04/2021,8:36 AM  LOS: 4 days

## 2021-03-04 NOTE — Anesthesia Postprocedure Evaluation (Signed)
Anesthesia Post Note  Patient: Jaaziah Schulke  Procedure(s) Performed: Basilic Vein Transposition Left upper arm fistula (Left: Arm Upper)     Patient location during evaluation: PACU Anesthesia Type: MAC and Regional Level of consciousness: awake and alert Pain management: pain level controlled Vital Signs Assessment: post-procedure vital signs reviewed and stable Respiratory status: spontaneous breathing, nonlabored ventilation and respiratory function stable Cardiovascular status: blood pressure returned to baseline and stable Postop Assessment: no apparent nausea or vomiting Anesthetic complications: no   No notable events documented.  Last Vitals:  Vitals:   03/04/21 1417 03/04/21 1430  BP: (!) 151/84 (!) 160/94  Pulse: 87 83  Resp: 19 11  Temp:  (!) 36.4 C  SpO2: 100% 99%    Last Pain:  Vitals:   03/04/21 1430  TempSrc:   PainSc: 0-No pain                 Pervis Hocking

## 2021-03-04 NOTE — Transfer of Care (Signed)
Immediate Anesthesia Transfer of Care Note  Patient: Bobby [name]  Procedure(s) Performed: Basilic Vein Transposition Left upper arm fistula (Left: Arm Upper)  Patient Location: PACU  Anesthesia Type:MAC and Regional  Level of Consciousness: awake  Airway & Oxygen Therapy: Patient Spontanous Breathing and Patient connected to nasal cannula oxygen  Post-op Assessment: Report given to RN and Post -op Vital signs reviewed and stable  Post vital signs: Reviewed and stable  Last Vitals:  Vitals Value Taken Time  BP 144/82 03/04/21 1402  Temp    Pulse 82 03/04/21 1404  Resp 32 03/04/21 1404  SpO2 93 % 03/04/21 1404  Vitals shown include unvalidated device data.  Last Pain:  Vitals:   03/04/21 1022  TempSrc: Oral  PainSc: 0-No pain      Patients Stated Pain Goal: 0 (40/45/91 [id number])  Complications: No notable events documented.

## 2021-03-04 NOTE — Discharge Instructions (Signed)
   [facility]  Discharge Instructions  AV Fistula or Graft Surgery for Dialysis Access  Please refer to the following instructions for your post-procedure care. Your surgeon or physician assistant will discuss any changes with you.  Activity  You may drive the day following your surgery, if you are comfortable and no longer taking prescription pain medication. Resume full activity as the soreness in your incision resolves.  Bathing/Showering  You may shower after you go home. Keep your incision dry for 48 hours. Do not soak in a bathtub, hot tub, or swim until the incision heals completely. You may not shower if you have a hemodialysis catheter.  Incision Care  Clean your incision with mild soap and water after 48 hours. Pat the area dry with a clean towel. You do not need a bandage unless otherwise instructed. Do not apply any ointments or creams to your incision. You may have skin glue on your incision. Do not peel it off. It will come off on its own in about one week. Your arm may swell a bit after surgery. To reduce swelling use pillows to elevate your arm so it is above your heart. Your doctor will tell you if you need to lightly wrap your arm with an ACE bandage.  Diet  Resume your normal diet. There are not special food restrictions following this procedure. In order to heal from your surgery, it is CRITICAL to get adequate nutrition. Your body requires vitamins, minerals, and protein. Vegetables are the best source of vitamins and minerals. Vegetables also provide the perfect balance of protein. Processed food has little nutritional value, so try to avoid this.  Medications  Resume taking all of your medications. If your incision is causing pain, you may take over-the counter pain relievers such as acetaminophen (Tylenol). If you were prescribed a stronger pain medication, please be aware these medications can cause nausea and constipation. Prevent  nausea by taking the medication with a snack or meal. Avoid constipation by drinking plenty of fluids and eating foods with high amount of fiber, such as fruits, vegetables, and grains.  Do not take Tylenol if you are taking prescription pain medications.  Follow up Your surgeon may want to see you in the office following your access surgery. If so, this will be arranged at the time of your surgery.  Please call us immediately for any of the following conditions:  Increased pain, redness, drainage (pus) from your incision site Fever of 101 degrees or higher Severe or worsening pain at your incision site Hand pain or numbness.  Reduce your risk of vascular disease:  Stop smoking. If you would like help, call [facility] at 1-800-QUIT-NOW 973-022-8811) or [place] at [place] your cholesterol Maintain a desired weight Control your diabetes Keep your blood pressure down  Dialysis  It will take several weeks to several months for your new dialysis access to be ready for use. Your surgeon will determine when it is okay to use it. Your nephrologist will continue to direct your dialysis. You can continue to use your Permcath until your new access is ready for use.   03/04/2021 Bobby [name] [id number] 01/25/1996  Surgeon(s): Early, Arvilla Meres, MD  Procedure(s): Single Stage Basilic Vein Transposition Left upper arm fistula  x Do not stick fistula for 12 weeks    If you have any questions, please call the office at [phone].

## 2021-03-04 NOTE — Anesthesia Preprocedure Evaluation (Addendum)
Anesthesia Evaluation  Patient identified by MRN, date of birth, ID band Patient awake    Reviewed: Allergy & Precautions, NPO status , Patient's Chart, lab work & pertinent test results, reviewed documented beta blocker date and time   Airway Mallampati: III  TM Distance: >3 FB Neck ROM: Full    Dental  (+) Dental Advisory Given   Pulmonary neg pulmonary ROS,    Pulmonary exam normal breath sounds clear to auscultation       Cardiovascular hypertension, Pt. on home beta blockers and Pt. on medications Normal cardiovascular exam Rhythm:Regular Rate:Normal     Neuro/Psych negative neurological ROS  negative psych ROS   GI/Hepatic negative GI ROS, (+)     substance abuse  marijuana use,   Endo/Other  diabetes, Poorly Controlled, Type 1, Insulin DependentObesity BMI 35 Type 1 diabetic- last a1c 8.2  Renal/GU DialysisRenal diseaseK 5.3 this AM  negative genitourinary   Musculoskeletal negative musculoskeletal ROS (+)   Abdominal (+) + obese,   Peds negative pediatric ROS (+)  Hematology  (+) Blood dyscrasia, anemia , Hb 8.5   Anesthesia Other Findings   Reproductive/Obstetrics negative OB ROS                           Anesthesia Physical Anesthesia Plan  ASA: 4  Anesthesia Plan: MAC and Regional   Post-op Pain Management:  Regional for Post-op pain   Induction: Intravenous  PONV Risk Score and Plan: 1 and Propofol infusion, Treatment may vary due to age or medical condition, TIVA and Midazolam  Airway Management Planned: Simple Face Mask  Additional Equipment: None  Intra-op Plan:   Post-operative Plan:   Informed Consent: I have reviewed the patients History and Physical, chart, labs and discussed the procedure including the risks, benefits and alternatives for the proposed anesthesia with the patient or authorized representative who has indicated his/her understanding and  acceptance.     Dental advisory given  Plan Discussed with: CRNA and Surgeon  Anesthesia Plan Comments:       Anesthesia Quick Evaluation

## 2021-03-04 NOTE — Progress Notes (Signed)
Patient's personal belongings brought to floor RN by Ginger, short stay RN. Items included cell phone, pants, underwear, and pillow. Patient made aware he could not take these items into the OR; understanding verbalized.

## 2021-03-04 NOTE — Progress Notes (Signed)
Patient's sister did not bring his Medicaid card to the hospital as we discussed yesterday, however, per conversation with [facility] CSW, he has "Family Planning Medicaid" according to hospital Admitting.  Navigator has gone ahead with patient's outpatient referral to [facility] for patient as uninsured and the [employer] can determine financial clearance when evaluated. We will have to wait on this, which will cause delay, especially given that Monday is a holiday.  Navigator will follow up next week.  Alphonzo Cruise, [place] [facility] Navigator [phone]

## 2021-03-04 NOTE — Anesthesia Procedure Notes (Signed)
Anesthesia Regional Block: Supraclavicular block   Pre-Anesthetic Checklist: , timeout performed,  Correct Patient, Correct Site, Correct Laterality,  Correct Procedure, Correct Position, site marked,  Risks and benefits discussed,  Surgical consent,  Pre-op evaluation,  At surgeon's request and post-op pain management  Laterality: Left  Prep: Maximum Sterile Barrier Precautions used, chloraprep       Needles:  Injection technique: Single-shot  Needle Type: Echogenic Stimulator Needle     Needle Length: 9cm  Needle Gauge: 22     Additional Needles:   Procedures:,,,, ultrasound used (permanent image in chart),,    Narrative:  Start time: 03/04/2021 10:40 AM End time: 03/04/2021 10:50 AM Injection made incrementally with aspirations every 5 mL.  Performed by: Personally  Anesthesiologist: Pervis Hocking, DO  Additional Notes: Monitors applied. No increased pain on injection. No increased resistance to injection. Injection made in 5cc increments. Good needle visualization. Patient tolerated procedure well.   Also performed L intercostobrachial nerve block w/o ultrasound

## 2021-03-04 NOTE — Interval H&P Note (Signed)
History and Physical Interval Note:  03/04/2021 10:37 AM  Bobby [name]  has presented today for surgery, with the diagnosis of CHRONIC KIDNEY DISEASE.  The various methods of treatment have been discussed with the patient and family. After consideration of risks, benefits and other options for treatment, the patient has consented to  Procedure(s) with comments: ARTERIOVENOUS (AV) FISTULA VERSUS GRAFT CREATION (N/A) - PERIPHERAL NERVE BLOCK as a surgical intervention.  The patient's history has been reviewed, patient examined, no change in status, stable for surgery.  I have reviewed the patient's chart and labs.  Questions were answered to the patient's satisfaction.     Curt Jews

## 2021-03-04 NOTE — Anesthesia Procedure Notes (Signed)
Procedure Name: MAC Date/Time: 03/04/2021 11:19 AM Performed by: Lieutenant Diego, CRNA Pre-anesthesia Checklist: Patient identified, Emergency Drugs available, Suction available, Patient being monitored and Timeout performed Oxygen Delivery Method: Nasal cannula Preoxygenation: Pre-oxygenation with 100% oxygen Induction Type: IV induction

## 2021-03-05 ENCOUNTER — Encounter (HOSPITAL_COMMUNITY): Payer: Self-pay | Admitting: Vascular Surgery

## 2021-03-05 DIAGNOSIS — N1832 Chronic kidney disease, stage 3b: Secondary | ICD-10-CM

## 2021-03-05 LAB — RENAL FUNCTION PANEL
Albumin: 2.3 g/dL — ABNORMAL LOW (ref 3.5–5.0)
Anion gap: 11 (ref 5–15)
BUN: 40 mg/dL — ABNORMAL HIGH (ref 6–20)
CO2: 26 mmol/L (ref 22–32)
Calcium: 8.3 mg/dL — ABNORMAL LOW (ref 8.9–10.3)
Chloride: 96 mmol/L — ABNORMAL LOW (ref 98–111)
Creatinine, Ser: 10.23 mg/dL — ABNORMAL HIGH (ref 0.61–1.24)
GFR, Estimated: 7 mL/min — ABNORMAL LOW (ref >60)
Glucose, Bld: 206 mg/dL — ABNORMAL HIGH (ref 70–99)
Phosphorus: 4.3 mg/dL (ref 2.5–4.6)
Potassium: 4.5 mmol/L (ref 3.5–5.1)
Sodium: 133 mmol/L — ABNORMAL LOW (ref 135–145)

## 2021-03-05 LAB — GLUCOSE, CAPILLARY
Glucose-Capillary: 168 mg/dL — ABNORMAL HIGH (ref 70–99)
Glucose-Capillary: 225 mg/dL — ABNORMAL HIGH (ref 70–99)
Glucose-Capillary: 223 mg/dL — ABNORMAL HIGH (ref 70–99)
Glucose-Capillary: 178 mg/dL — ABNORMAL HIGH (ref 70–99)

## 2021-03-05 MED ORDER — CARVEDILOL 25 MG PO TABS
25.0000 mg | ORAL_TABLET | Freq: Two times a day (BID) | ORAL | Status: DC
  Administered 2021-03-05 – 2021-03-11 (×11): 25 mg via ORAL
  Filled 2021-03-05 (×11): qty 1

## 2021-03-05 MED ORDER — HEPARIN SODIUM (PORCINE) 5000 UNIT/ML IJ SOLN
5000.0000 [IU] | Freq: Three times a day (TID) | INTRAMUSCULAR | Status: DC
  Administered 2021-03-05 – 2021-03-12 (×21): 5000 [IU] via SUBCUTANEOUS
  Filled 2021-03-05 (×21): qty 1

## 2021-03-05 NOTE — Progress Notes (Signed)
PROGRESS NOTE        PATIENT DETAILS Name: Bobby [name] Age: 25 y.o. Sex: male Date of Birth: 1996/07/20 Admit Date: 02/27/2021 Admitting Physician Evalee Mutton Kristeen Mans, MD PCP:Pcp, No  Brief Narrative: Patient is a 25 y.o. male with PMHx DM-1, HTN, CKD stage IIIb-presented with nausea/vomiting/weakness/decreased appetite-found to have progressive CKD with worsening renal failure.  Significant events: 6/26>> admit-nausea/vomiting/weakness-progressive CKD with worsening renal failure. 6/27>>started on HD  Significant studies: 6/26>> CT abdomen/pelvis: No acute intra-abdominal process-no hydronephrosis. 6/27>> A1c: 8.2 6/27>> HIV: Nonreactive 6/27>> ANA: Positive 6/27>> dsDNA:negative 6/27>> SSA antibody, SSB antibody: Negative 6/27>> anticentromere, anti-Jo-1: Negative 6/27>> c-ANCA/p-ANCA titers: Negative 6/27>> hepatitis C antibody: Nonreactive 6/27>> HBsAg: Nonreactive 6/27>> glomerular basement membrane antibody: Pending 6/27>> SPEP: No M spike 6/28>> UA:> 300 protein,+RBC  Antimicrobial therapy: None  Microbiology data: 6/26>> COVID PCR: Negative  Procedures : 6/27>> TDC placed by IR. [id number] left basilic vein transposition fistula by vascular surgery  Consults: Nephrology, IR, vascular surgery  DVT Prophylaxis : Place and maintain sequential compression device Start: 03/03/21 0910   Subjective: Comfortably in bed-no chest pain, shortness of breath, nausea, vomiting or diarrhea.  Assessment/Plan: Progressive renal failure/AKI superimposed on CKD stage IIIb-now likely ESRD: Uremic symptoms have significantly improved-nephrology following and directing HD care.  Will need to await outpatient dialysis arrangement before consideration of discharge.   Hypertensive urgency: BP remains elevated-Coreg increased to 25 mg twice daily, remains on amlodipine, clonidine and Imdur.    Nausea/vomiting: Has resolved.  Likely due to uremia-no  significant abnormality seen on CT abdomen.  Normocytic anemia: Due to CKD-on Aranesp-watch closely and transfuse if significant drop in hemoglobin.  Nontraumatic rhabdomyolysis: CK downtrending-likely clinically insignificant.  Mildly prolonged QTC: Resolved   DM-1: CBG stable with SSI-follow and adjust.  Recent Labs    03/04/21 1800 03/04/21 2249 03/05/21 0839  GLUCAP 216* 134* 168*      Obesity: Estimated body mass index is 34.77 kg/m as calculated from the following:   Height as of this encounter: '6\' 5"'$  (1.956 m).   Weight as of this encounter: 133 kg.   Diet: Diet Order             Diet renal/carb modified with fluid restriction Diet-HS Snack? Nothing; Fluid restriction: 1200 mL Fluid; Room service appropriate? Yes; Fluid consistency: Thin  Diet effective now                    Code Status: Full code   Family Communication: Sister at bedside on 6/28  Disposition Plan: Status is: Inpatient  The patient will require care spanning > 2 midnights and should be moved to inpatient because: Inpatient level of care appropriate due to severity of illness  Dispo: The patient is from: Home              Anticipated d/c is to: Home              Patient currently is not medically stable to d/c.   Difficult to place patient No   Barriers to Discharge: AKI-on HD-watching for signs of renal recovery-still with uremic symptoms-not stable for discharge.  Antimicrobial agents: Anti-infectives (From admission, onward)    Start     Dose/Rate Route Frequency Ordered Stop   03/04/21 0600  cefUROXime (ZINACEF) 1.5 g in sodium chloride 0.9 % 100 mL IVPB  1.5 g 200 mL/hr over 30 Minutes Intravenous On call to O.R. 03/03/21 1018 03/04/21 1145   02/28/21 1030  ceFAZolin (ANCEF) IVPB 2g/100 mL premix        2 g 200 mL/hr over 30 Minutes Intravenous To Radiology 02/28/21 0943 02/28/21 1547        Time spent: 25 minutes-Greater than 50% of this time was spent in  counseling, explanation of diagnosis, planning of further management, and coordination of care.  MEDICATIONS: Scheduled Meds:  amLODipine  10 mg Oral Daily   calcium acetate  1,334 mg Oral TID WC   carvedilol  25 mg Oral BID WC   Chlorhexidine Gluconate Cloth  6 each Topical Q0600   Chlorhexidine Gluconate Cloth  6 each Topical Q0600   Chlorhexidine Gluconate Cloth  6 each Topical Q0600   Chlorhexidine Gluconate Cloth  6 each Topical Q0600   cloNIDine  0.2 mg Oral TID   darbepoetin (ARANESP) injection - DIALYSIS  60 mcg Intravenous Q Tue-HD   feeding supplement (NEPRO CARB STEADY)  237 mL Oral BID BM   insulin aspart  0-6 Units Subcutaneous TID WC   isosorbide mononitrate  30 mg Oral Daily   kidney failure book   Does not apply Once   multivitamin  1 tablet Oral QHS   Thrombi-Pad  4 each Topical Once   Continuous Infusions:   PRN Meds:.acetaminophen **OR** acetaminophen, fentaNYL (SUBLIMAZE) injection, hydrALAZINE, labetalol, oxyCODONE, polyvinyl alcohol   PHYSICAL EXAM: Vital signs: Vitals:   03/04/21 2216 03/05/21 0441 03/05/21 0600 03/05/21 0845  BP: (!) 144/76 (!) 195/87 (!) 149/97 (!) 191/103  Pulse: 88 97  97  Resp: (!) 22   (!) 23  Temp: 98.4 F (36.9 C) (!) 100.5 F (38.1 C) 99.5 F (37.5 C) 98.1 F (36.7 C)  TempSrc: Oral Oral  Oral  SpO2: 98%   96%  Weight:      Height:       Filed Weights   03/01/21 1321 03/02/21 1120 03/02/21 1430  Weight: 125.6 kg 135 kg 133 kg   Body mass index is 34.77 kg/m.   Gen Exam:Alert awake-not in any distress HEENT:atraumatic, normocephalic Chest: B/L clear to auscultation anteriorly CVS:S1S2 regular Abdomen:soft non tender, non distended Extremities:no edema Neurology: Non focal Skin: no rash   I have personally reviewed following labs and imaging studies  LABORATORY DATA: CBC: Recent Labs  Lab 02/27/21 1825 02/28/21 0630 03/01/21 0332 03/02/21 0111 03/02/21 1142 03/03/21 0306  WBC 6.8 6.3 5.6 6.2 7.2 5.4   NEUTROABS 4.5 4.2  --   --   --   --   HGB 9.6* 8.4* 9.0* 9.0* 7.9* 8.5*  HCT 28.8* 25.7* 26.9* 27.1* 24.6* 26.1*  MCV 80.9 82.4 82.0 82.4 83.4 83.1  PLT 333 291 279 221 205 172     Basic Metabolic Panel: Recent Labs  Lab 03/01/21 0332 03/02/21 0111 03/03/21 0306 03/04/21 0113 03/05/21 0226  NA 138 135 133* 135 133*  K 3.9 4.2 4.4 5.3* 4.5  CL 100 100 96* 100 96*  CO2 '22 24 25 24 26  '$ GLUCOSE 133* 134* 152* 185* 206*  BUN 92* 67* 42* 59* 40*  CREATININE 21.02* 16.95* 12.61* 14.31* 10.23*  CALCIUM 7.2* 7.6* 8.4* 8.1* 8.3*  MG  --  1.7  --   --   --   PHOS 7.1* 6.3* 6.3* 6.7* 4.3     GFR: Estimated Creatinine Clearance: 16.7 mL/min (A) (by C-G formula based on SCr of 10.23 mg/dL (H)).  Liver Function Tests: Recent Labs  Lab 02/27/21 1825 02/28/21 0630 03/01/21 0332 03/02/21 0111 03/03/21 0306 03/04/21 0113 03/05/21 0226  AST 16 14*  --   --   --   --   --   ALT 15 15  --   --   --   --   --   ALKPHOS 103 93  --   --   --   --   --   BILITOT 0.5 0.7  --   --   --   --   --   PROT 7.0 6.0*  --   --   --   --   --   ALBUMIN 3.1* 2.7* 2.5* 2.5* 2.5* 2.3* 2.3*    Recent Labs  Lab 02/27/21 1825  LIPASE 64*    No results for input(s): AMMONIA in the last 168 hours.  Coagulation Profile: No results for input(s): INR, PROTIME in the last 168 hours.  Cardiac Enzymes: Recent Labs  Lab 02/28/21 0005 02/28/21 0630 03/01/21 0332  CKTOTAL 1,116* 969* 774*     BNP (last 3 results) No results for input(s): PROBNP in the last 8760 hours.  Lipid Profile: No results for input(s): CHOL, HDL, LDLCALC, TRIG, CHOLHDL, LDLDIRECT in the last 72 hours.   Thyroid Function Tests: No results for input(s): TSH, T4TOTAL, FREET4, T3FREE, THYROIDAB in the last 72 hours.   Anemia Panel: No results for input(s): VITAMINB12, FOLATE, FERRITIN, TIBC, IRON, RETICCTPCT in the last 72 hours.   Urine analysis:    Component Value Date/Time   COLORURINE YELLOW 03/01/2021  [place] 03/01/2021 0847   LABSPEC 1.010 03/01/2021 0847   PHURINE 7.0 03/01/2021 0847   GLUCOSEU 150 (A) 03/01/2021 0847   HGBUR SMALL (A) 03/01/2021 0847   BILIRUBINUR NEGATIVE 03/01/2021 0847   KETONESUR 5 (A) 03/01/2021 0847   PROTEINUR >=300 (A) 03/01/2021 0847   NITRITE NEGATIVE 03/01/2021 0847   LEUKOCYTESUR NEGATIVE 03/01/2021 0847    Sepsis Labs: Lactic Acid, Venous No results found for: LATICACIDVEN  MICROBIOLOGY: Recent Results (from the past 240 hour(s))  SARS CORONAVIRUS 2 (TAT 6-24 HRS) Nasopharyngeal Nasopharyngeal Swab     Status: None   Collection Time: 02/27/21  7:42 PM   Specimen: Nasopharyngeal Swab  Result Value Ref Range Status   SARS Coronavirus 2 NEGATIVE NEGATIVE Final    Comment: (NOTE) SARS-CoV-2 target nucleic acids are NOT DETECTED.  The SARS-CoV-2 RNA is generally detectable in upper and lower respiratory specimens during the acute phase of infection. Negative results do not preclude SARS-CoV-2 infection, do not rule out co-infections with other pathogens, and should not be used as the sole basis for treatment or other patient management decisions. Negative results must be combined with clinical observations, patient history, and epidemiological information. The expected result is Negative.  Fact Sheet for Patients: SugarRoll.be  Fact Sheet for Healthcare Providers: https://www.woods-mathews.com/  This test is not yet approved or cleared by the [place] FDA and  has been authorized for detection and/or diagnosis of SARS-CoV-2 by FDA under an Emergency Use Authorization (EUA). This EUA will remain  in effect (meaning this test can be used) for the duration of the COVID-19 declaration under Se ction 564(b)(1) of the Act, 21 U.S.C. section 360bbb-3(b)(1), unless the authorization is terminated or revoked sooner.  Performed at [facility], [place] [address], [place],  [place] [address]     RADIOLOGY STUDIES/RESULTS: No results found.   LOS: 5 days   [employer],  MD  [facility]    To contact the attending provider between 7A-7P or the covering provider during after hours 7P-7A, please log into the web site [email or web address] and access using universal [place] password for that web site. If you do not have the password, please call the hospital operator.  03/05/2021, 10:14 AM

## 2021-03-05 NOTE — Progress Notes (Signed)
Pt was given hydralazine '10mg'$  iv for elevated blood pressure. Medication was effective. Pt will receive scheduled antihypersives later this morning. Will continue to monitor.

## 2021-03-05 NOTE — Plan of Care (Signed)
  Problem: Education: Goal: Knowledge of General Education information will improve Description Including pain rating scale, medication(s)/side effects and non-pharmacologic comfort measures Outcome: Progressing   

## 2021-03-05 NOTE — Progress Notes (Signed)
Pt with easily palpable thrill left arm No numbness in hand Healing incision Will sign off  Ruta Hinds, MD [facility] Office: [phone]

## 2021-03-05 NOTE — Progress Notes (Signed)
[facility] NEPHROLOGY PROGRESS NOTE  Assessment/ Plan: Pt is a 25 y.o. yo male  with history of type 1 diabetes since very young age, retinopathy, uncontrolled hypertension on metoprolol, CKD 3 and was followed by nephrologist in [facility] with creatinine level 2.10 in 09/2019, presented with severe nausea, vomiting, decreased appetite and weakness for about 6 weeks, seen as a consultation for the management of advanced renal failure. SBP >200 and creatinine level around 26.  #Advanced renal failure most likely progressive CKD in the setting of uncontrolled diabetes and hypertension to new ESRD.  The creatinine level was around 2 back in 2021 and was seen by nephrologist in [facility].  He has uncontrolled diabetes and hypertension with poor follow-up.  Now he presents with around 6 weeks of uremic symptoms with creatinine level peaked at 26.  The CT scan ruled out hydronephrosis/obstruction. Urinalysis with proteinuria UPC 3.7 and microscopic hematuria.  Hep B, hep C, HIV, ANCA, SPEP negative.  ANA positive with unremarkable complements and antidsDNA negative.  Follow-up anti-GBM. The proteinuria due to uncontrolled diabetes. I am not sure how much benefit he will get with kidney biopsy at this time, he probably has a lot of chronic finding/scarring.  Patient is hesitant to do biopsy as well. He has been tolerating dialysis well, last treatment on 7/1 with 3 L ultrafiltration.  Plan for next HD on 7/4.  The AV fistula was placed by Dr. Donnetta Hutching on 7/1 and has good thrill and bruit. Social worker is following to arrange outpatient dialysis.  #Hypertensive urgency with renal failure: Blood pressure is still elevated.I will increase carvedilol to 25 mg twice a day.  Continue amlodipine, clonidine and Imdur.  We will attempt ultrafiltration during dialysis and titrate antihypertensives gradually.   #Metabolic acidosis with CKD: Now managed with dialysis.  DC IV sodium bicarbonate.   #Anemia  of chronic kidney disease: Iron saturation 39% with serum iron 92. Started IV iron and ESA.   #Secondary hyperparathyroidism: PTH 259 at goal for CKD.  Started PhosLo for hyperphosphatemia.  Subjective: Seen and examined at bedside.  He had AV fistula created yesterday.  He denies nausea vomiting chest pain shortness of breath.  Noted blood pressure has been running high.  Objective Vital signs in last 24 hours: Vitals:   03/04/21 2201 03/04/21 2216 03/05/21 0441 03/05/21 0600  BP:  (!) 144/76 (!) 195/87 (!) 149/97  Pulse: 89 88 97   Resp: 17 (!) 22    Temp:  98.4 F (36.9 C) (!) 100.5 F (38.1 C) 99.5 F (37.5 C)  TempSrc:  Oral Oral   SpO2: 99% 98%    Weight:      Height:       Weight change:   Intake/Output Summary (Last 24 hours) at 03/05/2021 0840 Last data filed at 03/04/2021 2216 Gross per 24 hour  Intake 460 ml  Output 3037 ml  Net -2577 ml        Labs: Basic Metabolic Panel: Recent Labs  Lab 03/03/21 0306 03/04/21 0113 03/05/21 0226  NA 133* 135 133*  K 4.4 5.3* 4.5  CL 96* 100 96*  CO2 '25 24 26  '$ GLUCOSE 152* 185* 206*  BUN 42* 59* 40*  CREATININE 12.61* 14.31* 10.23*  CALCIUM 8.4* 8.1* 8.3*  PHOS 6.3* 6.7* 4.3    Liver Function Tests: Recent Labs  Lab 02/27/21 1825 02/28/21 0630 03/01/21 0332 03/03/21 0306 03/04/21 0113 03/05/21 0226  AST 16 14*  --   --   --   --  ALT 15 15  --   --   --   --   ALKPHOS 103 93  --   --   --   --   BILITOT 0.5 0.7  --   --   --   --   PROT 7.0 6.0*  --   --   --   --   ALBUMIN 3.1* 2.7*   < > 2.5* 2.3* 2.3*   < > = values in this interval not displayed.    Recent Labs  Lab 02/27/21 1825  LIPASE 64*    No results for input(s): AMMONIA in the last 168 hours. CBC: Recent Labs  Lab 02/27/21 1825 02/28/21 0630 03/01/21 0332 03/02/21 0111 03/02/21 1142 03/03/21 0306  WBC 6.8 6.3 5.6 6.2 7.2 5.4  NEUTROABS 4.5 4.2  --   --   --   --   HGB 9.6* 8.4* 9.0* 9.0* 7.9* 8.5*  HCT 28.8* 25.7* 26.9*  27.1* 24.6* 26.1*  MCV 80.9 82.4 82.0 82.4 83.4 83.1  PLT 333 291 279 221 205 172    Cardiac Enzymes: Recent Labs  Lab 02/28/21 0005 02/28/21 0630 03/01/21 0332  CKTOTAL 1,116* 969* 774*    CBG: Recent Labs  Lab 03/04/21 1402 03/04/21 1502 03/04/21 1800 03/04/21 2249 03/05/21 0839  GLUCAP 146* 137* 216* 134* 168*     Iron Studies:  No results for input(s): IRON, TIBC, TRANSFERRIN, FERRITIN in the last 72 hours.  Studies/Results: No results found.  Medications: Infusions:     Scheduled Medications:  amLODipine  10 mg Oral Daily   calcium acetate  1,334 mg Oral TID WC   carvedilol  25 mg Oral BID WC   Chlorhexidine Gluconate Cloth  6 each Topical Q0600   Chlorhexidine Gluconate Cloth  6 each Topical Q0600   Chlorhexidine Gluconate Cloth  6 each Topical Q0600   Chlorhexidine Gluconate Cloth  6 each Topical Q0600   cloNIDine  0.2 mg Oral TID   darbepoetin (ARANESP) injection - DIALYSIS  60 mcg Intravenous Q Tue-HD   feeding supplement (NEPRO CARB STEADY)  237 mL Oral BID BM   heparin sodium (porcine)       insulin aspart  0-6 Units Subcutaneous TID WC   isosorbide mononitrate  30 mg Oral Daily   kidney failure book   Does not apply Once   multivitamin  1 tablet Oral QHS   Thrombi-Pad  4 each Topical Once    have reviewed scheduled and prn medications.  Physical Exam: General:NAD, comfortable Heart:RRR, s1s2 nl Lungs: Clear b/l, no crackle Abdomen:soft, Non-tender, non-distended Extremities: No LE edema Dialysis Access: Right IJ TDC, left AV fistula site clean, without bleeding, has good thrill and bruit.  Desmond Szabo Prasad Nelsy Madonna 03/05/2021,8:40 AM  LOS: 5 days

## 2021-03-05 NOTE — Plan of Care (Signed)
  Problem: Activity: Goal: Risk for activity intolerance will decrease Outcome: Progressing  Pt continues ambulating in room and getting oob for longer periods of time. Will continue to increase activities.

## 2021-03-06 LAB — GLUCOSE, CAPILLARY
Glucose-Capillary: 241 mg/dL — ABNORMAL HIGH (ref 70–99)
Glucose-Capillary: 192 mg/dL — ABNORMAL HIGH (ref 70–99)
Glucose-Capillary: 233 mg/dL — ABNORMAL HIGH (ref 70–99)
Glucose-Capillary: 167 mg/dL — ABNORMAL HIGH (ref 70–99)

## 2021-03-06 LAB — CBC
HCT: 22.5 % — ABNORMAL LOW (ref 39.0–52.0)
Hemoglobin: 7.2 g/dL — ABNORMAL LOW (ref 13.0–17.0)
MCH: 27.4 pg (ref 26.0–34.0)
MCHC: 32 g/dL (ref 30.0–36.0)
MCV: 85.6 fL (ref 80.0–100.0)
Platelets: 219 10*3/uL (ref 150–400)
RBC: 2.63 MIL/uL — ABNORMAL LOW (ref 4.22–5.81)
RDW: 12.4 % (ref 11.5–15.5)
WBC: 6 10*3/uL (ref 4.0–10.5)
nRBC: 0 % (ref 0.0–0.2)

## 2021-03-06 LAB — RENAL FUNCTION PANEL
Albumin: 2.3 g/dL — ABNORMAL LOW (ref 3.5–5.0)
Anion gap: 14 (ref 5–15)
BUN: 65 mg/dL — ABNORMAL HIGH (ref 6–20)
CO2: 23 mmol/L (ref 22–32)
Calcium: 8.4 mg/dL — ABNORMAL LOW (ref 8.9–10.3)
Chloride: 95 mmol/L — ABNORMAL LOW (ref 98–111)
Creatinine, Ser: 13.05 mg/dL — ABNORMAL HIGH (ref 0.61–1.24)
GFR, Estimated: 5 mL/min — ABNORMAL LOW (ref >60)
Glucose, Bld: 241 mg/dL — ABNORMAL HIGH (ref 70–99)
Phosphorus: 6.7 mg/dL — ABNORMAL HIGH (ref 2.5–4.6)
Potassium: 5.2 mmol/L — ABNORMAL HIGH (ref 3.5–5.1)
Sodium: 132 mmol/L — ABNORMAL LOW (ref 135–145)

## 2021-03-06 MED ORDER — CHLORHEXIDINE GLUCONATE CLOTH 2 % EX PADS
6.0000 | MEDICATED_PAD | Freq: Every day | CUTANEOUS | Status: DC
  Administered 2021-03-06 – 2021-03-12 (×7): 6 via TOPICAL

## 2021-03-06 MED ORDER — CLONIDINE HCL 0.2 MG PO TABS
0.3000 mg | ORAL_TABLET | Freq: Three times a day (TID) | ORAL | Status: DC
  Administered 2021-03-06 – 2021-03-12 (×16): 0.3 mg via ORAL
  Filled 2021-03-06 (×16): qty 1

## 2021-03-06 NOTE — Plan of Care (Signed)
  Problem: Health Behavior/Discharge Planning: Goal: Ability to manage health-related needs will improve Outcome: Progressing   Problem: Clinical Measurements: Goal: Cardiovascular complication will be avoided Outcome: Progressing   Problem: Nutrition: Goal: Adequate nutrition will be maintained Outcome: Progressing   Problem: Pain Managment: Goal: General experience of comfort will improve Outcome: Progressing

## 2021-03-06 NOTE — Progress Notes (Signed)
PROGRESS NOTE        PATIENT DETAILS Name: Bobby [name] Age: 25 y.o. Sex: male Date of Birth: 1996/03/02 Admit Date: 02/27/2021 Admitting Physician Evalee Mutton Kristeen Mans, MD PCP:Pcp, No  Brief Narrative: Patient is a 25 y.o. male with PMHx DM-1, HTN, CKD stage IIIb-presented with nausea/vomiting/weakness/decreased appetite-found to have progressive CKD with worsening renal failure.  Significant events: 6/26>> admit-nausea/vomiting/weakness-progressive CKD with worsening renal failure. 6/27>>started on HD  Significant studies: 6/26>> CT abdomen/pelvis: No acute intra-abdominal process-no hydronephrosis. 6/27>> A1c: 8.2 6/27>> HIV: Nonreactive 6/27>> ANA: Positive 6/27>> dsDNA:negative 6/27>> SSA antibody, SSB antibody: Negative 6/27>> anticentromere, anti-Jo-1: Negative 6/27>> c-ANCA/p-ANCA titers: Negative 6/27>> hepatitis C antibody: Nonreactive 6/27>> HBsAg: Nonreactive 6/27>> glomerular basement membrane antibody: Pending 6/27>> SPEP: No M spike 6/28>> UA:> 300 protein,+RBC  Antimicrobial therapy: None  Microbiology data: 6/26>> COVID PCR: Negative  Procedures : 6/27>> TDC placed by IR. [id number] left basilic vein transposition fistula by vascular surgery  Consults: Nephrology, IR, vascular surgery  DVT Prophylaxis : heparin injection 5,000 Units Start: 03/05/21 1400 Place and maintain sequential compression device Start: 03/03/21 0910   Subjective: No nausea/vomiting or diarrhea.  Lying comfortably in bed.  Assessment/Plan: Progressive renal failure/AKI superimposed on CKD stage IIIb-now likely ESRD: Suspected diabetic nephropathy-Per nephrology-this is likely ESRD-and further work-up/biopsy will not change outcome or management.  Uremic symptoms have significantly improved with initiation of HD-nephrology following and directing HD care.  Will need to await outpatient dialysis arrangement before consideration of discharge.    Hypertensive urgency: BP remains elevated-nephrology has increased dosage of clonidine today-continue current dosing of amlodipine, Coreg and Imdur.  Follow and adjust.    Nausea/vomiting: Has resolved.  Likely due to uremia-no significant abnormality seen on CT abdomen.  Normocytic anemia: Due to CKD-on Aranesp-watch closely and transfuse if significant drop in hemoglobin.  Nontraumatic rhabdomyolysis: CK downtrending-likely clinically insignificant.  Mildly prolonged QTC: Resolved   DM-1: CBG stable with SSI-follow and adjust.  Recent Labs    03/05/21 1614 03/05/21 2107 03/06/21 0811  GLUCAP 223* 178* 241*      Obesity: Estimated body mass index is 36.08 kg/m as calculated from the following:   Height as of this encounter: '6\' 5"'$  (1.956 m).   Weight as of this encounter: 138 kg.   Diet: Diet Order             Diet renal/carb modified with fluid restriction Diet-HS Snack? Nothing; Fluid restriction: 1200 mL Fluid; Room service appropriate? Yes; Fluid consistency: Thin  Diet effective now                    Code Status: Full code   Family Communication: Sister at bedside on 6/28  Disposition Plan: Status is: Inpatient  The patient will require care spanning > 2 midnights and should be moved to inpatient because: Inpatient level of care appropriate due to severity of illness  Dispo: The patient is from: Home              Anticipated d/c is to: Home              Patient currently is not medically stable to d/c.   Difficult to place patient No   Barriers to Discharge: AKI-on HD-watching for signs of renal recovery-still with uremic symptoms-not stable for discharge.  Antimicrobial agents: Anti-infectives (From admission, onward)    Start  Dose/Rate Route Frequency Ordered Stop   03/04/21 0600  cefUROXime (ZINACEF) 1.5 g in sodium chloride 0.9 % 100 mL IVPB        1.5 g 200 mL/hr over 30 Minutes Intravenous On call to O.R. 03/03/21 1018 03/04/21 1145    02/28/21 1030  ceFAZolin (ANCEF) IVPB 2g/100 mL premix        2 g 200 mL/hr over 30 Minutes Intravenous To Radiology 02/28/21 0943 02/28/21 1547        Time spent: 25 minutes-Greater than 50% of this time was spent in counseling, explanation of diagnosis, planning of further management, and coordination of care.  MEDICATIONS: Scheduled Meds:  amLODipine  10 mg Oral Daily   calcium acetate  1,334 mg Oral TID WC   carvedilol  25 mg Oral BID WC   Chlorhexidine Gluconate Cloth  6 each Topical Q0600   Chlorhexidine Gluconate Cloth  6 each Topical Q0600   Chlorhexidine Gluconate Cloth  6 each Topical Q0600   Chlorhexidine Gluconate Cloth  6 each Topical Q0600   Chlorhexidine Gluconate Cloth  6 each Topical Q0600   cloNIDine  0.3 mg Oral TID   darbepoetin (ARANESP) injection - DIALYSIS  60 mcg Intravenous Q Tue-HD   feeding supplement (NEPRO CARB STEADY)  237 mL Oral BID BM   heparin injection (subcutaneous)  5,000 Units Subcutaneous Q8H   insulin aspart  0-6 Units Subcutaneous TID WC   isosorbide mononitrate  30 mg Oral Daily   kidney failure book   Does not apply Once   multivitamin  1 tablet Oral QHS   Thrombi-Pad  4 each Topical Once   Continuous Infusions:   PRN Meds:.acetaminophen **OR** acetaminophen, fentaNYL (SUBLIMAZE) injection, hydrALAZINE, labetalol, oxyCODONE, polyvinyl alcohol   PHYSICAL EXAM: Vital signs: Vitals:   03/06/21 0418 03/06/21 0610 03/06/21 0800 03/06/21 0900  BP: (!) 187/88  (!) 201/101 (!) 202/101  Pulse:   91   Resp:   18   Temp: 98.5 F (36.9 C)  98.4 F (36.9 C) 98.5 F (36.9 C)  TempSrc: Oral  Oral   SpO2:   99%   Weight:  (!) 138 kg    Height:       Filed Weights   03/02/21 1120 03/02/21 1430 03/06/21 0610  Weight: 135 kg 133 kg (!) 138 kg   Body mass index is 36.08 kg/m.   Gen Exam:Alert awake-not in any distress HEENT:atraumatic, normocephalic Chest: B/L clear to auscultation anteriorly CVS:S1S2 regular Abdomen:soft non  tender, non distended Extremities:no edema Neurology: Non focal Skin: no rash   I have personally reviewed following labs and imaging studies  LABORATORY DATA: CBC: Recent Labs  Lab 02/27/21 1825 02/28/21 0630 03/01/21 0332 03/02/21 0111 03/02/21 1142 03/03/21 0306 03/06/21 0318  WBC 6.8 6.3 5.6 6.2 7.2 5.4 6.0  NEUTROABS 4.5 4.2  --   --   --   --   --   HGB 9.6* 8.4* 9.0* 9.0* 7.9* 8.5* 7.2*  HCT 28.8* 25.7* 26.9* 27.1* 24.6* 26.1* 22.5*  MCV 80.9 82.4 82.0 82.4 83.4 83.1 85.6  PLT 333 291 279 221 205 172 219     Basic Metabolic Panel: Recent Labs  Lab 03/02/21 0111 03/03/21 0306 03/04/21 0113 03/05/21 0226 03/06/21 0318  NA 135 133* 135 133* 132*  K 4.2 4.4 5.3* 4.5 5.2*  CL 100 96* 100 96* 95*  CO2 '24 25 24 26 23  '$ GLUCOSE 134* 152* 185* 206* 241*  BUN 67* 42* 59* 40* 65*  CREATININE 16.95*  12.61* 14.31* 10.23* 13.05*  CALCIUM 7.6* 8.4* 8.1* 8.3* 8.4*  MG 1.7  --   --   --   --   PHOS 6.3* 6.3* 6.7* 4.3 6.7*     GFR: Estimated Creatinine Clearance: 13.3 mL/min (A) (by C-G formula based on SCr of 13.05 mg/dL (H)).  Liver Function Tests: Recent Labs  Lab 02/27/21 1825 02/28/21 0630 03/01/21 0332 03/02/21 0111 03/03/21 0306 03/04/21 0113 03/05/21 0226 03/06/21 0318  AST 16 14*  --   --   --   --   --   --   ALT 15 15  --   --   --   --   --   --   ALKPHOS 103 93  --   --   --   --   --   --   BILITOT 0.5 0.7  --   --   --   --   --   --   PROT 7.0 6.0*  --   --   --   --   --   --   ALBUMIN 3.1* 2.7*   < > 2.5* 2.5* 2.3* 2.3* 2.3*   < > = values in this interval not displayed.    Recent Labs  Lab 02/27/21 1825  LIPASE 64*    No results for input(s): AMMONIA in the last 168 hours.  Coagulation Profile: No results for input(s): INR, PROTIME in the last 168 hours.  Cardiac Enzymes: Recent Labs  Lab 02/28/21 0005 02/28/21 0630 03/01/21 0332  CKTOTAL 1,116* 969* 774*     BNP (last 3 results) No results for input(s): PROBNP in the  last 8760 hours.  Lipid Profile: No results for input(s): CHOL, HDL, LDLCALC, TRIG, CHOLHDL, LDLDIRECT in the last 72 hours.   Thyroid Function Tests: No results for input(s): TSH, T4TOTAL, FREET4, T3FREE, THYROIDAB in the last 72 hours.   Anemia Panel: No results for input(s): VITAMINB12, FOLATE, FERRITIN, TIBC, IRON, RETICCTPCT in the last 72 hours.   Urine analysis:    Component Value Date/Time   COLORURINE YELLOW 03/01/2021 [place] 03/01/2021 0847   LABSPEC 1.010 03/01/2021 0847   PHURINE 7.0 03/01/2021 0847   GLUCOSEU 150 (A) 03/01/2021 0847   HGBUR SMALL (A) 03/01/2021 0847   BILIRUBINUR NEGATIVE 03/01/2021 0847   KETONESUR 5 (A) 03/01/2021 0847   PROTEINUR >=300 (A) 03/01/2021 0847   NITRITE NEGATIVE 03/01/2021 0847   LEUKOCYTESUR NEGATIVE 03/01/2021 0847    Sepsis Labs: Lactic Acid, Venous No results found for: LATICACIDVEN  MICROBIOLOGY: Recent Results (from the past 240 hour(s))  SARS CORONAVIRUS 2 (TAT 6-24 HRS) Nasopharyngeal Nasopharyngeal Swab     Status: None   Collection Time: 02/27/21  7:42 PM   Specimen: Nasopharyngeal Swab  Result Value Ref Range Status   SARS Coronavirus 2 NEGATIVE NEGATIVE Final    Comment: (NOTE) SARS-CoV-2 target nucleic acids are NOT DETECTED.  The SARS-CoV-2 RNA is generally detectable in upper and lower respiratory specimens during the acute phase of infection. Negative results do not preclude SARS-CoV-2 infection, do not rule out co-infections with other pathogens, and should not be used as the sole basis for treatment or other patient management decisions. Negative results must be combined with clinical observations, patient history, and epidemiological information. The expected result is Negative.  Fact Sheet for Patients: SugarRoll.be  Fact Sheet for Healthcare Providers: https://www.woods-mathews.com/  This test is not yet approved or cleared by the  [place] FDA and  has  been authorized for detection and/or diagnosis of SARS-CoV-2 by FDA under an Emergency Use Authorization (EUA). This EUA will remain  in effect (meaning this test can be used) for the duration of the COVID-19 declaration under Se ction 564(b)(1) of the Act, 21 U.S.C. section 360bbb-3(b)(1), unless the authorization is terminated or revoked sooner.  Performed at [facility], [place] [address] [place], [place] [address]     RADIOLOGY STUDIES/RESULTS: No results found.   LOS: 6 days   Oren Binet, MD  [facility]    To contact the attending provider between 7A-7P or the covering provider during after hours 7P-7A, please log into the web site [email or web address] and access using universal [facility] password for that web site. If you do not have the password, please call the hospital operator.  03/06/2021, 10:54 AM

## 2021-03-06 NOTE — Progress Notes (Signed)
[facility] NEPHROLOGY PROGRESS NOTE  Assessment/ Plan: Pt is a 25 y.o. yo male  with history of type 1 diabetes since very young age, retinopathy, uncontrolled hypertension on metoprolol, CKD 3 and was followed by nephrologist in [facility] with creatinine level 2.10 in 09/2019, presented with severe nausea, vomiting, decreased appetite and weakness for about 6 weeks, seen as a consultation for the management of advanced renal failure. SBP >200 and creatinine level around 26.  #Advanced renal failure most likely progressive CKD in the setting of uncontrolled diabetes and hypertension to new ESRD.  The creatinine level was around 2 back in 2021 and was seen by nephrologist in [facility].  He has uncontrolled diabetes and hypertension with poor follow-up.  Now he presents with around 6 weeks of uremic symptoms with creatinine level peaked at 26.  The CT scan ruled out hydronephrosis/obstruction. Urinalysis with proteinuria UPC 3.7 and microscopic hematuria.  Hep B, hep C, HIV, ANCA, SPEP negative.  ANA positive with unremarkable complements and antidsDNA negative.  Follow-up anti-GBM. The proteinuria due to uncontrolled diabetes. I am not sure how much benefit he will get with kidney biopsy at this time, he probably has a lot of chronic finding/scarring.  Patient is hesitant to do biopsy as well. He has been tolerating dialysis well, last treatment on 7/1 with 3 L ultrafiltration.  Plan for next HD on 7/4.  The AV fistula was placed by Dr. Donnetta Hutching on 7/1 and has good thrill and bruit. Social worker is following to arrange outpatient dialysis.  #Hypertensive urgency with renal failure: I have been titrating his antihypertensive medications every day is still elevated.  I will increase clonidine to 0.3 mg 3 times daily.  The carvedilol was increased to 25 mg twice a day yesterday.  Continue amlodipine and Imdur.  Ultrafiltration during dialysis and titrate antihypertensives  gradually.  #Metabolic acidosis with CKD: Now managed with dialysis.  DC IV sodium bicarbonate.   #Anemia of chronic kidney disease: Iron saturation 39% with serum iron 92. Started IV iron and ESA.   #Secondary hyperparathyroidism: PTH 259 at goal for CKD.  Started PhosLo for hyperphosphatemia.  Subjective: Seen and examined at bedside.  No new event.  Denies nausea vomiting chest pain shortness of breath.  BP remains elevated.  Objective Vital signs in last 24 hours: Vitals:   03/06/21 0034 03/06/21 0418 03/06/21 0610 03/06/21 0800  BP: (!) 183/83 (!) 187/88  (!) 201/101  Pulse:    91  Resp:    18  Temp: 98.6 F (37 C) 98.5 F (36.9 C)  98.4 F (36.9 C)  TempSrc: Oral Oral  Oral  SpO2:    99%  Weight:   (!) 138 kg   Height:       Weight change:   Intake/Output Summary (Last 24 hours) at 03/06/2021 0906 Last data filed at 03/05/2021 0946 Gross per 24 hour  Intake 120 ml  Output --  Net 120 ml        Labs: Basic Metabolic Panel: Recent Labs  Lab 03/04/21 0113 03/05/21 0226 03/06/21 0318  NA 135 133* 132*  K 5.3* 4.5 5.2*  CL 100 96* 95*  CO2 '24 26 23  '$ GLUCOSE 185* 206* 241*  BUN 59* 40* 65*  CREATININE 14.31* 10.23* 13.05*  CALCIUM 8.1* 8.3* 8.4*  PHOS 6.7* 4.3 6.7*    Liver Function Tests: Recent Labs  Lab 02/27/21 1825 02/28/21 0630 03/01/21 0332 03/04/21 0113 03/05/21 0226 03/06/21 0318  AST 16 14*  --   --   --   --  ALT 15 15  --   --   --   --   ALKPHOS 103 93  --   --   --   --   BILITOT 0.5 0.7  --   --   --   --   PROT 7.0 6.0*  --   --   --   --   ALBUMIN 3.1* 2.7*   < > 2.3* 2.3* 2.3*   < > = values in this interval not displayed.    Recent Labs  Lab 02/27/21 1825  LIPASE 64*    No results for input(s): AMMONIA in the last 168 hours. CBC: Recent Labs  Lab 02/27/21 1825 02/28/21 0630 03/01/21 0332 03/02/21 0111 03/02/21 1142 03/03/21 0306 03/06/21 0318  WBC 6.8 6.3 5.6 6.2 7.2 5.4 6.0  NEUTROABS 4.5 4.2  --   --   --    --   --   HGB 9.6* 8.4* 9.0* 9.0* 7.9* 8.5* 7.2*  HCT 28.8* 25.7* 26.9* 27.1* 24.6* 26.1* 22.5*  MCV 80.9 82.4 82.0 82.4 83.4 83.1 85.6  PLT 333 291 279 221 205 172 219    Cardiac Enzymes: Recent Labs  Lab 02/28/21 0005 02/28/21 0630 03/01/21 0332  CKTOTAL 1,116* 969* 774*    CBG: Recent Labs  Lab 03/05/21 0839 03/05/21 1155 03/05/21 1614 03/05/21 2107 03/06/21 0811  GLUCAP 168* 225* 223* 178* 241*     Iron Studies:  No results for input(s): IRON, TIBC, TRANSFERRIN, FERRITIN in the last 72 hours.  Studies/Results: No results found.  Medications: Infusions:     Scheduled Medications:  amLODipine  10 mg Oral Daily   calcium acetate  1,334 mg Oral TID WC   carvedilol  25 mg Oral BID WC   Chlorhexidine Gluconate Cloth  6 each Topical Q0600   Chlorhexidine Gluconate Cloth  6 each Topical Q0600   Chlorhexidine Gluconate Cloth  6 each Topical Q0600   Chlorhexidine Gluconate Cloth  6 each Topical Q0600   cloNIDine  0.3 mg Oral TID   darbepoetin (ARANESP) injection - DIALYSIS  60 mcg Intravenous Q Tue-HD   feeding supplement (NEPRO CARB STEADY)  237 mL Oral BID BM   heparin injection (subcutaneous)  5,000 Units Subcutaneous Q8H   insulin aspart  0-6 Units Subcutaneous TID WC   isosorbide mononitrate  30 mg Oral Daily   kidney failure book   Does not apply Once   multivitamin  1 tablet Oral QHS   Thrombi-Pad  4 each Topical Once    have reviewed scheduled and prn medications.  Physical Exam: General:NAD, comfortable Heart:RRR, s1s2 nl Lungs: Clear b/l, no crackle Abdomen:soft, Non-tender, non-distended Extremities: No LE edema Dialysis Access: Right IJ TDC, left AV fistula site clean, without bleeding, has good thrill and bruit.  Daisi Kentner Tanna Furry 03/06/2021,9:06 AM  LOS: 6 days

## 2021-03-06 NOTE — Progress Notes (Signed)
Pt was given prn medication for elevated b/p of 187/88. Will re-evaluate and tx as indicated.

## 2021-03-07 LAB — RENAL FUNCTION PANEL
Albumin: 2.2 g/dL — ABNORMAL LOW (ref 3.5–5.0)
Anion gap: 13 (ref 5–15)
BUN: 86 mg/dL — ABNORMAL HIGH (ref 6–20)
CO2: 22 mmol/L (ref 22–32)
Calcium: 8.6 mg/dL — ABNORMAL LOW (ref 8.9–10.3)
Chloride: 96 mmol/L — ABNORMAL LOW (ref 98–111)
Creatinine, Ser: 15.03 mg/dL — ABNORMAL HIGH (ref 0.61–1.24)
GFR, Estimated: 4 mL/min — ABNORMAL LOW (ref >60)
Glucose, Bld: 183 mg/dL — ABNORMAL HIGH (ref 70–99)
Phosphorus: 8 mg/dL — ABNORMAL HIGH (ref 2.5–4.6)
Potassium: 5.8 mmol/L — ABNORMAL HIGH (ref 3.5–5.1)
Sodium: 131 mmol/L — ABNORMAL LOW (ref 135–145)

## 2021-03-07 LAB — CBC
HCT: 22.2 % — ABNORMAL LOW (ref 39.0–52.0)
Hemoglobin: 7.1 g/dL — ABNORMAL LOW (ref 13.0–17.0)
MCH: 27.4 pg (ref 26.0–34.0)
MCHC: 32 g/dL (ref 30.0–36.0)
MCV: 85.7 fL (ref 80.0–100.0)
Platelets: 246 10*3/uL (ref 150–400)
RBC: 2.59 MIL/uL — ABNORMAL LOW (ref 4.22–5.81)
RDW: 12.3 % (ref 11.5–15.5)
WBC: 6.4 10*3/uL (ref 4.0–10.5)
nRBC: 0 % (ref 0.0–0.2)

## 2021-03-07 LAB — GLUCOSE, CAPILLARY
Glucose-Capillary: 188 mg/dL — ABNORMAL HIGH (ref 70–99)
Glucose-Capillary: 158 mg/dL — ABNORMAL HIGH (ref 70–99)
Glucose-Capillary: 263 mg/dL — ABNORMAL HIGH (ref 70–99)
Glucose-Capillary: 201 mg/dL — ABNORMAL HIGH (ref 70–99)

## 2021-03-07 MED ORDER — LIDOCAINE HCL (PF) 1 % IJ SOLN: 5.0000 mL | INTRAMUSCULAR | Status: DC | PRN

## 2021-03-07 MED ORDER — HEPARIN SODIUM (PORCINE) 1000 UNIT/ML DIALYSIS: 20.0000 [IU]/kg | INTRAMUSCULAR | Status: DC | PRN

## 2021-03-07 MED ORDER — PENTAFLUOROPROP-TETRAFLUOROETH EX AERO: 1.0000 "application " | INHALATION_SPRAY | CUTANEOUS | Status: DC | PRN

## 2021-03-07 MED ORDER — ALTEPLASE 2 MG IJ SOLR: 2.0000 mg | Freq: Once | INTRAMUSCULAR | Status: DC | PRN

## 2021-03-07 MED ORDER — HEPARIN SODIUM (PORCINE) 1000 UNIT/ML DIALYSIS: 1000.0000 [IU] | INTRAMUSCULAR | Status: DC | PRN

## 2021-03-07 MED ORDER — POLYETHYLENE GLYCOL 3350 17 G PO PACK
17.0000 g | PACK | Freq: Every day | ORAL | Status: DC
  Administered 2021-03-07 – 2021-03-12 (×5): 17 g via ORAL
  Filled 2021-03-07 (×5): qty 1

## 2021-03-07 MED ORDER — SODIUM CHLORIDE 0.9 % IV SOLN: 100.0000 mL | INTRAVENOUS | Status: DC | PRN

## 2021-03-07 MED ORDER — HEPARIN SODIUM (PORCINE) 1000 UNIT/ML IJ SOLN
INTRAMUSCULAR | Status: AC
  Administered 2021-03-07: 3800 [IU] via INTRAVENOUS_CENTRAL
  Filled 2021-03-07: qty 4

## 2021-03-07 NOTE — Progress Notes (Signed)
PROGRESS NOTE        PATIENT DETAILS Name: Bobby [name] Age: 25 y.o. Sex: male Date of Birth: 1996-01-07 Admit Date: 02/27/2021 Admitting Physician Evalee Mutton Kristeen Mans, MD PCP:Pcp, No  Brief Narrative: Patient is a 25 y.o. male with PMHx DM-1, HTN, CKD stage IIIb-presented with nausea/vomiting/weakness/decreased appetite-found to have progressive CKD with worsening renal failure.  Significant events: 6/26>> admit-nausea/vomiting/weakness-progressive CKD with worsening renal failure. 6/27>>started on HD  Significant studies: 6/26>> CT abdomen/pelvis: No acute intra-abdominal process-no hydronephrosis. 6/27>> A1c: 8.2 6/27>> HIV: Nonreactive 6/27>> ANA: Positive 6/27>> dsDNA:negative 6/27>> SSA antibody, SSB antibody: Negative 6/27>> anticentromere, anti-Jo-1: Negative 6/27>> c-ANCA/p-ANCA titers: Negative 6/27>> hepatitis C antibody: Nonreactive 6/27>> HBsAg: Nonreactive 6/27>> glomerular basement membrane antibody: Pending 6/27>> SPEP: No M spike 6/28>> UA:> 300 protein,+RBC  Antimicrobial therapy: None  Microbiology data: 6/26>> COVID PCR: Negative  Procedures : 6/27>> TDC placed by IR. [id number] left basilic vein transposition fistula by vascular surgery  Consults: Nephrology, IR, vascular surgery  DVT Prophylaxis : heparin injection 5,000 Units Start: 03/05/21 1400 Place and maintain sequential compression device Start: 03/03/21 0910   Subjective: Seen in hemodialysis-lying comfortably in bed-no nausea vomiting or diarrhea.  Assessment/Plan: Progressive renal failure/AKI superimposed on CKD stage IIIb-now likely ESRD: Suspected diabetic nephropathy-Per nephrology-this is likely ESRD-and further work-up/biopsy will not change outcome or management.  Uremic symptoms have significantly improved with initiation of HD-nephrology following and directing HD care.  Will need to await outpatient dialysis arrangement before consideration of  discharge.   Hypertensive urgency: BP remains elevated but better within the past few days-continue current dosing of clonidine, amlodipine, Coreg and Imdur.  Follow and adjust.   Nausea/vomiting: Has resolved.  Likely due to uremia-no significant abnormality seen on CT abdomen.  Normocytic anemia: Due to CKD-on Aranesp-watch closely and transfuse if significant drop in hemoglobin.  Nontraumatic rhabdomyolysis: CK downtrending-likely clinically insignificant.  Mildly prolonged QTC: Resolved   DM-1: CBG stable with SSI-follow and adjust.  Recent Labs    03/06/21 2118 03/07/21 0807 03/07/21 1227  GLUCAP 167* 188* 158*      Obesity: Estimated body mass index is 35.14 kg/m as calculated from the following:   Height as of this encounter: '6\' 5"'$  (1.956 m).   Weight as of this encounter: 134.4 kg.   Diet: Diet Order             Diet renal/carb modified with fluid restriction Diet-HS Snack? Nothing; Fluid restriction: 1200 mL Fluid; Room service appropriate? Yes; Fluid consistency: Thin  Diet effective now                    Code Status: Full code   Family Communication: Sister at bedside on 6/28  Disposition Plan: Status is: Inpatient  The patient will require care spanning > 2 midnights and should be moved to inpatient because: Inpatient level of care appropriate due to severity of illness  Dispo: The patient is from: Home              Anticipated d/c is to: Home              Patient currently is not medically stable to d/c.   Difficult to place patient No   Barriers to Discharge: AKI-on HD-watching for signs of renal recovery-still with uremic symptoms-not stable for discharge.  Antimicrobial agents: Anti-infectives (From admission, onward)    Start  Dose/Rate Route Frequency Ordered Stop   03/04/21 0600  cefUROXime (ZINACEF) 1.5 g in sodium chloride 0.9 % 100 mL IVPB        1.5 g 200 mL/hr over 30 Minutes Intravenous On call to O.R. 03/03/21 1018  03/04/21 1145   02/28/21 1030  ceFAZolin (ANCEF) IVPB 2g/100 mL premix        2 g 200 mL/hr over 30 Minutes Intravenous To Radiology 02/28/21 0943 02/28/21 1547        Time spent: 15 minutes-Greater than 50% of this time was spent in counseling, explanation of diagnosis, planning of further management, and coordination of care.  MEDICATIONS: Scheduled Meds:  amLODipine  10 mg Oral Daily   calcium acetate  1,334 mg Oral TID WC   carvedilol  25 mg Oral BID WC   Chlorhexidine Gluconate Cloth  6 each Topical Q0600   Chlorhexidine Gluconate Cloth  6 each Topical Q0600   Chlorhexidine Gluconate Cloth  6 each Topical Q0600   Chlorhexidine Gluconate Cloth  6 each Topical Q0600   Chlorhexidine Gluconate Cloth  6 each Topical Q0600   cloNIDine  0.3 mg Oral TID   darbepoetin (ARANESP) injection - DIALYSIS  60 mcg Intravenous Q Tue-HD   feeding supplement (NEPRO CARB STEADY)  237 mL Oral BID BM   heparin injection (subcutaneous)  5,000 Units Subcutaneous Q8H   insulin aspart  0-6 Units Subcutaneous TID WC   isosorbide mononitrate  30 mg Oral Daily   kidney failure book   Does not apply Once   multivitamin  1 tablet Oral QHS   Thrombi-Pad  4 each Topical Once   Continuous Infusions:   PRN Meds:.acetaminophen **OR** acetaminophen, fentaNYL (SUBLIMAZE) injection, hydrALAZINE, labetalol, oxyCODONE, polyvinyl alcohol   PHYSICAL EXAM: Vital signs: Vitals:   03/07/21 1056 03/07/21 1126 03/07/21 1147 03/07/21 1311  BP: (!) 178/87 (!) 179/74 (!) 184/90 (!) 181/92  Pulse: 90 90 91   Resp: 18 (!) 27 (!) 24 (!) 21  Temp:   98.7 F (37.1 C)   TempSrc:   Oral   SpO2:   98% 97%  Weight:   134.4 kg   Height:       Filed Weights   03/06/21 0610 03/07/21 0740 03/07/21 1147  Weight: (!) 138 kg (!) 137.4 kg 134.4 kg   Body mass index is 35.14 kg/m.   Gen Exam:Alert awake-not in any distress HEENT:atraumatic, normocephalic Chest: B/L clear to auscultation anteriorly CVS:S1S2  regular Abdomen:soft non tender, non distended Extremities:no edema Neurology: Non focal Skin: no rash   I have personally reviewed following labs and imaging studies  LABORATORY DATA: CBC: Recent Labs  Lab 03/02/21 0111 03/02/21 1142 03/03/21 0306 03/06/21 0318 03/07/21 0800  WBC 6.2 7.2 5.4 6.0 6.4  HGB 9.0* 7.9* 8.5* 7.2* 7.1*  HCT 27.1* 24.6* 26.1* 22.5* 22.2*  MCV 82.4 83.4 83.1 85.6 85.7  PLT 221 205 172 219 246     Basic Metabolic Panel: Recent Labs  Lab 03/02/21 0111 03/03/21 0306 03/04/21 0113 03/05/21 0226 03/06/21 0318 03/07/21 0327  NA 135 133* 135 133* 132* 131*  K 4.2 4.4 5.3* 4.5 5.2* 5.8*  CL 100 96* 100 96* 95* 96*  CO2 '24 25 24 26 23 22  '$ GLUCOSE 134* 152* 185* 206* 241* 183*  BUN 67* 42* 59* 40* 65* 86*  CREATININE 16.95* 12.61* 14.31* 10.23* 13.05* 15.03*  CALCIUM 7.6* 8.4* 8.1* 8.3* 8.4* 8.6*  MG 1.7  --   --   --   --   --  PHOS 6.3* 6.3* 6.7* 4.3 6.7* 8.0*     GFR: Estimated Creatinine Clearance: 11.4 mL/min (A) (by C-G formula based on SCr of 15.03 mg/dL (H)).  Liver Function Tests: Recent Labs  Lab 03/03/21 0306 03/04/21 0113 03/05/21 0226 03/06/21 0318 03/07/21 0327  ALBUMIN 2.5* 2.3* 2.3* 2.3* 2.2*    No results for input(s): LIPASE, AMYLASE in the last 168 hours.  No results for input(s): AMMONIA in the last 168 hours.  Coagulation Profile: No results for input(s): INR, PROTIME in the last 168 hours.  Cardiac Enzymes: Recent Labs  Lab 03/01/21 0332  CKTOTAL 774*     BNP (last 3 results) No results for input(s): PROBNP in the last 8760 hours.  Lipid Profile: No results for input(s): CHOL, HDL, LDLCALC, TRIG, CHOLHDL, LDLDIRECT in the last 72 hours.   Thyroid Function Tests: No results for input(s): TSH, T4TOTAL, FREET4, T3FREE, THYROIDAB in the last 72 hours.   Anemia Panel: No results for input(s): VITAMINB12, FOLATE, FERRITIN, TIBC, IRON, RETICCTPCT in the last 72 hours.   Urine analysis:     Component Value Date/Time   COLORURINE YELLOW 03/01/2021 [place] 03/01/2021 0847   LABSPEC 1.010 03/01/2021 0847   PHURINE 7.0 03/01/2021 0847   GLUCOSEU 150 (A) 03/01/2021 0847   HGBUR SMALL (A) 03/01/2021 0847   BILIRUBINUR NEGATIVE 03/01/2021 0847   KETONESUR 5 (A) 03/01/2021 0847   PROTEINUR >=300 (A) 03/01/2021 0847   NITRITE NEGATIVE 03/01/2021 0847   LEUKOCYTESUR NEGATIVE 03/01/2021 0847    Sepsis Labs: Lactic Acid, Venous No results found for: LATICACIDVEN  MICROBIOLOGY: Recent Results (from the past 240 hour(s))  SARS CORONAVIRUS 2 (TAT 6-24 HRS) Nasopharyngeal Nasopharyngeal Swab     Status: None   Collection Time: 02/27/21  7:42 PM   Specimen: Nasopharyngeal Swab  Result Value Ref Range Status   SARS Coronavirus 2 NEGATIVE NEGATIVE Final    Comment: (NOTE) SARS-CoV-2 target nucleic acids are NOT DETECTED.  The SARS-CoV-2 RNA is generally detectable in upper and lower respiratory specimens during the acute phase of infection. Negative results do not preclude SARS-CoV-2 infection, do not rule out co-infections with other pathogens, and should not be used as the sole basis for treatment or other patient management decisions. Negative results must be combined with clinical observations, patient history, and epidemiological information. The expected result is Negative.  Fact Sheet for Patients: SugarRoll.be  Fact Sheet for Healthcare Providers: https://www.woods-mathews.com/  This test is not yet approved or cleared by the [place] FDA and  has been authorized for detection and/or diagnosis of SARS-CoV-2 by FDA under an Emergency Use Authorization (EUA). This EUA will remain  in effect (meaning this test can be used) for the duration of the COVID-19 declaration under Se ction 564(b)(1) of the Act, 21 U.S.C. section 360bbb-3(b)(1), unless the authorization is terminated or revoked  sooner.  Performed at [facility], [place] [address] [place], [place] [address]     RADIOLOGY STUDIES/RESULTS: No results found.   LOS: 7 days   Oren Binet, MD  [facility]    To contact the attending provider between 7A-7P or the covering provider during after hours 7P-7A, please log into the web site [email or web address] and access using universal [place] password for that web site. If you do not have the password, please call the hospital operator.  03/07/2021, 1:15 PM

## 2021-03-07 NOTE — Progress Notes (Signed)
[facility] NEPHROLOGY PROGRESS NOTE  Assessment/ Plan: Pt is a 25 y.o. yo male  with history of type 1 diabetes since very young age, retinopathy, uncontrolled hypertension on metoprolol, CKD 3 and was followed by nephrologist in [facility] with creatinine level 2.10 in 09/2019, presented with severe nausea, vomiting, decreased appetite and weakness for about 6 weeks, seen as a consultation for the management of advanced renal failure. SBP >200 and creatinine level around 26.  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  The creatinine level was around 2 back in 2021 and was seen by nephrologist in [facility].  He has uncontrolled diabetes and hypertension with poor follow-up.  Now he presents with around 6 weeks of uremic symptoms with creatinine level peaked at 26.  The CT scan ruled out hydronephrosis/obstruction. Urinalysis with proteinuria UPC 3.7 and microscopic hematuria.  Hep B, hep C, HIV, ANCA, SPEP negative.  ANA positive with unremarkable complements and antidsDNA negative.  Follow-up anti-GBM. The proteinuria due to uncontrolled diabetes. I am not sure how much benefit he will get with kidney biopsy at this time, he probably has a lot of chronic finding/scarring.  Patient is hesitant to do biopsy as well. Of note, he has had nephrotic range proteinuria even back in 2020 (UPC 4.7g, result in careeverywhere) -The AV fistula was placed by Dr. Donnetta Hutching on 7/1 and has good thrill and bruit. Social worker is following to arrange outpatient dialysis. -Maintaining on MWF schedule for HD, HD today, next HD Wed 7/6  Hypertensive urgency with renal failure: I have been titrating his antihypertensive medications every day is still elevated.  I will increase clonidine to 0.3 mg 3 times daily.  The carvedilol was increased to 25 mg twice a day yesterday.  Continue amlodipine and Imdur.  Ultrafiltration during dialysis  and titrate antihypertensives gradually. Consider checking orthostatci vitals  Metabolic acidosis with CKD: Now managed with dialysis.    Anemia of chronic kidney disease: Iron saturation 39% with serum iron 92. Started IV iron and ESA.   Secondary hyperparathyroidism: PTH 259 at goal for CKD.  Started PhosLo for hyperphosphatemia.  Subjective: Seen and examined at bedside.  No acute event overnight. Reports some dizziness when he tries to stand up. No falls.  Objective Vital signs in last 24 hours: Vitals:   03/06/21 2202 03/06/21 2341 03/07/21 0050 03/07/21 0421  BP: (!) 141/85 (!) 164/89 (!) 174/88 (!) 169/85  Pulse:      Resp:  18  18  Temp:  98.5 F (36.9 C)  97.9 F (36.6 C)  TempSrc:  Oral  Axillary  SpO2:      Weight:      Height:       Weight change:  No intake or output data in the 24 hours ending 03/07/21 0735     Labs: Basic Metabolic Panel: Recent Labs  Lab 03/05/21 0226 03/06/21 0318 03/07/21 0327  NA 133* 132* 131*  K 4.5 5.2* 5.8*  CL 96* 95* 96*  CO2 '26 23 22  '$ GLUCOSE 206* 241* 183*  BUN 40* 65* 86*  CREATININE 10.23* 13.05* 15.03*  CALCIUM 8.3* 8.4* 8.6*  PHOS 4.3 6.7* 8.0*   Liver Function Tests: Recent Labs  Lab 03/05/21 0226 03/06/21 0318 03/07/21 0327  ALBUMIN 2.3* 2.3* 2.2*   No results for input(s): LIPASE, AMYLASE in the last 168 hours. No results for input(s): AMMONIA in the last 168 hours. CBC: Recent Labs  Lab 03/01/21 0332 03/02/21 0111 03/02/21 1142 03/03/21 0306 03/06/21 0318  WBC 5.6 6.2 7.2 5.4 6.0  HGB 9.0* 9.0* 7.9* 8.5* 7.2*  HCT 26.9* 27.1* 24.6* 26.1* 22.5*  MCV 82.0 82.4 83.4 83.1 85.6  PLT 279 221 205 172 219   Cardiac Enzymes: Recent Labs  Lab 03/01/21 0332  CKTOTAL 774*   CBG: Recent Labs  Lab 03/05/21 2107 03/06/21 0811 03/06/21 1151 03/06/21 1559 03/06/21 2118  GLUCAP 178* 241* 192* 233* 167*    Iron Studies:  No results for input(s): IRON, TIBC, TRANSFERRIN, FERRITIN in the last 72  hours.  Studies/Results: No results found.  Medications: Infusions:  sodium chloride     sodium chloride       Scheduled Medications:  amLODipine  10 mg Oral Daily   calcium acetate  1,334 mg Oral TID WC   carvedilol  25 mg Oral BID WC   Chlorhexidine Gluconate Cloth  6 each Topical Q0600   Chlorhexidine Gluconate Cloth  6 each Topical Q0600   Chlorhexidine Gluconate Cloth  6 each Topical Q0600   Chlorhexidine Gluconate Cloth  6 each Topical Q0600   Chlorhexidine Gluconate Cloth  6 each Topical Q0600   cloNIDine  0.3 mg Oral TID   darbepoetin (ARANESP) injection - DIALYSIS  60 mcg Intravenous Q Tue-HD   feeding supplement (NEPRO CARB STEADY)  237 mL Oral BID BM   heparin injection (subcutaneous)  5,000 Units Subcutaneous Q8H   insulin aspart  0-6 Units Subcutaneous TID WC   isosorbide mononitrate  30 mg Oral Daily   kidney failure book   Does not apply Once   multivitamin  1 tablet Oral QHS   Thrombi-Pad  4 each Topical Once    have reviewed scheduled and prn medications.  Physical Exam: General:NAD, comfortable Heart:RRR, s1s2 nl Lungs: Clear b/l, no crackle Abdomen:soft, Non-tender, non-distended Extremities: No LE edema Dialysis Access: Right IJ TDC, left AV fistula site clean, without bleeding, has good thrill and bruit.  Abuk Selleck 03/07/2021,7:35 AM  LOS: 7 days

## 2021-03-08 ENCOUNTER — Inpatient Hospital Stay (HOSPITAL_COMMUNITY): Payer: Medicaid Other

## 2021-03-08 DIAGNOSIS — Z23 Encounter for immunization: Secondary | ICD-10-CM | POA: Insufficient documentation

## 2021-03-08 DIAGNOSIS — T782XXA Anaphylactic shock, unspecified, initial encounter: Secondary | ICD-10-CM | POA: Insufficient documentation

## 2021-03-08 DIAGNOSIS — T7840XA Allergy, unspecified, initial encounter: Secondary | ICD-10-CM | POA: Insufficient documentation

## 2021-03-08 DIAGNOSIS — Z992 Dependence on renal dialysis: Secondary | ICD-10-CM | POA: Insufficient documentation

## 2021-03-08 DIAGNOSIS — D509 Iron deficiency anemia, unspecified: Secondary | ICD-10-CM | POA: Insufficient documentation

## 2021-03-08 DIAGNOSIS — D689 Coagulation defect, unspecified: Secondary | ICD-10-CM | POA: Insufficient documentation

## 2021-03-08 LAB — RENAL FUNCTION PANEL
Albumin: 2.2 g/dL — ABNORMAL LOW (ref 3.5–5.0)
Anion gap: 11 (ref 5–15)
BUN: 56 mg/dL — ABNORMAL HIGH (ref 6–20)
CO2: 27 mmol/L (ref 22–32)
Calcium: 8.3 mg/dL — ABNORMAL LOW (ref 8.9–10.3)
Chloride: 92 mmol/L — ABNORMAL LOW (ref 98–111)
Creatinine, Ser: 9.95 mg/dL — ABNORMAL HIGH (ref 0.61–1.24)
GFR, Estimated: 7 mL/min — ABNORMAL LOW (ref >60)
Glucose, Bld: 229 mg/dL — ABNORMAL HIGH (ref 70–99)
Phosphorus: 6.5 mg/dL — ABNORMAL HIGH (ref 2.5–4.6)
Potassium: 4.7 mmol/L (ref 3.5–5.1)
Sodium: 130 mmol/L — ABNORMAL LOW (ref 135–145)

## 2021-03-08 LAB — GLUCOSE, CAPILLARY
Glucose-Capillary: 183 mg/dL — ABNORMAL HIGH (ref 70–99)
Glucose-Capillary: 212 mg/dL — ABNORMAL HIGH (ref 70–99)
Glucose-Capillary: 193 mg/dL — ABNORMAL HIGH (ref 70–99)
Glucose-Capillary: 211 mg/dL — ABNORMAL HIGH (ref 70–99)

## 2021-03-08 LAB — GLOMERULAR BASEMENT MEMBRANE ANTIBODIES: GBM Ab: <0.2 units (ref 0.0–0.9)

## 2021-03-08 MED ORDER — DARBEPOETIN ALFA 60 MCG/0.3ML IJ SOSY: 60.0000 ug | PREFILLED_SYRINGE | INTRAMUSCULAR | Status: DC

## 2021-03-08 MED ORDER — LORAZEPAM 2 MG/ML IJ SOLN
0.5000 mg | Freq: Once | INTRAMUSCULAR | Status: AC | PRN
  Administered 2021-03-09: 0.5 mg via INTRAVENOUS
  Filled 2021-03-08: qty 1

## 2021-03-08 NOTE — Progress Notes (Signed)
[facility] NEPHROLOGY PROGRESS NOTE  Assessment/ Plan: Pt is a 25 y.o. yo male  with history of type 1 diabetes since very young age, retinopathy, uncontrolled hypertension on metoprolol, CKD 3 and was followed by nephrologist in [facility] with creatinine level 2.10 in 09/2019, presented with severe nausea, vomiting, decreased appetite and weakness for about 6 weeks, seen as a consultation for the management of advanced renal failure. SBP >200 and creatinine level around 26.  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  The creatinine level was around 2 back in 2021 and was seen by nephrologist in [facility].  He has uncontrolled diabetes and hypertension with poor follow-up.  Now he presents with around 6 weeks of uremic symptoms with creatinine level peaked at 26.  The CT scan ruled out hydronephrosis/obstruction. Urinalysis with proteinuria UPC 3.7 and microscopic hematuria.  Hep B, hep C, HIV, ANCA, SPEP negative.  ANA positive with unremarkable complements and antidsDNA negative.  Follow-up anti-GBM. 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 I am not sure how much benefit he will get with kidney biopsy at this time, he probably has a lot of chronic finding/scarring and I believe that there will not be much change in his current management. Offered biopsy to [place] and at this time he would like to hold off on any further procedures especially given that his treatment would still likely stay the same. Of note, he has had nephrotic range proteinuria even back in 2020 (UPC 4.7g, result in careeverywhere) hence expecting to see chronic changes -The AV fistula was placed by Dr. Donnetta Hutching on 7/1 and has good thrill and bruit. -Maintaining on MWF schedule for HD, next HD Wed 7/6 -clip for outpatient hd placement  Hypertensive urgency with renal failure: I have been  titrating his antihypertensive medications every day is still elevated.  I will increase clonidine to 0.3 mg 3 times daily.  The carvedilol was increased to 25 mg twice a day yesterday.  Continue amlodipine and Imdur.  Ultrafiltration during dialysis and titrate antihypertensives gradually. -Will first check orthostatic vitals before making any further changes  Metabolic acidosis with CKD: Now managed with dialysis.    Anemia of chronic kidney disease: Iron saturation 39% with serum iron 92. Started IV iron and ESA.   Secondary hyperparathyroidism: PTH 259 at goal for CKD.  Started PhosLo for hyperphosphatemia.  Subjective: Seen and examined at bedside.  Tolerated HD yesterday, net uf 3L. Dizziness is better but feels like his legs are weak this morning when trying to walk around but thinks it's because he has been in bed since yesterday and has not moved around much yet.  Objective Vital signs in last 24 hours: Vitals:   03/07/21 1829 03/07/21 1957 03/07/21 2239 03/08/21 0523  BP:  (!) 171/98 (!) 193/89 (!) 189/96  Pulse: 91 88 91 86  Resp: (!) 22 (!) '22 20 20  '$ Temp:  98.2 F (36.8 C) 97.9 F (36.6 C) 97.8 F (36.6 C)  TempSrc:  Oral Oral Oral  SpO2: 97% 96% 96% 95%  Weight:      Height:       Weight change:   Intake/Output Summary (Last 24 hours) at 03/08/2021 0717 Last data filed at 03/07/2021 1147 Gross per 24 hour  Intake --  Output 3000 ml  Net -3000 ml       Labs: Basic Metabolic Panel: Recent Labs  Lab 03/06/21 0318 03/07/21 0327 03/08/21 0034  NA 132* 131* 130*  K 5.2* 5.8* 4.7  CL 95* 96* 92*  CO2 '23 22 27  '$ GLUCOSE 241* 183* 229*  BUN 65* 86* 56*  CREATININE 13.05* 15.03* 9.95*  CALCIUM 8.4* 8.6* 8.3*  PHOS 6.7* 8.0* 6.5*   Liver Function Tests: Recent Labs  Lab 03/06/21 0318 03/07/21 0327 03/08/21 0034  ALBUMIN 2.3* 2.2* 2.2*   No results for input(s): LIPASE, AMYLASE in the last 168 hours. No results for input(s): AMMONIA in the last 168  hours. CBC: Recent Labs  Lab 03/02/21 0111 03/02/21 1142 03/03/21 0306 03/06/21 0318 03/07/21 0800  WBC 6.2 7.2 5.4 6.0 6.4  HGB 9.0* 7.9* 8.5* 7.2* 7.1*  HCT 27.1* 24.6* 26.1* 22.5* 22.2*  MCV 82.4 83.4 83.1 85.6 85.7  PLT 221 205 172 219 246   Cardiac Enzymes: No results for input(s): CKTOTAL, CKMB, CKMBINDEX, TROPONINI in the last 168 hours.  CBG: Recent Labs  Lab 03/06/21 2118 03/07/21 0807 03/07/21 1227 03/07/21 1702 03/07/21 2120  GLUCAP 167* 188* 158* 263* 201*    Iron Studies:  No results for input(s): IRON, TIBC, TRANSFERRIN, FERRITIN in the last 72 hours.  Studies/Results: No results found.  Medications: Infusions:     Scheduled Medications:  amLODipine  10 mg Oral Daily   calcium acetate  1,334 mg Oral TID WC   carvedilol  25 mg Oral BID WC   Chlorhexidine Gluconate Cloth  6 each Topical Q0600   Chlorhexidine Gluconate Cloth  6 each Topical Q0600   Chlorhexidine Gluconate Cloth  6 each Topical Q0600   Chlorhexidine Gluconate Cloth  6 each Topical Q0600   Chlorhexidine Gluconate Cloth  6 each Topical Q0600   cloNIDine  0.3 mg Oral TID   darbepoetin (ARANESP) injection - DIALYSIS  60 mcg Intravenous Q Tue-HD   feeding supplement (NEPRO CARB STEADY)  237 mL Oral BID BM   heparin injection (subcutaneous)  5,000 Units Subcutaneous Q8H   insulin aspart  0-6 Units Subcutaneous TID WC   isosorbide mononitrate  30 mg Oral Daily   kidney failure book   Does not apply Once   multivitamin  1 tablet Oral QHS   polyethylene glycol  17 g Oral Daily   Thrombi-Pad  4 each Topical Once    have reviewed scheduled and prn medications.  Physical Exam: General:NAD, comfortable Heart:RRR, s1s2 nl Lungs: Clear b/l, no crackle Abdomen:soft, Non-tender, non-distended Extremities: No LE edema Neuro: awake, alert, moves all ext spontaneously, speech clear and coherent Dialysis Access: Right IJ TDC, left AV fistula site clean, without bleeding, has good thrill and  bruit.  Maryetta Shafer 03/08/2021,7:17 AM  LOS: 8 days

## 2021-03-08 NOTE — Progress Notes (Signed)
Informed by RN a few minutes ago that patient complaining of some clumsiness/weakness in his left leg.  Evaluated patient at bedside-he apparently noticed difficulty ambulating after dialysis yesterday.  This morning when I saw the patient-we talked about outpatient HD arrangement-but he did not let me know about his left leg weakness.  Persistent at bedside-he has been mostly lying-and not moving around.  On exam: Speech clear No facial droop Motor strength-in lower extremities appears symmetrical-almost 5/5. Gait: He does appear to have almost a limp on the left  At this point-not sure whether this is due to neuropraxia/nerve compression or from a small CVA.  Spoke with neurologist-Dr. Kirkpatrick-recommends proceeding with CT head-if negative-then with a MRI brain.  If imaging positive for CVA-we will formally consult neurology.  Patient/sister updated at bedside regarding above plan.

## 2021-03-08 NOTE — Plan of Care (Signed)

## 2021-03-08 NOTE — Progress Notes (Signed)
Informed that he has a chair for HD at [facility], TTS chair. Insurance pending. Will transition his schedule to TTS while he's here. Next HD tentatively planned for 7/7  Gean Quint, MD [facility]

## 2021-03-08 NOTE — Plan of Care (Signed)
  Problem: Education: Goal: Knowledge of General Education information will improve Description: Including pain rating scale, medication(s)/side effects and non-pharmacologic comfort measures Outcome: Progressing   Problem: Pain Managment: Goal: General experience of comfort will improve Outcome: Progressing   Problem: Skin Integrity: Goal: Risk for impaired skin integrity will decrease Outcome: Progressing   

## 2021-03-08 NOTE — Progress Notes (Signed)
Nutrition Follow-up  DOCUMENTATION CODES:  Obesity unspecified  INTERVENTION:  Continue renal diet.  Continue Nepro shakes BID.  Continue Rena-Vit daily.  Continue to reinforce renal diet education as needed.  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 - ongoing  GOAL:  Patient will meet greater than or equal to 90% of their needs - meeting  MONITOR:  PO intake, Supplement acceptance, Labs, Weight trends, I & O's  REASON FOR ASSESSMENT:  Consult Diet education  ASSESSMENT:  Pt with a PMH significant for DM1, CKD, HTN, and tobacco abuse admitted with acute on chronic renal failure.  Pt reports eating well. He reports really liking the Nepro shakes (either vanilla or berry). Pt requested one during RD visit. Per Epic, pt meals have not been documented since 6/27.  Admit wt: 129.5 kg Current wt: 134.4 kg  Asked pt questions regarding renal diet. Pt able to perform teach back.  Continue Nepro shakes BID and Rena-Vit.  Medications: reviewed; PhosLo TID, Nepro shakes BID, SSI, Rena-Vit, miralax  Labs: reviewed; Na 130 (L), CBG 158-263 (H)  NUTRITION - FOCUSED PHYSICAL EXAM: Flowsheet Row Most Recent Value  Orbital Region No depletion  Upper Arm Region No depletion  Thoracic and Lumbar Region No depletion  Buccal Region No depletion  Temple Region No depletion  Clavicle Bone Region No depletion  Clavicle and Acromion Bone Region No depletion  Scapular Bone Region No depletion  Dorsal Hand No depletion  Patellar Region No depletion  Anterior Thigh Region No depletion  Posterior Calf Region No depletion  Edema (RD Assessment) Mild  [Generalized]  Hair Reviewed  Eyes Reviewed  Mouth Reviewed  Skin Reviewed  Nails Reviewed   Diet Order:   Diet Order             Diet renal/carb modified with fluid restriction Diet-HS Snack? Nothing; Fluid restriction: 1200 mL Fluid; Room  service appropriate? Yes; Fluid consistency: Thin  Diet effective now                  EDUCATION NEEDS:  Education needs have been addressed  Skin:  Skin Assessment: Reviewed RN Assessment  Last BM:  03/05/21  Height:  Ht Readings from Last 1 Encounters:  02/27/21 '6\' 5"'$  (1.956 m)   Weight:  Wt Readings from Last 1 Encounters:  03/07/21 134.4 kg   Ideal Body Weight:  94.55 kg  BMI:  Body mass index is 35.14 kg/m.  Estimated Nutritional Needs:  Kcal:  3000-3200 Protein:  160-190g Fluid:  1L+UOP  Derrel Nip, RD, LDN (she/her/hers) Registered Dietitian I After-Hours/Weekend Pager # in [place]

## 2021-03-08 NOTE — Progress Notes (Addendum)
PROGRESS NOTE        PATIENT DETAILS Name: Bobby [name] Age: 25 y.o. Sex: male Date of Birth: [birth year] Admit Date: 02/27/2021 Admitting Physician Evalee Mutton Kristeen Mans, MD PCP:Pcp, No  Brief Narrative: Patient is a 25 y.o. male with PMHx DM-1, HTN, CKD stage IIIb-presented with nausea/vomiting/weakness/decreased appetite-found to have progressive CKD with worsening renal failure.  Significant events: 6/26>> admit-nausea/vomiting/weakness-progressive CKD with worsening renal failure. 6/27>>started on HD  Significant studies: 6/26>> CT abdomen/pelvis: No acute intra-abdominal process-no hydronephrosis. 6/27>> A1c: 8.2 6/27>> HIV: Nonreactive 6/27>> ANA: Positive 6/27>> dsDNA:negative 6/27>> SSA antibody, SSB antibody: Negative 6/27>> anticentromere, anti-Jo-1: Negative 6/27>> c-ANCA/p-ANCA titers: Negative 6/27>> hepatitis C antibody: Nonreactive 6/27>> HBsAg: Nonreactive 6/27>> glomerular basement membrane antibody: Pending 6/27>> SPEP: No M spike 6/28>> UA:> 300 protein,+RBC  Antimicrobial therapy: None  Microbiology data: 6/26>> COVID PCR: Negative  Procedures : 6/27>> TDC placed by IR. [id number] left basilic vein transposition fistula by vascular surgery  Consults: Nephrology, IR, vascular surgery  DVT Prophylaxis : heparin injection 5,000 Units Start: 03/05/21 1400 Place and maintain sequential compression device Start: 03/03/21 0910   Subjective: Lying comfortably in bed-no major issues overnight.  Assessment/Plan: Progressive renal failure/AKI superimposed on CKD stage IIIb-now likely ESRD: Suspected diabetic nephropathy-Per nephrology-this is likely ESRD-and further work-up/biopsy will not change outcome or management.  Uremic symptoms have significantly improved with initiation of HD-nephrology following and directing HD care.  Will need to await outpatient dialysis arrangement before consideration of discharge.   Hypertensive  urgency: BP remains elevated but better within the past few days-continue current dosing of clonidine, amlodipine, Coreg and Imdur.  Nephrology following-and adjusting antihypertensives.   Nausea/vomiting: Has resolved.  Likely due to uremia-no significant abnormality seen on CT abdomen.  Normocytic anemia: Due to CKD-on Aranesp-watch closely and transfuse if significant drop in hemoglobin.  Nontraumatic rhabdomyolysis: CK downtrending-likely clinically insignificant.  Mildly prolonged QTC: Resolved   DM-1: CBG stable with SSI-follow and adjust.  Recent Labs    03/07/21 2120 03/08/21 0754 03/08/21 1153  GLUCAP 201* 183* 212*      Obesity: Estimated body mass index is 35.14 kg/m as calculated from the following:   Height as of this encounter: '6\' 5"'$  (1.956 m).   Weight as of this encounter: 134.4 kg.   Diet: Diet Order             Diet renal/carb modified with fluid restriction Diet-HS Snack? Nothing; Fluid restriction: 1200 mL Fluid; Room service appropriate? Yes; Fluid consistency: Thin  Diet effective now                    Code Status: Full code   Family Communication: Sister at bedside on 6/28  Disposition Plan: Status is: Inpatient  The patient will require care spanning > 2 midnights and should be moved to inpatient because: Inpatient level of care appropriate due to severity of illness  Dispo: The patient is from: Home              Anticipated d/c is to: Home              Patient currently is not medically stable to d/c.   Difficult to place patient No   Barriers to Discharge: Awaiting outpatient HD chair prior to discharge.  Antimicrobial agents: Anti-infectives (From admission, onward)    Start     Dose/Rate Route Frequency Ordered  Stop   03/04/21 0600  cefUROXime (ZINACEF) 1.5 g in sodium chloride 0.9 % 100 mL IVPB        1.5 g 200 mL/hr over 30 Minutes Intravenous On call to O.R. 03/03/21 1018 03/04/21 1145   02/28/21 1030  ceFAZolin (ANCEF)  IVPB 2g/100 mL premix        2 g 200 mL/hr over 30 Minutes Intravenous To Radiology 02/28/21 0943 02/28/21 1547        Time spent: 15 minutes-Greater than 50% of this time was spent in counseling, explanation of diagnosis, planning of further management, and coordination of care.  MEDICATIONS: Scheduled Meds:  amLODipine  10 mg Oral Daily   calcium acetate  1,334 mg Oral TID WC   carvedilol  25 mg Oral BID WC   Chlorhexidine Gluconate Cloth  6 each Topical Q0600   cloNIDine  0.3 mg Oral TID   [START ON 03/09/2021] darbepoetin (ARANESP) injection - DIALYSIS  60 mcg Intravenous Q Wed-HD   feeding supplement (NEPRO CARB STEADY)  237 mL Oral BID BM   heparin injection (subcutaneous)  5,000 Units Subcutaneous Q8H   insulin aspart  0-6 Units Subcutaneous TID WC   isosorbide mononitrate  30 mg Oral Daily   kidney failure book   Does not apply Once   multivitamin  1 tablet Oral QHS   polyethylene glycol  17 g Oral Daily   Continuous Infusions:   PRN Meds:.acetaminophen **OR** acetaminophen, fentaNYL (SUBLIMAZE) injection, hydrALAZINE, labetalol, oxyCODONE, polyvinyl alcohol   PHYSICAL EXAM: Vital signs: Vitals:   03/07/21 2239 03/08/21 0523 03/08/21 0823 03/08/21 1200  BP: (!) 193/89 (!) 189/96 (!) 192/98 (!) 163/115  Pulse: 91 86 86 88  Resp: '20 20 17 17  '$ Temp: 97.9 F (36.6 C) 97.8 F (36.6 C) 98 F (36.7 C) 98.3 F (36.8 C)  TempSrc: Oral Oral Oral Oral  SpO2: 96% 95% 96% 98%  Weight:      Height:       Filed Weights   03/06/21 0610 03/07/21 0740 03/07/21 1147  Weight: (!) 138 kg (!) 137.4 kg 134.4 kg   Body mass index is 35.14 kg/m.   Gen Exam:Alert awake-not in any distress HEENT:atraumatic, normocephalic Chest: B/L clear to auscultation anteriorly CVS:S1S2 regular Abdomen:soft non tender, non distended Extremities:no edema Neurology: Non focal Skin: no rash   I have personally reviewed following labs and imaging studies  LABORATORY DATA: CBC: Recent  Labs  Lab 03/02/21 0111 03/02/21 1142 03/03/21 0306 03/06/21 0318 03/07/21 0800  WBC 6.2 7.2 5.4 6.0 6.4  HGB 9.0* 7.9* 8.5* 7.2* 7.1*  HCT 27.1* 24.6* 26.1* 22.5* 22.2*  MCV 82.4 83.4 83.1 85.6 85.7  PLT 221 205 172 219 246     Basic Metabolic Panel: Recent Labs  Lab 03/02/21 0111 03/03/21 0306 03/04/21 0113 03/05/21 0226 03/06/21 0318 03/07/21 0327 03/08/21 0034  NA 135   < > 135 133* 132* 131* 130*  K 4.2   < > 5.3* 4.5 5.2* 5.8* 4.7  CL 100   < > 100 96* 95* 96* 92*  CO2 24   < > '24 26 23 22 27  '$ GLUCOSE 134*   < > 185* 206* 241* 183* 229*  BUN 67*   < > 59* 40* 65* 86* 56*  CREATININE 16.95*   < > 14.31* 10.23* 13.05* 15.03* 9.95*  CALCIUM 7.6*   < > 8.1* 8.3* 8.4* 8.6* 8.3*  MG 1.7  --   --   --   --   --   --  PHOS 6.3*   < > 6.7* 4.3 6.7* 8.0* 6.5*   < > = values in this interval not displayed.     GFR: Estimated Creatinine Clearance: 17.1 mL/min (A) (by C-G formula based on SCr of 9.95 mg/dL (H)).  Liver Function Tests: Recent Labs  Lab 03/04/21 0113 03/05/21 0226 03/06/21 0318 03/07/21 0327 03/08/21 0034  ALBUMIN 2.3* 2.3* 2.3* 2.2* 2.2*    No results for input(s): LIPASE, AMYLASE in the last 168 hours.  No results for input(s): AMMONIA in the last 168 hours.  Coagulation Profile: No results for input(s): INR, PROTIME in the last 168 hours.  Cardiac Enzymes: No results for input(s): CKTOTAL, CKMB, CKMBINDEX, TROPONINI in the last 168 hours.   BNP (last 3 results) No results for input(s): PROBNP in the last 8760 hours.  Lipid Profile: No results for input(s): CHOL, HDL, LDLCALC, TRIG, CHOLHDL, LDLDIRECT in the last 72 hours.   Thyroid Function Tests: No results for input(s): TSH, T4TOTAL, FREET4, T3FREE, THYROIDAB in the last 72 hours.   Anemia Panel: No results for input(s): VITAMINB12, FOLATE, FERRITIN, TIBC, IRON, RETICCTPCT in the last 72 hours.   Urine analysis:    Component Value Date/Time   COLORURINE YELLOW 03/01/2021  [place] 03/01/2021 0847   LABSPEC 1.010 03/01/2021 0847   PHURINE 7.0 03/01/2021 0847   GLUCOSEU 150 (A) 03/01/2021 0847   HGBUR SMALL (A) 03/01/2021 0847   BILIRUBINUR NEGATIVE 03/01/2021 0847   KETONESUR 5 (A) 03/01/2021 0847   PROTEINUR >=300 (A) 03/01/2021 0847   NITRITE NEGATIVE 03/01/2021 0847   LEUKOCYTESUR NEGATIVE 03/01/2021 0847    Sepsis Labs: Lactic Acid, Venous No results found for: LATICACIDVEN  MICROBIOLOGY: Recent Results (from the past 240 hour(s))  SARS CORONAVIRUS 2 (TAT 6-24 HRS) Nasopharyngeal Nasopharyngeal Swab     Status: None   Collection Time: 02/27/21  7:42 PM   Specimen: Nasopharyngeal Swab  Result Value Ref Range Status   SARS Coronavirus 2 NEGATIVE NEGATIVE Final    Comment: (NOTE) SARS-CoV-2 target nucleic acids are NOT DETECTED.  The SARS-CoV-2 RNA is generally detectable in upper and lower respiratory specimens during the acute phase of infection. Negative results do not preclude SARS-CoV-2 infection, do not rule out co-infections with other pathogens, and should not be used as the sole basis for treatment or other patient management decisions. Negative results must be combined with clinical observations, patient history, and epidemiological information. The expected result is Negative.  Fact Sheet for Patients: SugarRoll.be  Fact Sheet for Healthcare Providers: https://www.woods-mathews.com/  This test is not yet approved or cleared by the [place] FDA and  has been authorized for detection and/or diagnosis of SARS-CoV-2 by FDA under an Emergency Use Authorization (EUA). This EUA will remain  in effect (meaning this test can be used) for the duration of the COVID-19 declaration under Se ction 564(b)(1) of the Act, 21 U.S.C. section 360bbb-3(b)(1), unless the authorization is terminated or revoked sooner.  Performed at [facility], [place] [address], [place],  [place] [address]     RADIOLOGY STUDIES/RESULTS: No results found.   LOS: 8 days   Oren Binet, MD  [facility]    To contact the attending provider between 7A-7P or the covering provider during after hours 7P-7A, please log into the web site [email or web address] and access using universal [place] password for that web site. If you do not have the password, please call the hospital operator.  03/08/2021, 2:19 PM

## 2021-03-09 ENCOUNTER — Inpatient Hospital Stay (HOSPITAL_COMMUNITY): Payer: Medicaid Other

## 2021-03-09 LAB — RENAL FUNCTION PANEL
Albumin: 2.4 g/dL — ABNORMAL LOW (ref 3.5–5.0)
Albumin: 2.5 g/dL — ABNORMAL LOW (ref 3.5–5.0)
Anion gap: 12 (ref 5–15)
Anion gap: 14 (ref 5–15)
BUN: 71 mg/dL — ABNORMAL HIGH (ref 6–20)
BUN: 78 mg/dL — ABNORMAL HIGH (ref 6–20)
CO2: 27 mmol/L (ref 22–32)
CO2: 25 mmol/L (ref 22–32)
Calcium: 8.9 mg/dL (ref 8.9–10.3)
Calcium: 9 mg/dL (ref 8.9–10.3)
Chloride: 92 mmol/L — ABNORMAL LOW (ref 98–111)
Chloride: 90 mmol/L — ABNORMAL LOW (ref 98–111)
Creatinine, Ser: 11.93 mg/dL — ABNORMAL HIGH (ref 0.61–1.24)
Creatinine, Ser: 12.69 mg/dL — ABNORMAL HIGH (ref 0.61–1.24)
GFR, Estimated: 5 mL/min — ABNORMAL LOW (ref >60)
GFR, Estimated: 5 mL/min — ABNORMAL LOW (ref >60)
Glucose, Bld: 210 mg/dL — ABNORMAL HIGH (ref 70–99)
Glucose, Bld: 170 mg/dL — ABNORMAL HIGH (ref 70–99)
Phosphorus: 7.2 mg/dL — ABNORMAL HIGH (ref 2.5–4.6)
Phosphorus: 7.4 mg/dL — ABNORMAL HIGH (ref 2.5–4.6)
Potassium: 4.6 mmol/L (ref 3.5–5.1)
Potassium: 4.7 mmol/L (ref 3.5–5.1)
Sodium: 131 mmol/L — ABNORMAL LOW (ref 135–145)
Sodium: 129 mmol/L — ABNORMAL LOW (ref 135–145)

## 2021-03-09 LAB — CBC
HCT: 22.8 % — ABNORMAL LOW (ref 39.0–52.0)
Hemoglobin: 7.2 g/dL — ABNORMAL LOW (ref 13.0–17.0)
MCH: 26.5 pg (ref 26.0–34.0)
MCHC: 31.6 g/dL (ref 30.0–36.0)
MCV: 83.8 fL (ref 80.0–100.0)
Platelets: 288 10*3/uL (ref 150–400)
RBC: 2.72 MIL/uL — ABNORMAL LOW (ref 4.22–5.81)
RDW: 12.2 % (ref 11.5–15.5)
WBC: 4.7 10*3/uL (ref 4.0–10.5)
nRBC: 0 % (ref 0.0–0.2)

## 2021-03-09 LAB — GLUCOSE, CAPILLARY
Glucose-Capillary: 175 mg/dL — ABNORMAL HIGH (ref 70–99)
Glucose-Capillary: 197 mg/dL — ABNORMAL HIGH (ref 70–99)
Glucose-Capillary: 147 mg/dL — ABNORMAL HIGH (ref 70–99)
Glucose-Capillary: 165 mg/dL — ABNORMAL HIGH (ref 70–99)

## 2021-03-09 MED ORDER — LIDOCAINE-PRILOCAINE 2.5-2.5 % EX CREA
1.0000 "application " | TOPICAL_CREAM | CUTANEOUS | Status: DC | PRN
  Filled 2021-03-09: qty 5

## 2021-03-09 MED ORDER — SENNOSIDES-DOCUSATE SODIUM 8.6-50 MG PO TABS
2.0000 | ORAL_TABLET | Freq: Every day | ORAL | Status: DC
  Administered 2021-03-09 – 2021-03-11 (×3): 2 via ORAL
  Filled 2021-03-09 (×4): qty 2

## 2021-03-09 MED ORDER — DARBEPOETIN ALFA 60 MCG/0.3ML IJ SOSY: 60.0000 ug | PREFILLED_SYRINGE | INTRAMUSCULAR | Status: DC

## 2021-03-09 MED ORDER — SODIUM CHLORIDE 0.9 % IV SOLN
125.0000 mg | INTRAVENOUS | Status: DC
  Administered 2021-03-10 – 2021-03-12 (×2): 125 mg via INTRAVENOUS
  Filled 2021-03-09 (×2): qty 10

## 2021-03-09 NOTE — Plan of Care (Signed)
  Problem: Safety: Goal: Ability to remain free from injury will improve Outcome: Progressing   Problem: Skin Integrity: Goal: Risk for impaired skin integrity will decrease Outcome: Progressing   Problem: Elimination: Goal: Will not experience complications related to bowel motility Outcome: Not Progressing  Pt constipated

## 2021-03-09 NOTE — Progress Notes (Signed)
Renal Navigator continues to await financial clearance from [employer] financial team for uninsured patient in order for him to be able to be discharged and start in the outpatient HD clinic. He has been medically cleared and will have a TTS seat at the [facility] unit. Navigator will continue to follow closely.   Alphonzo Cruise, [place] Renal Navigator [phone]

## 2021-03-09 NOTE — Progress Notes (Addendum)
PROGRESS NOTE        PATIENT DETAILS Name: Bobby [name] Age: 25 y.o. Sex: male Date of Birth: [birth year] Admit Date: 02/27/2021 Admitting Physician Evalee Mutton Kristeen Mans, MD PCP:Pcp, No  Brief Narrative: Patient is a 25 y.o. male with PMHx DM-1, HTN, CKD stage IIIb-presented with nausea/vomiting/weakness/decreased appetite-found to have progressive CKD with worsening renal failure.  Significant events: 6/26>> admit-nausea/vomiting/weakness-progressive CKD with worsening renal failure. 6/27>>started on HD  Significant studies: 6/26>> CT abdomen/pelvis: No acute intra-abdominal process-no hydronephrosis. 6/27>> A1c: 8.2 6/27>> HIV: Nonreactive 6/27>> ANA: Positive 6/27>> dsDNA:negative 6/27>> SSA antibody, SSB antibody: Negative 6/27>> anticentromere, anti-Jo-1: Negative 6/27>> c-ANCA/p-ANCA titers: Negative 6/27>> hepatitis C antibody: Nonreactive 6/27>> HBsAg: Nonreactive 6/27>> glomerular basement membrane antibody:neg 6/27>> SPEP: No M spike 6/28>> UA:> 300 protein,+RBC  Antimicrobial therapy: None  Microbiology data: 6/26>> COVID PCR: Negative  Procedures : 6/27>> TDC placed by IR. [id number] left basilic vein transposition fistula by vascular surgery  Consults: Nephrology, IR, vascular surgery  DVT Prophylaxis : heparin injection 5,000 Units Start: 03/05/21 1400 Place and maintain sequential compression device Start: 03/03/21 0910   Subjective: Left leg weakness that he developed yesterday-significantly better per patient.  He thinks that it was due to nerve compression as he was not getting out of bed.  He went down to get a MRI of his brain-but was claustrophobic and refused it.    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  Uremic  symptoms are completely resolved after initiation of HD.  Awaiting outpatient HD placement.  Hypertensive urgency: BP overall better controlled but still on the higher side-continue amlodipine, Imdur, clonidine and Coreg.  Nephrology plans to increase UF goals for tomorrow.  Follow and adjust accordingly.  Nausea/vomiting: Has resolved.  Likely due to uremia-no significant abnormality seen on CT abdomen.  Normocytic anemia: Due to CKD-on Aranesp-watch closely and transfuse if significant drop in hemoglobin.  Hyponatremia: Should improve with HD which is scheduled for tomorrow.  Follow periodically.  Nontraumatic rhabdomyolysis: CK downtrending-likely clinically insignificant.  Mildly prolonged QTC: Resolved   DM-1: CBG stable with SSI-follow and adjust.  CBG (last 3)  Recent Labs    03/08/21 2115 03/09/21 0808 03/09/21 1157  GLUCAP 211* 175* 197*     Left leg weakness: Developed on 7/5-per patient-this is significantly better as of today-he could not undergo a MRI brain due to claustrophobia.  CT head was negative.  Due to rapid improvement-suspect that it was mostly from nerve compression/neuropraxia rather than CVA.  At patient's request-we will not proceed with any further work-up as he feels that he is back to his baseline.  Obesity: Estimated body mass index is 35.14 kg/m as calculated from the following:   Height as of this encounter: '6\' 5"'$  (1.956 m).   Weight as of this encounter: 134.4 kg.   Diet: Diet Order             Diet renal/carb modified with fluid restriction Diet-HS Snack? Nothing; Fluid restriction: 1200 mL Fluid; Room service appropriate? Yes; Fluid consistency: Thin  Diet effective now                    Code Status: Full code   Family Communication: Sister at bedside on 6/28  Disposition  Plan: Status is: Inpatient  The patient will require care spanning > 2 midnights and should be moved to inpatient because: Inpatient level of care  appropriate due to severity of illness  Dispo: The patient is from: Home              Anticipated d/c is to: Home              Patient currently is not medically stable to d/c.   Difficult to place patient No   Barriers to Discharge: Awaiting outpatient HD chair prior to discharge.  Antimicrobial agents: Anti-infectives (From admission, onward)    Start     Dose/Rate Route Frequency Ordered Stop   03/04/21 0600  cefUROXime (ZINACEF) 1.5 g in sodium chloride 0.9 % 100 mL IVPB        1.5 g 200 mL/hr over 30 Minutes Intravenous On call to O.R. 03/03/21 1018 03/04/21 1145   02/28/21 1030  ceFAZolin (ANCEF) IVPB 2g/100 mL premix        2 g 200 mL/hr over 30 Minutes Intravenous To Radiology 02/28/21 0943 02/28/21 1547        Time spent: 15 minutes-Greater than 50% of this time was spent in counseling, explanation of diagnosis, planning of further management, and coordination of care.  MEDICATIONS: Scheduled Meds:  amLODipine  10 mg Oral Daily   calcium acetate  1,334 mg Oral TID WC   carvedilol  25 mg Oral BID WC   Chlorhexidine Gluconate Cloth  6 each Topical Q0600   cloNIDine  0.3 mg Oral TID   [START ON 03/10/2021] darbepoetin (ARANESP) injection - DIALYSIS  60 mcg Intravenous Q Thu-HD   feeding supplement (NEPRO CARB STEADY)  237 mL Oral BID BM   heparin injection (subcutaneous)  5,000 Units Subcutaneous Q8H   insulin aspart  0-6 Units Subcutaneous TID WC   isosorbide mononitrate  30 mg Oral Daily   kidney failure book   Does not apply Once   multivitamin  1 tablet Oral QHS   polyethylene glycol  17 g Oral Daily   senna-docusate  2 tablet Oral QHS   Continuous Infusions:  [START ON 03/10/2021] ferric gluconate (FERRLECIT) IVPB      PRN Meds:.acetaminophen **OR** acetaminophen, fentaNYL (SUBLIMAZE) injection, hydrALAZINE, labetalol, lidocaine-prilocaine, oxyCODONE, polyvinyl alcohol   PHYSICAL EXAM: Vital signs: Vitals:   03/09/21 0800 03/09/21 0900 03/09/21 1159  03/09/21 1200  BP:   (!) 157/83   Pulse:   83   Resp: 19 (!) 22 13 (!) 21  Temp:   98.6 F (37 C)   TempSrc:   Oral   SpO2:   100%   Weight:      Height:       Filed Weights   03/06/21 0610 03/07/21 0740 03/07/21 1147  Weight: (!) 138 kg (!) 137.4 kg 134.4 kg   Body mass index is 35.14 kg/m.   Gen Exam:Alert awake-not in any distress HEENT:atraumatic, normocephalic Chest: B/L clear to auscultation anteriorly CVS:S1S2 regular Abdomen:soft non tender, non distended Extremities:no edema Neurology: Non focal-5/5 in bilateral lower extremities. Skin: no rash   I have personally reviewed following labs and imaging studies  LABORATORY DATA: CBC: Recent Labs  Lab 03/03/21 0306 03/06/21 0318 03/07/21 0800 03/09/21 1001  WBC 5.4 6.0 6.4 4.7  HGB 8.5* 7.2* 7.1* 7.2*  HCT 26.1* 22.5* 22.2* 22.8*  MCV 83.1 85.6 85.7 83.8  PLT 172 219 246 288     Basic Metabolic Panel: Recent Labs  Lab 03/06/21 0318 03/07/21  0327 03/08/21 0034 03/09/21 0034 03/09/21 1001  NA 132* 131* 130* 131* 129*  K 5.2* 5.8* 4.7 4.6 4.7  CL 95* 96* 92* 92* 90*  CO2 '23 22 27 27 25  '$ GLUCOSE 241* 183* 229* 210* 170*  BUN 65* 86* 56* 71* 78*  CREATININE 13.05* 15.03* 9.95* 11.93* 12.69*  CALCIUM 8.4* 8.6* 8.3* 8.9 9.0  PHOS 6.7* 8.0* 6.5* 7.2* 7.4*     GFR: Estimated Creatinine Clearance: 13.4 mL/min (A) (by C-G formula based on SCr of 12.69 mg/dL (H)).  Liver Function Tests: Recent Labs  Lab 03/06/21 0318 03/07/21 0327 03/08/21 0034 03/09/21 0034 03/09/21 1001  ALBUMIN 2.3* 2.2* 2.2* 2.4* 2.5*    No results for input(s): LIPASE, AMYLASE in the last 168 hours.  No results for input(s): AMMONIA in the last 168 hours.  Coagulation Profile: No results for input(s): INR, PROTIME in the last 168 hours.  Cardiac Enzymes: No results for input(s): CKTOTAL, CKMB, CKMBINDEX, TROPONINI in the last 168 hours.   BNP (last 3 results) No results for input(s): PROBNP in the last 8760  hours.  Lipid Profile: No results for input(s): CHOL, HDL, LDLCALC, TRIG, CHOLHDL, LDLDIRECT in the last 72 hours.   Thyroid Function Tests: No results for input(s): TSH, T4TOTAL, FREET4, T3FREE, THYROIDAB in the last 72 hours.   Anemia Panel: No results for input(s): VITAMINB12, FOLATE, FERRITIN, TIBC, IRON, RETICCTPCT in the last 72 hours.   Urine analysis:    Component Value Date/Time   COLORURINE YELLOW 03/01/2021 [place] 03/01/2021 0847   LABSPEC 1.010 03/01/2021 0847   PHURINE 7.0 03/01/2021 0847   GLUCOSEU 150 (A) 03/01/2021 0847   HGBUR SMALL (A) 03/01/2021 0847   BILIRUBINUR NEGATIVE 03/01/2021 0847   KETONESUR 5 (A) 03/01/2021 0847   PROTEINUR >=300 (A) 03/01/2021 0847   NITRITE NEGATIVE 03/01/2021 0847   LEUKOCYTESUR NEGATIVE 03/01/2021 0847    Sepsis Labs: Lactic Acid, Venous No results found for: LATICACIDVEN  MICROBIOLOGY: Recent Results (from the past 240 hour(s))  SARS CORONAVIRUS 2 (TAT 6-24 HRS) Nasopharyngeal Nasopharyngeal Swab     Status: None   Collection Time: 02/27/21  7:42 PM   Specimen: Nasopharyngeal Swab  Result Value Ref Range Status   SARS Coronavirus 2 NEGATIVE NEGATIVE Final    Comment: (NOTE) SARS-CoV-2 target nucleic acids are NOT DETECTED.  The SARS-CoV-2 RNA is generally detectable in upper and lower respiratory specimens during the acute phase of infection. Negative results do not preclude SARS-CoV-2 infection, do not rule out co-infections with other pathogens, and should not be used as the sole basis for treatment or other patient management decisions. Negative results must be combined with clinical observations, patient history, and epidemiological information. The expected result is Negative.  Fact Sheet for Patients: SugarRoll.be  Fact Sheet for Healthcare Providers: https://www.woods-mathews.com/  This test is not yet approved or cleared by the [place]  FDA and  has been authorized for detection and/or diagnosis of SARS-CoV-2 by FDA under an Emergency Use Authorization (EUA). This EUA will remain  in effect (meaning this test can be used) for the duration of the COVID-19 declaration under Se ction 564(b)(1) of the Act, 21 U.S.C. section 360bbb-3(b)(1), unless the authorization is terminated or revoked sooner.  Performed at [facility], [place] [address], [place], [place] [address]     RADIOLOGY STUDIES/RESULTS: CT HEAD WO CONTRAST  Result Date: 03/08/2021 CLINICAL DATA:  Right leg weakness. EXAM: CT HEAD WITHOUT CONTRAST TECHNIQUE: Contiguous axial images were obtained  from the base of the skull through the vertex without intravenous contrast. COMPARISON:  CT head dated June 05, 2019. FINDINGS: Brain: No evidence of acute infarction, hemorrhage, hydrocephalus, extra-axial collection or mass lesion/mass effect. Vascular: No hyperdense vessel or unexpected calcification. Skull: Normal. Negative for fracture or focal lesion. Sinuses/Orbits: No acute finding. Other: None. IMPRESSION: 1. Normal noncontrast head CT. Electronically Signed   By: Titus Dubin M.D.   On: 03/08/2021 17:27     LOS: 9 days   Oren Binet, MD  [facility]    To contact the attending provider between 7A-7P or the covering provider during after hours 7P-7A, please log into the web site [email or web address] and access using universal [place] password for that web site. If you do not have the password, please call the hospital operator.  03/09/2021, 2:33 PM

## 2021-03-09 NOTE — Plan of Care (Addendum)
Potential discharge to home today pending HD plan. Patient declined MRI (after receiving ordered pre-meds) and MD made aware.    Problem: Education: Goal: Knowledge of General Education information will improve Description: Including pain rating scale, medication(s)/side effects and non-pharmacologic comfort measures Outcome: Progressing   Problem: Health Behavior/Discharge Planning: Goal: Ability to manage health-related needs will improve Outcome: Progressing   Problem: Clinical Measurements: Goal: Ability to maintain clinical measurements within normal limits will improve Outcome: Progressing Goal: Will remain free from infection Outcome: Progressing Goal: Diagnostic test results will improve Outcome: Progressing Goal: Cardiovascular complication will be avoided Outcome: Progressing   Problem: Activity: Goal: Risk for activity intolerance will decrease Outcome: Progressing   Problem: Nutrition: Goal: Adequate nutrition will be maintained Outcome: Progressing   Problem: Coping: Goal: Level of anxiety will decrease Outcome: Progressing   Problem: Elimination: Goal: Will not experience complications related to bowel motility Outcome: Progressing Goal: Will not experience complications related to urinary retention Outcome: Progressing   Problem: Pain Managment: Goal: General experience of comfort will improve Outcome: Progressing   Problem: Safety: Goal: Ability to remain free from injury will improve Outcome: Progressing   Problem: Skin Integrity: Goal: Risk for impaired skin integrity will decrease Outcome: Progressing

## 2021-03-09 NOTE — Progress Notes (Signed)
[facility] NEPHROLOGY PROGRESS NOTE  Assessment/ Plan: Pt is a 25 y.o. yo male  with history of type 1 diabetes since very young age, retinopathy, uncontrolled hypertension on metoprolol, CKD 3 and was followed by nephrologist in [facility] with creatinine level 2.10 in 09/2019, presented with severe nausea, vomiting, decreased appetite and weakness for about 6 weeks, seen as a consultation for the management of advanced renal failure. SBP >200 and creatinine level around 26.  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  The creatinine level was around 2 back in 2021 and was seen by nephrologist in [facility].  He has uncontrolled diabetes and hypertension with poor follow-up.  Now he presents with around 6 weeks of uremic symptoms with creatinine level peaked at 26.  The CT scan ruled out hydronephrosis/obstruction. Urinalysis with proteinuria UPC 3.7 and microscopic hematuria.  Hep B, hep C, HIV, ANCA, SPEP negative.  ANA positive with unremarkable complements and antidsDNA negative.  Follow-up anti-GBM. 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 I am not sure how much benefit he will get with kidney biopsy at this time, he probably has a lot of chronic finding/scarring and I believe that there will not be much change in his current management. Offered biopsy to [place] and at this time he would like to hold off on any further procedures especially given that his treatment would still likely stay the same. Of note, he has had nephrotic range proteinuria even back in 2020 (UPC 4.7g, result in careeverywhere) hence expecting to see chronic changes -The AV fistula was placed by Dr. Donnetta Hutching on 7/1 and has good thrill and bruit. -clip for outpatient hd placement: t[facility], awaiting insurance -Next HD tomorrow, will keep on TTS schedule  Hypertensive urgency with renal  failure:  -on amlodipine, imdur, clondine, coreg -BP still above goal, will increase UF goals for tomorrow  Metabolic acidosis with CKD: resolved. Now managed with dialysis.    Anemia of chronic kidney disease: Iron saturation 39% with serum iron 92. On esa, iv iron resumed   Secondary hyperparathyroidism: PTH 259 at goal for CKD.  Started PhosLo for hyperphosphatemia.  Subjective: Seen and examined at bedside.  Had issues with leg weakness throughout the day. CTH neg, MRI pending. Patient reports that his symptoms resolved, he reports it was because he was in the bed more than expected. Since moving around more, he reports that the weakness in his legs were better. He reports that he was ambulating with ease last night. No other complaints. He is eager to go home.  Objective Vital signs in last 24 hours: Vitals:   03/09/21 0320 03/09/21 0639 03/09/21 0700 03/09/21 0800  BP: (!) 168/85 (!) 185/104    Pulse: 85     Resp: '14  20 19  '$ Temp: 97.7 F (36.5 C)     TempSrc: Oral     SpO2: 94%     Weight:      Height:       Weight change:   Intake/Output Summary (Last 24 hours) at 03/09/2021 0816 Last data filed at 03/08/2021 1521 Gross per 24 hour  Intake 120 ml  Output --  Net 120 ml       Labs: Basic Metabolic Panel: Recent Labs  Lab 03/07/21 0327 03/08/21 0034 03/09/21 0034  NA 131* 130* 131*  K 5.8* 4.7 4.6  CL 96* 92* 92*  CO2  $'22 27 27  'm$ GLUCOSE 183* 229* 210*  BUN 86* 56* 71*  CREATININE 15.03* 9.95* 11.93*  CALCIUM 8.6* 8.3* 8.9  PHOS 8.0* 6.5* 7.2*   Liver Function Tests: Recent Labs  Lab 03/07/21 0327 03/08/21 0034 03/09/21 0034  ALBUMIN 2.2* 2.2* 2.4*   No results for input(s): LIPASE, AMYLASE in the last 168 hours. No results for input(s): AMMONIA in the last 168 hours. CBC: Recent Labs  Lab 03/02/21 1142 03/03/21 0306 03/06/21 0318 03/07/21 0800  WBC 7.2 5.4 6.0 6.4  HGB 7.9* 8.5* 7.2* 7.1*  HCT 24.6* 26.1* 22.5* 22.2*  MCV 83.4 83.1 85.6  85.7  PLT 205 172 219 246   Cardiac Enzymes: No results for input(s): CKTOTAL, CKMB, CKMBINDEX, TROPONINI in the last 168 hours.  CBG: Recent Labs  Lab 03/08/21 0754 03/08/21 1153 03/08/21 1627 03/08/21 2115 03/09/21 0808  GLUCAP 183* 212* 193* 211* 175*    Iron Studies:  No results for input(s): IRON, TIBC, TRANSFERRIN, FERRITIN in the last 72 hours.  Studies/Results: CT HEAD WO CONTRAST  Result Date: 03/08/2021 CLINICAL DATA:  Right leg weakness. EXAM: CT HEAD WITHOUT CONTRAST TECHNIQUE: Contiguous axial images were obtained from the base of the skull through the vertex without intravenous contrast. COMPARISON:  CT head dated June 05, 2019. FINDINGS: Brain: No evidence of acute infarction, hemorrhage, hydrocephalus, extra-axial collection or mass lesion/mass effect. Vascular: No hyperdense vessel or unexpected calcification. Skull: Normal. Negative for fracture or focal lesion. Sinuses/Orbits: No acute finding. Other: None. IMPRESSION: 1. Normal noncontrast head CT. Electronically Signed   By: Titus Dubin M.D.   On: 03/08/2021 17:27    Medications: Infusions:     Scheduled Medications:  amLODipine  10 mg Oral Daily   calcium acetate  1,334 mg Oral TID WC   carvedilol  25 mg Oral BID WC   Chlorhexidine Gluconate Cloth  6 each Topical Q0600   cloNIDine  0.3 mg Oral TID   darbepoetin (ARANESP) injection - DIALYSIS  60 mcg Intravenous Q Wed-HD   feeding supplement (NEPRO CARB STEADY)  237 mL Oral BID BM   heparin injection (subcutaneous)  5,000 Units Subcutaneous Q8H   insulin aspart  0-6 Units Subcutaneous TID WC   isosorbide mononitrate  30 mg Oral Daily   kidney failure book   Does not apply Once   multivitamin  1 tablet Oral QHS   polyethylene glycol  17 g Oral Daily   senna-docusate  2 tablet Oral QHS    have reviewed scheduled and prn medications.  Physical Exam: General:NAD, comfortable Heart:RRR, s1s2 nl Lungs: normal wob Abdomen:soft, Non-tender,  non-distended Extremities: No LE edema Neuro: awake, alert, moves all ext spontaneously, speech clear and coherent Dialysis Access: Right IJ TDC, left AV fistula site clean, without bleeding, has good thrill and bruit.  Zayd Bonet 03/09/2021,8:16 AM  LOS: 9 days

## 2021-03-09 NOTE — Progress Notes (Signed)
Inpatient Diabetes Program Recommendations  AACE/ADA: New Consensus Statement on Inpatient Glycemic Control (2015)  Target Ranges:  Prepandial:   less than 140 mg/dL      Peak postprandial:   less than 180 mg/dL (1-2 hours)      Critically ill patients:  140 - 180 mg/dL   Lab Results  Component Value Date   GLUCAP 175 (H) 03/09/2021   HGBA1C 8.2 (H) 02/28/2021    Review of Glycemic Control Results for DARREYL, CAZES (MRN [id number]) as of 03/09/2021 09:25  Ref. Range 03/08/2021 07:54 03/08/2021 11:53 03/08/2021 16:27 03/08/2021 21:15 03/09/2021 08:08  Glucose-Capillary Latest Ref Range: 70 - 99 mg/dL 183 (H) 212 (H) 193 (H) 211 (H) 175 (H)    Inpatient Diabetes Program Recommendations:    Might consider:  Novolog 2-3 units TID with meals if eats at least 50%  Will continue to follow while inpatient.  Thank you, Reche Dixon, RN, BSN Diabetes Coordinator Inpatient Diabetes Program [phone] (team pager from 8a-5p)

## 2021-03-09 NOTE — Plan of Care (Signed)
  Problem: Clinical Measurements: Goal: Cardiovascular complication will be avoided Outcome: Progressing   Problem: Activity: Goal: Risk for activity intolerance will decrease Outcome: Progressing   Problem: Nutrition: Goal: Adequate nutrition will be maintained Outcome: Progressing   

## 2021-03-09 NOTE — Progress Notes (Addendum)
CSW emailed Santa Lighter at [employer] counseling for update on [employer] application. Lurline Idol, MSW, LCSW 7/6/20229:09 AM   Response:   Kermit Balo Morning,  Joaine with [facility] attempted to screen the patient on 7/1 but was not able to because he was in surgery. She has a note that she will follow up with him again this week but no additional notes as of yet. Here is her contact information if you want to contact her for any updates: [email or web address]  [place]  Patient Accounting, Financial Navigator    Phone: [phone]  Fax: [phone]  Email: [email or web address]

## 2021-03-09 NOTE — Progress Notes (Signed)
Brought pt down for MRI, pt was given meds before exam, pt refused exam. Dr. Sloan Leiter and RN aware.

## 2021-03-10 LAB — CBC
HCT: 20.9 % — ABNORMAL LOW (ref 39.0–52.0)
Hemoglobin: 6.8 g/dL — CL (ref 13.0–17.0)
MCH: 27.3 pg (ref 26.0–34.0)
MCHC: 32.5 g/dL (ref 30.0–36.0)
MCV: 83.9 fL (ref 80.0–100.0)
Platelets: 286 10*3/uL (ref 150–400)
RBC: 2.49 MIL/uL — ABNORMAL LOW (ref 4.22–5.81)
RDW: 12.1 % (ref 11.5–15.5)
WBC: 4 10*3/uL (ref 4.0–10.5)
nRBC: 0 % (ref 0.0–0.2)

## 2021-03-10 LAB — RENAL FUNCTION PANEL
Albumin: 2.4 g/dL — ABNORMAL LOW (ref 3.5–5.0)
Anion gap: 14 (ref 5–15)
BUN: 92 mg/dL — ABNORMAL HIGH (ref 6–20)
CO2: 24 mmol/L (ref 22–32)
Calcium: 8.6 mg/dL — ABNORMAL LOW (ref 8.9–10.3)
Chloride: 92 mmol/L — ABNORMAL LOW (ref 98–111)
Creatinine, Ser: 14.08 mg/dL — ABNORMAL HIGH (ref 0.61–1.24)
GFR, Estimated: 4 mL/min — ABNORMAL LOW (ref >60)
Glucose, Bld: 172 mg/dL — ABNORMAL HIGH (ref 70–99)
Phosphorus: 7.8 mg/dL — ABNORMAL HIGH (ref 2.5–4.6)
Potassium: 5.3 mmol/L — ABNORMAL HIGH (ref 3.5–5.1)
Sodium: 130 mmol/L — ABNORMAL LOW (ref 135–145)

## 2021-03-10 LAB — PREPARE RBC (CROSSMATCH)

## 2021-03-10 LAB — ABO/RH: ABO/RH(D): B POS

## 2021-03-10 LAB — GLUCOSE, CAPILLARY
Glucose-Capillary: 162 mg/dL — ABNORMAL HIGH (ref 70–99)
Glucose-Capillary: 182 mg/dL — ABNORMAL HIGH (ref 70–99)
Glucose-Capillary: 221 mg/dL — ABNORMAL HIGH (ref 70–99)

## 2021-03-10 MED ORDER — SODIUM CHLORIDE 0.9% IV SOLUTION: Freq: Once | INTRAVENOUS | Status: DC

## 2021-03-10 MED ORDER — HEPARIN SODIUM (PORCINE) 1000 UNIT/ML IJ SOLN: 4000.0000 [IU] | Freq: Once | INTRAMUSCULAR | Status: AC

## 2021-03-10 MED ORDER — ACETAMINOPHEN 325 MG PO TABS
650.0000 mg | ORAL_TABLET | Freq: Once | ORAL | Status: AC
  Administered 2021-03-11: 650 mg via ORAL
  Filled 2021-03-10: qty 2

## 2021-03-10 MED ORDER — DIPHENHYDRAMINE HCL 50 MG/ML IJ SOLN
25.0000 mg | Freq: Once | INTRAMUSCULAR | Status: DC
  Filled 2021-03-10: qty 1

## 2021-03-10 MED ORDER — HEPARIN SODIUM (PORCINE) 1000 UNIT/ML IJ SOLN
INTRAMUSCULAR | Status: AC
  Administered 2021-03-10: 4000 [IU] via INTRAVENOUS
  Filled 2021-03-10: qty 4

## 2021-03-10 MED ORDER — LACTULOSE 10 GM/15ML PO SOLN
30.0000 g | Freq: Two times a day (BID) | ORAL | Status: DC | PRN
  Administered 2021-03-10: 30 g via ORAL
  Filled 2021-03-10: qty 45

## 2021-03-10 NOTE — Procedures (Signed)
I was present at this dialysis session. I have reviewed the session itself and made appropriate changes.   Filed Weights   03/07/21 0740 03/07/21 1147 03/10/21 0740  Weight: (!) 137.4 kg 134.4 kg 131.6 kg    Recent Labs  Lab 03/10/21 0301  NA 130*  K 5.3*  CL 92*  CO2 24  GLUCOSE 172*  BUN 92*  CREATININE 14.08*  CALCIUM 8.6*  PHOS 7.8*    Recent Labs  Lab 03/07/21 0800 03/09/21 1001 03/10/21 0301  WBC 6.4 4.7 4.0  HGB 7.1* 7.2* 6.8*  HCT 22.2* 22.8* 20.9*  MCV 85.7 83.8 83.9  PLT 246 288 286    Scheduled Meds:  sodium chloride   Intravenous Once   acetaminophen  650 mg Oral Once   amLODipine  10 mg Oral Daily   calcium acetate  1,334 mg Oral TID WC   carvedilol  25 mg Oral BID WC   Chlorhexidine Gluconate Cloth  6 each Topical Q0600   cloNIDine  0.3 mg Oral TID   darbepoetin (ARANESP) injection - DIALYSIS  60 mcg Intravenous Q Thu-HD   diphenhydrAMINE  25 mg Intravenous Once   feeding supplement (NEPRO CARB STEADY)  237 mL Oral BID BM   heparin injection (subcutaneous)  5,000 Units Subcutaneous Q8H   insulin aspart  0-6 Units Subcutaneous TID WC   isosorbide mononitrate  30 mg Oral Daily   kidney failure book   Does not apply Once   multivitamin  1 tablet Oral QHS   polyethylene glycol  17 g Oral Daily   senna-docusate  2 tablet Oral QHS   Continuous Infusions:  ferric gluconate (FERRLECIT) IVPB     PRN Meds:.acetaminophen **OR** acetaminophen, fentaNYL (SUBLIMAZE) injection, hydrALAZINE, labetalol, lidocaine-prilocaine, oxyCODONE, polyvinyl alcohol   Gean Quint, MD [facility] 03/10/2021, 11:29 AM

## 2021-03-10 NOTE — Plan of Care (Signed)
  Problem: Education: Goal: Knowledge of General Education information will improve Description: Including pain rating scale, medication(s)/side effects and non-pharmacologic comfort measures Outcome: Progressing   Problem: Clinical Measurements: Goal: Cardiovascular complication will be avoided Outcome: Progressing   Problem: Clinical Measurements: Goal: Ability to maintain clinical measurements within normal limits will improve Outcome: Progressing   Problem: Clinical Measurements: Goal: Will remain free from infection Outcome: Progressing   Problem: Clinical Measurements: Goal: Diagnostic test results will improve Outcome: Progressing   Problem: Pain Managment: Goal: General experience of comfort will improve Outcome: Progressing   Problem: Safety: Goal: Ability to remain free from injury will improve Outcome: Progressing   Problem: Skin Integrity: Goal: Risk for impaired skin integrity will decrease Outcome: Progressing

## 2021-03-10 NOTE — Progress Notes (Signed)
PT Cancellation Note  Patient Details Name: Tyreq Balough MRN: [id number] DOB: Mar 29, 1996   Cancelled Treatment:    Reason Eval/Treat Not Completed: Patient at procedure or test/unavailable (Pt still in HD. Will return as able.)   Alvira Philips 03/10/2021, 11:40 AM Ronita Hargreaves M,PT Acute Rehab Services [phone] (pager)

## 2021-03-10 NOTE — Progress Notes (Addendum)
PROGRESS NOTE        PATIENT DETAILS Name: Bobby [name] Age: 25 y.o. Sex: male Date of Birth: 1995/12/29 Admit Date: 02/27/2021 Admitting Physician Evalee Mutton Kristeen Mans, MD PCP:Pcp, No  Brief Narrative: Patient is a 25 y.o. male with PMHx DM-1, HTN, CKD stage IIIb-presented with nausea/vomiting/weakness/decreased appetite-found to have progressive CKD with worsening renal failure.  Significant events: 6/26>> admit-nausea/vomiting/weakness-progressive CKD with worsening renal failure. 6/27>>started on HD  Significant studies: 6/26>> CT abdomen/pelvis: No acute intra-abdominal process-no hydronephrosis. 6/27>> A1c: 8.2 6/27>> HIV: Nonreactive 6/27>> ANA: Positive 6/27>> dsDNA:negative 6/27>> SSA antibody, SSB antibody: Negative 6/27>> anticentromere, anti-Jo-1: Negative 6/27>> c-ANCA/p-ANCA titers: Negative 6/27>> hepatitis C antibody: Nonreactive 6/27>> HBsAg: Nonreactive 6/27>> glomerular basement membrane antibody:neg 6/27>> SPEP: No M spike 6/28>> UA:> 300 protein,+RBC  Antimicrobial therapy: None  Microbiology data: 6/26>> COVID PCR: Negative  Procedures : 6/27>> TDC placed by IR. [id number] left basilic vein transposition fistula by vascular surgery  Consults: Nephrology, IR, vascular surgery  DVT Prophylaxis : heparin injection 5,000 Units Start: 03/05/21 1400 Place and maintain sequential compression device Start: 03/03/21 0910   Subjective: Tells me that his left leg weakness has essentially resolved-he thinks his back to his baseline.  Seen in dialysis today.  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  Uremic symptoms are completely resolved after initiation of HD.  Awaiting outpatient HD placement.  Hypertensive urgency: BP overall  better controlled-continue amlodipine/Imdur/clonidine and Coreg-nephrology plans to slowly increase UF goals.  Follow and adjust.    Nausea/vomiting: Has resolved.  Likely due to uremia-no significant abnormality seen on CT abdomen.  Normocytic anemia: Due to CKD-hemoglobin 6.8 today-we will transfuse 1 unit of PRBC-repeat CBC tomorrow morning.  Hyponatremia: Should improve with HD which is scheduled for tomorrow.  Follow periodically.  Nontraumatic rhabdomyolysis: CK downtrending-likely clinically insignificant.  Mildly prolonged QTC: Resolved   DM-1: CBG stable with SSI-follow and adjust.  CBG (last 3)  Recent Labs    03/09/21 1157 03/09/21 1649 03/09/21 2035  GLUCAP 197* 147* 165*      Left leg weakness: Developed on 7/5-he did not have any focal deficits but felt wobbly when walking-CT head was negative for CVA-he could not tolerate a MRI brain due to claustrophobia.  Per patient-this has significantly improved with supportive care-he does not desire to pursue any further work-up-given rapid improvement-suspect this was more of a nerve compression than CVA.  Ambulate with PT and see how he does.    Obesity: Estimated body mass index is 34.4 kg/m as calculated from the following:   Height as of this encounter: '6\' 5"'$  (1.956 m).   Weight as of this encounter: 131.6 kg.   Diet: Diet Order             Diet renal/carb modified with fluid restriction Diet-HS Snack? Nothing; Fluid restriction: 1200 mL Fluid; Room service appropriate? Yes; Fluid consistency: Thin  Diet effective now                    Code Status: Full code   Family Communication: None at bedside.  Disposition Plan: Status is: Inpatient  The patient will require care spanning > 2 midnights and should be moved to inpatient because: Inpatient level of care appropriate due  to severity of illness  Dispo: The patient is from: Home              Anticipated d/c is to: Home              Patient currently  is not medically stable to d/c.   Difficult to place patient No   Barriers to Discharge: Awaiting outpatient HD chair prior to discharge.  Antimicrobial agents: Anti-infectives (From admission, onward)    Start     Dose/Rate Route Frequency Ordered Stop   03/04/21 0600  cefUROXime (ZINACEF) 1.5 g in sodium chloride 0.9 % 100 mL IVPB        1.5 g 200 mL/hr over 30 Minutes Intravenous On call to O.R. 03/03/21 1018 03/04/21 1145   02/28/21 1030  ceFAZolin (ANCEF) IVPB 2g/100 mL premix        2 g 200 mL/hr over 30 Minutes Intravenous To Radiology 02/28/21 0943 02/28/21 1547        Time spent: 25 minutes-Greater than 50% of this time was spent in counseling, explanation of diagnosis, planning of further management, and coordination of care.  MEDICATIONS: Scheduled Meds:  sodium chloride   Intravenous Once   acetaminophen  650 mg Oral Once   amLODipine  10 mg Oral Daily   calcium acetate  1,334 mg Oral TID WC   carvedilol  25 mg Oral BID WC   Chlorhexidine Gluconate Cloth  6 each Topical Q0600   cloNIDine  0.3 mg Oral TID   darbepoetin (ARANESP) injection - DIALYSIS  60 mcg Intravenous Q Thu-HD   diphenhydrAMINE  25 mg Intravenous Once   feeding supplement (NEPRO CARB STEADY)  237 mL Oral BID BM   heparin injection (subcutaneous)  5,000 Units Subcutaneous Q8H   heparin sodium (porcine)       heparin sodium (porcine)  4,000 Units Intravenous Once   insulin aspart  0-6 Units Subcutaneous TID WC   isosorbide mononitrate  30 mg Oral Daily   kidney failure book   Does not apply Once   multivitamin  1 tablet Oral QHS   polyethylene glycol  17 g Oral Daily   senna-docusate  2 tablet Oral QHS   Continuous Infusions:  ferric gluconate (FERRLECIT) IVPB      PRN Meds:.acetaminophen **OR** acetaminophen, fentaNYL (SUBLIMAZE) injection, hydrALAZINE, labetalol, lidocaine-prilocaine, oxyCODONE, polyvinyl alcohol   PHYSICAL EXAM: Vital signs: Vitals:   03/10/21 1058 03/10/21 1100  03/10/21 1113 03/10/21 1130  BP: (!) 186/93 (!) 178/92 (!) 169/89 (!) 179/94  Pulse: 88 89 89 89  Resp: (!) 23 (!) 25 (!) 25 20  Temp: 98.5 F (36.9 C)  98.7 F (37.1 C) 98.7 F (37.1 C)  TempSrc: Oral  Oral Oral  SpO2: 96% 96% 96% 96%  Weight:      Height:       Filed Weights   03/07/21 0740 03/07/21 1147 03/10/21 0740  Weight: (!) 137.4 kg 134.4 kg 131.6 kg   Body mass index is 34.4 kg/m.   Gen Exam:Alert awake-not in any distress HEENT:atraumatic, normocephalic Chest: B/L clear to auscultation anteriorly CVS:S1S2 regular Abdomen:soft non tender, non distended Extremities:no edema Neurology: Non focal Skin: no rash   I have personally reviewed following labs and imaging studies  LABORATORY DATA: CBC: Recent Labs  Lab 03/06/21 0318 03/07/21 0800 03/09/21 1001 03/10/21 0301  WBC 6.0 6.4 4.7 4.0  HGB 7.2* 7.1* 7.2* 6.8*  HCT 22.5* 22.2* 22.8* 20.9*  MCV 85.6 85.7 83.8 83.9  PLT 219 246 288  286     Basic Metabolic Panel: Recent Labs  Lab 03/07/21 0327 03/08/21 0034 03/09/21 0034 03/09/21 1001 03/10/21 0301  NA 131* 130* 131* 129* 130*  K 5.8* 4.7 4.6 4.7 5.3*  CL 96* 92* 92* 90* 92*  CO2 '22 27 27 25 24  '$ GLUCOSE 183* 229* 210* 170* 172*  BUN 86* 56* 71* 78* 92*  CREATININE 15.03* 9.95* 11.93* 12.69* 14.08*  CALCIUM 8.6* 8.3* 8.9 9.0 8.6*  PHOS 8.0* 6.5* 7.2* 7.4* 7.8*     GFR: Estimated Creatinine Clearance: 11.9 mL/min (A) (by C-G formula based on SCr of 14.08 mg/dL (H)).  Liver Function Tests: Recent Labs  Lab 03/07/21 0327 03/08/21 0034 03/09/21 0034 03/09/21 1001 03/10/21 0301  ALBUMIN 2.2* 2.2* 2.4* 2.5* 2.4*    No results for input(s): LIPASE, AMYLASE in the last 168 hours.  No results for input(s): AMMONIA in the last 168 hours.  Coagulation Profile: No results for input(s): INR, PROTIME in the last 168 hours.  Cardiac Enzymes: No results for input(s): CKTOTAL, CKMB, CKMBINDEX, TROPONINI in the last 168 hours.   BNP (last  3 results) No results for input(s): PROBNP in the last 8760 hours.  Lipid Profile: No results for input(s): CHOL, HDL, LDLCALC, TRIG, CHOLHDL, LDLDIRECT in the last 72 hours.   Thyroid Function Tests: No results for input(s): TSH, T4TOTAL, FREET4, T3FREE, THYROIDAB in the last 72 hours.   Anemia Panel: No results for input(s): VITAMINB12, FOLATE, FERRITIN, TIBC, IRON, RETICCTPCT in the last 72 hours.   Urine analysis:    Component Value Date/Time   COLORURINE YELLOW 03/01/2021 0847   APPEARANCEUR CLEAR 03/01/2021 0847   LABSPEC 1.010 03/01/2021 0847   PHURINE 7.0 03/01/2021 0847   GLUCOSEU 150 (A) 03/01/2021 0847   HGBUR SMALL (A) 03/01/2021 0847   BILIRUBINUR NEGATIVE 03/01/2021 0847   KETONESUR 5 (A) 03/01/2021 0847   PROTEINUR >=300 (A) 03/01/2021 0847   NITRITE NEGATIVE 03/01/2021 0847   LEUKOCYTESUR NEGATIVE 03/01/2021 0847    Sepsis Labs: Lactic Acid, Venous No results found for: LATICACIDVEN  MICROBIOLOGY: No results found for this or any previous visit (from the past 240 hour(s)).   RADIOLOGY STUDIES/RESULTS: CT HEAD WO CONTRAST  Result Date: 03/08/2021 CLINICAL DATA:  Right leg weakness. EXAM: CT HEAD WITHOUT CONTRAST TECHNIQUE: Contiguous axial images were obtained from the base of the skull through the vertex without intravenous contrast. COMPARISON:  CT head dated June 05, 2019. FINDINGS: Brain: No evidence of acute infarction, hemorrhage, hydrocephalus, extra-axial collection or mass lesion/mass effect. Vascular: No hyperdense vessel or unexpected calcification. Skull: Normal. Negative for fracture or focal lesion. Sinuses/Orbits: No acute finding. Other: None. IMPRESSION: 1. Normal noncontrast head CT. Electronically Signed   By: Titus Dubin M.D.   On: 03/08/2021 17:27     LOS: 10 days   Oren Binet, MD  [facility]    To contact the attending provider between 7A-7P or the covering provider during after hours 7P-7A, please log into the  web site [email or web address] and access using universal  password for that web site. If you do not have the password, please call the hospital operator.  03/10/2021, 12:28 PM

## 2021-03-10 NOTE — Progress Notes (Signed)
[facility] PROGRESS NOTE  Assessment/ Plan: Pt is a 25 y.o. yo male  with history of type 1 diabetes since very young age, retinopathy, uncontrolled hypertension on metoprolol, CKD 3 and was followed by nephrologist in [facility] with creatinine level 2.10 in 09/2019, presented with severe nausea, vomiting, decreased appetite and weakness for about 6 weeks, seen as a consultation for the management of advanced renal failure. SBP >200 and creatinine level around 26.  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  The creatinine level was around 2 back in 2021 and was seen by nephrologist in [facility].  He has uncontrolled diabetes and hypertension with poor follow-up.  Now he presents with around 6 weeks of uremic symptoms with creatinine level peaked at 26.  The CT scan ruled out hydronephrosis/obstruction. Urinalysis with proteinuria UPC 3.7 and microscopic hematuria.  Hep B, hep C, HIV, ANCA, SPEP negative.  ANA positive with unremarkable complements and antidsDNA negative.  Follow-up anti-GBM. 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 I am not sure how much benefit he will get with kidney biopsy at this time, he probably has a lot of chronic finding/scarring and I believe that there will not be much change in his current management. Offered biopsy to [place] and at this time he would like to hold off on any further procedures especially given that his treatment would still likely stay the same. Of note, he has had nephrotic range proteinuria even back in 2020 (UPC 4.7g, result in careeverywhere) hence expecting to see chronic changes -The AV fistula was placed by Dr. Donnetta Hutching on 7/1 and has good thrill and bruit. -clip for outpatient hd placement: tts [facility], awaiting insurance -Will keep on TTS sched for HD  Hypertensive urgency with renal failure:  -on  amlodipine, imdur, clondine, coreg -BP still above goal, will increase UF goals for today  Metabolic acidosis with CKD: resolved. Now managed with dialysis.    Anemia of chronic kidney disease: Iron saturation 39% with serum iron 92. On esa, iv iron resumed, 1u prbc today   Secondary hyperparathyroidism: PTH 259 at goal for CKD.  Started PhosLo for hyperphosphatemia.  Hyponatremia -inc'ing UF goals, managing with HD on 137Na bath  Subjective: Seen and examined at bedside.  No acute events. Eager to go home. 1u prbc with hd today. Ufg 4500  Objective Vital signs in last 24 hours: Vitals:   03/10/21 1043 03/10/21 1058 03/10/21 1100 03/10/21 1113  BP: (!) 183/91 (!) 186/93 (!) 178/92 (!) 169/89  Pulse: 91 88 89 89  Resp: (!) 21 (!) 23 (!) 25 (!) 25  Temp: 98.6 F (37 C) 98.5 F (36.9 C)  98.7 F (37.1 C)  TempSrc: Oral Oral  Oral  SpO2: 94% 96% 96% 96%  Weight:      Height:       Weight change:   Intake/Output Summary (Last 24 hours) at 03/10/2021 1129 Last data filed at 03/10/2021 1113 Gross per 24 hour  Intake 300 ml  Output --  Net 300 ml       Labs: Basic Metabolic Panel: Recent Labs  Lab 03/09/21 0034 03/09/21 1001 03/10/21 0301  NA 131* 129* 130*  K 4.6 4.7 5.3*  CL 92* 90* 92*  CO2 '27 25 24  '$ GLUCOSE 210* 170* 172*  BUN 71* 78* 92*  CREATININE 11.93* 12.69* 14.08*  CALCIUM 8.9 9.0 8.6*  PHOS  7.2* 7.4* 7.8*   Liver Function Tests: Recent Labs  Lab 03/09/21 0034 03/09/21 1001 03/10/21 0301  ALBUMIN 2.4* 2.5* 2.4*   No results for input(s): LIPASE, AMYLASE in the last 168 hours. No results for input(s): AMMONIA in the last 168 hours. CBC: Recent Labs  Lab 03/06/21 0318 03/07/21 0800 03/09/21 1001 03/10/21 0301  WBC 6.0 6.4 4.7 4.0  HGB 7.2* 7.1* 7.2* 6.8*  HCT 22.5* 22.2* 22.8* 20.9*  MCV 85.6 85.7 83.8 83.9  PLT 219 246 288 286   Cardiac Enzymes: No results for input(s): CKTOTAL, CKMB, CKMBINDEX, TROPONINI in the last 168  hours.  CBG: Recent Labs  Lab 03/08/21 2115 03/09/21 0808 03/09/21 1157 03/09/21 1649 03/09/21 2035  GLUCAP 211* 175* 197* 147* 165*    Iron Studies:  No results for input(s): IRON, TIBC, TRANSFERRIN, FERRITIN in the last 72 hours.  Studies/Results: CT HEAD WO CONTRAST  Result Date: 03/08/2021 CLINICAL DATA:  Right leg weakness. EXAM: CT HEAD WITHOUT CONTRAST TECHNIQUE: Contiguous axial images were obtained from the base of the skull through the vertex without intravenous contrast. COMPARISON:  CT head dated June 05, 2019. FINDINGS: Brain: No evidence of acute infarction, hemorrhage, hydrocephalus, extra-axial collection or mass lesion/mass effect. Vascular: No hyperdense vessel or unexpected calcification. Skull: Normal. Negative for fracture or focal lesion. Sinuses/Orbits: No acute finding. Other: None. IMPRESSION: 1. Normal noncontrast head CT. Electronically Signed   By: Titus Dubin M.D.   On: 03/08/2021 17:27    Medications: Infusions:  ferric gluconate (FERRLECIT) IVPB        Scheduled Medications:  sodium chloride   Intravenous Once   acetaminophen  650 mg Oral Once   amLODipine  10 mg Oral Daily   calcium acetate  1,334 mg Oral TID WC   carvedilol  25 mg Oral BID WC   Chlorhexidine Gluconate Cloth  6 each Topical Q0600   cloNIDine  0.3 mg Oral TID   darbepoetin (ARANESP) injection - DIALYSIS  60 mcg Intravenous Q Thu-HD   diphenhydrAMINE  25 mg Intravenous Once   feeding supplement (NEPRO CARB STEADY)  237 mL Oral BID BM   heparin injection (subcutaneous)  5,000 Units Subcutaneous Q8H   insulin aspart  0-6 Units Subcutaneous TID WC   isosorbide mononitrate  30 mg Oral Daily   kidney failure book   Does not apply Once   multivitamin  1 tablet Oral QHS   polyethylene glycol  17 g Oral Daily   senna-docusate  2 tablet Oral QHS    have reviewed scheduled and prn medications.  Physical Exam: General:NAD, comfortable Heart:RRR, s1s2 nl Lungs: normal  wob Abdomen:soft, Non-tender, non-distended Extremities: No LE edema Neuro: awake, alert, moves all ext spontaneously, speech clear and coherent Dialysis Access: Right IJ TDC, left AV fistula site clean, without bleeding, has good thrill and bruit.  Jahyra Sukup 03/10/2021,11:29 AM  LOS: 10 days

## 2021-03-10 NOTE — Progress Notes (Signed)
PT Cancellation Note  Patient Details Name: Bobby [name] MRN: [id number] DOB: [birth year]   Cancelled Treatment:    Reason Eval/Treat Not Completed: Patient at procedure or test/unavailable (Pt in HD. will return as able.)   Alvira Philips 03/10/2021, 8:52 AM  Tkeyah Burkman M,PT Acute Rehab Services [phone] (pager)

## 2021-03-10 NOTE — Progress Notes (Signed)
Update from [employer]:  Joaine Mckeel [email or web address]> *Caution - External email - see footer for warnings*  Good afternoon,  An application was secured yesterday and faxed to [place].  I have also submitted a referral to the [facility] for disability.  Let me know if there is anything else you need at this time.  Thanks,   [place], MSW, LCSW 7/7/20224:03 PM

## 2021-03-11 LAB — RENAL FUNCTION PANEL
Albumin: 2.4 g/dL — ABNORMAL LOW (ref 3.5–5.0)
Anion gap: 11 (ref 5–15)
BUN: 58 mg/dL — ABNORMAL HIGH (ref 6–20)
CO2: 27 mmol/L (ref 22–32)
Calcium: 8.6 mg/dL — ABNORMAL LOW (ref 8.9–10.3)
Chloride: 93 mmol/L — ABNORMAL LOW (ref 98–111)
Creatinine, Ser: 10.02 mg/dL — ABNORMAL HIGH (ref 0.61–1.24)
GFR, Estimated: 7 mL/min — ABNORMAL LOW (ref >60)
Glucose, Bld: 215 mg/dL — ABNORMAL HIGH (ref 70–99)
Phosphorus: 6.3 mg/dL — ABNORMAL HIGH (ref 2.5–4.6)
Potassium: 4.6 mmol/L (ref 3.5–5.1)
Sodium: 131 mmol/L — ABNORMAL LOW (ref 135–145)

## 2021-03-11 LAB — BPAM RBC
Blood Product Expiration Date: 202207282359
ISSUE DATE / TIME: 202207071016
Unit Type and Rh: 7300

## 2021-03-11 LAB — CBC
HCT: 22.9 % — ABNORMAL LOW (ref 39.0–52.0)
Hemoglobin: 7.7 g/dL — ABNORMAL LOW (ref 13.0–17.0)
MCH: 28 pg (ref 26.0–34.0)
MCHC: 33.6 g/dL (ref 30.0–36.0)
MCV: 83.3 fL (ref 80.0–100.0)
Platelets: 273 10*3/uL (ref 150–400)
RBC: 2.75 MIL/uL — ABNORMAL LOW (ref 4.22–5.81)
RDW: 12.4 % (ref 11.5–15.5)
WBC: 4.5 10*3/uL (ref 4.0–10.5)
nRBC: 0 % (ref 0.0–0.2)

## 2021-03-11 LAB — TYPE AND SCREEN
ABO/RH(D): B POS
Antibody Screen: NEGATIVE
Unit division: 0

## 2021-03-11 LAB — GLUCOSE, CAPILLARY
Glucose-Capillary: 158 mg/dL — ABNORMAL HIGH (ref 70–99)
Glucose-Capillary: 224 mg/dL — ABNORMAL HIGH (ref 70–99)
Glucose-Capillary: 194 mg/dL — ABNORMAL HIGH (ref 70–99)
Glucose-Capillary: 240 mg/dL — ABNORMAL HIGH (ref 70–99)

## 2021-03-11 MED ORDER — DARBEPOETIN ALFA 60 MCG/0.3ML IJ SOSY
60.0000 ug | PREFILLED_SYRINGE | INTRAMUSCULAR | Status: DC
  Administered 2021-03-12: 60 ug via INTRAVENOUS

## 2021-03-11 NOTE — Progress Notes (Signed)
PROGRESS NOTE        PATIENT DETAILS Name: Bobby [name] Age: 25 y.o. Sex: male Date of Birth: 26-Mar-1996 Admit Date: 02/27/2021 Admitting Physician Evalee Mutton Kristeen Mans, MD PCP:Pcp, No  Brief Narrative: Patient is a 25 y.o. male with PMHx DM-1, HTN, CKD stage IIIb-presented with nausea/vomiting/weakness/decreased appetite-found to have progressive CKD with worsening renal failure.  Significant events: 6/26>> admit-nausea/vomiting/weakness-progressive CKD with worsening renal failure. 6/27>>started on HD  Significant studies: 6/26>> CT abdomen/pelvis: No acute intra-abdominal process-no hydronephrosis. 6/27>> A1c: 8.2 6/27>> HIV: Nonreactive 6/27>> ANA: Positive 6/27>> dsDNA:negative 6/27>> SSA antibody, SSB antibody: Negative 6/27>> anticentromere, anti-Jo-1: Negative 6/27>> c-ANCA/p-ANCA titers: Negative 6/27>> hepatitis C antibody: Nonreactive 6/27>> HBsAg: Nonreactive 6/27>> glomerular basement membrane antibody:neg 6/27>> SPEP: No M spike 6/28>> UA:> 300 protein,+RBC  Antimicrobial therapy: None  Microbiology data: 6/26>> COVID PCR: Negative  Procedures : 6/27>> TDC placed by IR. [id number] left basilic vein transposition fistula by vascular surgery  Consults: Nephrology, IR, vascular surgery  DVT Prophylaxis : heparin injection 5,000 Units Start: 03/05/21 1400 Place and maintain sequential compression device Start: 03/03/21 0910   Subjective: No issues overnight-left leg is back to baseline.  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  Uremic symptoms are completely resolved after initiation of HD.  Awaiting outpatient HD placement.  Hypertensive urgency: BP overall better controlled-continue amlodipine/Imdur/clonidine and  Coreg-nephrology plans to slowly increase UF goals.  Follow and adjust.    Nausea/vomiting: Has resolved.  Likely due to uremia-no significant abnormality seen on CT abdomen.  Normocytic anemia: Due to CKD-transfused 1 unit of PRBC on 7/7-hemoglobin stable this morning.  Continue to follow periodically.    Hyponatremia: Should improve with HD which is scheduled for tomorrow.  Follow periodically.  Nontraumatic rhabdomyolysis: CK downtrending-likely clinically insignificant.  Mildly prolonged QTC: Resolved   DM-1: CBG stable with SSI-follow and adjust.  CBG (last 3)  Recent Labs    03/10/21 2101 03/11/21 0758 03/11/21 1214  GLUCAP 221* 158* 224*      Left leg weakness: Developed on 7/7-has completely resolved-this is from nerve compression due to lying in bed-rather than CVA.  CT head was negative.  Patient refused MRI.  Since he is back to baseline-no further work-up is required  Obesity: Estimated body mass index is 33.36 kg/m as calculated from the following:   Height as of this encounter: '6\' 5"'$  (1.956 m).   Weight as of this encounter: 127.6 kg.   Diet: Diet Order             Diet renal/carb modified with fluid restriction Diet-HS Snack? Nothing; Fluid restriction: 1200 mL Fluid; Room service appropriate? Yes; Fluid consistency: Thin  Diet effective now                    Code Status: Full code   Family Communication: None at bedside.  Disposition Plan: Status is: Inpatient  The patient will require care spanning > 2 midnights and should be moved to inpatient because: Inpatient level of care appropriate due to severity of illness  Dispo: The patient is from: Home              Anticipated d/c is to: Home  Patient currently is not medically stable to d/c.   Difficult to place patient No   Barriers to Discharge: Awaiting outpatient HD chair prior to discharge.  Antimicrobial agents: Anti-infectives (From admission, onward)    Start      Dose/Rate Route Frequency Ordered Stop   03/04/21 0600  cefUROXime (ZINACEF) 1.5 g in sodium chloride 0.9 % 100 mL IVPB        1.5 g 200 mL/hr over 30 Minutes Intravenous On call to O.R. 03/03/21 1018 03/04/21 1145   02/28/21 1030  ceFAZolin (ANCEF) IVPB 2g/100 mL premix        2 g 200 mL/hr over 30 Minutes Intravenous To Radiology 02/28/21 0943 02/28/21 1547        Time spent: 15 minutes-Greater than 50% of this time was spent in counseling, explanation of diagnosis, planning of further management, and coordination of care.  MEDICATIONS: Scheduled Meds:  sodium chloride   Intravenous Once   amLODipine  10 mg Oral Daily   calcium acetate  1,334 mg Oral TID WC   carvedilol  25 mg Oral BID WC   Chlorhexidine Gluconate Cloth  6 each Topical Q0600   cloNIDine  0.3 mg Oral TID   darbepoetin (ARANESP) injection - DIALYSIS  60 mcg Intravenous Q Thu-HD   diphenhydrAMINE  25 mg Intravenous Once   feeding supplement (NEPRO CARB STEADY)  237 mL Oral BID BM   heparin injection (subcutaneous)  5,000 Units Subcutaneous Q8H   insulin aspart  0-6 Units Subcutaneous TID WC   isosorbide mononitrate  30 mg Oral Daily   kidney failure book   Does not apply Once   multivitamin  1 tablet Oral QHS   polyethylene glycol  17 g Oral Daily   senna-docusate  2 tablet Oral QHS   Continuous Infusions:  ferric gluconate (FERRLECIT) IVPB 125 mg (03/10/21 1507)    PRN Meds:.acetaminophen **OR** acetaminophen, fentaNYL (SUBLIMAZE) injection, hydrALAZINE, labetalol, lactulose, oxyCODONE, polyvinyl alcohol   PHYSICAL EXAM: Vital signs: Vitals:   03/11/21 0337 03/11/21 0744 03/11/21 1037 03/11/21 1212  BP:  (!) 166/84 (!) 154/68 (!) 159/84  Pulse:  87  84  Resp: 19     Temp: 98.1 F (36.7 C) 98.2 F (36.8 C)  98.9 F (37.2 C)  TempSrc: Oral Oral  Oral  SpO2:      Weight:      Height:       Filed Weights   03/07/21 1147 03/10/21 0740 03/10/21 1200  Weight: 134.4 kg 131.6 kg 127.6 kg   Body  mass index is 33.36 kg/m.   Gen Exam:Alert awake-not in any distress HEENT:atraumatic, normocephalic Chest: B/L clear to auscultation anteriorly CVS:S1S2 regular Abdomen:soft non tender, non distended Extremities:no edema Neurology: Non focal Skin: no rash   I have personally reviewed following labs and imaging studies  LABORATORY DATA: CBC: Recent Labs  Lab 03/06/21 0318 03/07/21 0800 03/09/21 1001 03/10/21 0301 03/11/21 0220  WBC 6.0 6.4 4.7 4.0 4.5  HGB 7.2* 7.1* 7.2* 6.8* 7.7*  HCT 22.5* 22.2* 22.8* 20.9* 22.9*  MCV 85.6 85.7 83.8 83.9 83.3  PLT 219 246 288 286 273     Basic Metabolic Panel: Recent Labs  Lab 03/08/21 0034 03/09/21 0034 03/09/21 1001 03/10/21 0301 03/11/21 0220  NA 130* 131* 129* 130* 131*  K 4.7 4.6 4.7 5.3* 4.6  CL 92* 92* 90* 92* 93*  CO2 '27 27 25 24 27  '$ GLUCOSE 229* 210* 170* 172* 215*  BUN 56* 71* 78* 92* 58*  CREATININE 9.95* 11.93* 12.69* 14.08* 10.02*  CALCIUM 8.3* 8.9 9.0 8.6* 8.6*  PHOS 6.5* 7.2* 7.4* 7.8* 6.3*     GFR: Estimated Creatinine Clearance: 16.5 mL/min (A) (by C-G formula based on SCr of 10.02 mg/dL (H)).  Liver Function Tests: Recent Labs  Lab 03/08/21 0034 03/09/21 0034 03/09/21 1001 03/10/21 0301 03/11/21 0220  ALBUMIN 2.2* 2.4* 2.5* 2.4* 2.4*    No results for input(s): LIPASE, AMYLASE in the last 168 hours.  No results for input(s): AMMONIA in the last 168 hours.  Coagulation Profile: No results for input(s): INR, PROTIME in the last 168 hours.  Cardiac Enzymes: No results for input(s): CKTOTAL, CKMB, CKMBINDEX, TROPONINI in the last 168 hours.   BNP (last 3 results) No results for input(s): PROBNP in the last 8760 hours.  Lipid Profile: No results for input(s): CHOL, HDL, LDLCALC, TRIG, CHOLHDL, LDLDIRECT in the last 72 hours.   Thyroid Function Tests: No results for input(s): TSH, T4TOTAL, FREET4, T3FREE, THYROIDAB in the last 72 hours.   Anemia Panel: No results for input(s):  VITAMINB12, FOLATE, FERRITIN, TIBC, IRON, RETICCTPCT in the last 72 hours.   Urine analysis:    Component Value Date/Time   COLORURINE YELLOW 03/01/2021 0847   APPEARANCEUR CLEAR 03/01/2021 0847   LABSPEC 1.010 03/01/2021 0847   PHURINE 7.0 03/01/2021 0847   GLUCOSEU 150 (A) 03/01/2021 0847   HGBUR SMALL (A) 03/01/2021 0847   BILIRUBINUR NEGATIVE 03/01/2021 0847   KETONESUR 5 (A) 03/01/2021 0847   PROTEINUR >=300 (A) 03/01/2021 0847   NITRITE NEGATIVE 03/01/2021 0847   LEUKOCYTESUR NEGATIVE 03/01/2021 0847    Sepsis Labs: Lactic Acid, Venous No results found for: LATICACIDVEN  MICROBIOLOGY: No results found for this or any previous visit (from the past 240 hour(s)).   RADIOLOGY STUDIES/RESULTS: No results found.   LOS: 11 days   Oren Binet, MD  [facility]    To contact the attending provider between 7A-7P or the covering provider during after hours 7P-7A, please log into the web site [email or web address] and access using universal [place] password for that web site. If you do not have the password, please call the hospital operator.  03/11/2021, 12:28 PM

## 2021-03-11 NOTE — Progress Notes (Signed)
Renal Navigator has received notification of financial clearance from [facility]missions. Patient has been given a TTS schedule at [facility] with a seat time of 12:00pm. He needs to arrive to his appointments at 11:40am. He will receive dialysis here tomorrow and can start in the outpatient clinic on Tuesday, 03/15/21. On his first day at the clinic, patient needs to arrive at 11:00am to complete intake paperwork prior to his first treatment.  Navigator met with patient to provide information verbally and in writing. Patient is very excited that he will be able to go home tomorrow and states that his sister will be able to take him to his appointment on Tuesday.  Navigator asked if he has called [employer]. He said he figured it would not cover, since it didn't cover HD. Navigator thinks [employer] will cover transportation (though not HD), but is not certain. Navigator suggests calling to inquire and enrolling if possible. He agreed. Navigator explained that Education officer, museum at the HD clinic in [place] is very knowledgeable regarding transportation services in [place] and can advise on other services if needed. Patient was appreciative and understanding.  Attending, Nephrologist and CSW updated. Renal Navigator asked Renal PA to send outpatient orders to outpatient clinic and clinic aware that patient will start Tuesday.   Alphonzo Cruise, [place] Renal Navigator [phone]

## 2021-03-11 NOTE — Progress Notes (Signed)
[facility] NEPHROLOGY PROGRESS NOTE  Assessment/ Plan: Pt is a 25 y.o. yo male  with history of type 1 diabetes since very young age, retinopathy, uncontrolled hypertension on metoprolol, CKD 3 and was followed by nephrologist in [facility] with creatinine level 2.10 in 09/2019, presented with severe nausea, vomiting, decreased appetite and weakness for about 6 weeks, seen as a consultation for the management of advanced renal failure. SBP >200 and creatinine level around 26.  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  The creatinine level was around 2 back in 2021 and was seen by nephrologist in [facility].  He has uncontrolled diabetes and hypertension with poor follow-up.  Now he presents with around 6 weeks of uremic symptoms with creatinine level peaked at 26.  The CT scan ruled out hydronephrosis/obstruction. Urinalysis with proteinuria UPC 3.7 and microscopic hematuria.  Hep B, hep C, HIV, ANCA, SPEP negative.  ANA positive with unremarkable complements and antidsDNA negative.  Follow-up anti-GBM. 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 I am not sure how much benefit he will get with kidney biopsy at this time, he probably has a lot of chronic finding/scarring and I believe that there will not be much change in his current management. Offered biopsy to [place] and at this time he would like to hold off on any further procedures especially given that his treatment would still likely stay the same. Of note, he has had nephrotic range proteinuria even back in 2020 (UPC 4.7g, result in careeverywhere) hence expecting to see chronic changes -The AV fistula was placed by Dr. Donnetta Hutching on 7/1 and has good thrill and bruit. -clip for outpatient hd placement: tts [facility], awaiting insurance -Will keep on TTS sched for HD, next treatment tomorrow  Hypertensive urgency  with renal failure:  -on amlodipine, imdur, clondine, coreg -BP still above goal, but improved with increasing uf goals. Will aim to UF more tomorrow  Metabolic acidosis with CKD: resolved. Now managed with dialysis.    Anemia of chronic kidney disease: Iron saturation 39% with serum iron 92. On esa, iv iron resumed, 1u prbc 7/7   Secondary hyperparathyroidism: PTH 259 at goal for CKD.  Started PhosLo for hyperphosphatemia. Renal diet  Hyponatremia, stable/improved -inc'ing UF goals, managing with HD on 137Na bath  Subjective: Seen and examined at bedside.  No acute events. Tolerated hd yesterday, net uf 4L. Feels better in regards to leg weakness. No complaints today.  Objective Vital signs in last 24 hours: Vitals:   03/10/21 1938 03/10/21 2349 03/11/21 0337 03/11/21 0744  BP: (!) 171/86 (!) 160/97  (!) 166/84  Pulse: 90 85  87  Resp: '19 19 19   '$ Temp: 98.1 F (36.7 C) 98.4 F (36.9 C) 98.1 F (36.7 C) 98.2 F (36.8 C)  TempSrc: Oral Oral Oral Oral  SpO2: 97% 94%    Weight:      Height:       Weight change:   Intake/Output Summary (Last 24 hours) at 03/11/2021 0829 Last data filed at 03/11/2021 0400 Gross per 24 hour  Intake 420 ml  Output 4000 ml  Net -3580 ml       Labs: Basic Metabolic Panel: Recent Labs  Lab 03/09/21 1001 03/10/21 0301 03/11/21 0220  NA 129* 130* 131*  K 4.7 5.3* 4.6  CL 90* 92* 93*  CO2 '25 24 27  '$ GLUCOSE 170* 172* 215*  BUN 78* 92* 58*  CREATININE 12.69* 14.08* 10.02*  CALCIUM 9.0 8.6* 8.6*  PHOS 7.4* 7.8* 6.3*   Liver Function Tests: Recent Labs  Lab 03/09/21 1001 03/10/21 0301 03/11/21 0220  ALBUMIN 2.5* 2.4* 2.4*   No results for input(s): LIPASE, AMYLASE in the last 168 hours. No results for input(s): AMMONIA in the last 168 hours. CBC: Recent Labs  Lab 03/06/21 0318 03/07/21 0800 03/09/21 1001 03/10/21 0301 03/11/21 0220  WBC 6.0 6.4 4.7 4.0 4.5  HGB 7.2* 7.1* 7.2* 6.8* 7.7*  HCT 22.5* 22.2* 22.8* 20.9* 22.9*   MCV 85.6 85.7 83.8 83.9 83.3  PLT 219 246 288 286 273   Cardiac Enzymes: No results for input(s): CKTOTAL, CKMB, CKMBINDEX, TROPONINI in the last 168 hours.  CBG: Recent Labs  Lab 03/09/21 2035 03/10/21 1237 03/10/21 1518 03/10/21 2101 03/11/21 0758  GLUCAP 165* 162* 182* 221* 158*    Iron Studies:  No results for input(s): IRON, TIBC, TRANSFERRIN, FERRITIN in the last 72 hours.  Studies/Results: No results found.  Medications: Infusions:  ferric gluconate (FERRLECIT) IVPB 125 mg (03/10/21 1507)      Scheduled Medications:  sodium chloride   Intravenous Once   acetaminophen  650 mg Oral Once   amLODipine  10 mg Oral Daily   calcium acetate  1,334 mg Oral TID WC   carvedilol  25 mg Oral BID WC   Chlorhexidine Gluconate Cloth  6 each Topical Q0600   cloNIDine  0.3 mg Oral TID   darbepoetin (ARANESP) injection - DIALYSIS  60 mcg Intravenous Q Thu-HD   diphenhydrAMINE  25 mg Intravenous Once   feeding supplement (NEPRO CARB STEADY)  237 mL Oral BID BM   heparin injection (subcutaneous)  5,000 Units Subcutaneous Q8H   insulin aspart  0-6 Units Subcutaneous TID WC   isosorbide mononitrate  30 mg Oral Daily   kidney failure book   Does not apply Once   multivitamin  1 tablet Oral QHS   polyethylene glycol  17 g Oral Daily   senna-docusate  2 tablet Oral QHS    have reviewed scheduled and prn medications.  Physical Exam: General:NAD, comfortable Heart:RRR, s1s2 nl Lungs: normal wob Abdomen:soft, Non-tender, non-distended Extremities: No LE edema Neuro: awake, alert, moves all ext spontaneously, speech clear and coherent Dialysis Access: Right IJ TDC, left AV fistula site clean, without bleeding, has good thrill and bruit.  Chany Woolworth 03/11/2021,8:29 AM  LOS: 11 days

## 2021-03-11 NOTE — Progress Notes (Signed)
PT Cancellation Note  Patient Details Name: Bobby [name] MRN: [id number] DOB: 09-11-1995   Cancelled Treatment:    Reason Eval/Treat Not Completed: PT screened, no needs identified, will sign off. Pt spoke with pt and his RN, both reporting that he has been up and ambulating independently without difficulties. Pt stated he feels he is back to his baseline with mobility. No further acute PT needs identified at this time. PT signing off.    Clearnce Sorrel Harnoor Kohles 03/11/2021, 11:27 AM

## 2021-03-11 NOTE — Progress Notes (Signed)
CSW met with pt regarding need for PCP.  Pt does not have one, agreeable to referral, initially tried [facility] locations but all required cash payment.  Pt agrees to come to [facility] for PCP appt and one was scheduled at [facility].  Pt reports he does have transportation--he drives and his sister will also assist.  Pt aware of HD set up ant TTS schedule, his sister will help with transportation to HD as well.  Pt to DC tomorrow after HD and sister will transport home. Lurline Idol, MSW, LCSW 7/8/20224:11 PM

## 2021-03-11 NOTE — Progress Notes (Signed)
Renal Navigator requested update from [facility]missions regarding financial clearance for treatment in outpatient HD clinic and continues to await confirmation of this.   Alphonzo Cruise, [place] Renal Navigator [phone]

## 2021-03-12 LAB — RENAL FUNCTION PANEL
Albumin: 2.5 g/dL — ABNORMAL LOW (ref 3.5–5.0)
Anion gap: 14 (ref 5–15)
BUN: 79 mg/dL — ABNORMAL HIGH (ref 6–20)
CO2: 24 mmol/L (ref 22–32)
Calcium: 8.8 mg/dL — ABNORMAL LOW (ref 8.9–10.3)
Chloride: 93 mmol/L — ABNORMAL LOW (ref 98–111)
Creatinine, Ser: 11.83 mg/dL — ABNORMAL HIGH (ref 0.61–1.24)
GFR, Estimated: 5 mL/min — ABNORMAL LOW (ref >60)
Glucose, Bld: 246 mg/dL — ABNORMAL HIGH (ref 70–99)
Phosphorus: 6 mg/dL — ABNORMAL HIGH (ref 2.5–4.6)
Potassium: 4.6 mmol/L (ref 3.5–5.1)
Sodium: 131 mmol/L — ABNORMAL LOW (ref 135–145)

## 2021-03-12 LAB — CBC
HCT: 23.7 % — ABNORMAL LOW (ref 39.0–52.0)
Hemoglobin: 7.9 g/dL — ABNORMAL LOW (ref 13.0–17.0)
MCH: 28 pg (ref 26.0–34.0)
MCHC: 33.3 g/dL (ref 30.0–36.0)
MCV: 84 fL (ref 80.0–100.0)
Platelets: 280 10*3/uL (ref 150–400)
RBC: 2.82 MIL/uL — ABNORMAL LOW (ref 4.22–5.81)
RDW: 12.5 % (ref 11.5–15.5)
WBC: 5.6 10*3/uL (ref 4.0–10.5)
nRBC: 0 % (ref 0.0–0.2)

## 2021-03-12 LAB — GLUCOSE, CAPILLARY
Glucose-Capillary: 183 mg/dL — ABNORMAL HIGH (ref 70–99)
Glucose-Capillary: 183 mg/dL — ABNORMAL HIGH (ref 70–99)

## 2021-03-12 MED ORDER — SODIUM CHLORIDE 0.9 % IV SOLN: 100.0000 mL | INTRAVENOUS | Status: DC | PRN

## 2021-03-12 MED ORDER — AMLODIPINE BESYLATE 10 MG PO TABS: 10.0000 mg | ORAL_TABLET | Freq: Every day | ORAL | 1 refills | Status: DC

## 2021-03-12 MED ORDER — ALTEPLASE 2 MG IJ SOLR: 2.0000 mg | Freq: Once | INTRAMUSCULAR | Status: DC | PRN

## 2021-03-12 MED ORDER — INSULIN ASPART 100 UNIT/ML IJ SOLN: 5.0000 [IU] | Freq: Three times a day (TID) | INTRAMUSCULAR | 1 refills | Status: DC

## 2021-03-12 MED ORDER — PENTAFLUOROPROP-TETRAFLUOROETH EX AERO: 1.0000 "application " | INHALATION_SPRAY | CUTANEOUS | Status: DC | PRN

## 2021-03-12 MED ORDER — CLONIDINE HCL 0.3 MG PO TABS: 0.3000 mg | ORAL_TABLET | Freq: Three times a day (TID) | ORAL | 1 refills | Status: DC

## 2021-03-12 MED ORDER — ISOSORBIDE MONONITRATE ER 60 MG PO TB24: 60.0000 mg | ORAL_TABLET | Freq: Every day | ORAL | 1 refills | Status: DC

## 2021-03-12 MED ORDER — CARVEDILOL 25 MG PO TABS: 25.0000 mg | ORAL_TABLET | Freq: Two times a day (BID) | ORAL | 1 refills | Status: DC

## 2021-03-12 MED ORDER — ISOSORBIDE MONONITRATE ER 60 MG PO TB24
60.0000 mg | ORAL_TABLET | Freq: Every day | ORAL | Status: DC
  Administered 2021-03-12: 60 mg via ORAL
  Filled 2021-03-12: qty 1

## 2021-03-12 MED ORDER — LIDOCAINE HCL (PF) 1 % IJ SOLN: 5.0000 mL | INTRAMUSCULAR | Status: DC | PRN

## 2021-03-12 MED ORDER — LIDOCAINE-PRILOCAINE 2.5-2.5 % EX CREA: 1.0000 "application " | TOPICAL_CREAM | CUTANEOUS | Status: DC | PRN

## 2021-03-12 MED ORDER — HEPARIN SODIUM (PORCINE) 1000 UNIT/ML DIALYSIS: 1000.0000 [IU] | INTRAMUSCULAR | Status: DC | PRN

## 2021-03-12 MED ORDER — DARBEPOETIN ALFA 60 MCG/0.3ML IJ SOSY
PREFILLED_SYRINGE | INTRAMUSCULAR | Status: AC
  Filled 2021-03-12: qty 0.3

## 2021-03-12 NOTE — Progress Notes (Signed)
Pt BP 176/91, hydralazine given earlier, no improvement. Labetalol ordered for SBP>180.  Per Hal Hope, MD is ok to give Labetalol for BP 176/91.

## 2021-03-12 NOTE — Progress Notes (Signed)
AVS provided and reviewed with pt. All questions answered at this time.

## 2021-03-12 NOTE — Discharge Summary (Signed)
PATIENT DETAILS Name: Bobby [name] Age: 25 y.o. Sex: male Date of Birth: Jan 28, 1996 MRN: [id number]. Admitting Physician: Jonetta Osgood, MD PCP:Pcp, No  Admit Date: 02/27/2021 Discharge date: 03/12/2021  Recommendations for Outpatient Follow-up:  Follow up with PCP in 1-2 weeks Please obtain CMP/CBC in one week Please ensure follow-up with dialysis clinic  Admitted From:  Home  Disposition: [place]: No  Equipment/Devices: None  Discharge Condition: Stable  CODE STATUS: FULL CODE  Diet recommendation:  Diet Order             Diet - low sodium heart healthy           Diet Carb Modified           Diet renal/carb modified with fluid restriction Diet-HS Snack? Nothing; Fluid restriction: 1200 mL Fluid; Room service appropriate? Yes; Fluid consistency: Thin  Diet effective now                    Brief Summary: Brief Narrative: Patient is a 25 y.o. male with PMHx DM-1, HTN, CKD stage IIIb-presented with nausea/vomiting/weakness/decreased appetite-found to have progressive CKD with worsening renal failure.   Significant events: 6/26>> admit-nausea/vomiting/weakness-progressive CKD with worsening renal failure. 6/27>>started on HD   Significant studies: 6/26>> CT abdomen/pelvis: No acute intra-abdominal process-no hydronephrosis. 6/27>> A1c: 8.2 6/27>> HIV: Nonreactive 6/27>> ANA: Positive 6/27>> dsDNA:negative 6/27>> SSA antibody, SSB antibody: Negative 6/27>> anticentromere, anti-Jo-1: Negative 6/27>> c-ANCA/p-ANCA titers: Negative 6/27>> hepatitis C antibody: Nonreactive 6/27>> HBsAg: Nonreactive 6/27>> glomerular basement membrane antibody:neg 6/27>> SPEP: No M spike 6/28>> UA:> 300 protein,+RBC   Antimicrobial therapy: None   Microbiology data: 6/26>> COVID PCR: Negative   Procedures : 6/27>> TDC placed by IR. [id number] left basilic vein transposition fistula by vascular surgery   Consults: Nephrology, IR, vascular  surgery  Brief Hospital Course: Progressive renal failure/AKI superimposed on CKD stage IIIb-now likely ESRD: Likely due to diabetic or hypertensive nephropathy-Per nephrology this is likely ESRD-and further work-up/biopsy will not change management outcome.  Uremic symptoms are completely resolved after initiation of HD.  Outpatient HD slot arranged-patient will start outpatient HD this coming Tuesday.  Discussed with nephrologist-Dr. Candiss Norse this morning-okay to discharge.   Hypertensive urgency: BP overall better controlled-continue amlodipine/Imdur/clonidine and Coreg-nephrology continues to plan to slowly increase UF goals.  Follow and adjust in the outpatient setting.   Nausea/vomiting: Has resolved.  Likely due to uremia-no significant abnormality seen on CT abdomen.   Normocytic anemia: Due to CKD-transfused 1 unit of PRBC on 7/7-hemoglobin stable this morning.  Defer darbepoetin/IV iron to nephrology-continue to follow closely in the outpatient setting.   Hyponatremia: Should improve with HD which is scheduled for tomorrow.  Follow periodically.   Nontraumatic rhabdomyolysis: CK downtrending-likely clinically insignificant.   Mildly prolonged QTC: Resolved   DM-1: CBG stable with SSI while inpatient-resume usual NovoLog regimen on discharge.  Left leg weakness: Developed on 7/7-has completely resolved-this is from nerve compression due to lying in bed-rather than CVA.  CT head was negative.  Patient refused MRI.  Since he is back to baseline-no further work-up is required   Obesity: Estimated body mass index is 32.55 kg/m as calculated from the following:   Height as of this encounter: '6\' 5"'$  (1.956 m).   Weight as of this encounter: 124.5 kg.    Discharge Diagnoses:  Active Problems:   Acute renal failure superimposed on chronic kidney disease (HCC)   Rhabdomyolysis   Hypertensive emergency without congestive heart failure   Uremia  Prolonged QT interval   Anemia due to  chronic kidney disease   Nausea & vomiting   Dehydration   Diabetes 1.5, managed as type 1 (HCC)   Hypertensive urgency   AKI (acute kidney injury) [facility])   Discharge Instructions:  Activity:  As tolerated   Discharge Instructions     Call MD for:  difficulty breathing, headache or visual disturbances   Complete by: As directed    Call MD for:  extreme fatigue   Complete by: As directed    Call MD for:  persistant dizziness or light-headedness   Complete by: As directed    Diet - low sodium heart healthy   Complete by: As directed    Diet Carb Modified   Complete by: As directed    Discharge instructions   Complete by: As directed    Follow with Primary MD  in 1-2 weeks  Please get a complete blood count and chemistry panel checked by your Primary MD at your next visit, and again as instructed by your Primary MD.  Get Medicines reviewed and adjusted: Please take all your medications with you for your next visit with your Primary MD  Laboratory/radiological data: Please request your Primary MD to go over all hospital tests and procedure/radiological results at the follow up, please ask your Primary MD to get all Hospital records sent to his/her office.  In some cases, they will be blood work, cultures and biopsy results pending at the time of your discharge. Please request that your primary care M.D. follows up on these results.  Also Note the following: If you experience worsening of your admission symptoms, develop shortness of breath, life threatening emergency, suicidal or homicidal thoughts you must seek medical attention immediately by calling 911 or calling your MD immediately  if symptoms less severe.  You must read complete instructions/literature along with all the possible adverse reactions/side effects for all the Medicines you take and that have been prescribed to you. Take any new Medicines after you have completely understood and accpet all the possible adverse  reactions/side effects.   Do not drive when taking Pain medications or sleeping medications (Benzodaizepines)  Do not take more than prescribed Pain, Sleep and Anxiety Medications. It is not advisable to combine anxiety,sleep and pain medications without talking with your primary care practitioner  Special Instructions: If you have smoked or chewed Tobacco  in the last 2 yrs please stop smoking, stop any regular Alcohol  and or any Recreational drug use.  Wear Seat belts while driving.  Please note: You were cared for by a hospitalist during your hospital stay. Once you are discharged, your primary care physician will handle any further medical issues. Please note that NO REFILLS for any discharge medications will be authorized once you are discharged, as it is imperative that you return to your primary care physician (or establish a relationship with a primary care physician if you do not have one) for your post hospital discharge needs so that they can reassess your need for medications and monitor your lab values.   Follow-up with your hemodialysis clinic as instructed-your schedule is Tuesdays, Thursdays, and Saturdays   Increase activity slowly   Complete by: As directed    No dressing needed   Complete by: As directed       Allergies as of 03/12/2021   No Known Allergies      Medication List     STOP taking these medications    metoprolol succinate 50  MG 24 hr tablet Commonly known as: TOPROL-XL       TAKE these medications    amLODipine 10 MG tablet Commonly known as: NORVASC Take 1 tablet (10 mg total) by mouth daily. Start taking on: March 13, 2021   carvedilol 25 MG tablet Commonly known as: COREG Take 1 tablet (25 mg total) by mouth 2 (two) times daily with a meal.   cloNIDine 0.3 MG tablet Commonly known as: CATAPRES Take 1 tablet (0.3 mg total) by mouth 3 (three) times daily.   insulin aspart 100 UNIT/ML injection Commonly known as: novoLOG Inject 5  Units into the skin 3 (three) times daily before meals.   isosorbide mononitrate 60 MG 24 hr tablet Commonly known as: IMDUR Take 1 tablet (60 mg total) by mouth daily.               Discharge Care Instructions  (From admission, onward)           Start     Ordered   03/12/21 0000  No dressing needed        03/12/21 1347            Follow-up Information     [facility] Follow up in 3 week(s).   Specialty: Vascular Surgery Why: Office will call you to arrange your appt (sent) Contact information: [place] [phone]        [place]. Go on 03/29/2021.   Specialty: Family Medicine Why: Please attend your hospital discharge appointment with Juluis Mire, Nurse Practitioner, on Tuesday, 03/29/21, at 230pm. Contact information: [place] [phone]        Hemodialysis clinic Follow up.   Why: Follow as instructed on Tuesdays, Thursdays and Saturdays               No Known Allergies     Other Procedures/Studies: CT ABDOMEN PELVIS WO CONTRAST  Result Date: 02/27/2021 CLINICAL DATA:  Nausea, weakness and loss of appetite x1 week. EXAM: CT ABDOMEN AND PELVIS WITHOUT CONTRAST TECHNIQUE: Multidetector CT imaging of the abdomen and pelvis was performed following the standard protocol without IV contrast. COMPARISON:  None. FINDINGS: Lower chest: Mild atelectasis is seen within the bilateral lung bases. Hepatobiliary: No focal liver abnormality is seen. No gallstones, gallbladder wall thickening, or biliary dilatation. Pancreas: Unremarkable. No pancreatic ductal dilatation or surrounding inflammatory changes. Spleen: Normal in size without focal abnormality. Adrenals/Urinary Tract: Adrenal glands are unremarkable. Kidneys are normal, without renal calculi, focal lesion, or hydronephrosis. Bladder is unremarkable.  Stomach/Bowel: There is a small hiatal hernia. Appendix appears normal. No evidence of bowel wall thickening, distention, or inflammatory changes. Vascular/Lymphatic: No significant vascular findings are present. No enlarged abdominal or pelvic lymph nodes. Reproductive: Prostate is unremarkable. Other: No abdominal wall hernia or abnormality. No abdominopelvic ascites. Musculoskeletal: No acute or significant osseous findings. IMPRESSION: 1. Small hiatal hernia. 2. No acute or active process within the abdomen or pelvis. Electronically Signed   By: Virgina Norfolk M.D.   On: 02/27/2021 21:00   CT HEAD WO CONTRAST  Result Date: 03/08/2021 CLINICAL DATA:  Right leg weakness. EXAM: CT HEAD WITHOUT CONTRAST TECHNIQUE: Contiguous axial images were obtained from the base of the skull through the vertex without intravenous contrast. COMPARISON:  CT head dated June 05, 2019. FINDINGS: Brain: No evidence of acute infarction, hemorrhage, hydrocephalus, extra-axial collection or mass lesion/mass effect. Vascular: No hyperdense vessel or unexpected calcification.  Skull: Normal. Negative for fracture or focal lesion. Sinuses/Orbits: No acute finding. Other: None. IMPRESSION: 1. Normal noncontrast head CT. Electronically Signed   By: Titus Dubin M.D.   On: 03/08/2021 17:27   IR US Guide Vasc Access Right  Result Date: 02/28/2021 INDICATION: Renal failure requiring dialysis EXAM: TUNNELED CENTRAL VENOUS HEMODIALYSIS CATHETER PLACEMENT WITH ULTRASOUND AND FLUOROSCOPIC GUIDANCE MEDICATIONS: Ancef 2 gm IV . The antibiotic was given in an appropriate time interval prior to skin puncture. ANESTHESIA/SEDATION: Moderate (conscious) sedation was employed during this procedure. A total of Versed 3 mg and Fentanyl 100 mcg was administered intravenously. Moderate Sedation Time: 13 minutes. The patient's level of consciousness and vital signs were monitored continuously by radiology nursing throughout the procedure under my  direct supervision. FLUOROSCOPY TIME:  Fluoroscopy Time: 0 minutes 18 seconds (1 mGy). COMPLICATIONS: None immediate. PROCEDURE: Informed written consent was obtained from the patient after a discussion of the risks, benefits, and alternatives to treatment. Questions regarding the procedure were encouraged and answered. The right neck and chest were prepped with chlorhexidine in a sterile fashion, and a sterile drape was applied covering the operative field. Maximum barrier sterile technique with sterile gowns and gloves were used for the procedure. A timeout was performed prior to the initiation of the procedure. The right internal jugular vein was evaluated with ultrasound and shown to be patent. A permanent ultrasound image was obtained and placed in the patient's medical record. Using sterile gel and a sterile probe cover, the right internal jugular vein was entered with a 21 ga needle during real time ultrasound guidance. 0.018 inch guidewire placed and 21 ga needle exchanged for transitional dilator set. Utilizing fluoroscopy, 0.035 inch guidewire advanced through the needle without difficulty. Seriel dilation was performed and peel-away sheath was placed. Attention then turned to the right anterior upper chest. Following local lidocaine administration, the hemodialysis catheter was tunneled from the chest wall to the venotomy site. The catheter was inserted through the peel-away sheath. The tip of the catheter was positioned within the right atrium using fluoroscopic guidance. All lumens of the catheter aspirated and flushed well. The dialysis lumens were locked with Heparin. The catheter was secured to the skin with suture. The insertion site was covered with a Biopatch and sterile dressing. IMPRESSION: Successful placement of 23 cm tip to cuff tunneled hemodialysis catheter via the right internal jugular vein with tips terminating within the superior aspect of the right atrium. The catheter is ready for  immediate use. Electronically Signed   By: Miachel Roux M.D.   On: 02/28/2021 16:15   VAS [place] UPPER EXT VEIN MAPPING (PRE-OP AVF)  Result Date: 03/01/2021 UPPER EXTREMITY VEIN MAPPING Patient Name:  KENTRAVIOUS BRUINSMA  Date of Exam:   03/01/2021 Medical Rec #: [id number]           Accession #:    [id number] Date of Birth: Mar 03, 1996            Patient Gender: M Patient Age:   24Y Exam Location:  [facility] Procedure:      VAS [place] UPPER EXT VEIN MAPPING (PRE-OP AVF) Referring Phys: [id number] DRON PRASAD BHANDARI --------------------------------------------------------------------------------  Indications: Pre-access. Performing Technologist: Rogelia Rohrer RVT, RDMS  Examination Guidelines: A complete evaluation includes B-mode imaging, spectral Doppler, color Doppler, and power Doppler as needed of all accessible portions of each vessel. Bilateral testing is considered an integral part of a complete examination. Limited examinations for reoccurring indications may be performed as noted. +-----------------+-------------+----------+--------+ Right Cephalic  Diameter (cm)Depth (cm)Findings +-----------------+-------------+----------+--------+ Shoulder             0.07        0.28            +-----------------+-------------+----------+--------+ Prox upper arm       0.11        0.20            +-----------------+-------------+----------+--------+ Mid upper arm        0.14        0.20            +-----------------+-------------+----------+--------+ Dist upper arm       0.10        0.27            +-----------------+-------------+----------+--------+ Antecubital fossa    0.15        0.55            +-----------------+-------------+----------+--------+ Prox forearm         0.24        0.55            +-----------------+-------------+----------+--------+ Mid forearm          0.12        0.46            +-----------------+-------------+----------+--------+ Dist forearm         0.16         0.29            +-----------------+-------------+----------+--------+ Wrist                0.13        0.25            +-----------------+-------------+----------+--------+ +-----------------+-------------+----------+---------+ Right Basilic    Diameter (cm)Depth (cm)Findings  +-----------------+-------------+----------+---------+ Prox upper arm       0.36                         +-----------------+-------------+----------+---------+ Mid upper arm        0.36                         +-----------------+-------------+----------+---------+ Dist upper arm       0.26               branching +-----------------+-------------+----------+---------+ Antecubital fossa    0.18               branching +-----------------+-------------+----------+---------+ Prox forearm         0.15                         +-----------------+-------------+----------+---------+ Mid forearm          0.14                         +-----------------+-------------+----------+---------+ Distal forearm       0.15                         +-----------------+-------------+----------+---------+ Wrist                0.13                         +-----------------+-------------+----------+---------+ +-----------------+-------------+----------+---------+ Left Cephalic    Diameter (cm)Depth (cm)Findings  +-----------------+-------------+----------+---------+ Shoulder             0.16        1.16             +-----------------+-------------+----------+---------+  Prox upper arm       0.11        0.55             +-----------------+-------------+----------+---------+ Mid upper arm        0.12        0.59             +-----------------+-------------+----------+---------+ Dist upper arm       0.19        0.74             +-----------------+-------------+----------+---------+ Antecubital fossa    0.21        0.23             +-----------------+-------------+----------+---------+  Prox forearm         0.13        0.40   branching +-----------------+-------------+----------+---------+ Mid forearm          0.11        0.29             +-----------------+-------------+----------+---------+ Dist forearm         0.22        0.24   branching +-----------------+-------------+----------+---------+ Wrist                0.15        0.29   branching +-----------------+-------------+----------+---------+ +-----------------+-------------+----------+---------+ Left Basilic     Diameter (cm)Depth (cm)Findings  +-----------------+-------------+----------+---------+ Prox upper arm       0.50                         +-----------------+-------------+----------+---------+ Mid upper arm        0.43                         +-----------------+-------------+----------+---------+ Dist upper arm       0.36                         +-----------------+-------------+----------+---------+ Antecubital fossa    0.26               branching +-----------------+-------------+----------+---------+ Prox forearm         0.23               branching +-----------------+-------------+----------+---------+ Mid forearm          0.23                         +-----------------+-------------+----------+---------+ Distal forearm       0.27                         +-----------------+-------------+----------+---------+ Wrist                0.24                         +-----------------+-------------+----------+---------+ *See table(s) above for measurements and observations.  Diagnosing physician: Deitra Mayo MD Electronically signed by Deitra Mayo MD on 03/01/2021 at 5:57:43 PM.    Final    IR TUNNELED CENTRAL VENOUS CATHETER PLACEMENT  Result Date: 02/28/2021 INDICATION: Renal failure requiring dialysis EXAM: TUNNELED CENTRAL VENOUS HEMODIALYSIS CATHETER PLACEMENT WITH ULTRASOUND AND FLUOROSCOPIC GUIDANCE MEDICATIONS: Ancef 2 gm IV . The antibiotic was  given in an appropriate time interval prior to skin puncture. ANESTHESIA/SEDATION: Moderate (conscious) sedation was employed during this procedure. A total of  Versed 3 mg and Fentanyl 100 mcg was administered intravenously. Moderate Sedation Time: 13 minutes. The patient's level of consciousness and vital signs were monitored continuously by radiology nursing throughout the procedure under my direct supervision. FLUOROSCOPY TIME:  Fluoroscopy Time: 0 minutes 18 seconds (1 mGy). COMPLICATIONS: None immediate. PROCEDURE: Informed written consent was obtained from the patient after a discussion of the risks, benefits, and alternatives to treatment. Questions regarding the procedure were encouraged and answered. The right neck and chest were prepped with chlorhexidine in a sterile fashion, and a sterile drape was applied covering the operative field. Maximum barrier sterile technique with sterile gowns and gloves were used for the procedure. A timeout was performed prior to the initiation of the procedure. The right internal jugular vein was evaluated with ultrasound and shown to be patent. A permanent ultrasound image was obtained and placed in the patient's medical record. Using sterile gel and a sterile probe cover, the right internal jugular vein was entered with a 21 ga needle during real time ultrasound guidance. 0.018 inch guidewire placed and 21 ga needle exchanged for transitional dilator set. Utilizing fluoroscopy, 0.035 inch guidewire advanced through the needle without difficulty. Seriel dilation was performed and peel-away sheath was placed. Attention then turned to the right anterior upper chest. Following local lidocaine administration, the hemodialysis catheter was tunneled from the chest wall to the venotomy site. The catheter was inserted through the peel-away sheath. The tip of the catheter was positioned within the right atrium using fluoroscopic guidance. All lumens of the catheter aspirated and  flushed well. The dialysis lumens were locked with Heparin. The catheter was secured to the skin with suture. The insertion site was covered with a Biopatch and sterile dressing. IMPRESSION: Successful placement of 23 cm tip to cuff tunneled hemodialysis catheter via the right internal jugular vein with tips terminating within the superior aspect of the right atrium. The catheter is ready for immediate use. Electronically Signed   By: Miachel Roux M.D.   On: 02/28/2021 16:15     TODAY-DAY OF DISCHARGE:  Subjective:   Rogelio Maloy today has no headache,no chest abdominal pain,no new weakness tingling or numbness, feels much better wants to go home today.   Objective:   Blood pressure (!) 167/85, pulse 86, temperature 98.3 F (36.8 C), temperature source Oral, resp. rate 17, height '6\' 5"'$  (1.956 m), weight 124.5 kg, SpO2 98 %.  Intake/Output Summary (Last 24 hours) at 03/12/2021 1349 Last data filed at 03/12/2021 1201 Gross per 24 hour  Intake 950 ml  Output 5000 ml  Net -4050 ml   Filed Weights   03/10/21 1200 03/12/21 0757 03/12/21 1221  Weight: 127.6 kg 128 kg 124.5 kg    Exam: Awake Alert, Oriented *3, No new F.N deficits, Normal affect [place].AT,PERRAL Supple Neck,No JVD, No cervical lymphadenopathy appriciated.  Symmetrical Chest wall movement, Good air movement bilaterally, CTAB RRR,No Gallops,Rubs or new Murmurs, No Parasternal Heave +ve B.Sounds, Abd Soft, Non tender, No organomegaly appriciated, No rebound -guarding or rigidity. No Cyanosis, Clubbing or edema, No new Rash or bruise   PERTINENT RADIOLOGIC STUDIES: No results found.   PERTINENT LAB RESULTS: CBC: Recent Labs    03/11/21 0220 03/12/21 0043  WBC 4.5 5.6  HGB 7.7* 7.9*  HCT 22.9* 23.7*  PLT 273 280   CMET CMP     Component Value Date/Time   NA 131 (L) 03/12/2021 0043   K 4.6 03/12/2021 0043   CL 93 (L) 03/12/2021 0043   CO2  24 03/12/2021 0043   GLUCOSE 246 (H) 03/12/2021 0043   BUN 79 (H)  03/12/2021 0043   CREATININE 11.83 (H) 03/12/2021 0043   CALCIUM 8.8 (L) 03/12/2021 0043   PROT 6.0 (L) 02/28/2021 0630   ALBUMIN 2.5 (L) 03/12/2021 0043   AST 14 (L) 02/28/2021 0630   ALT 15 02/28/2021 0630   ALKPHOS 93 02/28/2021 0630   BILITOT 0.7 02/28/2021 0630   GFRNONAA 5 (L) 03/12/2021 0043    GFR Estimated Creatinine Clearance: 13.8 mL/min (A) (by C-G formula based on SCr of 11.83 mg/dL (H)). No results for input(s): LIPASE, AMYLASE in the last 72 hours. No results for input(s): CKTOTAL, CKMB, CKMBINDEX, TROPONINI in the last 72 hours. Invalid input(s): POCBNP No results for input(s): DDIMER in the last 72 hours. No results for input(s): HGBA1C in the last 72 hours. No results for input(s): CHOL, HDL, LDLCALC, TRIG, CHOLHDL, LDLDIRECT in the last 72 hours. No results for input(s): TSH, T4TOTAL, T3FREE, THYROIDAB in the last 72 hours.  Invalid input(s): FREET3 No results for input(s): VITAMINB12, FOLATE, FERRITIN, TIBC, IRON, RETICCTPCT in the last 72 hours. Coags: No results for input(s): INR in the last 72 hours.  Invalid input(s): PT Microbiology: No results found for this or any previous visit (from the past 240 hour(s)).  FURTHER DISCHARGE INSTRUCTIONS:  Get Medicines reviewed and adjusted: Please take all your medications with you for your next visit with your Primary MD  Laboratory/radiological data: Please request your Primary MD to go over all hospital tests and procedure/radiological results at the follow up, please ask your Primary MD to get all Hospital records sent to his/her office.  In some cases, they will be blood work, cultures and biopsy results pending at the time of your discharge. Please request that your primary care M.D. goes through all the records of your hospital data and follows up on these results.  Also Note the following: If you experience worsening of your admission symptoms, develop shortness of breath, life threatening emergency,  suicidal or homicidal thoughts you must seek medical attention immediately by calling 911 or calling your MD immediately  if symptoms less severe.  You must read complete instructions/literature along with all the possible adverse reactions/side effects for all the Medicines you take and that have been prescribed to you. Take any new Medicines after you have completely understood and accpet all the possible adverse reactions/side effects.   Do not drive when taking Pain medications or sleeping medications (Benzodaizepines)  Do not take more than prescribed Pain, Sleep and Anxiety Medications. It is not advisable to combine anxiety,sleep and pain medications without talking with your primary care practitioner  Special Instructions: If you have smoked or chewed Tobacco  in the last 2 yrs please stop smoking, stop any regular Alcohol  and or any Recreational drug use.  Wear Seat belts while driving.  Please note: You were cared for by a hospitalist during your hospital stay. Once you are discharged, your primary care physician will handle any further medical issues. Please note that NO REFILLS for any discharge medications will be authorized once you are discharged, as it is imperative that you return to your primary care physician (or establish a relationship with a primary care physician if you do not have one) for your post hospital discharge needs so that they can reassess your need for medications and monitor your lab values.  Total Time spent coordinating discharge including counseling, education and face to face time equals 35 minutes.  Signed:  Teshia Mahone 03/12/2021 1:49 PM

## 2021-03-12 NOTE — TOC Transition Note (Signed)
Transition of Care [facility]) - CM/SW Discharge Note   Patient Details  Name: Camden Koeth MRN: [id number] Date of Birth: [birth year]  Transition of Care [facility]) CM/SW Contact:  Carles Collet, RN Phone Number: 03/12/2021, 2:03 PM   Clinical Narrative:    Spoke w patient on the phone to go over DC.  When confirming that he did not have insurance coverage to ascertain supportive services available he stated that he had Medicaid. He stated that he had a card at home. He stated that he had medication coverage through Medicaid.  I looked up the out of pocket cost of his meds at [facility], just in case, and they were all on the $4 list except Norvasc which was $9.  Follow up has been made at [facility], and he confirms that he has transportation to HD, we reviewed dates and times and to be there early on Tuesday at 11:00. No other CM needs identified.     Final next level of care: Home/Self Care Barriers to Discharge: No Barriers Identified   Patient Goals and CMS Choice Patient states their goals for this hospitalization and ongoing recovery are:: to go home      Discharge Placement                       Discharge Plan and Services                DME Arranged: N/A           HH Agency: NA        Social Determinants of Health (SDOH) Interventions     Readmission Risk Interventions Readmission Risk Prevention Plan 03/11/2021  Transportation Screening Complete  PCP or Specialist Appt within 5-7 Days Not Complete  Not Complete comments 03/29/21 is first available appt  Home Care Screening Complete

## 2021-03-12 NOTE — Progress Notes (Signed)
[facility] NEPHROLOGY PROGRESS NOTE  Assessment/ Plan: Pt is a 25 y.o. yo male  with history of type 1 diabetes since very young age, retinopathy, uncontrolled hypertension on metoprolol, CKD 3 and was followed by nephrologist in [facility] with creatinine level 2.10 in 09/2019, presented with severe nausea, vomiting, decreased appetite and weakness for about 6 weeks, seen as a consultation for the management of advanced renal failure. SBP >200 and creatinine level around 26.  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  The creatinine level was around 2 back in 2021 and was seen by nephrologist in [facility]  He has uncontrolled diabetes and hypertension with poor follow-up.  Now he presents with around 6 weeks of uremic symptoms with creatinine level peaked at 26.  The CT scan ruled out hydronephrosis/obstruction. Urinalysis with proteinuria UPC 3.7 and microscopic hematuria.  Hep B, hep C, HIV, ANCA, SPEP negative.  ANA positive with unremarkable complements and antidsDNA negative.  Follow-up anti-GBM. 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 I am not sure how much benefit he will get with kidney biopsy at this time, he probably has a lot of chronic finding/scarring and I believe that there will not be much change in his current management. Offered biopsy to [place] and at this time he would like to hold off on any further procedures especially given that his treatment would still likely stay the same. Of note, he has had nephrotic range proteinuria even back in 2020 (UPC 4.7g, result in careeverywhere) hence expecting to see chronic changes -The AV fistula was placed by Dr. Donnetta Hutching on 7/1 and has good thrill and bruit. -clip for outpatient hd placement: tts [facility], chair time 03/15/21 11:40am -Will keep on TTS sched for HD, HD today  Hypertensive urgency with  renal failure:  -on amlodipine, imdur, clondine, coreg -BP still above goal, but improved with increasing uf goals. Will aim to UF more today. Increasing imdur to '60mg'$   Metabolic acidosis with CKD: resolved. Now managed with dialysis.    Anemia of chronic kidney disease: Iron saturation 39% with serum iron 92. On esa, iv iron resumed, 1u prbc 7/7   Secondary hyperparathyroidism: PTH 259 at goal for CKD.  Started PhosLo for hyperphosphatemia. Renal diet  Hyponatremia, stable/improved -inc'ing UF goals, managing with HD on 137Na bath  Subjective: Seen and examined at bedside.  No acute events. Going home after HD today. Has chair time for this coming tues at [facility]. Eager to go home  Objective Vital signs in last 24 hours: Vitals:   03/12/21 0139 03/12/21 0155 03/12/21 0159 03/12/21 0309  BP: (!) 161/70   (!) 154/85  Pulse:      Resp: (!) 26 (!) '21 15 18  '$ Temp:    98.5 F (36.9 C)  TempSrc:    Oral  SpO2:    98%  Weight:      Height:       Weight change:   Intake/Output Summary (Last 24 hours) at 03/12/2021 0825 Last data filed at 03/12/2021 0300 Gross per 24 hour  Intake 1550 ml  Output --  Net 1550 ml       Labs: Basic Metabolic Panel: Recent Labs  Lab 03/10/21 0301 03/11/21 0220 03/12/21 0043  NA 130* 131* 131*  K 5.3* 4.6 4.6  CL 92* 93* 93*  CO2 '24 27 24  '$ GLUCOSE 172* 215* 246*  BUN  92* 58* 79*  CREATININE 14.08* 10.02* 11.83*  CALCIUM 8.6* 8.6* 8.8*  PHOS 7.8* 6.3* 6.0*   Liver Function Tests: Recent Labs  Lab 03/10/21 0301 03/11/21 0220 03/12/21 0043  ALBUMIN 2.4* 2.4* 2.5*   No results for input(s): LIPASE, AMYLASE in the last 168 hours. No results for input(s): AMMONIA in the last 168 hours. CBC: Recent Labs  Lab 03/07/21 0800 03/09/21 1001 03/10/21 0301 03/11/21 0220 03/12/21 0043  WBC 6.4 4.7 4.0 4.5 5.6  HGB 7.1* 7.2* 6.8* 7.7* 7.9*  HCT 22.2* 22.8* 20.9* 22.9* 23.7*  MCV 85.7 83.8 83.9 83.3 84.0  PLT 246 288 286 273 280    Cardiac Enzymes: No results for input(s): CKTOTAL, CKMB, CKMBINDEX, TROPONINI in the last 168 hours.  CBG: Recent Labs  Lab 03/11/21 0758 03/11/21 1214 03/11/21 1658 03/11/21 2122 03/12/21 0653  GLUCAP 158* 224* 194* 240* 183*    Iron Studies:  No results for input(s): IRON, TIBC, TRANSFERRIN, FERRITIN in the last 72 hours.  Studies/Results: No results found.  Medications: Infusions:  sodium chloride     sodium chloride     ferric gluconate (FERRLECIT) IVPB 125 mg (03/10/21 1507)      Scheduled Medications:  amLODipine  10 mg Oral Daily   calcium acetate  1,334 mg Oral TID WC   carvedilol  25 mg Oral BID WC   Chlorhexidine Gluconate Cloth  6 each Topical Q0600   cloNIDine  0.3 mg Oral TID   darbepoetin (ARANESP) injection - DIALYSIS  60 mcg Intravenous Q Sat-HD   diphenhydrAMINE  25 mg Intravenous Once   feeding supplement (NEPRO CARB STEADY)  237 mL Oral BID BM   heparin injection (subcutaneous)  5,000 Units Subcutaneous Q8H   insulin aspart  0-6 Units Subcutaneous TID WC   isosorbide mononitrate  30 mg Oral Daily   kidney failure book   Does not apply Once   multivitamin  1 tablet Oral QHS   polyethylene glycol  17 g Oral Daily   senna-docusate  2 tablet Oral QHS    have reviewed scheduled and prn medications.  Physical Exam: General:NAD, comfortable Heart:RRR, s1s2 nl Lungs: normal wob Abdomen:soft, Non-tender, non-distended Extremities: No LE edema Neuro: awake, alert, moves all ext spontaneously, speech clear and coherent Dialysis Access: Right IJ TDC, left AV fistula site clean, without bleeding, has good thrill and bruit.  Bobby [name] 03/12/2021,8:25 AM  LOS: 12 days

## 2021-03-12 NOTE — Progress Notes (Signed)
Tele called and noted ST elevations of -2.0 on monitor, assessed pt, denied any chest pain. MD made aware and EKG obtained. No new orders.

## 2021-03-14 DIAGNOSIS — E441 Mild protein-calorie malnutrition: Secondary | ICD-10-CM | POA: Insufficient documentation

## 2021-03-17 DIAGNOSIS — M6281 Muscle weakness (generalized): Secondary | ICD-10-CM | POA: Insufficient documentation

## 2021-03-29 ENCOUNTER — Telehealth (INDEPENDENT_AMBULATORY_CARE_PROVIDER_SITE_OTHER): Payer: Medicaid Other | Admitting: Primary Care

## 2021-03-29 DIAGNOSIS — N2581 Secondary hyperparathyroidism of renal origin: Secondary | ICD-10-CM | POA: Insufficient documentation

## 2021-04-22 ENCOUNTER — Ambulatory Visit (INDEPENDENT_AMBULATORY_CARE_PROVIDER_SITE_OTHER): Payer: Medicaid Other | Admitting: Physician Assistant

## 2021-04-22 ENCOUNTER — Other Ambulatory Visit: Payer: Self-pay

## 2021-04-22 VITALS — BP 131/76 | HR 76 | Temp 98.0°F | Resp 20 | Ht 77.0 in | Wt 268.2 lb

## 2021-04-22 DIAGNOSIS — Z992 Dependence on renal dialysis: Secondary | ICD-10-CM

## 2021-04-22 DIAGNOSIS — N186 End stage renal disease: Secondary | ICD-10-CM

## 2021-04-22 NOTE — Progress Notes (Signed)
    Postoperative Access Visit   History of Present Illness   Bobby [name] is a 25 y.o. year old male who presents for postoperative follow-up for: left single stage basilic vein transposition by Dr. Donnetta Hutching (Date: 03/08/21).  The patient's wounds are healed.  The patient denies steal symptoms.  The patient is able to complete their activities of daily living.  He is currently dialyzing via R IJ TDC on a TThS schedule in [employer].   Physical Examination   Vitals:   04/22/21 0857  BP: 131/76  Pulse: 76  Resp: 20  Temp: 98 F (36.7 C)  TempSrc: Temporal  SpO2: 100%  Weight: 268 lb 3.2 oz (121.7 kg)  Height: '6\' 5"'$  (1.956 m)   Body mass index is 31.8 kg/m.  left arm Incisions are healed, palpable radial pulse, hand grip is 5/5, sensation in digits is intact, palpable thrill, bruit can  be auscultated     Medical Decision Making   Bobby [name] is a 25 y.o. year old male who presents s/p left single stage basilic vein transposition  Patent basilic vein fistula without signs or symptoms of steal syndrome The patient's access will be ready for use 05/10/21 The patient's tunneled dialysis catheter can be removed when Nephrology is comfortable with the performance of the fistula The patient may follow up on a prn basis   Bobby Ligas PA-C [facility] Office: [phone]  Clinic MD: Donzetta Matters

## 2021-07-17 ENCOUNTER — Encounter (HOSPITAL_BASED_OUTPATIENT_CLINIC_OR_DEPARTMENT_OTHER): Payer: Self-pay | Admitting: *Deleted

## 2021-07-17 ENCOUNTER — Other Ambulatory Visit: Payer: Self-pay

## 2021-07-17 ENCOUNTER — Emergency Department (HOSPITAL_BASED_OUTPATIENT_CLINIC_OR_DEPARTMENT_OTHER)
Admission: EM | Admit: 2021-07-17 | Discharge: 2021-07-17 | Disposition: A | Discharge status: Home / Self Care | Payer: Medicaid Other | Attending: Emergency Medicine | Admitting: Emergency Medicine

## 2021-07-17 DIAGNOSIS — Z79899 Other long term (current) drug therapy: Secondary | ICD-10-CM | POA: Insufficient documentation

## 2021-07-17 DIAGNOSIS — N183 Chronic kidney disease, stage 3 unspecified: Secondary | ICD-10-CM | POA: Insufficient documentation

## 2021-07-17 DIAGNOSIS — Z041 Encounter for examination and observation following transport accident: Secondary | ICD-10-CM | POA: Diagnosis present

## 2021-07-17 DIAGNOSIS — E1022 Type 1 diabetes mellitus with diabetic chronic kidney disease: Secondary | ICD-10-CM | POA: Insufficient documentation

## 2021-07-17 DIAGNOSIS — Z794 Long term (current) use of insulin: Secondary | ICD-10-CM | POA: Diagnosis not present

## 2021-07-17 DIAGNOSIS — I129 Hypertensive chronic kidney disease with stage 1 through stage 4 chronic kidney disease, or unspecified chronic kidney disease: Secondary | ICD-10-CM | POA: Diagnosis not present

## 2021-07-17 DIAGNOSIS — Z87891 Personal history of nicotine dependence: Secondary | ICD-10-CM | POA: Diagnosis not present

## 2021-07-17 DIAGNOSIS — Y92410 Unspecified street and highway as the place of occurrence of the external cause: Secondary | ICD-10-CM | POA: Diagnosis not present

## 2021-07-17 MED ORDER — CYCLOBENZAPRINE HCL 10 MG PO TABS: 10.0000 mg | ORAL_TABLET | Freq: Two times a day (BID) | ORAL | 0 refills | Status: DC | PRN
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
## 2021-07-17 NOTE — Discharge Instructions (Signed)
Take Flexeril up to twice daily before bed as needed for muscle relaxing.  Do not take it during the day as it can impair your driving to make you drowsy.  Take Tylenol and Motrin as needed for pain.  Return to the ED if you start having new symptoms such as nausea, vomiting, vision change, severe headache.
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
## 2021-08-07 ENCOUNTER — Encounter (HOSPITAL_BASED_OUTPATIENT_CLINIC_OR_DEPARTMENT_OTHER): Payer: Self-pay

## 2021-08-07 ENCOUNTER — Other Ambulatory Visit: Payer: Self-pay

## 2021-08-07 ENCOUNTER — Emergency Department (HOSPITAL_BASED_OUTPATIENT_CLINIC_OR_DEPARTMENT_OTHER)
Admission: EM | Admit: 2021-08-07 | Discharge: 2021-08-07 | Disposition: A | Discharge status: Home / Self Care | Payer: Medicaid Other | Attending: Emergency Medicine | Admitting: Emergency Medicine

## 2021-08-07 DIAGNOSIS — E1122 Type 2 diabetes mellitus with diabetic chronic kidney disease: Secondary | ICD-10-CM | POA: Insufficient documentation

## 2021-08-07 DIAGNOSIS — Z794 Long term (current) use of insulin: Secondary | ICD-10-CM | POA: Diagnosis not present

## 2021-08-07 DIAGNOSIS — Z87891 Personal history of nicotine dependence: Secondary | ICD-10-CM | POA: Insufficient documentation

## 2021-08-07 DIAGNOSIS — N183 Chronic kidney disease, stage 3 unspecified: Secondary | ICD-10-CM | POA: Diagnosis not present

## 2021-08-07 DIAGNOSIS — H5712 Ocular pain, left eye: Secondary | ICD-10-CM | POA: Insufficient documentation

## 2021-08-07 DIAGNOSIS — Z79899 Other long term (current) drug therapy: Secondary | ICD-10-CM | POA: Insufficient documentation

## 2021-08-07 DIAGNOSIS — I129 Hypertensive chronic kidney disease with stage 1 through stage 4 chronic kidney disease, or unspecified chronic kidney disease: Secondary | ICD-10-CM | POA: Insufficient documentation

## 2021-08-07 MED ORDER — FLUORESCEIN SODIUM 1 MG OP STRP
1.0000 | ORAL_STRIP | Freq: Once | OPHTHALMIC | Status: AC
  Administered 2021-08-07: 13:00:00 1 via OPHTHALMIC
  Filled 2021-08-07: qty 1

## 2021-08-07 MED ORDER — TETRACAINE HCL 0.5 % OP SOLN
2.0000 [drp] | Freq: Once | OPHTHALMIC | Status: AC
  Administered 2021-08-07: 13:00:00 2 [drp] via OPHTHALMIC
  Filled 2021-08-07: qty 4

## 2021-08-07 NOTE — Discharge Instructions (Signed)
Continue eyedrops as prescribed.  Follow-up with eye doctor tomorrow morning at 9 AM.

## 2021-08-07 NOTE — ED Triage Notes (Signed)
Left eye redness, pain and itching since thursday. C/o blurred vision

## 2021-08-07 NOTE — ED Provider Notes (Signed)
[facility] EMERGENCY DEPARTMENT Provider Note   CSN: [id number] Arrival date & time: 08/07/21  1141     History Chief Complaint  Patient presents with   Eye Pain    Rakan Devincenzi is a 25 y.o. male.  The history is provided by the patient.  Eye Pain This is a new problem. The current episode started more than 2 days ago. The problem occurs daily. The problem has not changed since onset.Pertinent negatives include no chest pain, no abdominal pain, no headaches and no shortness of breath. Nothing aggravates the symptoms. Nothing relieves the symptoms. He has tried nothing for the symptoms. The treatment provided no relief.      Past Medical History:  Diagnosis Date   Diabetes mellitus without complication ([place])    Hypertension     Patient Active Problem List   Diagnosis Date Noted   Secondary hyperparathyroidism of renal origin ([place]) 03/29/2021   Muscle weakness (generalized) 03/17/2021   Mild protein-calorie malnutrition ([place]) 03/14/2021   Allergy, unspecified, initial encounter 03/08/2021   Anaphylactic shock, unspecified, initial encounter 03/08/2021   Coagulation defect, unspecified ([place]) 03/08/2021   Dependence on renal dialysis ([place]) 03/08/2021   Encounter for immunization 03/08/2021   Iron deficiency anemia, unspecified 03/08/2021   Rhabdomyolysis 02/28/2021   Hypertensive emergency without congestive heart failure 02/28/2021   Uremia 02/28/2021   Prolonged QT interval 02/28/2021   Anemia due to chronic kidney disease 02/28/2021   Nausea & vomiting 02/28/2021   Dehydration 02/28/2021   Diabetes 1.5, 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   Hypertensive urgency 02/28/2021   AKI (acute kidney injury) ([place]) 02/28/2021   Acute renal failure superimposed on chronic kidney disease ([place]) 02/27/2021   Leg swelling 12/01/2019   Persistent proteinuria 12/01/2019   Essential hypertension 06/24/2019   Uninsured 06/24/2019   Hx of noncompliance with medical  treatment, presenting hazards to health 06/06/2019   Chronic kidney disease, stage 3 ([place]) 06/05/2019    Past Surgical History:  Procedure Laterality Date   AV FISTULA PLACEMENT Left 03/04/2021   Procedure: Basilic Vein Transposition Left upper arm fistula;  Surgeon: Rosetta Posner, MD;  Location: [facility] OR;  Service: Vascular;  Laterality: Left;  PERIPHERAL NERVE BLOCK   IR PERC TUN PERIT CATH WO PORT S&I /IMAG  02/28/2021   IR US GUIDE VASC ACCESS RIGHT  02/28/2021       Family History  Problem Relation Age of Onset   Diabetes Mother    Kidney disease Other    Hypertension Other     Social History   Tobacco Use   Smoking status: Former    Types: Cigarettes   Smokeless tobacco: Never  Substance Use Topics   Alcohol use: Never   Drug use: Yes    Types: Marijuana    Comment: daily    Home Medications Prior to Admission medications   Medication Sig Start Date End Date Taking? Authorizing Provider  amLODipine (NORVASC) 10 MG tablet Take 1 tablet (10 mg total) by mouth daily. 03/13/21   Ghimire, Henreitta Leber, MD  carvedilol (COREG) 25 MG tablet Take 1 tablet (25 mg total) by mouth 2 (two) times daily with a meal. 03/12/21   Ghimire, Henreitta Leber, MD  cloNIDine (CATAPRES) 0.3 MG tablet Take 1 tablet (0.3 mg total) by mouth 3 (three) times daily. 03/12/21   Ghimire, Henreitta Leber, MD  cyclobenzaprine (FLEXERIL) 10 MG tablet Take 1 tablet (10 mg total) by mouth 2 (two) times daily as needed for muscle spasms. 07/17/21  Sherrill Raring, PA-C  Doxercalciferol (HECTOROL IV) Doxercalciferol (Hectorol) 03/31/21 03/30/22  [provider]  heparin 1000 unit/mL SOLN injection Heparin Sodium (Porcine) 1,000 Units/mL Catheter Lock Arterial 03/10/21 03/09/22  [provider]  insulin aspart (NOVOLOG) 100 UNIT/ML injection Inject 5 Units into the skin 3 (three) times daily before meals. 03/12/21   Ghimire, Henreitta Leber, MD  iron sucrose in sodium chloride 0.9 % 100 mL Iron Sucrose (Venofer) 04/12/21 04/11/22   [provider]  isosorbide mononitrate (IMDUR) 60 MG 24 hr tablet Take 1 tablet (60 mg total) by mouth daily. 03/12/21   Ghimire, Henreitta Leber, MD  Methoxy PEG-Epoetin Beta (MIRCERA IJ) Mircera 03/17/21 03/16/22  [provider]  sevelamer carbonate (RENVELA) 800 MG tablet Take by mouth. 04/05/21   [provider]    Allergies    Patient has no known allergies.  Review of Systems   Review of Systems  Eyes:  Positive for pain, redness, itching and visual disturbance. Negative for photophobia and discharge.  Respiratory:  Negative for shortness of breath.   Cardiovascular:  Negative for chest pain.  Gastrointestinal:  Negative for abdominal pain.  Neurological:  Negative for headaches.   Physical Exam Updated Vital Signs  ED Triage Vitals  Enc Vitals Group     BP 08/07/21 1148 (S) (!) 194/109     Pulse Rate 08/07/21 1147 92     Resp 08/07/21 1147 18     Temp 08/07/21 1147 (!) 97.5 F (36.4 C)     Temp Source 08/07/21 1147 Oral     SpO2 08/07/21 1148 95 %     Weight 08/07/21 1146 217 lb (98.4 kg)     Height 08/07/21 1146 6\' 5"  (1.956 m)     Head Circumference --      Peak Flow --      Pain Score 08/07/21 1146 7     Pain Loc --      Pain Edu? --      Excl. in [place]? --      Physical Exam Constitutional:      General: He is not in acute distress.    Appearance: He is not ill-appearing.  HENT:     Head: Normocephalic and atraumatic.     Mouth/Throat:     Mouth: Mucous membranes are moist.  Eyes:     Extraocular Movements: Extraocular movements intact.     Pupils: Pupils are equal, round, and reactive to light.     Comments: Fluorescein uptake negative, unable to check the pressure in his eye as patient cannot tolerate it, visual acuity appears to be intact, he has conjunctival inflammation in the left eye but he has normal extraocular movements, pupils equal reactive bilaterally, he does have consensual photophobia  Musculoskeletal:     Cervical back:  Normal range of motion.  Neurological:     Mental Status: He is alert.    ED Results / Procedures / Treatments   Labs (all labs ordered are listed, but only abnormal results are displayed) Labs Reviewed - No data to display  EKG None  Radiology No results found.  Procedures Procedures   Medications Ordered in ED Medications  fluorescein ophthalmic strip 1 strip (has no administration in time range)  tetracaine (PONTOCAINE) 0.5 % ophthalmic solution 2 drop (has no administration in time range)    ED Course  I have reviewed the triage vital signs and the nursing notes.  Pertinent labs & imaging results that were available during my care of the  patient were reviewed by me and considered in my medical decision making (see chart for details).    MDM Rules/Calculators/A&P                           Domanik Rainville is here with left eye pain.  Unremarkable vitals except for hypertension.  Patient with history of chronic kidney disease, high blood pressure, diabetes.  Has been having left thigh pain with photophobia for the last 3 days.  Has been on eyedrops with not much improvement.  Overall he has injected conjunctiva of the left eye.  Pupil is equal and reactive bilaterally, he has consensual photophobia.  He will not allow me to check the eye pressure in his eye as on multiple attempts patient cannot tolerate it.  No fluorescein uptake.  I am unable to completely rule out glaucoma and patient understands this.  However, I did contact Dr. Katy Fitch with ophthalmology who will see the patient tomorrow morning at 9 AM for eye exam.  My suspicion is that this is likely iritis but he is high risk for glaucoma.  Patient understands risks benefits but prefers to wait for eye appointment tomorrow for further evaluation.  I think that this is reasonable.  Discharged in good condition.  This chart was dictated using voice recognition software.  Despite best efforts to proofread,  errors can  occur which can change the documentation meaning.   Final Clinical Impression(s) / ED Diagnoses Final diagnoses:  Left eye pain    Rx / DC Orders ED Discharge Orders     None        Lennice Sites, DO 08/07/21 1233

## 2021-08-10 ENCOUNTER — Encounter (HOSPITAL_COMMUNITY): Payer: Self-pay | Admitting: Vascular Surgery

## 2021-08-10 ENCOUNTER — Encounter (HOSPITAL_COMMUNITY): Payer: Self-pay | Admitting: Ophthalmology

## 2021-08-10 ENCOUNTER — Other Ambulatory Visit: Payer: Self-pay

## 2021-08-10 NOTE — Progress Notes (Addendum)
Anesthesia Chart Review: SAME DAY WORK-UP  Case: [id number] Date/Time: 08/11/21 1350   Procedures:      PARS PLANA VITRECTOMY WITH 25 GAUGE (Left)     PHOTOCOAGULATION WITH LASER (Left)     INSERTION OF AHMED VALVE (Left)     BEVACIZUMAB INJECTIONL (Left)   Anesthesia type: General   Pre-op diagnosis: PROLIFERATIVE DIABETIC RETINOPATHY,VITROUS HEMORRHAGE,NEOVASCULAR GLAUCOMA   Location: [facility] OR ROOM 08 / [facility] OR   Surgeons: Sherlynn Stalls, MD       DISCUSSION: Patient is a 25 year old male scheduled for the above procedure.  History includes former smoker, DM1, HTN, ESRD (hemodialysis started 02/28/21).  - [facility] admission 02/27/21-03/12/21 for N/V, weakness, poor appetite, hypertensive urgency and found to have progressive CKD 3b to ESRD.  Normal head CT.  Right IJ TDC placed by IR 02/28/21. S/p left basilic vein transposition AVF 03/04/21. Transfused 1 unit PRBC for anemia.   - ED visit 08/07/21 for left eye pain. Patient intolerant to ED provider attempts at checking eye pressure. No fluorescein uptake. Ophthalmologist contacted and out-patient visit arranged. Now scheduled for above procedure. Of note, BP elevated at 194/109 in the ED. Previously 188/121 on 07/17/21, 146/74 on 07/06/21 ([facility]), 131/76 on 04/22/21.  PAT RN called and spoke with patient. He reported full HD session on 08/09/21 (TTS schedule). He said he does not have a PCP yet, but is compliant with his medications and hemodialysis. DM medications includes TID Novolog, and HTN medications include clonidine, Imdur, amlodipine, Coreg. He is a same day work-up, so labs and anesthesia team evaluation on the day of surgery.    VS: On arrival. See DISCUSSION.    PROVIDERS: Otelia Santee, MD is nephrologist. Patient had initial evaluation for kidney and/or pancreas transplant with [facility] on 07/06/21. Work-up is on-going.   LABS: For labs on arrival. As of 07/06/21 ([facility]): Cr 11.49, Na 134, K 5.0, glucose 344, total protein 7.6,  albumin 4.2, alkaline phosphatase 105, AST 14, ALT 18, phosphorus 7.4, A1c 8.8%, hemoglobin 10.7, hematocrit 32.4, platelet count 256.   IMAGES: CXR 07/06/21 ([facility]): FINDINGS:  Cardiovascular/mediastinum: Normal cardiomediastinal silhouette and pulmonary vasculature.  Lungs/pleura: Low lung volumes. No pleural effusions. No discernible pneumothorax. No focal opacities.  Upper abdomen: Visualized portions are unremarkable.  Chest wall/osseous structures: Unremarkable.  CONCLUSION:  There is no evidence of acute cardiac or pulmonary abnormality.   EKG: 03/12/21: Sinus rhythm with occasional Premature ventricular complexes T wave abnormality, consider lateral ischemia Abnormal ECG Confirmed by Cherlynn Kaiser [phone]) on 03/12/2021 3:12:44 PM   CV: Echo 06/09/19 ([facility]): Summary  Normal left ventricular size and systolic function with no appreciable  segmental abnormality.  Ejection fraction is visually estimated at 70%  Mild concentric left ventricular hypertrophy  Normal LV diastolic function  Normal right ventricular size and function.  No significant valvular stenosis or regurgitation.  The IVC is dilated, consistent with elevated central venous pressure.    Past Medical History:  Diagnosis Date   Diabetes mellitus without complication ([place])    Type I   ESRD (end stage renal disease) ([place])    hemodialysis initiated 02/28/21   Hypertension     Past Surgical History:  Procedure Laterality Date   AV FISTULA PLACEMENT Left 03/04/2021   Procedure: Basilic Vein Transposition Left upper arm fistula;  Surgeon: Rosetta Posner, MD;  Location: [facility] OR;  Service: Vascular;  Laterality: Left;  PERIPHERAL NERVE BLOCK   IR PERC TUN PERIT CATH WO PORT S&I [facility]  02/28/2021  IR US GUIDE VASC ACCESS RIGHT  02/28/2021    MEDICATIONS: No current facility-administered medications for this encounter.    amLODipine (NORVASC) 10 MG tablet   carvedilol (COREG) 25 MG tablet   cloNIDine  (CATAPRES) 0.3 MG tablet   COMBIGAN 0.2-0.5 % ophthalmic solution   cyclobenzaprine (FLEXERIL) 10 MG tablet   Doxercalciferol (HECTOROL IV)   erythromycin ophthalmic ointment   heparin 1000 unit/mL SOLN injection   insulin aspart (NOVOLOG) 100 UNIT/ML injection   iron sucrose in sodium chloride 0.9 % 100 mL   isosorbide mononitrate (IMDUR) 60 MG 24 hr tablet   latanoprost (XALATAN) 0.005 % ophthalmic solution   Methoxy PEG-Epoetin Beta (MIRCERA IJ)   ofloxacin (OCUFLOX) 0.3 % ophthalmic solution    Myra Gianotti, PA-C Surgical Short Stay/Anesthesiology [facility] Phone [phone] [facility] Phone [phone] 08/10/2021 3:30 PM

## 2021-08-10 NOTE — Progress Notes (Signed)
PCP - Denies  Cardiologist - Denies  EP-Denies  Endocrine-Denies  Pulm-Denies  Chest x-ray - Denies  EKG - 03/12/21 (E)  Stress Test - Denies  ECHO - Denies  Cardiac Cath - Denies  AICD-na PM-na LOOP-na  Dialysis- T-Th-Sat- pt states he had a full session on 08/09/21  Sleep Study - Denies CPAP - Denies  LABS- 08/11/21: I-Stat 8  ASA-Denies  ERAS- Yes until [place]- 07/06/21 ([facility]): 8.8 Fasting Blood Sugar - unknown Checks Blood Sugar _3-4____ times a day  Anesthesia- Yes, elevated A1C  Pt denies having chest pain, sob, or fever during the pre-op phone call. All instructions explained to the pt, with a verbal understanding of the material including: as of today, stop taking all Aspirin (unless instructed by your doctor) and Other Aspirin containing products, Vitamins, Fish oils, and Herbal medications. Also stop all NSAIDS i.e. Advil, Ibuprofen, Motrin, Aleve, Anaprox, Naproxen, BC, Goody Powders, and all Supplements.    WHAT DO I DO ABOUT MY DIABETES MEDICATION?  If your CBG is greater than 220 mg/dL, you may take ()  2.5 units of Novolog Insulin.  How do I manage my blood sugar before surgery? Check your blood sugar at least 4 times a day, starting 2 days before surgery, to make sure that the level is not too high or low. Check your blood sugar the morning of your surgery when you wake up and every 2 hours until you get to the Short Stay unit. If your blood sugar is less than 70 mg/dL, you will need to treat for low blood sugar: Do not take insulin. Treat a low blood sugar (less than 70 mg/dL) with  cup of clear juice (cranberry or apple), 4 glucose tablets, OR glucose gel. Recheck blood sugar in 15 minutes after treatment (to make sure it is greater than 70 mg/dL). If your blood sugar is not greater than 70 mg/dL on recheck, call [phone]  for further instructions. Report your blood sugar to the short stay nurse when you get to Short Stay.   Reviewed  and Endorsed by [facility] Patient Education Committee, August 2015  Pt also instructed to wear a mask and social distance if he goes out. The opportunity to ask questions was provided.    Coronavirus Screening  Have you experienced the following symptoms:  Cough yes/no: No Fever (>100.40F)  yes/no: No Runny nose yes/no: No Sore throat yes/no: No Difficulty breathing/shortness of breath  yes/no: No  Have you or a family member traveled in the last 14 days and where? yes/no: No   If the patient indicates "YES" to the above questions, their PAT will be rescheduled to limit the exposure to others and, the surgeon will be notified. THE PATIENT WILL NEED TO BE ASYMPTOMATIC FOR 14 DAYS.   If the patient is not experiencing any of these symptoms, the PAT nurse will instruct them to NOT bring anyone with them to their appointment since they may have these symptoms or traveled as well.   Please remind your patients and families that hospital visitation restrictions are in effect and the importance of the restrictions.

## 2021-08-10 NOTE — Anesthesia Preprocedure Evaluation (Deleted)
Anesthesia Evaluation    Airway        Dental   Pulmonary former smoker,           Cardiovascular hypertension,      Neuro/Psych    GI/Hepatic   Endo/Other  diabetes  Renal/GU      Musculoskeletal   Abdominal   Peds  Hematology   Anesthesia Other Findings   Reproductive/Obstetrics                             Anesthesia Physical Anesthesia Plan  ASA:   Anesthesia Plan:    Post-op Pain Management:    Induction:   PONV Risk Score and Plan:   Airway Management Planned:   Additional Equipment:   Intra-op Plan:   Post-operative Plan:   Informed Consent:   Plan Discussed with:   Anesthesia Plan Comments: (PAT note written 08/10/2021 by Breckin Zafar, PA-C. )        Anesthesia Quick Evaluation  

## 2021-08-11 ENCOUNTER — Encounter (HOSPITAL_COMMUNITY)
Admission: RE | Disposition: A | Discharge status: Home / Self Care | Payer: Self-pay | Source: Home / Self Care | Attending: Ophthalmology

## 2021-08-11 ENCOUNTER — Other Ambulatory Visit: Payer: Self-pay | Admitting: Ophthalmology

## 2021-08-11 ENCOUNTER — Ambulatory Visit (HOSPITAL_COMMUNITY)
Admission: RE | Admit: 2021-08-11 | Discharge: 2021-08-11 | Disposition: A | Discharge status: Home / Self Care | Payer: Medicaid Other | Attending: Ophthalmology | Admitting: Ophthalmology

## 2021-08-11 ENCOUNTER — Other Ambulatory Visit: Payer: Self-pay

## 2021-08-11 DIAGNOSIS — E113592 Type 2 diabetes mellitus with proliferative diabetic retinopathy without macular edema, left eye: Secondary | ICD-10-CM | POA: Diagnosis not present

## 2021-08-11 DIAGNOSIS — Z992 Dependence on renal dialysis: Secondary | ICD-10-CM | POA: Diagnosis not present

## 2021-08-11 DIAGNOSIS — N186 End stage renal disease: Secondary | ICD-10-CM | POA: Diagnosis not present

## 2021-08-11 DIAGNOSIS — E1022 Type 1 diabetes mellitus with diabetic chronic kidney disease: Secondary | ICD-10-CM | POA: Insufficient documentation

## 2021-08-11 DIAGNOSIS — Z538 Procedure and treatment not carried out for other reasons: Secondary | ICD-10-CM | POA: Insufficient documentation

## 2021-08-11 DIAGNOSIS — Z794 Long term (current) use of insulin: Secondary | ICD-10-CM | POA: Insufficient documentation

## 2021-08-11 DIAGNOSIS — H4089 Other specified glaucoma: Secondary | ICD-10-CM | POA: Insufficient documentation

## 2021-08-11 DIAGNOSIS — I120 Hypertensive chronic kidney disease with stage 5 chronic kidney disease or end stage renal disease: Secondary | ICD-10-CM | POA: Insufficient documentation

## 2021-08-11 DIAGNOSIS — Z79899 Other long term (current) drug therapy: Secondary | ICD-10-CM | POA: Diagnosis not present

## 2021-08-11 DIAGNOSIS — H4312 Vitreous hemorrhage, left eye: Secondary | ICD-10-CM | POA: Diagnosis not present

## 2021-08-11 HISTORY — DX: End stage renal disease: N18.6

## 2021-08-11 LAB — POCT I-STAT, CHEM 8
BUN: 78 mg/dL — ABNORMAL HIGH (ref 6–20)
Calcium, Ion: 0.97 mmol/L — ABNORMAL LOW (ref 1.15–1.40)
Chloride: 93 mmol/L — ABNORMAL LOW (ref 98–111)
Creatinine, Ser: 17.5 mg/dL — ABNORMAL HIGH (ref 0.61–1.24)
Glucose, Bld: 307 mg/dL — ABNORMAL HIGH (ref 70–99)
HCT: 30 % — ABNORMAL LOW (ref 39.0–52.0)
Hemoglobin: 10.2 g/dL — ABNORMAL LOW (ref 13.0–17.0)
Potassium: 4.3 mmol/L (ref 3.5–5.1)
Sodium: 140 mmol/L (ref 135–145)
TCO2: 32 mmol/L (ref 22–32)

## 2021-08-11 LAB — GLUCOSE, CAPILLARY: Glucose-Capillary: 308 mg/dL — ABNORMAL HIGH (ref 70–99)

## 2021-08-11 SURGERY — PARS PLANA VITRECTOMY WITH 25 GAUGE
Anesthesia: General | Laterality: Left

## 2021-08-11 MED ORDER — SODIUM CHLORIDE 0.9 % IV SOLN: INTRAVENOUS | Status: DC

## 2021-08-11 MED ORDER — ATROPINE SULFATE 1 % OP SOLN: 1.0000 [drp] | OPHTHALMIC | Status: AC

## 2021-08-11 MED ORDER — ORAL CARE MOUTH RINSE: 15.0000 mL | Freq: Once | OROMUCOSAL | Status: DC

## 2021-08-11 MED ORDER — CHLORHEXIDINE GLUCONATE 0.12 % MT SOLN
15.0000 mL | Freq: Once | OROMUCOSAL | Status: DC
  Filled 2021-08-11: qty 15

## 2021-08-11 MED ORDER — PHENYLEPHRINE HCL 2.5 % OP SOLN: 1.0000 [drp] | OPHTHALMIC | Status: AC

## 2021-08-11 NOTE — Progress Notes (Signed)
Pt's surgery cancelled. Encouraged pt to f/u with PCP regarding BP, but pt states it's only high because he's nervous.  Instructed to treat self with insulin/call PCP regarding elevated CBG. IV removed, escorted out via ambulation with NT.  Sister to take pt home.

## 2021-08-11 NOTE — Progress Notes (Signed)
Dr. Ola Spurr called and made aware of pt's full breakfast this morning at 9:30am including corn beef hash, eggs, waffles, and protein shake. CBG 300's, BP 207/111.  Pt did take all BP meds this morning and had 5 units of insulin at time of breakfast.  Dr. Ola Spurr planning to cancel, but will speak with Dr. Baird Cancer first.

## 2021-08-13 ENCOUNTER — Emergency Department (HOSPITAL_BASED_OUTPATIENT_CLINIC_OR_DEPARTMENT_OTHER)
Admission: EM | Admit: 2021-08-13 | Discharge: 2021-08-13 | Disposition: A | Discharge status: Home / Self Care | Payer: Medicaid Other | Attending: Emergency Medicine | Admitting: Emergency Medicine

## 2021-08-13 ENCOUNTER — Other Ambulatory Visit: Payer: Self-pay

## 2021-08-13 ENCOUNTER — Encounter (HOSPITAL_BASED_OUTPATIENT_CLINIC_OR_DEPARTMENT_OTHER): Payer: Self-pay | Admitting: Emergency Medicine

## 2021-08-13 DIAGNOSIS — H5712 Ocular pain, left eye: Secondary | ICD-10-CM | POA: Diagnosis not present

## 2021-08-13 DIAGNOSIS — Z992 Dependence on renal dialysis: Secondary | ICD-10-CM | POA: Insufficient documentation

## 2021-08-13 DIAGNOSIS — E1022 Type 1 diabetes mellitus with diabetic chronic kidney disease: Secondary | ICD-10-CM | POA: Diagnosis not present

## 2021-08-13 DIAGNOSIS — N186 End stage renal disease: Secondary | ICD-10-CM | POA: Diagnosis not present

## 2021-08-13 DIAGNOSIS — Z794 Long term (current) use of insulin: Secondary | ICD-10-CM | POA: Diagnosis not present

## 2021-08-13 DIAGNOSIS — I120 Hypertensive chronic kidney disease with stage 5 chronic kidney disease or end stage renal disease: Secondary | ICD-10-CM | POA: Insufficient documentation

## 2021-08-13 DIAGNOSIS — Z87891 Personal history of nicotine dependence: Secondary | ICD-10-CM | POA: Diagnosis not present

## 2021-08-13 DIAGNOSIS — Z79899 Other long term (current) drug therapy: Secondary | ICD-10-CM | POA: Insufficient documentation

## 2021-08-13 MED ORDER — HYDROCODONE-ACETAMINOPHEN 5-325 MG PO TABS
1.0000 | ORAL_TABLET | Freq: Once | ORAL | Status: AC
  Administered 2021-08-13: 1 via ORAL
  Filled 2021-08-13: qty 1

## 2021-08-13 MED ORDER — HYDROCODONE-ACETAMINOPHEN 5-325 MG PO TABS: 1.0000 | ORAL_TABLET | ORAL | 0 refills | Status: DC | PRN

## 2021-08-13 MED ORDER — FLUORESCEIN SODIUM 1 MG OP STRP
1.0000 | ORAL_STRIP | Freq: Once | OPHTHALMIC | Status: AC
  Administered 2021-08-13: 1 via OPHTHALMIC
  Filled 2021-08-13: qty 1

## 2021-08-13 MED ORDER — TETRACAINE HCL 0.5 % OP SOLN
2.0000 [drp] | Freq: Once | OPHTHALMIC | Status: AC
  Administered 2021-08-13: 2 [drp] via OPHTHALMIC
  Filled 2021-08-13: qty 4

## 2021-08-13 NOTE — Discharge Instructions (Signed)
Follow-up with your eye surgeon for surgery.. Prescription for Norco was sent to your pharmacy to take for pain.  Do not drive or operate machinery while taking this medication.

## 2021-08-13 NOTE — ED Triage Notes (Addendum)
Pt began to have bleeding from L upper arm fistula site after dialysis today. Bleeding controled in triage.  Pt also complains of L eye pain since Sunday.

## 2021-08-13 NOTE — ED Provider Notes (Signed)
[facility] Provider Note   CSN: [id number] Arrival date & time: 08/13/21  1352     History Chief Complaint  Patient presents with   Bleeding/Bruising    Fistula   Eye Pain    Bobby [name] is a 25 y.o. male.  25 year old male presents emergency room with complaint of pain in his left eye.  Patient states that he was seen in the ER previously, followed up with ophthalmology and was scheduled to have emergency surgery on his eye on December 8.  Patient ate before going in for surgery and his surgery is being rescheduled to an unknown date.  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  Patient states that he lost vision in his eye months ago and it has recently become painful.  Triage complaint of bleeding from his fistula is not why he is here today, he states that he is no longer bleeding from his fistula site after dialysis today and this is not a problem.      Past Medical History:  Diagnosis Date   Diabetes mellitus without complication ([place])    Type I   ESRD (end stage renal disease) ([place])    hemodialysis initiated 02/28/21   Hypertension     Patient Active Problem List   Diagnosis Date Noted   Secondary hyperparathyroidism of renal origin ([place]) 03/29/2021   Muscle weakness (generalized) 03/17/2021   Mild protein-calorie malnutrition ([place]) 03/14/2021   Allergy, unspecified, initial encounter 03/08/2021   Anaphylactic shock, unspecified, initial encounter 03/08/2021   Coagulation defect, unspecified ([place]) 03/08/2021   Dependence on renal dialysis ([place]) 03/08/2021   Encounter for immunization 03/08/2021   Iron deficiency anemia, unspecified 03/08/2021   Rhabdomyolysis 02/28/2021   Hypertensive emergency without congestive heart failure 02/28/2021   Uremia 02/28/2021   Prolonged QT interval 02/28/2021   Anemia due to chronic  kidney disease 02/28/2021   Nausea & vomiting 02/28/2021   Dehydration 02/28/2021   Diabetes 1.5, 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   Hypertensive urgency 02/28/2021   AKI (acute kidney injury) ([place]) 02/28/2021   Acute renal failure superimposed on chronic kidney disease ([place]) 02/27/2021   Leg swelling 12/01/2019   Persistent proteinuria 12/01/2019   Essential hypertension 06/24/2019   Uninsured 06/24/2019   Hx of noncompliance with medical treatment, presenting hazards to health 06/06/2019   Chronic kidney disease, stage 3 ([place]) 06/05/2019    Past Surgical History:  Procedure Laterality Date   AV FISTULA PLACEMENT Left 03/04/2021   Procedure: Basilic Vein Transposition Left upper arm fistula;  Surgeon: Rosetta Posner, MD;  Location: [facility];  Service: Vascular;  Laterality: Left;  PERIPHERAL NERVE BLOCK   IR PERC TUN PERIT CATH WO PORT S&I /IMAG  02/28/2021   IR US GUIDE VASC ACCESS RIGHT  02/28/2021       Family History  Problem Relation Age of Onset   Diabetes Mother    Kidney disease Other    Hypertension Other     Social History   Tobacco Use   Smoking status: Former    Types: Cigarettes   Smokeless tobacco: Never  Vaping Use   Vaping Use: Never used  Substance Use Topics   Alcohol use: Never   Drug use: Yes    Types: Marijuana    Comment: daily    Home Medications Prior to Admission medications   Medication Sig Start  Date End Date Taking? Authorizing Provider  HYDROcodone-acetaminophen (NORCO/VICODIN) 5-325 MG tablet Take 1 tablet by mouth every 4 (four) hours as needed. 08/13/21  Yes Tacy Learn, PA-C  amLODipine (NORVASC) 10 MG tablet Take 1 tablet (10 mg total) by mouth daily. 03/13/21   Ghimire, Henreitta Leber, MD  carvedilol (COREG) 25 MG tablet Take 1 tablet (25 mg total) by mouth 2 (two) times daily with a meal. 03/12/21   Ghimire, Henreitta Leber, MD  cloNIDine (CATAPRES) 0.3 MG tablet Take 1 tablet (0.3 mg total) by mouth 3 (three) times daily. 03/12/21   Ghimire,  Henreitta Leber, MD  COMBIGAN 0.2-0.5 % ophthalmic solution Place 1 drop into the left eye 2 (two) times daily. 08/08/21   [provider]  cyclobenzaprine (FLEXERIL) 10 MG tablet Take 1 tablet (10 mg total) by mouth 2 (two) times daily as needed for muscle spasms. Patient not taking: Reported on 08/09/2021 07/17/21   Sherrill Raring, PA-C  Doxercalciferol (HECTOROL IV) Doxercalciferol (Hectorol) 03/31/21 03/30/22  [provider]  erythromycin ophthalmic ointment Place 1 application into the left eye 3 (three) times daily. 08/08/21   [provider]  heparin 1000 unit/mL SOLN injection Heparin Sodium (Porcine) 1,000 Units/mL Catheter Lock Arterial 03/10/21 03/09/22  [provider]  insulin aspart (NOVOLOG) 100 UNIT/ML injection Inject 5 Units into the skin 3 (three) times daily before meals. 03/12/21   Ghimire, Henreitta Leber, MD  iron sucrose in sodium chloride 0.9 % 100 mL Iron Sucrose (Venofer) 04/12/21 04/11/22  [provider]  isosorbide mononitrate (IMDUR) 60 MG 24 hr tablet Take 1 tablet (60 mg total) by mouth daily. 03/12/21   Ghimire, Henreitta Leber, MD  latanoprost (XALATAN) 0.005 % ophthalmic solution Place 1 drop into the left eye at bedtime. 08/08/21   [provider]  Methoxy PEG-Epoetin Beta (MIRCERA IJ) Mircera 03/17/21 03/16/22  [provider]  ofloxacin (OCUFLOX) 0.3 % ophthalmic solution Place 1 drop into the left eye 3 (three) times daily. 08/08/21   [provider]    Allergies    Patient has no known allergies.  Review of Systems   Review of Systems  Constitutional:  Negative for fever.  Eyes:  Positive for photophobia and pain.  Skin:  Negative for rash and wound.  Allergic/Immunologic: Positive for immunocompromised state.  Neurological:  Negative for weakness and headaches.  Psychiatric/Behavioral:  Negative for confusion.   All other systems reviewed and are negative.  Physical Exam Updated Vital Signs BP (!) 166/89   Pulse  84   Temp 98.3 F (36.8 C) (Oral)   Resp 18   SpO2 90%   Physical Exam Vitals and nursing note reviewed.  Constitutional:      General: He is not in acute distress.    Appearance: He is well-developed. He is not diaphoretic.  HENT:     Head: Normocephalic and atraumatic.  Pulmonary:     Effort: Pulmonary effort is normal.  Neurological:     Mental Status: He is alert and oriented to person, place, and time.  Psychiatric:        Behavior: Behavior normal.    ED Results / Procedures / Treatments   Labs (all labs ordered are listed, but only abnormal results are displayed) Labs Reviewed - No data to display  EKG None  Radiology No results found.  Procedures Procedures   Medications Ordered in ED Medications  fluorescein ophthalmic strip 1 strip (1 strip Left Eye Given 08/13/21 1435)  tetracaine (PONTOCAINE) 0.5 % ophthalmic  solution 2 drop (2 drops Both Eyes Given by Other 08/13/21 1434)  HYDROcodone-acetaminophen (NORCO/VICODIN) 5-325 MG per tablet 1 tablet (1 tablet Oral Given 08/13/21 1445)    ED Course  I have reviewed the triage vital signs and the nursing notes.  Pertinent labs & imaging results that were available during my care of the patient were reviewed by me and considered in my medical decision making (see chart for details).  Clinical Course as of 08/13/21 1512  Sat Aug 13, 7134  [id number] 25 year old male with complaint of left eye pain as per HPI.  Review of chart from patient was scheduled for surgery on December 8 for preoperative diagnosis of proliferative diabetic retinopathy, vitreous hemorrhage, neovascular glaucoma. [LM]  1459 Patient refuses tetracaine drops and Tono-Pen.  Allows for very limited exam of his eye.  Conjunctiva is injected peripherally, cornea is steamy.  Patient does have photophobia. [LM]  1501 Case discussed with Dr. Wyatt Portela with ophthalmology who states this is chronic and can follow-up in the office. [LM]    Clinical Course  User Index [LM] Roque Lias   MDM Rules/Calculators/A&P                           Final Clinical Impression(s) / ED Diagnoses Final diagnoses:  Pain of left eye    Rx / DC Orders ED Discharge Orders          Ordered    HYDROcodone-acetaminophen (NORCO/VICODIN) 5-325 MG tablet  Every 4 hours PRN        08/13/21 1510             Tacy Learn, PA-C 08/13/21 1512    Gareth Morgan, MD 08/15/21 0004

## 2021-08-15 ENCOUNTER — Other Ambulatory Visit: Payer: Self-pay

## 2021-08-15 ENCOUNTER — Emergency Department (HOSPITAL_BASED_OUTPATIENT_CLINIC_OR_DEPARTMENT_OTHER): Admission: EM | Admit: 2021-08-15 | Payer: Medicaid Other | Source: Home / Self Care

## 2022-08-26 IMAGING — CT CT HEAD W/O CM
3 series · 15 of 47 positions shown, 18 images · non-contrast
Comparison: CT head dated June 05, 2019.

CLINICAL DATA: Right leg weakness.

EXAM:
CT HEAD WITHOUT CONTRAST
TECHNIQUE: Contiguous axial images were obtained from the base of the skull
through the vertex without intravenous contrast.

[Series 3: head 5.0 h30s · axial · 0.50mm/px · z∈[-157,-7]mm · 9 of 36 slices shown, 12 images]
[im 3/36  brain]
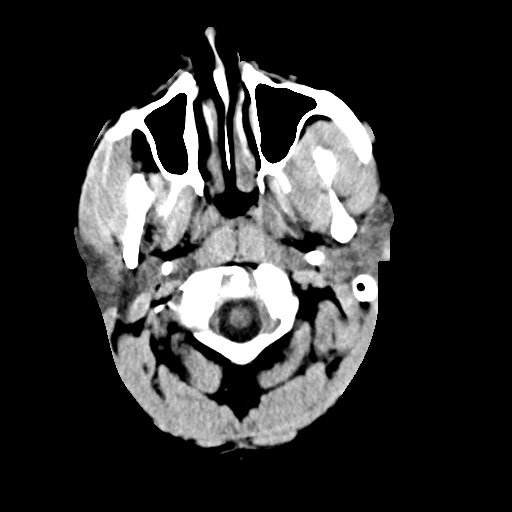
[im 3/36  bone]
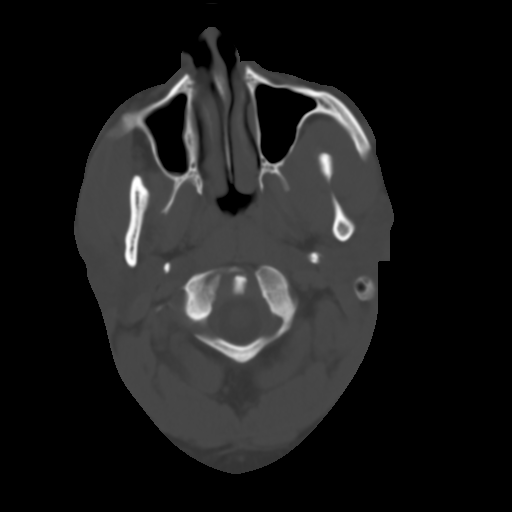
[im 7/36  brain]
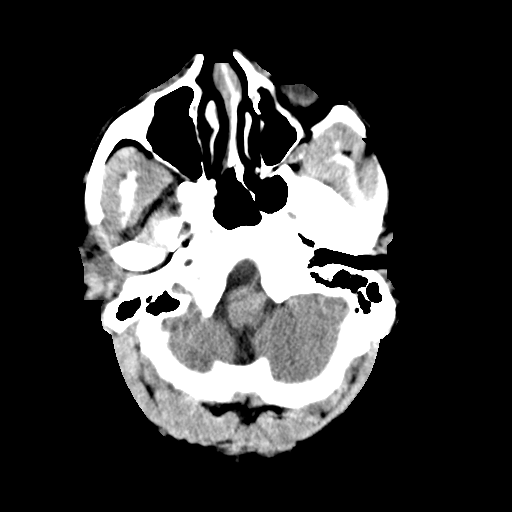
[im 10/36  brain]
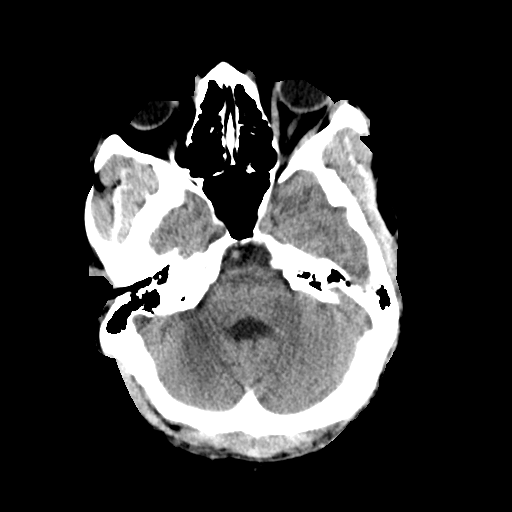
[im 14/36  brain]
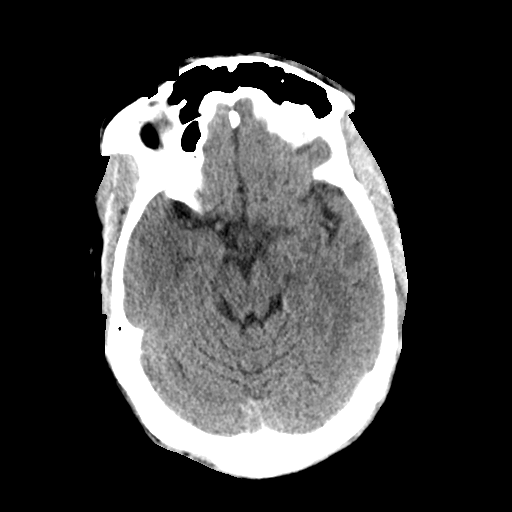
[im 19/36  brain]
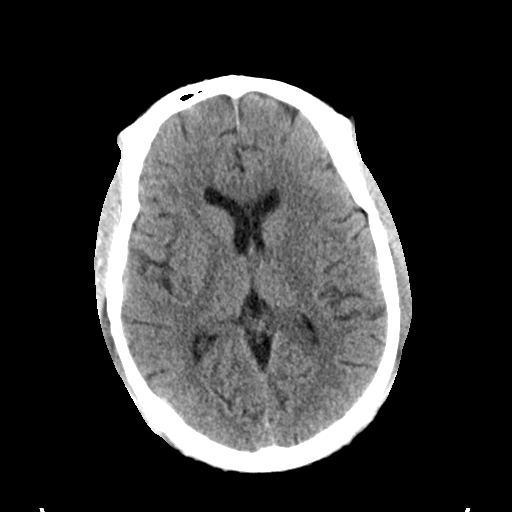
[im 19/36  bone]
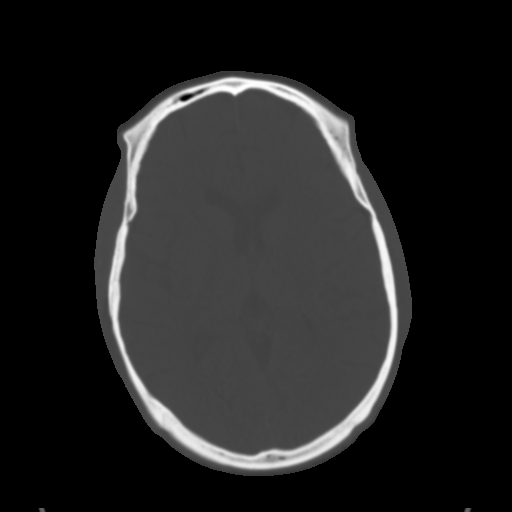
[im 22/36  brain]
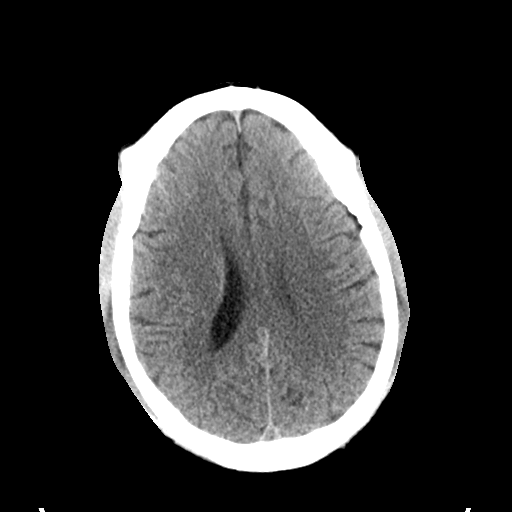
[im 26/36  brain]
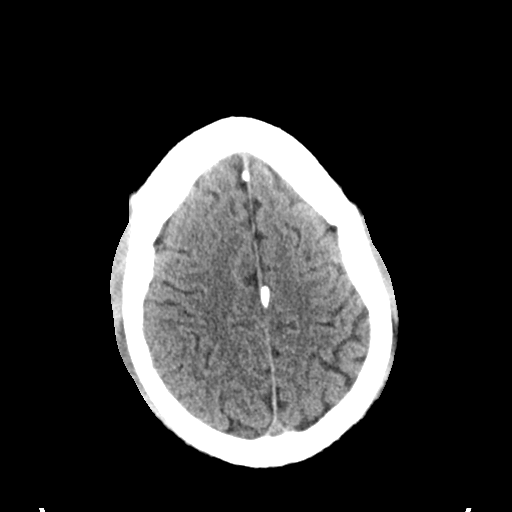
[im 29/36  brain]
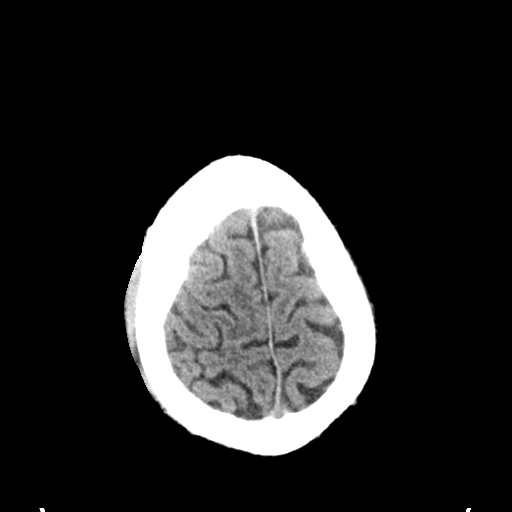
[im 33/36  brain]
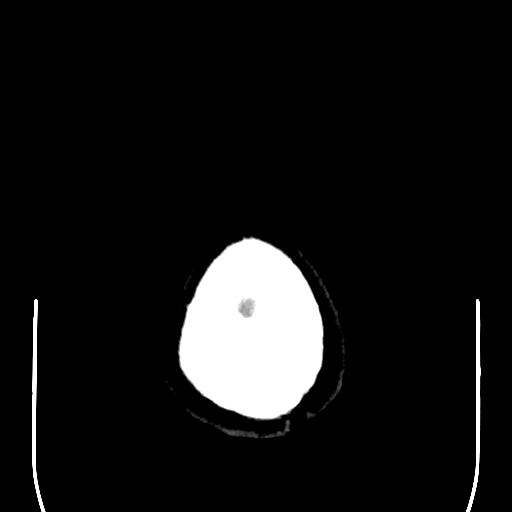
[im 33/36  bone]
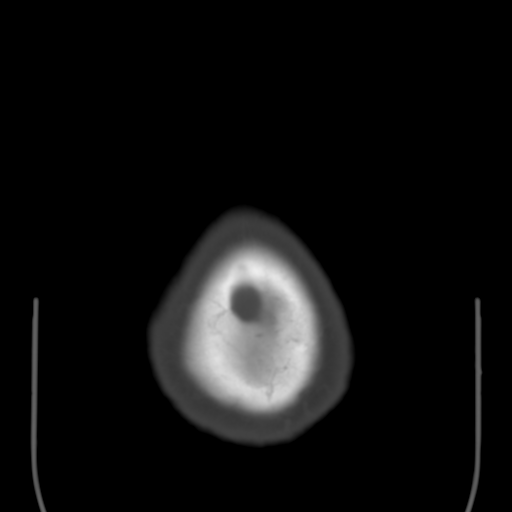

[Series 5: head 3.0 mpr cor · coronal · 0.35mm/px · 3 of 81 slices shown]
[im 27/81  brain]
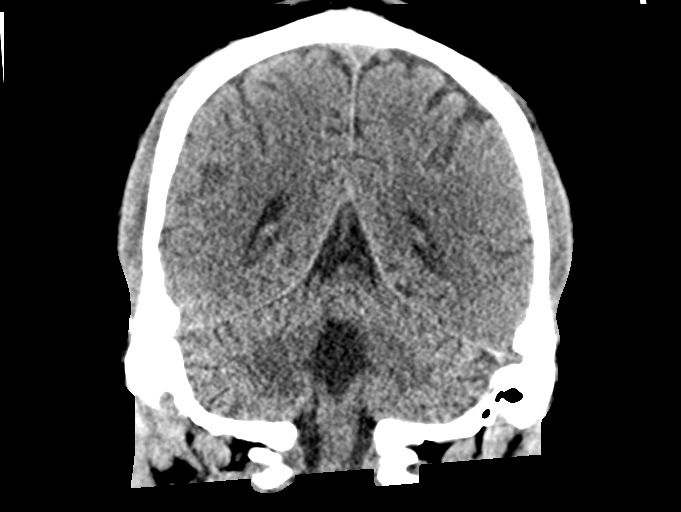
[im 36/81  brain]
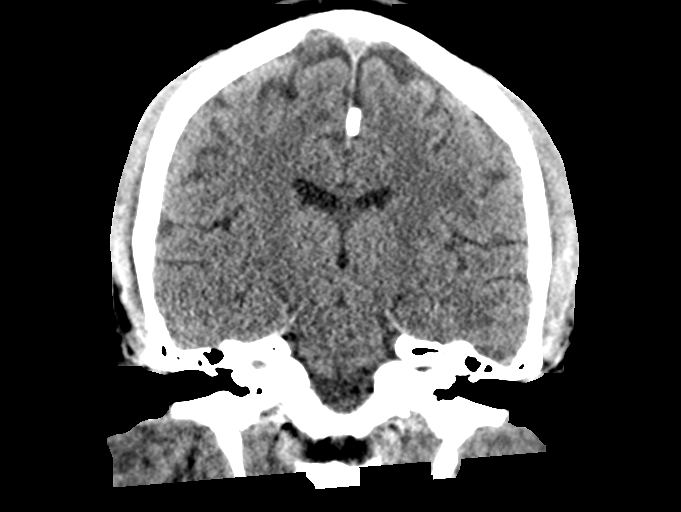
[im 45/81  brain]
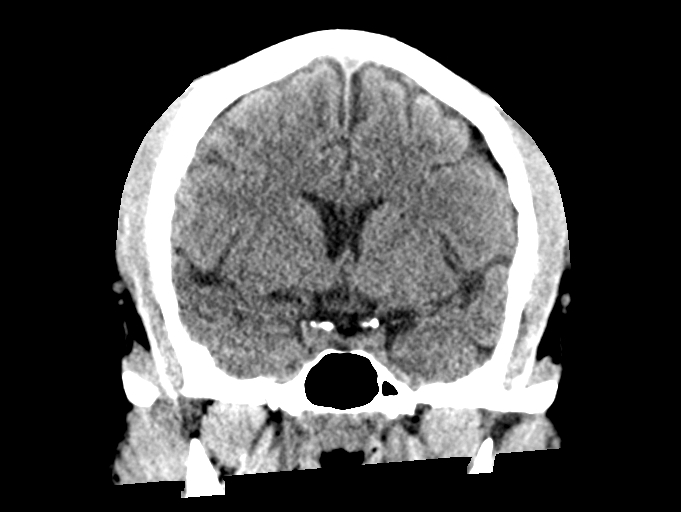

[Series 6: head 3.0 mpr sag · sagittal · 0.35mm/px · 3 of 67 slices shown]
[im 23/67  brain]
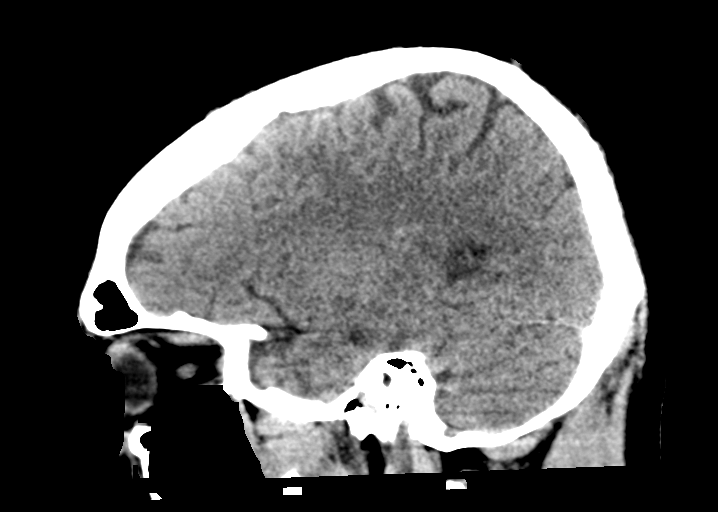
[im 34/67  brain]
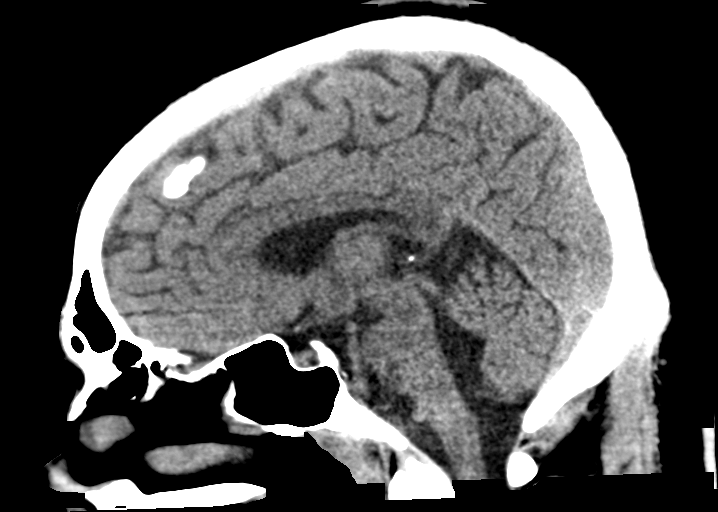
[im 45/67  brain]
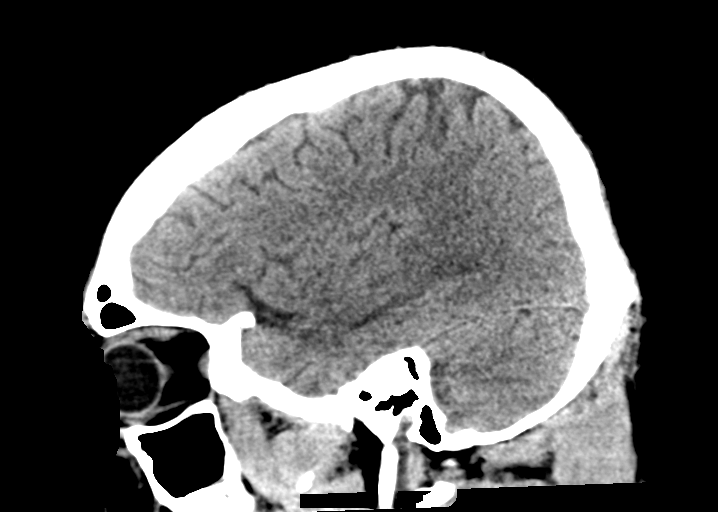

[15 of 47 positions shown; findings below may reference images not displayed]

FINDINGS: Brain: No evidence of acute infarction, hemorrhage, hydrocephalus,
extra-axial collection or mass lesion/mass effect.

Vascular: No hyperdense vessel or unexpected calcification.

Skull: Normal. Negative for fracture or focal lesion.

Sinuses/Orbits: No acute finding.

Other: None.
IMPRESSION: 1. Normal noncontrast head CT.

## 2022-09-18 ENCOUNTER — Other Ambulatory Visit (HOSPITAL_COMMUNITY): Payer: Self-pay | Admitting: Nephrology

## 2022-09-18 ENCOUNTER — Encounter (HOSPITAL_COMMUNITY): Payer: Self-pay | Admitting: Nephrology

## 2022-09-18 DIAGNOSIS — Z992 Dependence on renal dialysis: Secondary | ICD-10-CM

## 2022-09-18 DIAGNOSIS — E8770 Fluid overload, unspecified: Secondary | ICD-10-CM

## 2023-04-01 ENCOUNTER — Emergency Department (HOSPITAL_COMMUNITY): Payer: Medicaid Other

## 2023-04-01 ENCOUNTER — Encounter (HOSPITAL_COMMUNITY): Payer: Self-pay | Admitting: Internal Medicine

## 2023-04-01 ENCOUNTER — Inpatient Hospital Stay (HOSPITAL_COMMUNITY)
Admission: EM | Admit: 2023-04-01 | Discharge: 2023-04-03 | DRG: 291 | Disposition: A | Discharge status: Home / Self Care | Payer: Medicaid Other | Attending: Internal Medicine | Admitting: Internal Medicine

## 2023-04-01 DIAGNOSIS — E669 Obesity, unspecified: Secondary | ICD-10-CM | POA: Diagnosis present

## 2023-04-01 DIAGNOSIS — D631 Anemia in chronic kidney disease: Secondary | ICD-10-CM | POA: Diagnosis present

## 2023-04-01 DIAGNOSIS — E875 Hyperkalemia: Secondary | ICD-10-CM

## 2023-04-01 DIAGNOSIS — I3139 Other pericardial effusion (noninflammatory): Secondary | ICD-10-CM

## 2023-04-01 DIAGNOSIS — I1311 Hypertensive heart and chronic kidney disease without heart failure, with stage 5 chronic kidney disease, or end stage renal disease: Secondary | ICD-10-CM

## 2023-04-01 DIAGNOSIS — F1290 Cannabis use, unspecified, uncomplicated: Secondary | ICD-10-CM | POA: Diagnosis present

## 2023-04-01 DIAGNOSIS — M549 Dorsalgia, unspecified: Secondary | ICD-10-CM | POA: Diagnosis present

## 2023-04-01 DIAGNOSIS — F32A Depression, unspecified: Secondary | ICD-10-CM | POA: Diagnosis present

## 2023-04-01 DIAGNOSIS — Z87891 Personal history of nicotine dependence: Secondary | ICD-10-CM

## 2023-04-01 DIAGNOSIS — Z79899 Other long term (current) drug therapy: Secondary | ICD-10-CM

## 2023-04-01 DIAGNOSIS — I959 Hypotension, unspecified: Secondary | ICD-10-CM | POA: Diagnosis present

## 2023-04-01 DIAGNOSIS — Z833 Family history of diabetes mellitus: Secondary | ICD-10-CM

## 2023-04-01 DIAGNOSIS — F419 Anxiety disorder, unspecified: Secondary | ICD-10-CM | POA: Diagnosis present

## 2023-04-01 DIAGNOSIS — J9601 Acute respiratory failure with hypoxia: Secondary | ICD-10-CM

## 2023-04-01 DIAGNOSIS — M25552 Pain in left hip: Secondary | ICD-10-CM | POA: Diagnosis present

## 2023-04-01 DIAGNOSIS — I132 Hypertensive heart and chronic kidney disease with heart failure and with stage 5 chronic kidney disease, or end stage renal disease: Principal | ICD-10-CM

## 2023-04-01 DIAGNOSIS — Z6835 Body mass index (BMI) 35.0-35.9, adult: Secondary | ICD-10-CM

## 2023-04-01 DIAGNOSIS — I5023 Acute on chronic systolic (congestive) heart failure: Secondary | ICD-10-CM | POA: Diagnosis present

## 2023-04-01 DIAGNOSIS — N186 End stage renal disease: Secondary | ICD-10-CM | POA: Diagnosis not present

## 2023-04-01 DIAGNOSIS — Z992 Dependence on renal dialysis: Secondary | ICD-10-CM | POA: Diagnosis not present

## 2023-04-01 DIAGNOSIS — E1022 Type 1 diabetes mellitus with diabetic chronic kidney disease: Secondary | ICD-10-CM

## 2023-04-01 DIAGNOSIS — I509 Heart failure, unspecified: Principal | ICD-10-CM

## 2023-04-01 DIAGNOSIS — N250 Renal osteodystrophy: Secondary | ICD-10-CM | POA: Diagnosis present

## 2023-04-01 DIAGNOSIS — W19XXXA Unspecified fall, initial encounter: Secondary | ICD-10-CM | POA: Diagnosis present

## 2023-04-01 DIAGNOSIS — Z8249 Family history of ischemic heart disease and other diseases of the circulatory system: Secondary | ICD-10-CM

## 2023-04-01 DIAGNOSIS — Z794 Long term (current) use of insulin: Secondary | ICD-10-CM

## 2023-04-01 DIAGNOSIS — I10 Essential (primary) hypertension: Secondary | ICD-10-CM | POA: Diagnosis present

## 2023-04-01 LAB — COMPREHENSIVE METABOLIC PANEL
ALT: 36 U/L (ref 0–44)
AST: 33 U/L (ref 15–41)
Albumin: <1.5 g/dL — ABNORMAL LOW (ref 3.5–5.0)
Alkaline Phosphatase: 228 U/L — ABNORMAL HIGH (ref 38–126)
Anion gap: 16 — ABNORMAL HIGH (ref 5–15)
BUN: 87 mg/dL — ABNORMAL HIGH (ref 6–20)
CO2: 28 mmol/L (ref 22–32)
Calcium: 7.3 mg/dL — ABNORMAL LOW (ref 8.9–10.3)
Chloride: 93 mmol/L — ABNORMAL LOW (ref 98–111)
Creatinine, Ser: 11.82 mg/dL — ABNORMAL HIGH (ref 0.61–1.24)
GFR, Estimated: 5 mL/min — ABNORMAL LOW (ref >60)
Glucose, Bld: 119 mg/dL — ABNORMAL HIGH (ref 70–99)
Potassium: 5.7 mmol/L — ABNORMAL HIGH (ref 3.5–5.1)
Sodium: 137 mmol/L (ref 135–145)
Total Bilirubin: 1 mg/dL (ref 0.3–1.2)
Total Protein: 5.7 g/dL — ABNORMAL LOW (ref 6.5–8.1)

## 2023-04-01 LAB — APTT: aPTT: 48 seconds — ABNORMAL HIGH (ref 24–36)

## 2023-04-01 LAB — CBC WITH DIFFERENTIAL/PLATELET
Abs Immature Granulocytes: 0.05 10*3/uL (ref 0.00–0.07)
Basophils Absolute: 0 10*3/uL (ref 0.0–0.1)
Basophils Relative: 0 %
Eosinophils Absolute: 0.2 10*3/uL (ref 0.0–0.5)
Eosinophils Relative: 2 %
HCT: 25.8 % — ABNORMAL LOW (ref 39.0–52.0)
Hemoglobin: 7.9 g/dL — ABNORMAL LOW (ref 13.0–17.0)
Immature Granulocytes: 1 %
Lymphocytes Relative: 8 %
Lymphs Abs: 0.7 10*3/uL (ref 0.7–4.0)
MCH: 27.1 pg (ref 26.0–34.0)
MCHC: 30.6 g/dL (ref 30.0–36.0)
MCV: 88.4 fL (ref 80.0–100.0)
Monocytes Absolute: 0.8 10*3/uL (ref 0.1–1.0)
Monocytes Relative: 10 %
Neutro Abs: 6.7 10*3/uL (ref 1.7–7.7)
Neutrophils Relative %: 79 %
Platelets: 371 10*3/uL (ref 150–400)
RBC: 2.92 MIL/uL — ABNORMAL LOW (ref 4.22–5.81)
RDW: 15.9 % — ABNORMAL HIGH (ref 11.5–15.5)
WBC: 8.4 10*3/uL (ref 4.0–10.5)
nRBC: 0 % (ref 0.0–0.2)

## 2023-04-01 LAB — I-STAT CHEM 8, ED
BUN: 86 mg/dL — ABNORMAL HIGH (ref 6–20)
Calcium, Ion: 0.92 mmol/L — ABNORMAL LOW (ref 1.15–1.40)
Chloride: 96 mmol/L — ABNORMAL LOW (ref 98–111)
Creatinine, Ser: 12.9 mg/dL — ABNORMAL HIGH (ref 0.61–1.24)
Glucose, Bld: 119 mg/dL — ABNORMAL HIGH (ref 70–99)
HCT: 23 % — ABNORMAL LOW (ref 39.0–52.0)
Hemoglobin: 7.8 g/dL — ABNORMAL LOW (ref 13.0–17.0)
Potassium: 5.8 mmol/L — ABNORMAL HIGH (ref 3.5–5.1)
Sodium: 136 mmol/L (ref 135–145)
TCO2: 31 mmol/L (ref 22–32)

## 2023-04-01 LAB — PROTIME-INR
INR: 1.7 — ABNORMAL HIGH (ref 0.8–1.2)
Prothrombin Time: 20.2 seconds — ABNORMAL HIGH (ref 11.4–15.2)

## 2023-04-01 LAB — ECHOCARDIOGRAM COMPLETE
Area-P 1/2: 2.84 cm2
Calc EF: 30.8 %
S' Lateral: 4.9 cm
Single Plane A2C EF: 25.6 %
Single Plane A4C EF: 34.8 %
Weight: 4303.38 oz

## 2023-04-01 LAB — TROPONIN I (HIGH SENSITIVITY)
Troponin I (High Sensitivity): 88 ng/L — ABNORMAL HIGH (ref <18)
Troponin I (High Sensitivity): 79 ng/L — ABNORMAL HIGH (ref <18)

## 2023-04-01 LAB — BRAIN NATRIURETIC PEPTIDE: B Natriuretic Peptide: 4219.5 pg/mL — ABNORMAL HIGH (ref 0.0–100.0)

## 2023-04-01 LAB — CBG MONITORING, ED: Glucose-Capillary: 116 mg/dL — ABNORMAL HIGH (ref 70–99)

## 2023-04-01 LAB — I-STAT CG4 LACTIC ACID, ED: Lactic Acid, Venous: 0.7 mmol/L (ref 0.5–1.9)

## 2023-04-01 MED ORDER — CYCLOBENZAPRINE HCL 10 MG PO TABS
10.0000 mg | ORAL_TABLET | Freq: Two times a day (BID) | ORAL | Status: DC | PRN
  Administered 2023-04-02: 10 mg via ORAL
  Filled 2023-04-01: qty 1

## 2023-04-01 MED ORDER — CHLORHEXIDINE GLUCONATE CLOTH 2 % EX PADS: 6.0000 | MEDICATED_PAD | Freq: Every day | CUTANEOUS | Status: DC

## 2023-04-01 MED ORDER — ACETAMINOPHEN 650 MG RE SUPP: 650.0000 mg | Freq: Four times a day (QID) | RECTAL | Status: DC | PRN

## 2023-04-01 MED ORDER — SERTRALINE HCL 50 MG PO TABS
50.0000 mg | ORAL_TABLET | Freq: Every day | ORAL | Status: DC
  Administered 2023-04-02: 50 mg via ORAL
  Filled 2023-04-01 (×2): qty 1

## 2023-04-01 MED ORDER — HEPARIN SODIUM (PORCINE) 1000 UNIT/ML IJ SOLN
INTRAMUSCULAR | Status: AC
  Filled 2023-04-01: qty 12

## 2023-04-01 MED ORDER — SODIUM CHLORIDE 0.9 % IV BOLUS: 250.0000 mL | Freq: Once | INTRAVENOUS | Status: DC

## 2023-04-01 MED ORDER — HEPARIN SODIUM (PORCINE) 5000 UNIT/ML IJ SOLN
5000.0000 [IU] | Freq: Three times a day (TID) | INTRAMUSCULAR | Status: DC
  Filled 2023-04-01 (×4): qty 1

## 2023-04-01 MED ORDER — ACETAMINOPHEN 325 MG PO TABS: 650.0000 mg | ORAL_TABLET | Freq: Four times a day (QID) | ORAL | Status: DC | PRN

## 2023-04-01 MED ORDER — INSULIN ASPART 100 UNIT/ML IJ SOLN: 0.0000 [IU] | Freq: Three times a day (TID) | INTRAMUSCULAR | Status: DC

## 2023-04-01 MED ORDER — POLYETHYLENE GLYCOL 3350 17 G PO PACK: 17.0000 g | PACK | Freq: Every day | ORAL | Status: DC | PRN

## 2023-04-01 MED ORDER — ACETAMINOPHEN 500 MG PO TABS
1000.0000 mg | ORAL_TABLET | Freq: Once | ORAL | Status: AC
  Administered 2023-04-01: 1000 mg via ORAL
  Filled 2023-04-01: qty 2

## 2023-04-01 MED ORDER — INSULIN ASPART 100 UNIT/ML IJ SOLN: 0.0000 [IU] | Freq: Every day | INTRAMUSCULAR | Status: DC

## 2023-04-01 NOTE — Progress Notes (Signed)
MD and Xray at bedside.

## 2023-04-01 NOTE — ED Provider Notes (Signed)
[name] [facility]GENCY DE[facility]MENT AT [facility] Provider Note   CSN: [id number] Arrival date & time: 04/01/23  1528     History  Chief Complaint  Patient presents with   Chest Pain    Recently discharged 2 days ago. On dialysis. Pt reports missed a reecent dialysis session due to feeling ill. Reports lethergy, cough and increased SOB. Pt sating 89 on RA. Hx of CHF. Pt comng in on a nonrebreather    Bobby [name] is a 27 y.o. male.  HPI 27 year old male with a history of diabetes, ESRD on dialysis, CHF, ascites, noncompliance, presents with shortness of breath.  History is from EMS and patient.  Patient was discharged from [facility] a few days ago.  He states that he injured his back and was put on Percocet which is no longer on but is on a muscle relaxer which he thinks is making him drowsy.  He was coughing all night last night.  He is more short of breath today and not normally on oxygen but was hypoxic for EMS and ultimately put on a facemask.  He has been having chest tightness since he was in the hospital last time.  His legs are more swollen today and he missed his Saturday dialysis yesterday.  He is felt hot but has not checked his temperature.  He has been vomiting.  EMS reports the sister reported his abdominal distention is baseline and not new.  He denies any abdominal pain.  Home Medications Prior to Admission medications   Medication Sig Start Date End Date Taking? Authorizing Provider  acetaminophen (TYLENOL) 325 MG tablet Take 650 mg by mouth as needed for mild pain or moderate pain. 09/30/22  Yes [provider]  albuterol (VENTOLIN HFA) 108 (90 Base) MCG/ACT inhaler Inhale 2 puffs into the lungs every 6 (six) hours as needed for wheezing. 03/30/23 06/28/23 Yes [provider]  carvedilol (COREG) 25 MG tablet Take 1 tablet (25 mg total) by mouth 2 (two) times daily with a meal. 03/12/21  Yes Ghimire, Werner Lean, MD  cinacalcet (SENSIPAR)  30 MG tablet Take 30 mg by mouth daily.   Yes [provider]  gabapentin (NEURONTIN) 100 MG capsule Take 100 mg by mouth 3 (three) times daily. 03/24/23  Yes [provider]  insulin aspart (NOVOLOG) 100 UNIT/ML injection Inject 5 Units into the skin 3 (three) times daily before meals. 03/12/21  Yes Ghimire, Werner Lean, MD  isosorbide mononitrate (IMDUR) 60 MG 24 hr tablet Take 1 tablet (60 mg total) by mouth daily. Patient taking differently: Take 90 mg by mouth at bedtime. Take one and one-half tablets nightly 03/12/21  Yes Ghimire, Werner Lean, MD  lanthanum (FOSRENOL) 1000 MG chewable tablet Chew 1,000 mg by mouth 5 (five) times daily.   Yes [provider]  LOKELMA 10 g PACK packet Take 10 g by mouth daily. 03/24/23  Yes [provider]  sertraline (ZOLOFT) 50 MG tablet Take 50 mg by mouth daily. 03/26/23  Yes [provider]      Allergies    Patient has no known allergies.    Review of Systems   Review of Systems  Respiratory:  Positive for cough, chest tightness and shortness of breath.   Cardiovascular:  Positive for leg swelling.  Gastrointestinal:  Positive for vomiting. Negative for abdominal pain.  Musculoskeletal:  Positive for back pain.    Physical Exam Updated Vital Signs BP (!) 100/50 (BP Location: Right Arm)   Pulse  76   Temp 97.8 F (36.6 C) (Oral)   Resp 16   Wt 122 kg   SpO2 95%   BMI 31.89 kg/m  Physical Exam Vitals and nursing note reviewed.  Constitutional:      Appearance: He is well-developed. He is obese. He is not diaphoretic.  HENT:     Head: Normocephalic and atraumatic.  Cardiovascular:     Rate and Rhythm: Normal rate and regular rhythm.     Heart sounds: Normal heart sounds.  Pulmonary:     Effort: Pulmonary effort is normal.     Comments: Decreased breath sounds on left vs right. No obvious rales/wheezing Abdominal:     Palpations: Abdomen is soft.     Tenderness: There is no abdominal tenderness.   Musculoskeletal:     Right lower leg: Edema present.     Left lower leg: Edema present.     Comments: Mild right lumbar tenderness. No midline/bony tenderness  Skin:    General: Skin is warm and dry.  Neurological:     Mental Status: He is alert.     ED Results / Procedures / Treatments   Labs (all labs ordered are listed, but only abnormal results are displayed) Labs Reviewed  COMPREHENSIVE METABOLIC PANEL - Abnormal; Notable for the following components:      Result Value   Potassium 5.7 (*)    Chloride 93 (*)    Glucose, Bld 119 (*)    BUN 87 (*)    Creatinine, Ser 11.82 (*)    Calcium 7.3 (*)    Total Protein 5.7 (*)    Albumin <1.5 (*)    Alkaline Phosphatase 228 (*)    GFR, Estimated 5 (*)    Anion gap 16 (*)    All other components within normal limits  CBC WITH DIFFERENTIAL/PLATELET - Abnormal; Notable for the following components:   RBC 2.92 (*)    Hemoglobin 7.9 (*)    HCT 25.8 (*)    RDW 15.9 (*)    All other components within normal limits  PROTIME-INR - Abnormal; Notable for the following components:   Prothrombin Time 20.2 (*)    INR 1.7 (*)    All other components within normal limits  APTT - Abnormal; Notable for the following components:   aPTT 48 (*)    All other components within normal limits  BRAIN NATRIURETIC PEPTIDE - Abnormal; Notable for the following components:   B Natriuretic Peptide 4,219.5 (*)    All other components within normal limits  I-STAT CHEM 8, ED - Abnormal; Notable for the following components:   Potassium 5.8 (*)    Chloride 96 (*)    BUN 86 (*)    Creatinine, Ser 12.90 (*)    Glucose, Bld 119 (*)    Calcium, Ion 0.92 (*)    Hemoglobin 7.8 (*)    HCT 23.0 (*)    All other components within normal limits  CBG MONITORING, ED - Abnormal; Notable for the following components:   Glucose-Capillary 116 (*)    All other components within normal limits  TROPONIN I (HIGH SENSITIVITY) - Abnormal; Notable for the following  components:   Troponin I (High Sensitivity) 88 (*)    All other components within normal limits  TROPONIN I (HIGH SENSITIVITY) - Abnormal; Notable for the following components:   Troponin I (High Sensitivity) 79 (*)    All other components within normal limits  CULTURE, BLOOD (ROUTINE X 2)  CULTURE, BLOOD (ROUTINE X 2)  CBC  MAGNESIUM  HIV ANTIBODY (ROUTINE TESTING W REFLEX)  RENAL FUNCTION PANEL  HEPATITIS B SURFACE ANTIGEN  HEPATITIS B SURFACE ANTIBODY, QUANTITATIVE  I-STAT CG4 LACTIC ACID, ED    EKG EKG Interpretation Date/Time:  Sunday April 01 2023 15:32:10 EDT Ventricular Rate:  83 PR Interval:  177 QRS Duration:  105 QT Interval:  403 QTC Calculation: 474 R Axis:   10  Text Interpretation: Sinus rhythm Low voltage, extremity and precordial leads Borderline prolonged QT interval low voltage new since July 2022 Confirmed by Lemon Whitacre ([id number]) on 04/01/2023 3:50:16 PM  Radiology ECHOCARDIOGRAM COMPLETE  Result Date: 04/01/2023    ECHOCARDIOGRAM REPORT   Patient Name:   Bobby [name] Date of Exam: 04/01/2023 Medical Rec #:  [id number]          Height:       77.0 in Accession #:    [id number]         Weight:       269.0 lb Date of Birth:  10/02/1995           BSA:          2.537 m Patient Age:    27 years           BP:           120/53 mmHg Patient Gender: M                  HR:           81  bpm. Exam Location:  Inpatient Procedure: 2D Echo, Cardiac Doppler, Color Doppler and 3D Echo STAT ECHO Indications:    pericardial effusion  History:        Patient has no prior history of Echocardiogram examinations.                 Chronic kidney disease, Signs/Symptoms:Edema; Risk                 Factors:Diabetes and Hypertension.  Sonographer:    Aron Baba Referring Phys: [phone] Hannan Tetzlaff IMPRESSIONS  1. Left ventricular ejection fraction, by estimation, is 30 to 35%. The left ventricle has moderately decreased function. The left ventricle has no regional wall motion  abnormalities. There is moderate left ventricular hypertrophy. Left ventricular diastolic parameters are consistent with Grade I diastolic dysfunction (impaired relaxation).  2. Right ventricular systolic function is normal. The right ventricular size is moderately enlarged. There is normal pulmonary artery systolic pressure.  3. Left atrial size was severely dilated.  4. Right atrial size was severely dilated.  5. Small to moderate pericardial effusion (posterior predominant) with no tamponade physiology. Moderate pericardial effusion. The pericardial effusion is circumferential. There is no evidence of cardiac tamponade.  6. The mitral valve is normal in structure. No evidence of mitral valve regurgitation. No evidence of mitral stenosis.  7. Tricuspid valve regurgitation is moderate.  8. The aortic valve is tricuspid. Aortic valve regurgitation is not visualized. No aortic stenosis is present.  9. The inferior vena cava is dilated in size with >50% respiratory variability, suggesting right atrial pressure of 8 mmHg. Conclusion(s)/Recommendation(s): Continue with dialysis. No indication for pericardiocentesis. Recurrent ascities per chart as well. FINDINGS  Left Ventricle: Left ventricular ejection fraction, by estimation, is 30 to 35%. The left ventricle has moderately decreased function. The left ventricle has no regional wall motion abnormalities. 3D ejection fraction reviewed and evaluated as part of the interpretation. Alternate measurement of EF is felt to be most reflective of LV function.  The left ventricular internal cavity size was normal in size. There is moderate left ventricular hypertrophy. Left ventricular diastolic parameters are consistent with Grade I diastolic dysfunction (impaired relaxation). Right Ventricle: The right ventricular size is moderately enlarged. No increase in right ventricular wall thickness. Right ventricular systolic function is normal. There is normal pulmonary artery  systolic pressure. The tricuspid regurgitant velocity is 2.10 m/s, and with an assumed right atrial pressure of 15 mmHg, the estimated right ventricular systolic pressure is 32.6 mmHg. Left Atrium: Left atrial size was severely dilated. Right Atrium: Right atrial size was severely dilated. Pericardium: Small to moderate pericardial effusion (posterior predominant) with no tamponade physiology. A moderately sized pericardial effusion is present. The pericardial effusion is circumferential. There is no evidence of cardiac tamponade. Mitral Valve: The mitral valve is normal in structure. There is moderate thickening of the mitral valve leaflet(s). No evidence of mitral valve regurgitation. No evidence of mitral valve stenosis. Tricuspid Valve: The tricuspid valve is normal in structure. Tricuspid valve regurgitation is moderate . No evidence of tricuspid stenosis. Aortic Valve: The aortic valve is tricuspid. Aortic valve regurgitation is not visualized. No aortic stenosis is present. Pulmonic Valve: The pulmonic valve was normal in structure. Pulmonic valve regurgitation is not visualized. No evidence of pulmonic stenosis. Aorta: The aortic root is normal in size and structure. Venous: The inferior vena cava is dilated in size with greater than 50% respiratory variability, suggesting right atrial pressure of 8 mmHg. IAS/Shunts: No atrial level shunt detected by color flow Doppler.  LEFT VENTRICLE PLAX 2D LVIDd:         5.60 cm      Diastology LVIDs:         4.90 cm      LV e' medial:    7.18 cm/s LV PW:         1.60 cm      LV E/e' medial:  19.9 LV IVS:        1.40 cm      LV e' lateral:   9.68 cm/s LVOT diam:     2.40 cm      LV E/e' lateral: 14.8 LV SV:         100 LV SV Index:   40 LVOT Area:     4.52 cm                              3D Volume EF: LV Volumes (MOD)            3D EF:        19 % LV vol d, MOD A2C: 234.0 ml LV EDV:       384 ml LV vol d, MOD A4C: 204.0 ml LV ESV:       312 ml LV vol s, MOD A2C: 174.0  ml LV SV:        73 ml LV vol s, MOD A4C: 133.0 ml LV SV MOD A2C:     60.0 ml LV SV MOD A4C:     204.0 ml LV SV MOD BP:      68.5 ml RIGHT VENTRICLE RV S prime:     11.40 cm/s TAPSE (M-mode): 2.3 cm LEFT ATRIUM              Index        RIGHT ATRIUM           Index Bobby diam:  4.60 cm  1.81 cm/m   RA Area:     55.60 cm Bobby Vol (A2C):   116.0 ml 45.72 ml/m  RA Volume:   331.00 ml 130.47 ml/m Bobby Vol (A4C):   134.0 ml 52.82 ml/m Bobby Biplane Vol: 137.0 ml 54.00 ml/m  AORTIC VALVE             PULMONIC VALVE LVOT Vmax:   106.00 cm/s PR End Diast Vel: 5.34 msec LVOT Vmean:  72.200 cm/s LVOT VTI:    0.222 m  AORTA Ao Root diam: 3.60 cm Ao Asc diam:  2.70 cm MITRAL VALVE                TRICUSPID VALVE MV Area (PHT): 2.84 cm     TR Peak grad:   17.6 mmHg MV Decel Time: 267 msec     TR Vmax:        210.00 cm/s MV E velocity: 143.00 cm/s MV A velocity: 36.00 cm/s   SHUNTS MV E/A ratio:  3.97         Systemic VTI:  0.22 m                             Systemic Diam: 2.40 cm Donato Schultz MD Electronically signed by Donato Schultz MD Signature Date/Time: 04/01/2023/5:06:08 PM    Final    DG Chest Port 1 View  Result Date: 04/01/2023 CLINICAL DATA:  Hypoxia EXAM: PORTABLE CHEST 1 VIEW COMPARISON:  One-view chest x-ray 03/30/2023 at [facility] FINDINGS: The heart is enlarged. Diffuse interstitial edema is increased. Retrocardiac opacification noted. No other focal airspace disease present. IMPRESSION: Cardiomegaly and increasing interstitial edema compatible with congestive heart failure. Electronically Signed   By: Marin Roberts M.D.   On: 04/01/2023 16:29    Procedures Ultrasound ED Echo  Date/Time: 04/01/2023 3:51 PM  Performed by: Pricilla Loveless, MD Authorized by: Pricilla Loveless, MD   Procedure details:    Indications: hypotension and dyspnea     Views: subxiphoid     Images: archived     Limitations:  Body habitus Findings:    Pericardium: moderate pericardial effusion     LV  Function: depressed (30 - 50%)     IVC: normal   Impression:    Impression: pericardial effusion present       Medications Ordered in ED Medications  heparin injection 5,000 Units (has no administration in time range)  acetaminophen (TYLENOL) tablet 650 mg (has no administration in time range)    Or  acetaminophen (TYLENOL) suppository 650 mg (has no administration in time range)  polyethylene glycol (MIRALAX / GLYCOLAX) packet 17 g (has no administration in time range)  cyclobenzaprine (FLEXERIL) tablet 10 mg (has no administration in time range)  insulin aspart (novoLOG) injection 0-9 Units (has no administration in time range)  insulin aspart (novoLOG) injection 0-5 Units (has no administration in time range)  Chlorhexidine Gluconate Cloth 2 % PADS 6 each (has no administration in time range)  sertraline (ZOLOFT) tablet 50 mg (has no administration in time range)  acetaminophen (TYLENOL) tablet 1,000 mg (1,000 mg Oral Given 04/01/23 1822)    ED Course/ Medical Decision Making/ A&P Clinical Course as of 04/01/23 2315  Sun Apr 01, 2023  1601 Discussed case with Dr Anne Fu.  He did have a trace pericardial effusion on echo from [facility] on 7/15.  He recommends a stat echo now and he can review those  images but thinks the effusion is unlikely to be contributing today. [SG]    Clinical Course User Index [SG] Pricilla Loveless, MD                             Medical Decision Making Amount and/or Complexity of Data Reviewed External Data Reviewed: notes.    Details: [facility] d/c notes, results Labs: ordered.    Details: Mild hyperkalemia at 5.7.  Chronic anemia compared to [facility].  BNP significantly elevated at 4000.  Troponins elevated but flat and likely from renal failure. Radiology: ordered and independent interpretation performed.    Details: CHF ECG/medicine tests: ordered and independent interpretation performed.    Details: Low voltage but no  ischemia  Risk OTC drugs. Decision regarding hospitalization.   Patient presents with hypoxia.  He has some soft blood pressures and I was going to give him some fluids but his blood pressure came up on its own.  No signs of an infection at this time.  Seems like he is having CHF which would make sense given his peripheral edema, ascites, and CHF pattern on chest x-ray.  As above, Dr. Anne Fu evaluated and there is no evidence of tamponade.  He will need dialysis and I have discussed with Dr. Arlean Hopping. Also discussed with internal medicine teaching service for admission.        Final Clinical Impression(s) / ED Diagnoses Final diagnoses:  Acute congestive heart failure, unspecified heart failure type (HCC)  Acute respiratory failure with hypoxia (HCC)  Pericardial effusion    Rx / DC Orders ED Discharge Orders     None         Pricilla Loveless, MD 04/01/23 2316

## 2023-04-01 NOTE — Consult Note (Signed)
Renal Service Consult Note [facility]  [place] 04/01/2023 Maree Krabbe, MD Requesting Physician: Dr. Lafonda Mosses  Reason for Consult: ESRD pt w/ vol overload and missed dialysis HPI: The patient is a 27 y.o. year-old w/ PMH as below who presented to ED for SOB. In ED Hr 80s, RR 25, NRB mask 12 L down to 3 L/min.  CXR showed IS edema. BNP 4000. Blood cx's taken, cardiology and nephrology consulted. Pt is admitted. We are asked to see for dialysis.   Pt seen in ED.  Not sure why he missed HD. Looking back his last outpt HD at his HD unit [facility] was on 7/09. In [facility] there is a short admission around 7/24 where he had 8.5 L paracentesis and presumably some dialysis. Comes to ED w/ SOB and edema. Poor historian. Lives with his sister. Takes transportation to HD. On HD since 2022.   Review of outpt HD records shows from 6/25 - 7/09 he was leaving 30-35kg over his dry wt of 105kg reliably.     ROS - denies CP, no joint pain, no HA, no blurry vision, no rash, no diarrhea, no nausea/ vomiting, no dysuria, no difficulty voiding   Past Medical History  Past Medical History:  Diagnosis Date   Diabetes mellitus without complication (HCC)    Type I   ESRD (end stage renal disease) (HCC)    hemodialysis initiated 02/28/21   Hypertension    Past Surgical History  Past Surgical History:  Procedure Laterality Date   AV FISTULA PLACEMENT Left 03/04/2021   Procedure: Basilic Vein Transposition Left upper arm fistula;  Surgeon: Larina Earthly, MD;  Location: [facility] OR;  Service: Vascular;  Laterality: Left;  PERIPHERAL NERVE BLOCK   IR PERC TUN PERIT CATH WO PORT S&I /IMAG  02/28/2021   IR US GUIDE VASC ACCESS RIGHT  02/28/2021   Family History  Family History  Problem Relation Age of Onset   Diabetes Mother    Kidney disease Other    Hypertension Other    Social History  reports that he has quit smoking. His smoking use included cigarettes. He has never  used smokeless tobacco. He reports current drug use. Drug: Marijuana. He reports that he does not drink alcohol. Allergies No Known Allergies Home medications Prior to Admission medications   Medication Sig Start Date End Date Taking? Authorizing Provider  acetaminophen (TYLENOL) 325 MG tablet Take 650 mg by mouth as needed for mild pain or moderate pain. 09/30/22  Yes [provider]  albuterol (VENTOLIN HFA) 108 (90 Base) MCG/ACT inhaler Inhale 2 puffs into the lungs every 6 (six) hours as needed for wheezing. 03/30/23 06/28/23 Yes [provider]  carvedilol (COREG) 25 MG tablet Take 1 tablet (25 mg total) by mouth 2 (two) times daily with a meal. 03/12/21  Yes Ghimire, Werner Lean, MD  cinacalcet (SENSIPAR) 30 MG tablet Take 30 mg by mouth daily.   Yes [provider]  gabapentin (NEURONTIN) 100 MG capsule Take 100 mg by mouth 3 (three) times daily. 03/24/23  Yes [provider]  insulin aspart (NOVOLOG) 100 UNIT/ML injection Inject 5 Units into the skin 3 (three) times daily before meals. 03/12/21  Yes Ghimire, Werner Lean, MD  isosorbide mononitrate (IMDUR) 60 MG 24 hr tablet Take 1 tablet (60 mg total) by mouth daily. Patient taking differently: Take 90 mg by mouth at bedtime. Take one and one-half tablets nightly 03/12/21  Yes Ghimire, Werner Lean, MD  lanthanum (FOSRENOL) 1000 MG chewable tablet Chew 1,000 mg by mouth 5 (five) times daily.   Yes [provider]  LOKELMA 10 g PACK packet Take 10 g by mouth daily. 03/24/23  Yes [provider]  sertraline (ZOLOFT) 50 MG tablet Take 50 mg by mouth daily. 03/26/23  Yes [provider]     Vitals:   04/01/23 1815 04/01/23 1830 04/01/23 1845 04/01/23 1936  BP: (!) 106/57 (!) 114/54 (!) 114/45 111/61  Pulse: 81 80 81 80  Resp: (!) 0 13 (!) 6 (!) 21  Temp:    98.2 F (36.8 C)  TempSrc:    Oral  SpO2: 94% 94% 95% 94%  Weight:       Exam Gen alert, no distress, nasal O2 at 5 L  No rash,  cyanosis or gangrene Sclera anicteric, throat clear  No jvd or bruits Chest rales R base, no ^wob RRR no MRG Abd soft ntnd no mass, possibly some ascites GU normal male MS no joint effusions or deformity Ext diffuse 2-3+ bilat pretib edema, also some hip edema Neuro is alert, Ox 3 , nf, no asterixis    LUA AVF+bruit     OP HD: [facility]   4h  500/2.0   105.3kg   2/2 bath  L AVF  Heparin 1-K+ 4K midrun - last OP HD on 7/09, post wt 134.5kg - last 2 wks prior to 7/09 was leaving 30kg over dry wt - venofer 50mg  weekly - hectorol 10 mcg IV three times per week - mircera 150 mcg IV q 2 wks, last on 7/02, due 7/16 - sensipar 30 every day at home/ fosrenol 1 gm tid ac  Assessment/ Plan: Volume overload/ pulm edema- massive chronic vol overload, last OP HD at his unit came off 30kg over. Not in distress but looks uncomfortable. Will plan HD tonight upstairs w/ 5 L UF goal.  ESRD - on HD TTS. Extra HD tonight, max UF.  Ascites - had 8.5 L LVP at [facility] on 7/25.  HTN/ volume - BP's soft, prob should hold home coreg and imdur. Marked vol overload as above.  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  MBD ckd - CCa in range, add on phos.  Severe nonadherence - poor prognosis    Vinson Moselle  MD [facility] 04/01/2023, 8:48 PM  Recent Labs  Lab 04/01/23 1601 04/01/23 1632  HGB 7.9* 7.8*  ALBUMIN <1.5*  --   CALCIUM 7.3*  --   CREATININE 11.82* 12.90*  K 5.7* 5.8*   Inpatient medications:  heparin  5,000 Units Subcutaneous Q8H   insulin aspart  0-5 Units Subcutaneous QHS   [START ON 04/02/2023] insulin aspart  0-9 Units Subcutaneous TID WC    acetaminophen **OR** acetaminophen, cyclobenzaprine, polyethylene glycol

## 2023-04-01 NOTE — Progress Notes (Signed)
  Echocardiogram 2D Echocardiogram has been performed.  Maren Reamer 04/01/2023, 5:08 PM

## 2023-04-01 NOTE — Hospital Course (Addendum)
 #  Acute hypoxemic respiratory failure #Heart failure with reduced ejection fraction exacerbation # Volume overload Patient presented due to shortness of breath and chest pain after missing dialysis.  Diuresis not appropriate at that time because patient cannot make urine.  Pt got dialyzed yesterday by nephrology twice on 2 separate days into 10.5 L out. Had another dialysis 7/30 and discharged home to follow-up on outpatient dialysis.  Patient is no longer shortness of breath and leg edema has been significantly improved no signs of pitting at this time.For his  heart failure we resumed patient on Entresto 50 mg twice daily.    #ESRD #Hyperkalemia #Uremia History of end-stage renal disease on hemodialysis on Tuesdays Thursdays and Saturdays.  Neurology is following and consult appreciated.  Hyperkalemia resolved, potassium now 4.7 -Continue on hemodialysis Tuesdays Thursdays and Saturdays -Follow-up on nephrology recs      #Left thigh pain Patient complains of new onset of left thigh pain that started a couple of hours ago after discharge.  Rates the pain as 10 out of 10, sharp and does not radiate anywhere.  Says that pain worsens with positional changes.  This is likely musculoskeletal than neurological due to the nature and description of the pain.  Flexeril was increased lidocaine patches was added and then gabapentin was increased to 100 mg 3 times daily.  Today 7/30 patient endorses improvement of the pain and says the pain is now 4/10.  Discharging with Flexeril lidocaine patch gabapentin 100 mg 3 times daily for 30 days

## 2023-04-01 NOTE — H&P (Cosign Needed)
Date: 04/01/2023               Patient Name:  Bobby [name] MRN: [id number]  DOB: [birth year] Age / Sex: 27 y.o., male   PCP: [facility]         Medical Service: Internal Medicine Teaching Service         Attending Physician: Dr. Mercie Eon     First Contact: Dr. Julien Nordmann  Pager: [phone]   Second Contact: Dr. Modena Slater  Pager: [phone]        After Hours (After 5p/  First Contact Pager: [phone]  weekends / holidays): Second Contact Pager: [phone]   Chief Complaint: SOB, chest pain   History of Present Illness: This is a 27 year old male with past medical history of HFrEF, ESRD on dialysis, type 1 diabetes, hypertension who presents to the emergency department concerns of shortness of breath and chest pain for the past 3 days.  He states that he was recently hospitalized after a fall at [facility], and developed shortness of breath Friday, March 30, 2023 morning when he was discharging.  He states he was also short of breath.  He states his chest pain comes and goes.  He states since discharge he has not been feeling well, and feels weak, short of breath, and has atypical chest pain.  He states the pain comes and goes, but is not related to exertion.  He states he does not normally make urine.  He states he has not been exerting himself recently as he has been in a lot of pain.  Patient did recently miss dialysis.  Per charting he also missed dialysis prior to admission to [facility].  He is unable to voice of reasoning behind why he missed dialysis.  He states he lives with his sister, who manages his medications.  He also reports having some lightheadedness and dizziness, but denies any other concerns.  He reports he has not been eating well, has had a poor appetite.  Otherwise, he states that he just does not feel well.  He denies any sick contacts.  He reports compliance with his medication.  ED course:  Patient arrived to  the emergency department with initial vital signs afebrile, pulse 80s, respirations 17-26 on initial rebreather mask 12 L/min down to 3 L/min.  Patient did have chest x-ray concerning for interstitial edema.  Patient did also have elevated BNP in the 4000's.  Blood cultures were taken. Given concern for heart failure exacerbation, cardiology and nephrology consulted during ED course. IMTS for consulted for admission.   PCP: [facility]   Meds:  No outpatient medications have been marked as taking for the 04/01/23 encounter [facility] Encounter).  Outpatient meds include: Sucroferric oxyhydroxide 500 mg 3 times daily Maalox Carvedilol 25 mg twice daily Cinacalcet 30 mg daily Cyclobenzaprine 10 mg 3 times daily Voltaren gel Entresto 97-100 mg twice daily Gabapentin 100 mg 3 times daily Hydralazine 100 mg 3 times daily Hydroxyzine 25 mg 3 times daily Imdur 90 mg nightly Fosrenol 1000 mg 3 times daily Lidocaine patch Robaxin 500 mg every 6 hours NovoLog 5 units with breakfast, lunch, dinner Sertraline 50 mg daily Zyrtec 10 mg daily  Allergies: Allergies as of 04/01/2023   (No Known Allergies)   Past Medical History:  Diagnosis Date   Diabetes mellitus without complication (HCC)    Type I   ESRD (end stage renal disease) (HCC)    hemodialysis  initiated 02/28/21   Hypertension     Family History:  Mother with diabetes.  Social History:  Lives with sister and nephew. Does not work or go to school currently. Needs assistance with ADLs and IADLs. Recently told to use a walker for walking.   Reports he does not use any alcohol, tobacco, or illicit drugs. Does endorse occasional vaping (flavored) and weekly marijuana use (smoked).   Sexually active, one partner. Not concerned for STI. Reported normal STI screenings in the past.   Review of Systems: A complete ROS was negative except as per HPI.      Latest Ref Rng & Units 04/01/2023    4:32 PM 04/01/2023    4:01  PM 08/11/2021   11:28 AM  CBC  WBC 4.0 - 10.5 K/uL  8.4    Hemoglobin 13.0 - 17.0 g/dL 7.8  7.9  82.9   Hematocrit 39.0 - 52.0 % 23.0  25.8  30.0   Platelets 150 - 400 K/uL  371         Latest Ref Rng & Units 04/01/2023    4:32 PM 04/01/2023    4:01 PM 08/11/2021   11:28 AM  CMP  Glucose 70 - 99 mg/dL 562  130  865   BUN 6 - 20 mg/dL 86  87  78   Creatinine 0.61 - 1.24 mg/dL 78.46  96.29  52.84   Sodium 135 - 145 mmol/L 136  137  140   Potassium 3.5 - 5.1 mmol/L 5.8  5.7  4.3   Chloride 98 - 111 mmol/L 96  93  93   CO2 22 - 32 mmol/L  28    Calcium 8.9 - 10.3 mg/dL  7.3    Total Protein 6.5 - 8.1 g/dL  5.7    Total Bilirubin 0.3 - 1.2 mg/dL  1.0    Alkaline Phos 38 - 126 U/L  228    AST 15 - 41 U/L  33    ALT 0 - 44 U/L  36      EKG: personally reviewed my interpretation is EKG with sinus rhythm, low voltage present on EKG 2022, and prolonged QT interval Qtc 474.   CXR: personally reviewed my interpretation is CXR showing cardiomegaly and increasing interstitial edema compatible with congestive heart failure.  Echocardiogram:  1. Left ventricular ejection fraction, by estimation, is 30 to 35% . The left ventricle has moderately decreased function. The left ventricle has no regional wall motion abnormalities. There is moderate left ventricular hypertrophy. Left ventricular diastolic parameters are consistent with Grade I diastolic dysfunction ( impaired relaxation) . 2. Right ventricular systolic function is normal. The right ventricular size is moderately enlarged. There is normal pulmonary artery systolic pressure.  3. Left atrial size was severely dilated.  4. Right atrial size was severely dilated.  5. Small to moderate pericardial effusion ( posterior predominant) with no tamponade physiology. Moderate pericardial effusion. The pericardial effusion is circumferential. There is no evidence of cardiac tamponade.  6. The mitral valve is normal in structure. No evidence of mitral  valve regurgitation. No evidence of mitral stenosis.  7. Tricuspid valve regurgitation is moderate.  8. The aortic valve is tricuspid. Aortic valve regurgitation is not visualized. No aortic stenosis is present.  9. The inferior vena cava is dilated in size with > 50% respiratory variability, suggesting right atrial pressure of 8 mmHg.  Physical Exam: Blood pressure (!) 114/45, pulse 81, temperature 98.5 F (36.9 C), temperature source Oral, resp. rate Marland Kitchen)  6, weight 122 kg, SpO2 95%. General: Patient resting in bed in mild distress, getting supplemental O2 via [place], abel to converse appropriately.  Cardiovascular: RRR, no rubs, murmurs, or gallops. JVD possibly to mandible. B/l LE 1+ pitting edema from the foot to below the knee.  Pulmonary: Posterior lower lobes with diffuse crackles. Increased respiratory effort with 3 L O2 via .  Abdominal: Abdomen mildly distended, non-tender, soft, positive bowel sounds on auscultation.  Psych: Normal mood, flat affect.  Skin: Warm and dry.    Assessment & Plan by Problem: Principal Problem:   Acute on chronic HFrEF (heart failure with reduced ejection fraction) (HCC) Active Problems:   Dependence on renal dialysis [facility])   Essential hypertension Acute respiratory hypoxic failure  Chest pain  This is a 27 year old male with a past medical history of HFrEF, ESRD on dialysis Tuesday, Thursday, Saturday, hypertension, type 1 diabetes who presents to the emergency department with concerns of chest pain and shortness of breath.  Patient admitted for further evaluation management of HFrEF exacerbation.  #HFrEF exacerbation #Acute hypoxemic respiratory failure #Pericardial effusion, likely uremic versus volume overload Patient evaluated bedside this evening.  Patient was weaned down to 3 L nasal cannula.  Patient was comfortable in bed, with nonlabored breathing.  Patient did feel little chest pains.  Reviewing labs, patient did have elevated BNP concern  for volume overload.  Patient did have echo as well showing reduced ejection fraction and pericardial effusion.  Upon history given, patient states he missed dialysis, and per chart review patient has missed dialysis multiple times.  Patient does state he has been compliant with his medications at home.  At baseline, patient does not make urine.  Patient does report some chest pain, but nonexertional, comes and goes.  Likely has heart failure exacerbation in setting of missing dialysis and volume overload.  New echocardiogram showing EF of 30 to 35%.  No regional wall motion abnormalities.  Did speak with EDP, who spoke with cardiologist who states did not think the previous echo had actually a 50-55% reading, and likely this was not an ischemic event leading to decreased ejection fraction.  Did consider pulmonary embolism, but less likely given patient was getting DVT prophylaxis in hospital, so less likely.  No concern for dissection.  No concern for acute coronary syndrome. Plan: -Patient does not make urine, would not benefit from diuresing -Nephrology following, will plan to diurese 7/29 -Cardiology following, who did bedside ultrasound -Hold GDMT medications which include carvedilol 25 mg twice daily, Entresto 97-103 mg twice daily, Imdur 90 mg nightly  -PT/OT eval/treat -Daily weights  #ESRD #Uremia #Hyperkalemia Patient is an ESRD patient, dialysis Tuesday, Thursday, Saturdays.  Likely underlying etiology of why patient is in HFrEF exacerbation at this time because he missed dialysis.  Nephrology already has been consulted, and plan will be for dialysis tomorrow.  Potassium at 5.7 Plan: -Monitor RFP -Nephrology following, appreciate recommendation -Likely dialysis tomorrow  #Type 1 diabetes mellitus Most recent A1c 6.2 on 03/28/2023.  At home, patient is on NovoLog 5 units with meals. Plan: -Start sliding scale insulin -Monitor CBGs  #Hypertension Patient borderline hypotensive.   Will avoid fluids to avoid further exacerbation.  Will continue to monitor.  Home medications include carvedilol 25 mg twice daily, Entresto 97-103 mg tablet twice daily, hydralazine 100 mg 3 times daily, Imdur 90 mg nightly Plan:  -Hold home medications -Monitor blood pressures closely  #Back pain Patient recently had a fall, and discharged on lidocaine patch, Robaxin, and  Flexeril.  Patient does report having some back pain.  No further falls.  Will have patient get up and work with PT/OT.  Will start back home lidocaine patch and Flexeril. Plan: -Restart lidocaine patch -Restart Flexeril -PT/OT following  #Depression/anxiety Patient is on sertraline 50 mg daily.  No acute concerns at this time. Plan:  -Resume home sertraline 50 mg daily  Diet: Renal, Carb, Heart Healthy  VTE: sq heparin Code: Full  Dispo: Admit patient to Observation with expected length of stay less than 2 midnights.  Signed: Modena Slater, DO PGY-2, Internal Medicine Teaching Program  04/01/2023, 6:49 PM  Pager: [phone]  Philomena Doheny, MD PGY-1, Internal Medicine Teaching Program   After 5pm on weekdays and 1pm on weekends: On Call pager: [phone]

## 2023-04-01 NOTE — ED Notes (Signed)
Patient transported to dialysis

## 2023-04-02 ENCOUNTER — Encounter (HOSPITAL_COMMUNITY): Payer: Self-pay | Admitting: Internal Medicine

## 2023-04-02 ENCOUNTER — Other Ambulatory Visit: Payer: Self-pay

## 2023-04-02 DIAGNOSIS — Z833 Family history of diabetes mellitus: Secondary | ICD-10-CM | POA: Diagnosis not present

## 2023-04-02 DIAGNOSIS — M25552 Pain in left hip: Secondary | ICD-10-CM | POA: Diagnosis present

## 2023-04-02 DIAGNOSIS — Z79899 Other long term (current) drug therapy: Secondary | ICD-10-CM | POA: Diagnosis not present

## 2023-04-02 DIAGNOSIS — M549 Dorsalgia, unspecified: Secondary | ICD-10-CM | POA: Diagnosis present

## 2023-04-02 DIAGNOSIS — J9601 Acute respiratory failure with hypoxia: Secondary | ICD-10-CM | POA: Diagnosis present

## 2023-04-02 DIAGNOSIS — N186 End stage renal disease: Secondary | ICD-10-CM | POA: Diagnosis present

## 2023-04-02 DIAGNOSIS — E669 Obesity, unspecified: Secondary | ICD-10-CM | POA: Diagnosis present

## 2023-04-02 DIAGNOSIS — E875 Hyperkalemia: Secondary | ICD-10-CM | POA: Diagnosis present

## 2023-04-02 DIAGNOSIS — W19XXXA Unspecified fall, initial encounter: Secondary | ICD-10-CM | POA: Diagnosis present

## 2023-04-02 DIAGNOSIS — F419 Anxiety disorder, unspecified: Secondary | ICD-10-CM | POA: Diagnosis present

## 2023-04-02 DIAGNOSIS — Z8249 Family history of ischemic heart disease and other diseases of the circulatory system: Secondary | ICD-10-CM | POA: Diagnosis not present

## 2023-04-02 DIAGNOSIS — F32A Depression, unspecified: Secondary | ICD-10-CM | POA: Diagnosis present

## 2023-04-02 DIAGNOSIS — N250 Renal osteodystrophy: Secondary | ICD-10-CM | POA: Diagnosis present

## 2023-04-02 DIAGNOSIS — I959 Hypotension, unspecified: Secondary | ICD-10-CM | POA: Diagnosis present

## 2023-04-02 DIAGNOSIS — Z992 Dependence on renal dialysis: Secondary | ICD-10-CM

## 2023-04-02 DIAGNOSIS — Z6835 Body mass index (BMI) 35.0-35.9, adult: Secondary | ICD-10-CM | POA: Diagnosis not present

## 2023-04-02 DIAGNOSIS — F1290 Cannabis use, unspecified, uncomplicated: Secondary | ICD-10-CM | POA: Diagnosis present

## 2023-04-02 DIAGNOSIS — E1022 Type 1 diabetes mellitus with diabetic chronic kidney disease: Secondary | ICD-10-CM | POA: Diagnosis present

## 2023-04-02 DIAGNOSIS — D631 Anemia in chronic kidney disease: Secondary | ICD-10-CM | POA: Diagnosis present

## 2023-04-02 DIAGNOSIS — I132 Hypertensive heart and chronic kidney disease with heart failure and with stage 5 chronic kidney disease, or end stage renal disease: Secondary | ICD-10-CM | POA: Diagnosis present

## 2023-04-02 DIAGNOSIS — I5023 Acute on chronic systolic (congestive) heart failure: Secondary | ICD-10-CM | POA: Diagnosis present

## 2023-04-02 LAB — CBC
HCT: 31.7 % — ABNORMAL LOW (ref 39.0–52.0)
Hemoglobin: 9.8 g/dL — ABNORMAL LOW (ref 13.0–17.0)
MCH: 26.5 pg (ref 26.0–34.0)
MCHC: 30.9 g/dL (ref 30.0–36.0)
MCV: 85.7 fL (ref 80.0–100.0)
Platelets: 457 10*3/uL — ABNORMAL HIGH (ref 150–400)
RBC: 3.7 MIL/uL — ABNORMAL LOW (ref 4.22–5.81)
RDW: 16 % — ABNORMAL HIGH (ref 11.5–15.5)
WBC: 9.4 10*3/uL (ref 4.0–10.5)
nRBC: 0 % (ref 0.0–0.2)

## 2023-04-02 LAB — CBG MONITORING, ED
Glucose-Capillary: 92 mg/dL (ref 70–99)
Glucose-Capillary: 92 mg/dL (ref 70–99)
Glucose-Capillary: 105 mg/dL — ABNORMAL HIGH (ref 70–99)

## 2023-04-02 LAB — GLUCOSE, CAPILLARY
Glucose-Capillary: 111 mg/dL — ABNORMAL HIGH (ref 70–99)
Glucose-Capillary: 120 mg/dL — ABNORMAL HIGH (ref 70–99)

## 2023-04-02 LAB — MAGNESIUM: Magnesium: 2.1 mg/dL (ref 1.7–2.4)

## 2023-04-02 MED ORDER — CARVEDILOL 25 MG PO TABS
25.0000 mg | ORAL_TABLET | Freq: Two times a day (BID) | ORAL | Status: DC
  Administered 2023-04-02 – 2023-04-03 (×2): 25 mg via ORAL
  Filled 2023-04-02 (×2): qty 1

## 2023-04-02 MED ORDER — LIDOCAINE 5 % EX PTCH
1.0000 | MEDICATED_PATCH | Freq: Every day | CUTANEOUS | Status: DC
  Administered 2023-04-02 – 2023-04-03 (×2): 1 via TRANSDERMAL
  Filled 2023-04-02 (×2): qty 1

## 2023-04-02 MED ORDER — HEPARIN SODIUM (PORCINE) 1000 UNIT/ML IJ SOLN
INTRAMUSCULAR | Status: AC
  Filled 2023-04-02: qty 8

## 2023-04-02 MED ORDER — DARBEPOETIN ALFA 150 MCG/0.3ML IJ SOSY
150.0000 ug | PREFILLED_SYRINGE | INTRAMUSCULAR | Status: DC
  Filled 2023-04-02 (×2): qty 0.3

## 2023-04-02 MED ORDER — GABAPENTIN 100 MG PO CAPS
100.0000 mg | ORAL_CAPSULE | Freq: Three times a day (TID) | ORAL | Status: DC
  Administered 2023-04-02 – 2023-04-03 (×4): 100 mg via ORAL
  Filled 2023-04-02 (×4): qty 1

## 2023-04-02 MED ORDER — HEPARIN SODIUM (PORCINE) 1000 UNIT/ML DIALYSIS
8000.0000 [IU] | Freq: Once | INTRAMUSCULAR | Status: AC
  Administered 2023-04-02: 8000 [IU] via INTRAVENOUS_CENTRAL

## 2023-04-02 MED ORDER — ACETAMINOPHEN 325 MG PO TABS
650.0000 mg | ORAL_TABLET | Freq: Four times a day (QID) | ORAL | Status: DC
  Administered 2023-04-02 – 2023-04-03 (×4): 650 mg via ORAL
  Filled 2023-04-02 (×4): qty 2

## 2023-04-02 MED ORDER — HEPARIN SODIUM (PORCINE) 1000 UNIT/ML DIALYSIS
4000.0000 [IU] | INTRAMUSCULAR | Status: AC | PRN
  Administered 2023-04-02: 4000 [IU] via INTRAVENOUS_CENTRAL
  Filled 2023-04-02: qty 4

## 2023-04-02 MED ORDER — DICLOFENAC SODIUM 1 % EX GEL
4.0000 g | Freq: Four times a day (QID) | CUTANEOUS | Status: DC
  Administered 2023-04-03: 4 g via TOPICAL
  Filled 2023-04-02: qty 100

## 2023-04-02 NOTE — Evaluation (Signed)
Physical Therapy Evaluation Patient Details Name: Bobby [name] MRN: [id number] DOB: 07/08/1996 Today's Date: 04/02/2023  History of Present Illness  The pt is a 27 yo male presenting 7/28 with hypoxia and CHF exacerbation. Pt d/c from [facility] 7/26 and missed HD session between d/c and admission due to feeling ill. PMH includes: ESRD on HD, HFrEF, type 1 diabetes, HTN, and obesity.   Clinical Impression  Pt in bed upon arrival of PT, agreeable to evaluation at this time. Prior to admission the pt was mobilizing at home with intermittent use of RW, reports he is mostly able to complete ADLs without assist, but does live with a sister who can assist before/after work as needed. The pt now presents with limitations in functional mobility, ROM and strength of BLE, endurance, and dynamic stability due to above dx, and will continue to benefit from skilled PT to address these deficits. The pt required modA to rise to standing from EOB, and then required BUE support to maintain balance with ambulation. The pt demos significant difficulty with movement and ROM of BLE, largely due to edema, and will benefit from continued skilled PT to address ROM, strength, and continued mobility of RLE to reduce soreness and allow for more independent movements. Will work towards return home, pt will need continued skilled PT acutely to progress independence with transfers and endurance to allow for safe mobility in the home while his sister is at work.          If plan is discharge home, recommend the following: A little help with walking and/or transfers;A little help with bathing/dressing/bathroom;Assist for transportation;Help with stairs or ramp for entrance   Can travel by private vehicle        Equipment Recommendations BSC/3in1  Recommendations for Other Services       Functional Status Assessment Patient has had a recent decline in their functional status and demonstrates the ability to make  significant improvements in function in a reasonable and predictable amount of time.     Precautions / Restrictions Precautions Precautions: Fall Precaution Comments: dizzy with ambulation, check orthostatics Restrictions Weight Bearing Restrictions: No      Mobility  Bed Mobility Overal bed mobility: Needs Assistance Bed Mobility: Sit to Supine, Supine to Sit     Supine to sit: Min assist Sit to supine: Mod assist   General bed mobility comments: Assist for lifting LEs into the bed.    Transfers Overall transfer level: Needs assistance Equipment used: Rolling walker (2 wheels) Transfers: Bed to chair/wheelchair/BSC, Sit to/from Stand Sit to Stand: Mod assist   Step pivot transfers: Mod assist       General transfer comment: Mod for sit to stand with min assist to min guard for functional mobility using the RW secondary to dizziness.    Ambulation/Gait Ambulation/Gait assistance: Min guard, Min assist Gait Distance (Feet): 60 Feet Assistive device: Rolling walker (2 wheels) Gait Pattern/deviations: Step-through pattern, Decreased stride length, Decreased dorsiflexion - right, Decreased dorsiflexion - left, Shuffle Gait velocity: decreased Gait velocity interpretation: <1.31 ft/sec, indicative of household ambulator   General Gait Details: minimal clearance and hip/knee flexion. pt reaching for UE support after first few steps and given RW. minG-minA to steady with gait, pt stopping to stretch his back frequently. reports dizzy and needed seated rest break in hallway, no BP nearby to take in moment and once he was back to room BP stable    Balance Overall balance assessment: Needs assistance     Sitting  balance - Comments: no UE support, poor tolerance for challenge   Standing balance support: No upper extremity supported, Bilateral upper extremity supported, During functional activity   Standing balance comment: can stand without UE support, BUe support for  gait                             Pertinent Vitals/Pain Pain Assessment Pain Assessment: Faces Pain Score: 4  Faces Pain Scale: Hurts little more Pain Location: right thigh Pain Descriptors / Indicators: Discomfort, Grimacing Pain Intervention(s): Limited activity within patient's tolerance, Monitored during session, Repositioned    Home Living Family/patient expects to be discharged to:: Private residence Living Arrangements: Other relatives (sister and nephew) Available Help at Discharge: Family;Available PRN/intermittently Type of Home: House Home Access: Stairs to enter Entrance Stairs-Rails: None Entrance Stairs-Number of Steps: 1   Home Layout: One level Home Equipment: Tub bench;Rolling Walker (2 wheels)      Prior Function Prior Level of Function : Independent/Modified Independent;History of Falls (last six months)             Mobility Comments: independent, limited activity but not using RW typically ADLs Comments: independent, tries to do ADLs and cooking at home     Hand Dominance   Dominant Hand: Right    Extremity/Trunk Assessment   Upper Extremity Assessment Upper Extremity Assessment: Defer to OT evaluation    Lower Extremity Assessment Lower Extremity Assessment: RLE deficits/detail;LLE deficits/detail RLE Deficits / Details: reports R thigh pain (from incident playing with son, states no bony injury determined at [facility]), significant swelling, pt unable to raise against gravity given weight of LE. partial ROM with assistance. RLE Sensation: WNL RLE Coordination: WNL LLE Deficits / Details: reports L hip sore due to position on stretcher, sensation intact. significant edema and pt needing assist to complete ROM against gravity. LLE Sensation: WNL    Cervical / Trunk Assessment Cervical / Trunk Assessment: Normal  Communication   Communication: No difficulties  Cognition Arousal/Alertness: Awake/alert Behavior During Therapy: WFL for  tasks assessed/performed Overall Cognitive Status: Within Functional Limits for tasks assessed                                          General Comments General comments (skin integrity, edema, etc.): BP stable in bed prior to mobility and after mobility, pt reports dizziness with standing, possibly orthostatic.    Exercises Other Exercises Other Exercises: LLE supine heel slides with assistance and overpressure to stretch glutes/hip.   Assessment/Plan    PT Assessment Patient needs continued PT services  PT Problem List Decreased strength;Decreased range of motion;Decreased activity tolerance;Decreased balance;Decreased mobility;Obesity       PT Treatment Interventions DME instruction;Gait training;Functional mobility training;Therapeutic activities;Therapeutic exercise;Balance training    PT Goals (Current goals can be found in the Care Plan section)  Acute Rehab PT Goals Patient Stated Goal: return home, to be able to play with his 6yo son PT Goal Formulation: With patient Time For Goal Achievement: 04/16/23 Potential to Achieve Goals: Good    Frequency Min 1X/week     Co-evaluation PT/OT/SLP Co-Evaluation/Treatment: Yes Reason for Co-Treatment: For patient/therapist safety;To address functional/ADL transfers PT goals addressed during session: Mobility/safety with mobility OT goals addressed during session: ADL's and self-care       AM-PAC PT "6 Clicks" Mobility  Outcome Measure Help needed turning from  your back to your side while in a flat bed without using bedrails?: A Little Help needed moving from lying on your back to sitting on the side of a flat bed without using bedrails?: A Little Help needed moving to and from a bed to a chair (including a wheelchair)?: A Little Help needed standing up from a chair using your arms (e.g., wheelchair or bedside chair)?: A Little Help needed to walk in hospital room?: A Little Help needed climbing 3-5 steps  with a railing? : A Lot 6 Click Score: 17    End of Session Equipment Utilized During Treatment: Gait belt Activity Tolerance: Patient tolerated treatment well;Patient limited by fatigue Patient left: in bed;with call bell/phone within reach Nurse Communication: Mobility status PT Visit Diagnosis: Other abnormalities of gait and mobility (R26.89);Muscle weakness (generalized) (M62.81)    Time: 1051-1130 PT Time Calculation (min) (ACUTE ONLY): 39 min   Charges:   PT Evaluation $PT Eval Low Complexity: 1 Low PT Treatments $Gait Training: 8-22 mins PT General Charges $$ ACUTE PT VISIT: 1 Visit         Vickki Muff, PT, DPT   Acute Rehabilitation Department Office [phone] Secure Chat Communication Preferred  Bobby [name] 04/02/2023, 12:57 PM

## 2023-04-02 NOTE — ED Notes (Signed)
Pt returned from dialysis

## 2023-04-02 NOTE — Progress Notes (Signed)
Heart Failure Navigator Progress Note  Assessed for Heart & Vascular TOC clinic readiness.  Patient does not meet criteria due to ESRD on hemodialysis. EF 35-40%  Navigator will sign off at this time.   Rhae Hammock, BSN, Scientist, clinical (histocompatibility and immunogenetics) Only

## 2023-04-02 NOTE — Progress Notes (Addendum)
   Subjective: 27 year old male, hx of ESRD on HD Tu/Th/Sat ,Type 1 diabetes ,HTN presented to the ED due to concerns for SOB and chest pain and found to have volume overload likely in the setting of missing dialysis on Saturday . Of not, patient said he wasn't feeling well and couldn't show up for his dialysis. Today, he is complaining of a 10/10 sharp pain around his left hip, said the pain started after he got admitted.  Other than that  patient denies any chest pain shortness of breath, dizziness says he feels much better overall as compared to yesterday     Objective:  Vital signs in last 24 hours: Vitals:   04/02/23 1245 04/02/23 1300 04/02/23 1305 04/02/23 1424  BP:  117/66  132/66  Pulse: 91 93  93  Resp: (!) 24 (!) 24  18  Temp:   98.3 F (36.8 C) 98.4 F (36.9 C)  TempSrc:   Oral Oral  SpO2: (!) 86% 95%  93%  Weight:       General: Resting in bed , mildly agitated due to thigh pain on room air, able to converse appropriately.  Cardiovascular: Improved  leg edema edema no signs of pitting Pulmonary:Crackles noted in posterior posterior lobes Abdominal: Mild distention noted nontender  Psych: Normal mood and affect  Skin: Warm and dry  Assessment/Plan:  Principal Problem:   Acute on chronic HFrEF (heart failure with reduced ejection fraction) (HCC) Active Problems:   Dependence on renal dialysis (HCC)   Essential hypertension   Acute respiratory failure with hypoxia (HCC)   ESRD on dialysis (HCC)  #Acute hypoxemic respiratory failure #Heart failure with reduced ejection fraction exacerbation # Volume overload Patient presented due to shortness of breath and chest pain after missing dialysis.  Diuresis was not appropriate at that time because patient cannot make urine due to ESRD.  Pt got dialyzed yesterday by nephrology and  took 5.5 L out. He is satting well on room air and in no acute distress at this time. Plan - Ultra filtration today per Nephrology - HD  tomorrow  - Monitor daily weights - Resume Coreg - Resume GDMT as tolerated    #ESRD #Hyperkalemia #Uremia History of end-stage renal disease on hemodialysis on Tuesdays Thursdays and Saturdays.  Neurology is following and consult appreciated.  Hyperkalemia resolved, potassium now 4.7 -Continue on hemodialysis Tuesdays Thursdays and Saturdays -Follow-up on nephrology recs    #Left thigh pain Patient complains of new onset of left thigh pain that started a couple of hours ago after discharge.  Rates the pain as 10 out of 10, sharp and does not radiate anywhere.  Says that pain worsens with positional changes.  This is likely musculoskeletal than neurological due to the nature and description of the pain - Increase home Flexeril  - Lidocaine Patch - Gabapentin 100 mg TID   Prior to Admission Living Arrangement: Home in [facility]  Anticipated Discharge Location: Home in [place]  Barriers to Discharge:Medical management for clinical improvement  Dispo: Anticipated discharge in approximately 1-2 day(s).   Kathleen Lime, MD 04/02/2023, 5:02 PM Pager: @MYPAGER @ After 5pm on weekdays and 1pm on weekends: On Call pager [phone]

## 2023-04-02 NOTE — Evaluation (Signed)
Occupational Therapy Evaluation Patient Details Name: Bobby [name] MRN: [id number] DOB: [birth year] Today's Date: 04/02/2023   History of Present Illness The pt is a 27 yo male presenting 7/28 with hypoxia and CHF exacerbation. Pt d/c from [facility] 7/26 and missed HD session between d/c and admission due to feeling ill. PMH includes: ESRD on HD, HFrEF, type 1 diabetes, HTN, and obesity.   Clinical Impression   Pt currently at min to mod assist for sit to stand from the stretcher with min assist for functional transfers with use of the RW for support.  LB selfcare at overall mod assist secondary to right thigh pain and decreased flexibility.  BP stable in sitting at 119/65 and 123/62 but pt did report some dizziness in standing and with mobility.  Pt lives with his sister and she assists PRN.  Feel pt will benefit from acute care OT at this time to help increase ADL independence and overall activity tolerance.  Recommend HHOT eval at home once discharged to continue progression.       Recommendations for follow up therapy are one component of a multi-disciplinary discharge planning process, led by the attending physician.  Recommendations may be updated based on patient status, additional functional criteria and insurance authorization.   Assistance Recommended at Discharge Frequent or constant Supervision/Assistance  Patient can return home with the following A little help with walking and/or transfers;A little help with bathing/dressing/bathroom;Assistance with cooking/housework;Assist for transportation;Help with stairs or ramp for entrance    Functional Status Assessment  Patient has had a recent decline in their functional status and demonstrates the ability to make significant improvements in function in a reasonable and predictable amount of time.  Equipment Recommendations  BSC/3in1       Precautions / Restrictions Precautions Precautions: Fall Precaution Comments:  fell ~1 week ago, dizzy with ambulation, check orthostatics Restrictions Weight Bearing Restrictions: No      Mobility Bed Mobility Overal bed mobility: Needs Assistance Bed Mobility: Sit to Supine       Sit to supine: Mod assist   General bed mobility comments: Assist for lifting LEs into the bed.    Transfers Overall transfer level: Needs assistance Equipment used: Rolling walker (2 wheels) Transfers: Bed to chair/wheelchair/BSC, Sit to/from Stand Sit to Stand: Mod assist     Step pivot transfers: Mod assist     General transfer comment: Mod for sit to stand with min assist to min guard for functional mobility using the RW secondary to dizziness.      Balance Overall balance assessment: Needs assistance Sitting-balance support: Feet supported, No upper extremity supported Sitting balance-Leahy Scale: Fair       Standing balance-Leahy Scale: Poor Standing balance comment: UE support needed for functional mobility.                           ADL either performed or assessed with clinical judgement   ADL Overall ADL's : Needs assistance/impaired Eating/Feeding: Independent;Sitting   Grooming: Wash/dry hands;Wash/dry face;Sitting;Set up   Upper Body Bathing: Set up;Sitting   Lower Body Bathing: Moderate assistance;Sit to/from stand   Upper Body Dressing : Set up;Sitting   Lower Body Dressing: Maximal assistance;Sit to/from stand Lower Body Dressing Details (indicate cue type and reason): for donning and doffing gripper socks Toilet Transfer: Moderate assistance;Ambulation;Rolling walker (2 wheels) Toilet Transfer Details (indicate cue type and reason): simulated, pt declined need to toilet         Functional  mobility during ADLs: Minimal assistance;Rolling walker (2 wheels) General ADL Comments: Pt's BP 119/65 and 123/62 in sitting.  Oxygen sats stayed about 91% on room air with mobility.  HR in the min 90s with pt reporting dizziness in standing  and with mobility.  Unable to obtain standing BP this session.  Pt with increased right hip pain making donning socks difficult.  Will benefit from AE education to assist with this on bad days when fluid is limiting his flexibility.     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) Ability to See in Adequate Light: 0 Adequate Patient Visual Report: No change from baseline Vision Assessment?: Vision impaired- to be further tested in functional context     Perception  [facility]   Praxis  [facility]    Pertinent Vitals/Pain Pain Assessment Pain Assessment: Faces Faces Pain Scale: Hurts little more Pain Location: right thigh Pain Descriptors / Indicators: Discomfort, Grimacing Pain Intervention(s): Limited activity within patient's tolerance, Monitored during session, Repositioned     Hand Dominance Right   Extremity/Trunk Assessment Upper Extremity Assessment Upper Extremity Assessment: Overall WFL for tasks assessed   Lower Extremity Assessment Lower Extremity Assessment: Defer to PT evaluation   Cervical / Trunk Assessment Cervical / Trunk Assessment: Normal   Communication Communication Communication: No difficulties   Cognition Arousal/Alertness: Awake/alert Behavior During Therapy: WFL for tasks assessed/performed Overall Cognitive Status: Within Functional Limits for tasks assessed                                                  Home Living Family/patient expects to be discharged to:: Private residence Living Arrangements: Other relatives (sister and nephew) Available Help at Discharge: Family;Available PRN/intermittently Type of Home: House Home Access: Stairs to enter Entrance Stairs-Number of Steps: 1 Entrance Stairs-Rails: None Home Layout: One level     Bathroom Shower/Tub: Chief Strategy Officer: Standard     Home Equipment: Agricultural consultant (2 wheels);Tub bench          Prior  Functioning/Environment Prior Level of Function : Independent/Modified Independent;History of Falls (last six months)             Mobility Comments: independent, limited activity but not using RW typically ADLs Comments: independent, tries to do ADLs and cooking at home.  Pt says 25% of the time his sister has to help him with putting on socks when his fluid increases.        OT Problem List: Impaired balance (sitting and/or standing);Pain;Decreased activity tolerance;Decreased strength;Decreased knowledge of use of DME or AE      OT Treatment/Interventions: Self-care/ADL training;Patient/family education;Balance training;Neuromuscular education;Therapeutic activities;DME and/or AE instruction    OT Goals(Current goals can be found in the care plan section) Acute Rehab OT Goals Patient Stated Goal: Pt wants to get stronger, but no specific goals stated OT Goal Formulation: With patient Time For Goal Achievement: 04/16/23 Potential to Achieve Goals: Good  OT Frequency: Min 1X/week    Co-evaluation PT/OT/SLP Co-Evaluation/Treatment: Yes Reason for Co-Treatment: For patient/therapist safety;To address functional/ADL transfers   OT goals addressed during session: ADL's and self-care      AM-PAC OT "6 Clicks" Daily Activity     Outcome Measure Help from another person eating meals?: None Help from another person taking care of personal grooming?: A Little Help from another  person toileting, which includes using toliet, bedpan, or urinal?: A Little Help from another person bathing (including washing, rinsing, drying)?: A Little Help from another person to put on and taking off regular upper body clothing?: A Little Help from another person to put on and taking off regular lower body clothing?: A Lot 6 Click Score: 18   End of Session Equipment Utilized During Treatment: Gait belt;Rolling walker (2 wheels) Nurse Communication: Mobility status  Activity Tolerance: Other (comment)  (Limited secondary to dizziness) Patient left: in bed;with call bell/phone within reach  OT Visit Diagnosis: Unsteadiness on feet (R26.81);Other abnormalities of gait and mobility (R26.89);Muscle weakness (generalized) (M62.81);Pain Pain - Right/Left: Right Pain - part of body: Leg                Time: 3664-4034 OT Time Calculation (min): 29 min Charges:  OT General Charges $OT Visit: 1 Visit OT Evaluation $OT Eval Moderate Complexity: 1 Mod Perrin Maltese, OTR/L Acute Rehabilitation Services  Office [phone] 04/02/2023

## 2023-04-02 NOTE — ED Notes (Signed)
ED TO INPATIENT HANDOFF REPORT  ED Nurse Name and Phone #: [phone]  S Name/Age/Gender Bobby [name] 27 y.o. male Room/Bed: 041C/041C  Code Status   Code Status: Full Code  Home/SNF/Other Home Patient oriented to: self, place, time, and situation Is this baseline? Yes   Triage Complete: Triage complete  Chief Complaint Pericardial effusion [I31.39] Acute respiratory failure with hypoxia (HCC) [J96.01] Acute on chronic HFrEF (heart failure with reduced ejection fraction) (HCC) [I50.23] Acute congestive heart failure, unspecified heart failure type (HCC) [I50.9]  Triage Note No notes on file   Allergies No Known Allergies  Level of Care/Admitting Diagnosis ED Disposition     ED Disposition  Admit   Condition  --   Comment  Hospital Area: [facility] [[id number]]  Level of Care: Telemetry Medical [104]  May place patient in observation at [facility] or [facility] if equivalent level of care is available:: No  Covid Evaluation: Asymptomatic - no recent exposure (last 10 days) testing not required  Diagnosis: Acute on chronic HFrEF (heart failure with reduced ejection fraction) [facility]) [[id number]]  Admitting Physician: Mercie Eon [[id number]]  Attending Physician: Mercie Eon [[id number]]          B Medical/Surgery History Past Medical History:  Diagnosis Date   Diabetes mellitus without complication (HCC)    Type I   ESRD (end stage renal disease) (HCC)    hemodialysis initiated 02/28/21   Hypertension    Past Surgical History:  Procedure Laterality Date   AV FISTULA PLACEMENT Left 03/04/2021   Procedure: Basilic Vein Transposition Left upper arm fistula;  Surgeon: Larina Earthly, MD;  Location: [facility] OR;  Service: Vascular;  Laterality: Left;  PERIPHERAL NERVE BLOCK   IR PERC TUN PERIT CATH WO PORT S&I /IMAG  02/28/2021   IR US GUIDE VASC ACCESS RIGHT  02/28/2021     A IV Location/Drains/Wounds Patient Lines/Drains/Airways Status      Active Line/Drains/Airways     Name Placement date Placement time Site Days   Peripheral IV 04/01/23 20 G 1" Right Hand 04/01/23  1900  Hand  1   Fistula / Graft Left Upper arm Arteriovenous fistula 03/04/21  1327  Upper arm  759   Hemodialysis Catheter Right Internal jugular Double lumen Permanent (Tunneled) 02/28/21  1537  Internal jugular  763   Incision (Closed) 03/04/21 Arm Left 03/04/21  1203  -- 759            Intake/Output Last 24 hours  Intake/Output Summary (Last 24 hours) at 04/02/2023 1219 Last data filed at 04/02/2023 0446 Gross per 24 hour  Intake 240 ml  Output 5500 ml  Net -5260 ml    Labs/Imaging Results for orders placed or performed during the hospital encounter of 04/01/23 (from the past 48 hour(s))  Comprehensive metabolic panel     Status: Abnormal   Collection Time: 04/01/23  4:01 PM  Result Value Ref Range   Sodium 137 135 - 145 mmol/L   Potassium 5.7 (H) 3.5 - 5.1 mmol/L   Chloride 93 (L) 98 - 111 mmol/L   CO2 28 22 - 32 mmol/L   Glucose, Bld 119 (H) 70 - 99 mg/dL    Comment: Glucose reference range applies only to samples taken after fasting for at least 8 hours.   BUN 87 (H) 6 - 20 mg/dL   Creatinine, Ser 60.10 (H) 0.61 - 1.24 mg/dL   Calcium 7.3 (L) 8.9 - 10.3 mg/dL   Total Protein 5.7 (L) 6.5 -  8.1 g/dL   Albumin <5.2 (L) 3.5 - 5.0 g/dL   AST 33 15 - 41 U/L   ALT 36 0 - 44 U/L   Alkaline Phosphatase 228 (H) 38 - 126 U/L   Total Bilirubin 1.0 0.3 - 1.2 mg/dL   GFR, Estimated 5 (L) >60 mL/min    Comment: (NOTE) Calculated using the CKD-EPI Creatinine Equation (2021)    Anion gap 16 (H) 5 - 15    Comment: Performed at [facility], [address], [place], [place] [address]  CBC with Differential     Status: Abnormal   Collection Time: 04/01/23  4:01 PM  Result Value Ref Range   WBC 8.4 4.0 - 10.5 K/uL   RBC 2.92 (L) 4.22 - 5.81 MIL/uL   Hemoglobin 7.9 (L) 13.0 - 17.0 g/dL   HCT 44.0 (L) 10.2 - 72.5 %   MCV 88.4 80.0 - 100.0 fL    MCH 27.1 26.0 - 34.0 pg   MCHC 30.6 30.0 - 36.0 g/dL   RDW 36.6 (H) 44.0 - 34.7 %   Platelets 371 150 - 400 K/uL   nRBC 0.0 0.0 - 0.2 %   Neutrophils Relative % 79 %   Neutro Abs 6.7 1.7 - 7.7 K/uL   Lymphocytes Relative 8 %   Lymphs Abs 0.7 0.7 - 4.0 K/uL   Monocytes Relative 10 %   Monocytes Absolute 0.8 0.1 - 1.0 K/uL   Eosinophils Relative 2 %   Eosinophils Absolute 0.2 0.0 - 0.5 K/uL   Basophils Relative 0 %   Basophils Absolute 0.0 0.0 - 0.1 K/uL   Immature Granulocytes 1 %   Abs Immature Granulocytes 0.05 0.00 - 0.07 K/uL    Comment: Performed at [facility], [address], [place], [place] [address]  Protime-INR     Status: Abnormal   Collection Time: 04/01/23  4:01 PM  Result Value Ref Range   Prothrombin Time 20.2 (H) 11.4 - 15.2 seconds   INR 1.7 (H) 0.8 - 1.2    Comment: (NOTE) INR goal varies based on device and disease states. Performed at [facility], [address] [place], [place] [address]   APTT     Status: Abnormal   Collection Time: 04/01/23  4:01 PM  Result Value Ref Range   aPTT 48 (H) 24 - 36 seconds    Comment:        IF BASELINE aPTT IS ELEVATED, SUGGEST PATIENT RISK ASSESSMENT BE USED TO DETERMINE APPROPRIATE ANTICOAGULANT THERAPY. Performed at [facility], [address], [place], [place] [address]   Brain natriuretic peptide     Status: Abnormal   Collection Time: 04/01/23  4:01 PM  Result Value Ref Range   B Natriuretic Peptide 4,219.5 (H) 0.0 - 100.0 pg/mL    Comment: Performed at [facility], [address], [place], [place] [address]  Troponin I (High Sensitivity)     Status: Abnormal   Collection Time: 04/01/23  4:01 PM  Result Value Ref Range   Troponin I (High Sensitivity) 88 (H) <18 ng/L    Comment: (NOTE) Elevated high sensitivity troponin I (hsTnI) values and significant  changes across serial measurements may suggest ACS but many other  chronic and acute conditions are known to elevate hsTnI  results.  Refer to the "Links" section for chest pain algorithms and additional  guidance. Performed at [facility], [address] [place], [place] [address]   I-Stat  Lactic Acid, ED     Status: None   Collection Time: 04/01/23  4:32 PM  Result Value Ref Range   Lactic Acid, Venous 0.7 0.5 - 1.9 mmol/L  I-Stat Chem 8, ED     Status: Abnormal   Collection Time: 04/01/23  4:32 PM  Result Value Ref Range   Sodium 136 135 - 145 mmol/L   Potassium 5.8 (H) 3.5 - 5.1 mmol/L   Chloride 96 (L) 98 - 111 mmol/L   BUN 86 (H) 6 - 20 mg/dL   Creatinine, Ser 81.19 (H) 0.61 - 1.24 mg/dL   Glucose, Bld 147 (H) 70 - 99 mg/dL    Comment: Glucose reference range applies only to samples taken after fasting for at least 8 hours.   Calcium, Ion 0.92 (L) 1.15 - 1.40 mmol/L   TCO2 31 22 - 32 mmol/L   Hemoglobin 7.8 (L) 13.0 - 17.0 g/dL   HCT 82.9 (L) 56.2 - 13.0 %  Troponin I (High Sensitivity)     Status: Abnormal   Collection Time: 04/01/23  6:26 PM  Result Value Ref Range   Troponin I (High Sensitivity) 79 (H) <18 ng/L    Comment: (NOTE) Elevated high sensitivity troponin I (hsTnI) values and significant  changes across serial measurements may suggest ACS but many other  chronic and acute conditions are known to elevate hsTnI results.  Refer to the "Links" section for chest pain algorithms and additional  guidance. Performed at [facility], [address] [place], [place] [address]   CBG monitoring, ED     Status: Abnormal   Collection Time: 04/01/23  9:18 PM  Result Value Ref Range   Glucose-Capillary 116 (H) 70 - 99 mg/dL    Comment: Glucose reference range applies only to samples taken after fasting for at least 8 hours.  HIV Antibody (routine testing w rflx)     Status: None   Collection Time: 04/02/23  1:00 AM  Result Value Ref Range   HIV Screen 4th Generation wRfx Non Reactive Non Reactive    Comment: Performed at [facility], [address] [place],  [place] [address]  Hepatitis B surface antigen     Status: None   Collection Time: 04/02/23  1:00 AM  Result Value Ref Range   Hepatitis B Surface Ag NON REACTIVE NON REACTIVE    Comment: Performed at [facility], [address], [place], [place] [address]  CBG monitoring, ED     Status: None   Collection Time: 04/02/23  4:39 AM  Result Value Ref Range   Glucose-Capillary 92 70 - 99 mg/dL    Comment: Glucose reference range applies only to samples taken after fasting for at least 8 hours.  Renal function panel     Status: Abnormal   Collection Time: 04/02/23  5:02 AM  Result Value Ref Range   Sodium 136 135 - 145 mmol/L   Potassium 4.7 3.5 - 5.1 mmol/L   Chloride 93 (L) 98 - 111 mmol/L   CO2 30 22 - 32 mmol/L   Glucose, Bld 98 70 - 99 mg/dL    Comment: Glucose reference range applies only to samples taken after fasting for at least 8 hours.   BUN 57 (H) 6 - 20 mg/dL   Creatinine, Ser 1.32 (H) 0.61 - 1.24 mg/dL   Calcium 7.7 (L) 8.9 - 10.3 mg/dL   Phosphorus 6.5 (H) 2.5 - 4.6 mg/dL   Albumin <4.4 (L) 3.5 - 5.0  g/dL   GFR, Estimated 8 (L) >60 mL/min    Comment: (NOTE) Calculated using the CKD-EPI Creatinine Equation (2021)    Anion gap 13 5 - 15    Comment: Performed at [facility], [address] [place], [place] [address]  CBC     Status: Abnormal   Collection Time: 04/02/23  5:02 AM  Result Value Ref Range   WBC 9.4 4.0 - 10.5 K/uL   RBC 3.70 (L) 4.22 - 5.81 MIL/uL   Hemoglobin 9.8 (L) 13.0 - 17.0 g/dL   HCT 84.6 (L) 96.2 - 95.2 %   MCV 85.7 80.0 - 100.0 fL   MCH 26.5 26.0 - 34.0 pg   MCHC 30.9 30.0 - 36.0 g/dL   RDW 84.1 (H) 32.4 - 40.1 %   Platelets 457 (H) 150 - 400 K/uL   nRBC 0.0 0.0 - 0.2 %    Comment: Performed at [facility], [address], [place], [place] [address]  Magnesium     Status: None   Collection Time: 04/02/23  5:02 AM  Result Value Ref Range   Magnesium 2.1 1.7 - 2.4 mg/dL    Comment: Performed at [facility], [address], [place], [place] [address]  CBG monitoring, ED     Status: None   Collection Time: 04/02/23  7:47 AM  Result Value Ref Range   Glucose-Capillary 92 70 - 99 mg/dL    Comment: Glucose reference range applies only to samples taken after fasting for at least 8 hours.   ECHOCARDIOGRAM COMPLETE  Result Date: 04/01/2023    ECHOCARDIOGRAM REPORT   Patient Name:   Bobby [name] Date of Exam: 04/01/2023 Medical Rec #:  [id number]          Height:       77.0 in Accession #:    [id number]         Weight:       269.0 lb Date of Birth:  11-15-1995           BSA:          2.537 m Patient Age:    27 years           BP:           120/53 mmHg Patient Gender: M                  HR:           81 bpm. Exam Location:  Inpatient Procedure: 2D Echo, Cardiac Doppler, Color Doppler and 3D Echo STAT ECHO Indications:    pericardial effusion  History:        Patient has no prior history of Echocardiogram examinations.                 Chronic kidney disease, Signs/Symptoms:Edema; Risk                 Factors:Diabetes and Hypertension.  Sonographer:    Aron Baba Referring Phys: [id number] SCOTT GOLDSTON IMPRESSIONS  1. Left ventricular ejection fraction, by estimation, is 30 to 35%. The left ventricle has moderately decreased function. The left ventricle has no regional wall motion abnormalities. There is moderate left ventricular hypertrophy. Left ventricular diastolic parameters are consistent with Grade I diastolic dysfunction (impaired relaxation).  2. Right ventricular systolic function is normal. The right ventricular size is moderately enlarged. There is normal pulmonary artery systolic pressure.  3. Left atrial size was severely dilated.  4. Right  atrial size was severely dilated.  5. Small to moderate pericardial effusion (posterior predominant) with no tamponade physiology. Moderate pericardial effusion. The pericardial effusion is circumferential. There is no evidence of cardiac tamponade.  6. The mitral valve is normal in  structure. No evidence of mitral valve regurgitation. No evidence of mitral stenosis.  7. Tricuspid valve regurgitation is moderate.  8. The aortic valve is tricuspid. Aortic valve regurgitation is not visualized. No aortic stenosis is present.  9. The inferior vena cava is dilated in size with >50% respiratory variability, suggesting right atrial pressure of 8 mmHg. Conclusion(s)/Recommendation(s): Continue with dialysis. No indication for pericardiocentesis. Recurrent ascities per chart as well. FINDINGS  Left Ventricle: Left ventricular ejection fraction, by estimation, is 30 to 35%. The left ventricle has moderately decreased function. The left ventricle has no regional wall motion abnormalities. 3D ejection fraction reviewed and evaluated as part of the interpretation. Alternate measurement of EF is felt to be most reflective of LV function. The left ventricular internal cavity size was normal in size. There is moderate left ventricular hypertrophy. Left ventricular diastolic parameters are consistent with Grade I diastolic dysfunction (impaired relaxation). Right Ventricle: The right ventricular size is moderately enlarged. No increase in right ventricular wall thickness. Right ventricular systolic function is normal. There is normal pulmonary artery systolic pressure. The tricuspid regurgitant velocity is 2.10 m/s, and with an assumed right atrial pressure of 15 mmHg, the estimated right ventricular systolic pressure is 32.6 mmHg. Left Atrium: Left atrial size was severely dilated. Right Atrium: Right atrial size was severely dilated. Pericardium: Small to moderate pericardial effusion (posterior predominant) with no tamponade physiology. A moderately sized pericardial effusion is present. The pericardial effusion is circumferential. There is no evidence of cardiac tamponade. Mitral Valve: The mitral valve is normal in structure. There is moderate thickening of the mitral valve leaflet(s). No evidence of  mitral valve regurgitation. No evidence of mitral valve stenosis. Tricuspid Valve: The tricuspid valve is normal in structure. Tricuspid valve regurgitation is moderate . No evidence of tricuspid stenosis. Aortic Valve: The aortic valve is tricuspid. Aortic valve regurgitation is not visualized. No aortic stenosis is present. Pulmonic Valve: The pulmonic valve was normal in structure. Pulmonic valve regurgitation is not visualized. No evidence of pulmonic stenosis. Aorta: The aortic root is normal in size and structure. Venous: The inferior vena cava is dilated in size with greater than 50% respiratory variability, suggesting right atrial pressure of 8 mmHg. IAS/Shunts: No atrial level shunt detected by color flow Doppler.  LEFT VENTRICLE PLAX 2D LVIDd:         5.60 cm      Diastology LVIDs:         4.90 cm      LV e' medial:    7.18 cm/s LV PW:         1.60 cm      LV E/e' medial:  19.9 LV IVS:        1.40 cm      LV e' lateral:   9.68 cm/s LVOT diam:     2.40 cm      LV E/e' lateral: 14.8 LV SV:         100 LV SV Index:   40 LVOT Area:     4.52 cm                              3D Volume EF: LV Volumes (MOD)  3D EF:        19 % LV vol d, MOD A2C: 234.0 ml LV EDV:       384 ml LV vol d, MOD A4C: 204.0 ml LV ESV:       312 ml LV vol s, MOD A2C: 174.0 ml LV SV:        73 ml LV vol s, MOD A4C: 133.0 ml LV SV MOD A2C:     60.0 ml LV SV MOD A4C:     204.0 ml LV SV MOD BP:      68.5 ml RIGHT VENTRICLE RV S prime:     11.40 cm/s TAPSE (M-mode): 2.3 cm LEFT ATRIUM              Index        RIGHT ATRIUM           Index LA diam:        4.60 cm  1.81 cm/m   RA Area:     55.60 cm LA Vol (A2C):   116.0 ml 45.72 ml/m  RA Volume:   331.00 ml 130.47 ml/m LA Vol (A4C):   134.0 ml 52.82 ml/m LA Biplane Vol: 137.0 ml 54.00 ml/m  AORTIC VALVE             PULMONIC VALVE LVOT Vmax:   106.00 cm/s PR End Diast Vel: 5.34 msec LVOT Vmean:  72.200 cm/s LVOT VTI:    0.222 m  AORTA Ao Root diam: 3.60 cm Ao Asc diam:  2.70 cm  MITRAL VALVE                TRICUSPID VALVE MV Area (PHT): 2.84 cm     TR Peak grad:   17.6 mmHg MV Decel Time: 267 msec     TR Vmax:        210.00 cm/s MV E velocity: 143.00 cm/s MV A velocity: 36.00 cm/s   SHUNTS MV E/A ratio:  3.97         Systemic VTI:  0.22 m                             Systemic Diam: 2.40 cm Donato Schultz MD Electronically signed by Donato Schultz MD Signature Date/Time: 04/01/2023/5:06:08 PM    Final    DG Chest Port 1 View  Result Date: 04/01/2023 CLINICAL DATA:  Hypoxia EXAM: PORTABLE CHEST 1 VIEW COMPARISON:  One-view chest x-ray 03/30/2023 at [facility] FINDINGS: The heart is enlarged. Diffuse interstitial edema is increased. Retrocardiac opacification noted. No other focal airspace disease present. IMPRESSION: Cardiomegaly and increasing interstitial edema compatible with congestive heart failure. Electronically Signed   By: Marin Roberts M.D.   On: 04/01/2023 16:29    Pending Labs Unresulted Labs (From admission, onward)     Start     Ordered   04/03/23 0500  Iron and TIBC  Tomorrow morning,   R        04/02/23 0752   04/03/23 0500  Ferritin  Tomorrow morning,   R        04/02/23 0752   04/01/23 2102  Hepatitis B surface antibody,quantitative  (New Admission Hemo Labs (Hepatitis B))  Once,   R        04/01/23 2103   04/01/23 1541  Blood Culture (routine x 2)  (Undifferentiated presentation (screening labs and basic nursing orders))  BLOOD CULTURE X 2,   STAT  04/01/23 1540            Vitals/Pain Today's Vitals   04/02/23 0445 04/02/23 0738 04/02/23 0739 04/02/23 0906  BP:    127/72  Pulse:    (!) 102  Resp:    16  Temp: 98.1 F (36.7 C)     TempSrc: Oral     SpO2:    92%  Weight:      PainSc: 9  10-Worst pain ever 10-Worst pain ever     Isolation Precautions No active isolations  Medications Medications  heparin injection 5,000 Units (5,000 Units Subcutaneous Patient Refused/Not Given 04/02/23 0609)  polyethylene  glycol (MIRALAX / GLYCOLAX) packet 17 g (has no administration in time range)  cyclobenzaprine (FLEXERIL) tablet 10 mg (10 mg Oral Given 04/02/23 0500)  insulin aspart (novoLOG) injection 0-9 Units (100 Units Subcutaneous Not Given 04/02/23 0747)  insulin aspart (novoLOG) injection 0-5 Units (0 Units Subcutaneous Not Given 04/02/23 0440)  Chlorhexidine Gluconate Cloth 2 % PADS 6 each (6 each Topical Not Given 04/02/23 0600)  sertraline (ZOLOFT) tablet 50 mg (50 mg Oral Given 04/02/23 1040)  heparin sodium (porcine) 1000 UNIT/ML injection (has no administration in time range)  acetaminophen (TYLENOL) tablet 650 mg (650 mg Oral Given 04/02/23 0816)  lidocaine (LIDODERM) 5 % 1 patch (1 patch Transdermal Patch Applied 04/02/23 1041)  gabapentin (NEURONTIN) capsule 100 mg (100 mg Oral Given 04/02/23 1040)  Darbepoetin Alfa (ARANESP) injection 150 mcg (has no administration in time range)  acetaminophen (TYLENOL) tablet 1,000 mg (1,000 mg Oral Given 04/01/23 1822)    Mobility walks with person assist     Focused Assessments Cardiac Assessment Handoff:  Cardiac Rhythm: Normal sinus rhythm Lab Results  Component Value Date   CKTOTAL 774 (H) 03/01/2021   No results found for: "DDIMER" Does the Patient currently have chest pain? No    R Recommendations: See Admitting Provider Note  Report given to:   Additional Notes: patient have edema in lower extremities. Lidocaine patch on left hip. Up with assist to get up. Went to HD yesterday took off 5L. Patient is c/o left leg pain.

## 2023-04-02 NOTE — Progress Notes (Signed)
Received patient in bed to unit.  Alert and oriented.  Informed consent signed and in chart. yes  TX duration:3 hours  Patient tolerated well.yes Transported back to the room yes Alert, without acute distress. No complications Hand-off given to patient's nurse. Sharen Hones RN  Access used: yes Access issues: no  Total UF removed: no Medication(s) given: 0 Post HD VS: 117/65 Post HD weight: 134.8kg   [facility]

## 2023-04-02 NOTE — Progress Notes (Signed)
Pt admitted from the ED for CHF exacerbation.  Pt was transported via bed accompanied by a transporter.  Reviewed POC with pt and made him aware of his surroundings.  Obtained VS which WNL.  Answered any pending questions.  Pt had no further questions.  Instructed pt to utilize RN call light for assistance.  Bed locked in lowest position, with two upper side rails engaged.  Belongings and call light within reach.

## 2023-04-02 NOTE — Progress Notes (Signed)
   04/02/23 2228  Vitals  Temp 98.4 F (36.9 C)  Temp Source Oral  BP 117/65  BP Location Right Arm  BP Method Automatic  Patient Position (if appropriate) Lying  Pulse Rate 81  Pulse Rate Source Monitor  Resp 18  Oxygen Therapy  SpO2 99 %  O2 Device Nasal Cannula  During Treatment Monitoring  Intra-Hemodialysis Comments Tx completed;Tolerated well  Post Treatment  Dialyzer Clearance Lightly streaked  Duration of HD Treatment -hour(s) 3 hour(s)  Hemodialysis Intake (mL) 0 mL  Liters Processed 72  Fluid Removed (mL) 5000 mL  Tolerated HD Treatment Yes  Post-Hemodialysis Comments pt stable  AVG/AVF Arterial Site Held (minutes) 10 minutes  AVG/AVF Venous Site Held (minutes) 10 minutes  Fistula / Graft Left Upper arm Arteriovenous fistula  Placement Date/Time: 03/04/21 1327   Placed prior to admission: No  Orientation: Left  Access Location: Upper arm  Access Type: (c) Arteriovenous fistula  Site Condition No complications  Fistula / Graft Assessment Thrill;Bruit  Status Deaccessed  Needle Size 15

## 2023-04-02 NOTE — Progress Notes (Signed)
[facility] Progress Note   Subjective:   feels improved but still long way to go, requesting another UF session today.   Remains on 3L [place]. UF 5.5L with overnight HD.  Objective Vitals:   04/02/23 0336 04/02/23 0439 04/02/23 0445 04/02/23 0906  BP: 116/67 (!) 143/77  127/72  Pulse: 97 96  (!) 102  Resp: (!) 28 15  16   Temp: 98 F (36.7 C)  98.1 F (36.7 C)   TempSrc: Oral  Oral   SpO2: (!) 89% 96%  92%  Weight:       Physical Exam Gen alert, no distress, nasal O2 at 3 L  No rash, cyanosis or gangrene Sclera anicteric, throat clear  No jvd or bruits Chest clear ant RRR no MRG Abd soft ntnd no mass, possibly some ascites GU normal male MS no joint effusions or deformity Ext diffuse 2-3+ bilat pretib edema, also some hip edema Neuro is alert, Ox 3 , nf, no asterixis    LUA AVF+bruit   Additional Objective Labs: Basic Metabolic Panel: Recent Labs  Lab 04/01/23 1601 04/01/23 1632 04/02/23 0502  NA 137 136 136  K 5.7* 5.8* 4.7  CL 93* 96* 93*  CO2 28  --  30  GLUCOSE 119* 119* 98  BUN 87* 86* 57*  CREATININE 11.82* 12.90* 8.68*  CALCIUM 7.3*  --  7.7*  PHOS  --   --  6.5*   Liver Function Tests: Recent Labs  Lab 04/01/23 1601 04/02/23 0502  AST 33  --   ALT 36  --   ALKPHOS 228*  --   BILITOT 1.0  --   PROT 5.7*  --   ALBUMIN <1.5* <1.5*   No results for input(s): "LIPASE", "AMYLASE" in the last 168 hours. CBC: Recent Labs  Lab 04/01/23 1601 04/01/23 1632 04/02/23 0502  WBC 8.4  --  9.4  NEUTROABS 6.7  --   --   HGB 7.9* 7.8* 9.8*  HCT 25.8* 23.0* 31.7*  MCV 88.4  --  85.7  PLT 371  --  457*   Blood Culture No results found for: "SDES", "SPECREQUEST", "CULT", "REPTSTATUS"  Cardiac Enzymes: No results for input(s): "CKTOTAL", "CKMB", "CKMBINDEX", "TROPONINI" in the last 168 hours. CBG: Recent Labs  Lab 04/01/23 2118 04/02/23 0439 04/02/23 0747  GLUCAP 116* 92 92   Iron Studies: No results for input(s): "IRON", "TIBC",  "TRANSFERRIN", "FERRITIN" in the last 72 hours. @lablastinr3 @ Studies/Results: ECHOCARDIOGRAM COMPLETE  Result Date: 04/01/2023    ECHOCARDIOGRAM REPORT   Patient Name:   Bobby [name] Date of Exam: 04/01/2023 Medical Rec #:  [id number]          Height:       77.0 in Accession #:    [id number]         Weight:       269.0 lb Date of Birth:  04-Jul-1996           BSA:          2.537 m Patient Age:    27 years           BP:           120/53 mmHg Patient Gender: M                  HR:           81 bpm. Exam Location:  Inpatient Procedure: 2D Echo, Cardiac Doppler, Color Doppler and 3D Echo STAT ECHO Indications:    pericardial  effusion  History:        Patient has no prior history of Echocardiogram examinations.                 Chronic kidney disease, Signs/Symptoms:Edema; Risk                 Factors:Diabetes and Hypertension.  Sonographer:    Aron Baba Referring Phys: [phone] SCOTT GOLDSTON IMPRESSIONS  1. Left ventricular ejection fraction, by estimation, is 30 to 35%. The left ventricle has moderately decreased function. The left ventricle has no regional wall motion abnormalities. There is moderate left ventricular hypertrophy. Left ventricular diastolic parameters are consistent with Grade I diastolic dysfunction (impaired relaxation).  2. Right ventricular systolic function is normal. The right ventricular size is moderately enlarged. There is normal pulmonary artery systolic pressure.  3. Left atrial size was severely dilated.  4. Right atrial size was severely dilated.  5. Small to moderate pericardial effusion (posterior predominant) with no tamponade physiology. Moderate pericardial effusion. The pericardial effusion is circumferential. There is no evidence of cardiac tamponade.  6. The mitral valve is normal in structure. No evidence of mitral valve regurgitation. No evidence of mitral stenosis.  7. Tricuspid valve regurgitation is moderate.  8. The aortic valve is tricuspid. Aortic valve regurgitation  is not visualized. No aortic stenosis is present.  9. The inferior vena cava is dilated in size with >50% respiratory variability, suggesting right atrial pressure of 8 mmHg. Conclusion(s)/Recommendation(s): Continue with dialysis. No indication for pericardiocentesis. Recurrent ascities per chart as well. FINDINGS  Left Ventricle: Left ventricular ejection fraction, by estimation, is 30 to 35%. The left ventricle has moderately decreased function. The left ventricle has no regional wall motion abnormalities. 3D ejection fraction reviewed and evaluated as part of the interpretation. Alternate measurement of EF is felt to be most reflective of LV function. The left ventricular internal cavity size was normal in size. There is moderate left ventricular hypertrophy. Left ventricular diastolic parameters are consistent with Grade I diastolic dysfunction (impaired relaxation). Right Ventricle: The right ventricular size is moderately enlarged. No increase in right ventricular wall thickness. Right ventricular systolic function is normal. There is normal pulmonary artery systolic pressure. The tricuspid regurgitant velocity is 2.10 m/s, and with an assumed right atrial pressure of 15 mmHg, the estimated right ventricular systolic pressure is 32.6 mmHg. Left [facility]: Left atrial size was severely dilated. Right [facility]: Right atrial size was severely dilated. Pericardium: Small to moderate pericardial effusion (posterior predominant) with no tamponade physiology. A moderately sized pericardial effusion is present. The pericardial effusion is circumferential. There is no evidence of cardiac tamponade. Mitral Valve: The mitral valve is normal in structure. There is moderate thickening of the mitral valve leaflet(s). No evidence of mitral valve regurgitation. No evidence of mitral valve stenosis. Tricuspid Valve: The tricuspid valve is normal in structure. Tricuspid valve regurgitation is moderate . No evidence of tricuspid  stenosis. Aortic Valve: The aortic valve is tricuspid. Aortic valve regurgitation is not visualized. No aortic stenosis is present. Pulmonic Valve: The pulmonic valve was normal in structure. Pulmonic valve regurgitation is not visualized. No evidence of pulmonic stenosis. Aorta: The aortic root is normal in size and structure. Venous: The inferior vena cava is dilated in size with greater than 50% respiratory variability, suggesting right atrial pressure of 8 mmHg. IAS/Shunts: No atrial level shunt detected by color flow Doppler.  LEFT VENTRICLE PLAX 2D LVIDd:         5.60 cm  Diastology LVIDs:         4.90 cm      LV e' medial:    7.18 cm/s LV PW:         1.60 cm      LV E/e' medial:  19.9 LV IVS:        1.40 cm      LV e' lateral:   9.68 cm/s LVOT diam:     2.40 cm      LV E/e' lateral: 14.8 LV SV:         100 LV SV Index:   40 LVOT Area:     4.52 cm                              3D Volume EF: LV Volumes (MOD)            3D EF:        19 % LV vol d, MOD A2C: 234.0 ml LV EDV:       384 ml LV vol d, MOD A4C: 204.0 ml LV ESV:       312 ml LV vol s, MOD A2C: 174.0 ml LV SV:        73 ml LV vol s, MOD A4C: 133.0 ml LV SV MOD A2C:     60.0 ml LV SV MOD A4C:     204.0 ml LV SV MOD BP:      68.5 ml RIGHT VENTRICLE RV S prime:     11.40 cm/s TAPSE (M-mode): 2.3 cm LEFT ATRIUM              Index        RIGHT ATRIUM           Index LA diam:        4.60 cm  1.81 cm/m   RA Area:     55.60 cm LA Vol (A2C):   116.0 ml 45.72 ml/m  RA Volume:   331.00 ml 130.47 ml/m LA Vol (A4C):   134.0 ml 52.82 ml/m LA Biplane Vol: 137.0 ml 54.00 ml/m  AORTIC VALVE             PULMONIC VALVE LVOT Vmax:   106.00 cm/s PR End Diast Vel: 5.34 msec LVOT Vmean:  72.200 cm/s LVOT VTI:    0.222 m  AORTA Ao Root diam: 3.60 cm Ao Asc diam:  2.70 cm MITRAL VALVE                TRICUSPID VALVE MV Area (PHT): 2.84 cm     TR Peak grad:   17.6 mmHg MV Decel Time: 267 msec     TR Vmax:        210.00 cm/s MV E velocity: 143.00 cm/s MV A velocity:  36.00 cm/s   SHUNTS MV E/A ratio:  3.97         Systemic VTI:  0.22 m                             Systemic Diam: 2.40 cm Donato Schultz MD Electronically signed by Donato Schultz MD Signature Date/Time: 04/01/2023/5:06:08 PM    Final    DG Chest Port 1 View  Result Date: 04/01/2023 CLINICAL DATA:  Hypoxia EXAM: PORTABLE CHEST 1 VIEW COMPARISON:  One-view chest x-ray 03/30/2023 at [facility] FINDINGS: The heart is enlarged. Diffuse interstitial edema is  increased. Retrocardiac opacification noted. No other focal airspace disease present. IMPRESSION: Cardiomegaly and increasing interstitial edema compatible with congestive heart failure. Electronically Signed   By: Marin Roberts M.D.   On: 04/01/2023 16:29   Medications:   acetaminophen  650 mg Oral Q6H   Chlorhexidine Gluconate Cloth  6 each Topical Q0600   gabapentin  100 mg Oral TID   heparin  5,000 Units Subcutaneous Q8H   insulin aspart  0-5 Units Subcutaneous QHS   insulin aspart  0-9 Units Subcutaneous TID WC   lidocaine  1 patch Transdermal Daily   sertraline  50 mg Oral Daily    OP HD: [facility]   4h  500/2.0   105.3kg   2/2 bath  L AVF  Heparin 1-K+ 4K midrun - last OP HD on 7/09, post wt 134.5kg - last 2 wks prior to 7/09 was leaving 30kg over dry wt - venofer 50mg  weekly - hectorol 10 mcg IV three times per week - mircera 150 mcg IV q 2 wks, last on 7/02, due 7/16 - sensipar 30 every day at home/ fosrenol 1 gm tid ac   Assessment/ Plan: Volume overload/ pulm edema- massive chronic vol overload, last OP HD at his unit came off 30kg over. Had HD overnight - do another UF session today.   ESRD - on HD TTS. Extra UF today then HD tomorrow - here vs outpt.  Ascites - had 8.5 L LVP at [facility] on 7/25.  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  MBD ckd - CCa in range, phos 6.5  - low phos diet + binders.   Severe nonadherence - poor prognosis; encouraged  Estill Bakes MD 04/02/2023, 11:49 AM  [facility] Pager: [phone]

## 2023-04-02 NOTE — Progress Notes (Addendum)
Pt receives out-pt HD at [facility] on TTS 11:15 am chair time. Will assist as needed.   Olivia Canter Renal Navigator [phone]  Addendum at 2:14 pm: Case discussed with nephrologist. Per nephrologist, pt is appropriate to request inpt 2nd shift HD tomorrow in the event pt can d/c in the morning and receive out-pt HD at out-pt clinic (pt has 2nd shift out-pt appt). Contacted inpt unit to request 2nd shift for tomorrow.

## 2023-04-03 ENCOUNTER — Other Ambulatory Visit (HOSPITAL_COMMUNITY): Payer: Self-pay

## 2023-04-03 DIAGNOSIS — N186 End stage renal disease: Secondary | ICD-10-CM

## 2023-04-03 LAB — GLUCOSE, CAPILLARY
Glucose-Capillary: 112 mg/dL — ABNORMAL HIGH (ref 70–99)
Glucose-Capillary: 108 mg/dL — ABNORMAL HIGH (ref 70–99)
Glucose-Capillary: 122 mg/dL — ABNORMAL HIGH (ref 70–99)

## 2023-04-03 MED ORDER — SACUBITRIL-VALSARTAN 49-51 MG PO TABS
1.0000 | ORAL_TABLET | Freq: Two times a day (BID) | ORAL | 0 refills | Status: AC
  Filled 2023-04-03: qty 60, 30d supply, fill #0

## 2023-04-03 MED ORDER — SACUBITRIL-VALSARTAN 49-51 MG PO TABS
1.0000 | ORAL_TABLET | Freq: Two times a day (BID) | ORAL | Status: DC
  Administered 2023-04-03: 1 via ORAL
  Filled 2023-04-03 (×2): qty 1

## 2023-04-03 MED ORDER — DICLOFENAC SODIUM 1 % EX GEL
4.0000 g | Freq: Four times a day (QID) | CUTANEOUS | 0 refills | Status: DC
  Filled 2023-04-03: qty 100, 8d supply, fill #0

## 2023-04-03 MED ORDER — LIDOCAINE 5 % EX PTCH
1.0000 | MEDICATED_PATCH | Freq: Every day | CUTANEOUS | 0 refills | Status: DC
  Filled 2023-04-03: qty 30, 30d supply, fill #0

## 2023-04-03 MED ORDER — CYCLOBENZAPRINE HCL 10 MG PO TABS
10.0000 mg | ORAL_TABLET | Freq: Two times a day (BID) | ORAL | 0 refills | Status: DC | PRN
  Filled 2023-04-03: qty 20, 10d supply, fill #0

## 2023-04-03 MED ORDER — SACUBITRIL-VALSARTAN 49-51 MG PO TABS: 1.0000 | ORAL_TABLET | Freq: Two times a day (BID) | ORAL | Status: DC

## 2023-04-03 MED ORDER — DARBEPOETIN ALFA 150 MCG/0.3ML IJ SOSY
150.0000 ug | PREFILLED_SYRINGE | INTRAMUSCULAR | 0 refills | Status: DC
Start: 2023-04-09 — End: 2024-04-08
  Filled 2023-04-03: qty 1.2, 28d supply, fill #0

## 2023-04-03 MED ORDER — DARBEPOETIN ALFA 150 MCG/0.3ML IJ SOSY
150.0000 ug | PREFILLED_SYRINGE | Freq: Once | INTRAMUSCULAR | Status: AC
  Administered 2023-04-03: 150 ug via SUBCUTANEOUS
  Filled 2023-04-03: qty 0.3

## 2023-04-03 NOTE — TOC Initial Note (Signed)
Transition of Care [facility]) - Initial/Assessment Note    Patient Details  Name: Bobby [name] MRN: [id number] Date of Birth: 1996/04/02  Transition of Care [facility]) CM/SW Contact:    Lawerance Sabal, RN Phone Number: 04/03/2023, 11:51 AM  Clinical Narrative:                  Sherron Monday w patient at bedside.  Discussed options and insurance barriers for post acute therapies. Patient agreeable to referral to OP PT OT would like [name]. Referral [id number] placed, added to AVS. He declined 3/1.  Patient will return home at DC.   Expected Discharge Plan: Home/Self Care Barriers to Discharge: Continued Medical Work up   Patient Goals and CMS Choice Patient states their goals for this hospitalization and ongoing recovery are:: return home CMS Medicare.gov Compare Post Acute Care list provided to:: Patient Choice offered to / list presented to : Patient      Expected Discharge Plan and Services   Discharge Planning Services: CM Consult Post Acute Care Choice: NA Living arrangements for the past 2 months: Single Family Home                                      Prior Living Arrangements/Services Living arrangements for the past 2 months: Single Family Home                     Activities of Daily Living Home Assistive Devices/Equipment: Environmental consultant (specify type) ADL Screening (condition at time of admission) Patient's cognitive ability adequate to safely complete daily activities?: Yes Is the patient deaf or have difficulty hearing?: No Does the patient have difficulty seeing, even when wearing glasses/contacts?: No Does the patient have difficulty concentrating, remembering, or making decisions?: No Patient able to express need for assistance with ADLs?: Yes Does the patient have difficulty dressing or bathing?: Yes Independently performs ADLs?: No Communication: Independent Dressing (OT): Independent with device (comment) Grooming: Independent with device  (comment) Feeding: Independent Bathing: Independent with device (comment) Toileting: Independent with device (comment) In/Out Bed: Independent with device (comment) Walks in Home: Independent with device (comment) Does the patient have difficulty walking or climbing stairs?: Yes Weakness of Legs: Both Weakness of Arms/Hands: None  Permission Sought/Granted                  Emotional Assessment              Admission diagnosis:  Pericardial effusion [I31.39] Acute respiratory failure with hypoxia (HCC) [J96.01] Acute on chronic HFrEF (heart failure with reduced ejection fraction) (HCC) [I50.23] Acute congestive heart failure, unspecified heart failure type (HCC) [I50.9] Patient Active Problem List   Diagnosis Date Noted   ESRD (end stage renal disease) on dialysis (HCC) 04/03/2023   Acute respiratory failure with hypoxia (HCC) 04/02/2023   ESRD on dialysis (HCC) 04/02/2023   Acute on chronic HFrEF (heart failure with reduced ejection fraction) (HCC) 04/01/2023   Secondary hyperparathyroidism of renal origin (HCC) 03/29/2021   Muscle weakness (generalized) 03/17/2021   Mild protein-calorie malnutrition (HCC) 03/14/2021   Allergy, unspecified, initial encounter 03/08/2021   Anaphylactic shock, unspecified, initial encounter 03/08/2021   Coagulation defect, unspecified (HCC) 03/08/2021   Dependence on renal dialysis (HCC) 03/08/2021   Encounter for immunization 03/08/2021   Iron deficiency anemia, unspecified 03/08/2021   Rhabdomyolysis 02/28/2021   Hypertensive emergency without congestive heart failure 02/28/2021   Uremia 02/28/2021  Prolonged QT interval 02/28/2021   Anemia due to chronic kidney disease 02/28/2021   Nausea & vomiting 02/28/2021   Dehydration 02/28/2021   Diabetes 1.5, 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 Pharmacy:   [facility] [address] [place], [place] - [address] [place] [address] Phone: [phone] Fax: [phone]  [facility] [address] [place] [address] Phone: [phone] Fax: [phone]     Social Determinants of Health (SDOH) Social History: SDOH Screenings   Food Insecurity: No Food Insecurity (04/02/2023)  Housing: Low Risk  (04/02/2023)  Transportation Needs: No Transportation Needs (04/02/2023)  Utilities: Not At Risk (04/02/2023)  Financial Resource Strain: Low Risk  (07/20/2022)   Received from [facility] visits prior to 11/04/2022., [facility], [facility] visits prior to 11/04/2022., [facility]  Physical Activity: Insufficiently Active (07/20/2022)   [facility], [facility] visits prior to 11/04/2022., [facility] visits prior to 11/04/2022., [facility]  Social Connections: Unknown (07/20/2022)   [facility] visits prior to 11/04/2022., [facility] visits prior to 11/04/2022.  Stress: No Stress Concern Present (07/20/2022)   [facility] from [facility], [facility] visits prior to 11/04/2022., [facility] visits prior to 11/04/2022., [facility]  Tobacco Use: Medium Risk (04/02/2023)   SDOH Interventions:     Readmission Risk Interventions    03/11/2021    3:57 PM  Readmission Risk Prevention Plan  Transportation Screening Complete  PCP or Specialist Appt within 5-7 Days  Not Complete  Not Complete comments 03/29/21 is first available appt  Home Care Screening Complete

## 2023-04-03 NOTE — Progress Notes (Addendum)
Case discussed with renal PA and nephrologist. Pt to require inpt HD today. PA requested that pt's clinic be contacted to see if they can treat pt tomorrow on an off day. Contacted clinic and advised that clinic does not have any availability for tomorrow for pt to receive an extra treatment. Update provided to nephrologist and PA. Will assist as needed.   Olivia Canter Renal Navigator [phone]  Addendum at 1:55 pm: Pt to d/c to home today. Contacted [facility] to advise clinic of pt's d/c today and that pt should resume on Thursday. Reminded clinic that nephrologist would like pt to receive an extra treatment tomorrow should an appt become available. Requested that clinic contact pt directly with any openings for tomorrow.

## 2023-04-03 NOTE — TOC Benefit Eligibility Note (Signed)
Pharmacy Patient Advocate Encounter  Insurance verification completed.    The patient is insured through [facility]   Ran test claim for Aranesp. Currently a quantity of 1.71mL is a 30 day supply and the co-pay is $4.00 .   This test claim was processed through [facility]- copay amounts may vary at other pharmacies due to pharmacy/plan contracts, or as the patient moves through the different stages of their insurance plan.

## 2023-04-03 NOTE — Discharge Instructions (Addendum)
Dear Bobby [name],  Thank you for allowing [place] to see you. You were admitted due to shortness of breath and chest pain and was found to have  volume overload. We dialyzed you and removed over 10.5L of fluid. During your hospitalization, you also complained of hip pain for which we gave you some muscle relaxer and gabapentin, which helped with the pain. Here are few things you need to do when you leave the hospital:  1.  Regarding your dialysis appointments, they will call you to see if they have a spot open tomorrow for dialysis.  Other than that you can follow-up with your normal dialysis clinic at [facility].  2.  Regarding your heart failure, continue taking Entresto, but we have lowered your dose.  Please see instructions.  Continue taking your Coreg.  We stopped her Imdur as your blood pressures were on the softer side.  You can resume Imdur once your outpatient doctor allows.  3.You have an appointment with your [facility], please show up to this appointment.  4.  Please follow-up with your primary care physician in 1 week for hospital follow-up.  5.  We have arranged for home health physical therapy and Occupational Therapy.  6.  Regarding your pain, we are discharging with Flexeril, gabapentin, lidocaine patch, Voltaren gel.  Continue following up with this pain outpatient.  Thank you, Dr. Mickie Bail

## 2023-04-03 NOTE — Progress Notes (Signed)
PT Cancellation Note  Patient Details Name: Bobby [name] MRN: [id number] DOB: 1996/05/15   Cancelled Treatment:    Reason Eval/Treat Not Completed: (P) Patient at procedure or test/unavailable (Pt leaving unit for HD at 12:30pm. PTA will continue efforts per PT plan of care as schedule permits.)   Angus Palms 04/03/2023, 12:32 PM

## 2023-04-03 NOTE — Discharge Summary (Addendum)
Name: Bobby [name] MRN: [id number] DOB: 07/31/1996 27 y.o. PCP: [facility]  Date of Admission: 04/01/2023  3:28 PM Date of Discharge: 04/02/2023 Attending Physician: Dr. Mercie Eon  Discharge Diagnosis: Principal Problem:   Acute on chronic HFrEF (heart failure with reduced ejection fraction) (HCC) Active Problems:   Dependence on renal dialysis [facility])   Essential hypertension   Acute respiratory failure with hypoxia (HCC)   ESRD on dialysis (HCC)   ESRD (end stage renal disease) on dialysis [facility])    Discharge Medications: Allergies as of 04/03/2023   No Known Allergies      Medication List     STOP taking these medications    isosorbide mononitrate 60 MG 24 hr tablet Commonly known as: IMDUR   Lokelma 10 g Pack packet Generic drug: sodium zirconium cyclosilicate       TAKE these medications    acetaminophen 325 MG tablet Commonly known as: TYLENOL Take 650 mg by mouth as needed for mild pain or moderate pain.   albuterol 108 (90 Base) MCG/ACT inhaler Commonly known as: VENTOLIN HFA Inhale 2 puffs into the lungs every 6 (six) hours as needed for wheezing.   carvedilol 25 MG tablet Commonly known as: COREG Take 1 tablet (25 mg total) by mouth 2 (two) times daily with a meal.   cinacalcet 30 MG tablet Commonly known as: SENSIPAR Take 30 mg by mouth daily.   cyclobenzaprine 10 MG tablet Commonly known as: FLEXERIL Take 1 tablet (10 mg total) by mouth 2 (two) times daily as needed for muscle spasms.   Darbepoetin Alfa 150 MCG/0.3ML Sosy injection Commonly known as: ARANESP Inject 0.3 mLs (150 mcg total) into the skin every Monday at 6 PM. Start taking on: April 09, 2023   diclofenac Sodium 1 % Gel Commonly known as: VOLTAREN Apply 4 g topically 4 (four) times daily.   gabapentin 100 MG capsule Commonly known as: NEURONTIN Take 100 mg by mouth 3 (three) times daily.   insulin aspart 100 UNIT/ML injection Commonly  known as: novoLOG Inject 5 Units into the skin 3 (three) times daily before meals.   lanthanum 1000 MG chewable tablet Commonly known as: FOSRENOL Chew 1,000 mg by mouth 5 (five) times daily.   lidocaine 5 % Commonly known as: LIDODERM Place 1 patch onto the skin daily. Remove & Discard patch within 12 hours or as directed by MD Start taking on: April 04, 2023   sacubitril-valsartan 49-51 MG Commonly known as: ENTRESTO Take 1 tablet by mouth 2 (two) times daily.   sertraline 50 MG tablet Commonly known as: ZOLOFT Take 50 mg by mouth daily.        Disposition and follow-up:   Mr.Bobby [name] was discharged from [facility] in Stable condition.  At the hospital follow up visit please address:  1.  Follow-up:  a. For HFrEF and volume overload ,his home Entresto dose was reduced to Entresto to 49-51 mg BID on discharge. Coreg was resumed and stopped his Imdur. Please access volume status at follow up and titrate GDMT at tolerated.                     b. For ESRD, dialysis is on Tuesdays, Thursdays and Saturdays. Continue same  schedule  at out-patient dialysis. Had 3 session of Dialysis while inpatient. Patient is Anuric at baseline.     c.  For his left hip pain, he has been discharged with Flexeril, lidocaine patch and  gabapentin 100 mg 3 times daily. Kindly assess the pain and adjust regimen accordingly   c.  For hypertension he is on Coreg 25 mg twice daily. Patient is also on Entresto.  Recheck BMP on follow up   2.  Labs / imaging needed at time of follow-up: BMP  3.  Medication Changes         Reduced dosing of Entresto to 49-51 mg BID         Stopped Imdur  Follow-up Appointments:  Follow-up Information     [facility] Call in 1 week(s).   Why: Call to make an appointment for hospital follow up in one week Contact information: [address] [place] [address] [phone]         [facility] Follow up.   Specialty: Rehabilitation Why: they will call you in about a week to schedule an appointment for therapies. Contact information: [address] [place] [address] [phone]                Hospital Course by problem list:   #Acute hypoxemic respiratory failure #Heart failure with reduced ejection fraction exacerbation # Volume overload Patient presented due to shortness of breath and chest pain after missing dialysis. Patient was very volume overloaded and required non rebreather mask. Patient is anuric at baseline. Nephrology dialzyed patient three times while inpatient. Patient progressed well while inpatient and was weaned down to room air.   Patient is no longer short of breath and leg edema has been significantly improved no signs of pitting at this time.given concern for hypotension, initially GDMT medications were held.  Resumed Coreg as well as Entresto on discharge.  Of note, [name] was resumed on reduced dose.  Did not resume Imdur on discharge.  Can follow-up outpatient to see if patient can resume Imdur outpatient.  #ESRD #Hyperkalemia #Uremia History of end-stage renal disease currently being managed with hemodialysis. He had presented with shortness of breath and chest pain after missing dialysis. Patient was dialyzed during hospital stay.  Nephrology followed.  Had 3 separate dialysis sessions.  Patient is anuric at baseline.  Patient had over 10 L pulled off.  Patient was weaned off oxygen, and progressed well.  Patient to continue dialysis outpatient.  After dialyzing, potassium resolved to normal.   #Left thigh pain During hospital stay, patient had some concerns about left sided hip pain.  Thought to be musculoskeletal in nature.  Patient not have any fractures.  Patient had no trauma.  Gave supportive care with gabapentin, Flexeril, lidocaine patch, Voltaren gel.  Pain improved well during  hospitalization.  Patient discharged with lidocaine patch, Flexeril, Voltaren gel, and gabapentin.  Discharge Subjective: Initially presented with chest pain and shortness of breath also complaining of left thigh pain 2 days ago. Today, patient is satting well on room air has no signs of dyspnea and denies any chest pains at this time.  The left thigh pain has improved significantly rating it 4 out of 10 today.  Discharge Exam:    BP 133/80   Pulse 91   Temp 97.6 F (36.4 C) (Oral)   Resp (!) 24   Wt 135.5 kg   SpO2 96%   BMI 35.42 kg/m   General: Laying comfortably in bed, answering appropriately to questions Cardiovascular: Mild crackles heard on auscultation Pulmonary: Reduced wheezing Psych: Normal mood and affect MSK: No rash, erthyma on left leg. Overlying skin is  intact, no tenderness to palpation on left lower extremity, increased range of motion   Pertinent Labs, Studies, and Procedures:     Latest Ref Rng & Units 04/03/2023   12:48 AM 04/02/2023    5:02 AM 04/01/2023    4:32 PM  CBC  WBC 4.0 - 10.5 K/uL 7.2  9.4    Hemoglobin 13.0 - 17.0 g/dL 9.6  9.8  7.8   Hematocrit 39.0 - 52.0 % 30.9  31.7  23.0   Platelets 150 - 400 K/uL 433  457         Latest Ref Rng & Units 04/03/2023   12:48 AM 04/02/2023    5:02 AM 04/01/2023    4:32 PM  CMP  Glucose 70 - 99 mg/dL 132  98  440   BUN 6 - 20 mg/dL 64  57  86   Creatinine 0.61 - 1.24 mg/dL 1.02  7.25  36.64   Sodium 135 - 145 mmol/L 136  136  136   Potassium 3.5 - 5.1 mmol/L 5.0  4.7  5.8   Chloride 98 - 111 mmol/L 94  93  96   CO2 22 - 32 mmol/L 28  30    Calcium 8.9 - 10.3 mg/dL 7.7  7.7      ECHOCARDIOGRAM COMPLETE  Result Date: 04/01/2023    ECHOCARDIOGRAM REPORT   Patient Name:   NIVEN CAPPUCCIO Date of Exam: 04/01/2023 Medical Rec #:  [id number]          Height:       77.0 in Accession #:    [id number]         Weight:       269.0 lb Date of Birth:  [birth year]           BSA:          2.537 m Patient Age:    27  years           BP:           120/53 mmHg Patient Gender: M                  HR:           81 bpm. Exam Location:  Inpatient Procedure: 2D Echo, Cardiac Doppler, Color Doppler and 3D Echo STAT ECHO Indications:    pericardial effusion  History:        Patient has no prior history of Echocardiogram examinations.                 Chronic kidney disease, Signs/Symptoms:Edema; Risk                 Factors:Diabetes and Hypertension.  Sonographer:    Aron Baba Referring Phys: [phone] SCOTT GOLDSTON IMPRESSIONS  1. Left ventricular ejection fraction, by estimation, is 30 to 35%. The left ventricle has moderately decreased function. The left ventricle has no regional wall motion abnormalities. There is moderate left ventricular hypertrophy. Left ventricular diastolic parameters are consistent with Grade I diastolic dysfunction (impaired relaxation).  2. Right ventricular systolic function is normal. The right ventricular size is moderately enlarged. There is normal pulmonary artery systolic pressure.  3. Left atrial size was severely dilated.  4. Right atrial size was severely dilated.  5. Small to moderate pericardial effusion (posterior predominant) with no tamponade physiology. Moderate pericardial effusion. The pericardial effusion is circumferential. There is no evidence of cardiac tamponade.  6. The mitral valve is normal in structure. No evidence of mitral valve  regurgitation. No evidence of mitral stenosis.  7. Tricuspid valve regurgitation is moderate.  8. The aortic valve is tricuspid. Aortic valve regurgitation is not visualized. No aortic stenosis is present.  9. The inferior vena cava is dilated in size with >50% respiratory variability, suggesting right atrial pressure of 8 mmHg. Conclusion(s)/Recommendation(s): Continue with dialysis. No indication for pericardiocentesis. Recurrent ascities per chart as well. FINDINGS  Left Ventricle: Left ventricular ejection fraction, by estimation, is 30 to 35%. The left  ventricle has moderately decreased function. The left ventricle has no regional wall motion abnormalities. 3D ejection fraction reviewed and evaluated as part of the interpretation. Alternate measurement of EF is felt to be most reflective of LV function. The left ventricular internal cavity size was normal in size. There is moderate left ventricular hypertrophy. Left ventricular diastolic parameters are consistent with Grade I diastolic dysfunction (impaired relaxation). Right Ventricle: The right ventricular size is moderately enlarged. No increase in right ventricular wall thickness. Right ventricular systolic function is normal. There is normal pulmonary artery systolic pressure. The tricuspid regurgitant velocity is 2.10 m/s, and with an assumed right atrial pressure of 15 mmHg, the estimated right ventricular systolic pressure is 32.6 mmHg. Left [facility]: Left atrial size was severely dilated. Right [facility]: Right atrial size was severely dilated. Pericardium: Small to moderate pericardial effusion (posterior predominant) with no tamponade physiology. A moderately sized pericardial effusion is present. The pericardial effusion is circumferential. There is no evidence of cardiac tamponade. Mitral Valve: The mitral valve is normal in structure. There is moderate thickening of the mitral valve leaflet(s). No evidence of mitral valve regurgitation. No evidence of mitral valve stenosis. Tricuspid Valve: The tricuspid valve is normal in structure. Tricuspid valve regurgitation is moderate . No evidence of tricuspid stenosis. Aortic Valve: The aortic valve is tricuspid. Aortic valve regurgitation is not visualized. No aortic stenosis is present. Pulmonic Valve: The pulmonic valve was normal in structure. Pulmonic valve regurgitation is not visualized. No evidence of pulmonic stenosis. Aorta: The aortic root is normal in size and structure. Venous: The inferior vena cava is dilated in size with greater than 50%  respiratory variability, suggesting right atrial pressure of 8 mmHg. IAS/Shunts: No atrial level shunt detected by color flow Doppler.  LEFT VENTRICLE PLAX 2D LVIDd:         5.60 cm      Diastology LVIDs:         4.90 cm      LV e' medial:    7.18 cm/s LV PW:         1.60 cm      LV E/e' medial:  19.9 LV IVS:        1.40 cm      LV e' lateral:   9.68 cm/s LVOT diam:     2.40 cm      LV E/e' lateral: 14.8 LV SV:         100 LV SV Index:   40 LVOT Area:     4.52 cm                              3D Volume EF: LV Volumes (MOD)            3D EF:        19 % LV vol d, MOD A2C: 234.0 ml LV EDV:       384 ml LV vol d, MOD A4C: 204.0 ml LV ESV:  312 ml LV vol s, MOD A2C: 174.0 ml LV SV:        73 ml LV vol s, MOD A4C: 133.0 ml LV SV MOD A2C:     60.0 ml LV SV MOD A4C:     204.0 ml LV SV MOD BP:      68.5 ml RIGHT VENTRICLE RV S prime:     11.40 cm/s TAPSE (M-mode): 2.3 cm LEFT ATRIUM              Index        RIGHT ATRIUM           Index LA diam:        4.60 cm  1.81 cm/m   RA Area:     55.60 cm LA Vol (A2C):   116.0 ml 45.72 ml/m  RA Volume:   331.00 ml 130.47 ml/m LA Vol (A4C):   134.0 ml 52.82 ml/m LA Biplane Vol: 137.0 ml 54.00 ml/m  AORTIC VALVE             PULMONIC VALVE LVOT Vmax:   106.00 cm/s PR End Diast Vel: 5.34 msec LVOT Vmean:  72.200 cm/s LVOT VTI:    0.222 m  AORTA Ao Root diam: 3.60 cm Ao Asc diam:  2.70 cm MITRAL VALVE                TRICUSPID VALVE MV Area (PHT): 2.84 cm     TR Peak grad:   17.6 mmHg MV Decel Time: 267 msec     TR Vmax:        210.00 cm/s MV E velocity: 143.00 cm/s MV A velocity: 36.00 cm/s   SHUNTS MV E/A ratio:  3.97         Systemic VTI:  0.22 m                             Systemic Diam: 2.40 cm Donato Schultz MD Electronically signed by Donato Schultz MD Signature Date/Time: 04/01/2023/5:06:08 PM    Final    DG Chest Port 1 View  Result Date: 04/01/2023 CLINICAL DATA:  Hypoxia EXAM: PORTABLE CHEST 1 VIEW COMPARISON:  One-view chest x-ray 03/30/2023 at [facility] FINDINGS: The heart is enlarged. Diffuse interstitial edema is increased. Retrocardiac opacification noted. No other focal airspace disease present. IMPRESSION: Cardiomegaly and increasing interstitial edema compatible with congestive heart failure. Electronically Signed   By: Marin Roberts M.D.   On: 04/01/2023 16:29     Discharge Instructions: Discharge Instructions     Call MD for:  difficulty breathing, headache or visual disturbances   Complete by: As directed    Call MD for:  extreme fatigue   Complete by: As directed    Call MD for:  hives   Complete by: As directed    Call MD for:  persistant dizziness or light-headedness   Complete by: As directed    Call MD for:  persistant nausea and vomiting   Complete by: As directed    Call MD for:  redness, tenderness, or signs of infection (pain, swelling, redness, odor or green/yellow discharge around incision site)   Complete by: As directed    Call MD for:  severe uncontrolled pain   Complete by: As directed    Call MD for:  temperature >100.4   Complete by: As directed    Diet - low sodium heart healthy   Complete by: As directed  Discharge instructions   Complete by: As directed    Dear Mr Simonetti,   Thank you for allowing [place] to see you. You were admitted due to shortness of breath and chest pain and was found to have  volume overload. We dialyzed you and removed over 10.5L of fluid. During your hospitalization, you also complained of hip pain for which we gave you some muscle relaxer and gabapentin, which helped with the pain. Here are few things you need to do when you leave the hospital:   1.  Regarding your dialysis appointments, they will call you to see if they have a spot open tomorrow for dialysis.  Other than that you can follow-up with your normal dialysis clinic at [facility] kidney center.   2.  Regarding your heart failure, continue taking Entresto, but we have lowered your dose.  Please see  instructions.  Continue taking your Coreg.  We stopped her Imdur as your blood pressures were on the softer side.  You can resume Imdur once your outpatient doctor allows.   3.You have an appointment with your cardiologist on 05/09/2023 at [facility], please show up to this appointment.   4.  Please follow-up with your primary care physician in 1 week for hospital follow-up.   5.  We have arranged for home health physical therapy and Occupational Therapy.   6.  Regarding your pain, we are discharging with Flexeril, gabapentin, lidocaine patch, Voltaren gel.  Continue following up with this pain outpatient.   Thank you, Dr. Mickie Bail   Increase activity slowly   Complete by: As directed       Dear Mr Goetschius,  Thank you for allowing [place] to see you. You were admitted due to shortness of breath and chest pain and was found to have  volume overload. We dialyzed you and removed over 10.5L of fluid. During your hospitalization, you also complained of hip pain for which we gave you some muscle relaxer and gabapentin, which helped with the pain. Here are few things you need to do when you leave the hospital:  1.  Regarding your dialysis appointments, they will call you to see if they have a spot open tomorrow for dialysis.  Other than that you can follow-up with your normal dialysis clinic at [facility] kidney center.  2.  Regarding your heart failure, continue taking Entresto, but we have lowered your dose.  Please see instructions.  Continue taking your Coreg.  We stopped her Imdur as your blood pressures were on the softer side.  You can resume Imdur once your outpatient doctor allows.  3.You have an appointment with your cardiologist on 05/09/2023 at [facility], please show up to this appointment.  4.  Please follow-up with your primary care physician in 1 week for hospital follow-up.  5.  We have arranged for home health physical therapy and Occupational Therapy.  6.  Regarding  your pain, we are discharging with Flexeril, gabapentin, lidocaine patch, Voltaren gel.  Continue following up with this pain outpatient.  Thank you, Dr. Mickie Bail   Signed: Kathleen Lime, MD Internal Medicine Teaching Service Pager [phone]

## 2023-04-03 NOTE — Discharge Planning (Signed)
[facility] Patient Discharge Orders- [facility] CLINIC: HIGH  PT [facility]  Patient's name: Bobby [name] Admit/DC Dates: 04/01/2023 - 04/03/23  Discharge Diagnoses: Volume overload/ pulm edema  = serial HD  and no more sob  , still  needs edema uf / ok for dc   Ascites  -sp 7/25  8.5 l LVP  [facility]   Aranesp: Given: yes    Date and amount of last dose: 04/03/23  Date/# of units: na ESA dose for discharge:  150, mircera  mcg IV q 2 weeks  IV Iron dose at discharge: no  Heparin change: no   EDW Change: no  New EDW:   [place] Change: no   Access intervention/Change: no  Details:  Hectorol/Calcitriol change: no   Discharge Labs: Calcium7.7 Phosphorus 6.5 Albumin  K+ 5.0   IV Antibiotics: no Details:  On Coumadin?: no  Last INR: Next INR: Managed By:   OTHER/APPTS/LAB ORDERS:    D/C Meds to be reconciled by nurse after every discharge.  Completed By:   Reviewed by: MD:______ RN_______

## 2023-04-03 NOTE — Progress Notes (Signed)
   04/03/23 1931  AVS Discharge Documentation  AVS Discharge Instructions Including Medications Provided to patient/caregiver  Name of Person Receiving AVS Discharge Instructions Including Medications Bobby [name]  Name of Clinician That Reviewed AVS Discharge Instructions Including Medications Melina Copa, RN

## 2023-04-03 NOTE — Progress Notes (Addendum)
Subjective:  seen in rm Tolerated  HD yest 5  uf  and today nl schedule hd / realizes needing serial hd  for severe vol overload . No cp / no sob  /  Objective Vital signs in last 24 hours: Vitals:   04/02/23 2228 04/02/23 2245 04/03/23 0109 04/03/23 0430  BP: 117/65  134/73 136/72  Pulse: 81  87 92  Resp: 18  18 18   Temp: 98.4 F (36.9 C)  98.4 F (36.9 C) 98.8 F (37.1 C)  TempSrc: Oral  Oral Oral  SpO2: 99%  94% 98%  Weight:  134.8 kg     Weight change: 17.5 kg  Physical Exam: General: alert , obese  young male nad Heart: RRR, no mrg  Lungs: CTA  , nonlabored   rm o2 sat 98  rm air  Abdomen: NABS , Soft NT, ND , abd wall edema  Extremities: 3+ bilat pretib edema / hip edema  Dialysis Access: LUA  AVF + bruit       OP HD: [facility]   4h  500/2.0   105.3kg   2/2 bath  L AVF  Heparin 1-K+ 4K midrun - last OP HD on 7/09, post wt 134.5kg - last 2 wks prior to 7/09 was leaving 30kg over dry wt - venofer 50mg  weekly - hectorol 10 mcg IV three times per week - mircera 150 mcg IV q 2 wks, last on 7/02, due 7/16 - sensipar 30 every day at home/ fosrenol 1 gm tid ac   Problem/Plan: Volume overload/ pulm edema- massive chronic vol overload, last OP HD at his unit came off 30kg over. Had HD overnight admit and  5 l uf yest , HD  today nl schedule /  need another hd tomor  OP if spot available  ,   will check on it    ESRD - on HD TTS. On schedule  today  .  Ascites - had 8.5 L LVP at [facility] on 7/25.  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  MBD ckd - CCa in range, phos 6.5 - low phos diet + binders.   Severe nonadherence - poor prognosis; encouraged    Lenny Pastel, PA-C [facility] Beeper [phone] 04/03/2023,8:41 AM  LOS: 1 day   Labs: Basic Metabolic Panel: Recent Labs  Lab 04/01/23 1601 04/01/23 1632 04/02/23 0502  04/03/23 0048  NA 137 136 136 136  K 5.7* 5.8* 4.7 5.0  CL 93* 96* 93* 94*  CO2 28  --  30 28  GLUCOSE 119* 119* 98 118*  BUN 87* 86* 57* 64*  CREATININE 11.82* 12.90* 8.68* 9.21*  CALCIUM 7.3*  --  7.7* 7.7*  PHOS  --   --  6.5*  --    Liver Function Tests: Recent Labs  Lab 04/01/23 1601 04/02/23 0502  AST 33  --   ALT 36  --   ALKPHOS 228*  --   BILITOT 1.0  --   PROT 5.7*  --   ALBUMIN <1.5* <1.5*   No results for input(s): "LIPASE", "AMYLASE" in the last 168 hours. No results for input(s): "AMMONIA" in the last 168 hours. CBC: Recent Labs  Lab 04/01/23 1601 04/01/23 1632 04/02/23 0502 04/03/23 0048  WBC 8.4  --  9.4 7.2  NEUTROABS 6.7  --   --   --  HGB 7.9* 7.8* 9.8* 9.6*  HCT 25.8* 23.0* 31.7* 30.9*  MCV 88.4  --  85.7 84.4  PLT 371  --  457* 433*   Cardiac Enzymes: No results for input(s): "CKTOTAL", "CKMB", "CKMBINDEX", "TROPONINI" in the last 168 hours. CBG: Recent Labs  Lab 04/02/23 1230 04/02/23 1433 04/02/23 1651 04/03/23 0110 04/03/23 0732  GLUCAP 105* 111* 120* 112* 108*    Studies/Results: ECHOCARDIOGRAM COMPLETE  Result Date: 04/01/2023    ECHOCARDIOGRAM REPORT   Patient Name:   Bobby [name] Date of Exam: 04/01/2023 Medical Rec #:  [id number]          Height:       77.0 in Accession #:    [id number]         Weight:       269.0 lb Date of Birth:  Aug 26, 1996           BSA:          2.537 m Patient Age:    27 years           BP:           120/53 mmHg Patient Gender: M                  HR:           81 bpm. Exam Location:  Inpatient Procedure: 2D Echo, Cardiac Doppler, Color Doppler and 3D Echo STAT ECHO Indications:    pericardial effusion  History:        Patient has no prior history of Echocardiogram examinations.                 Chronic kidney disease, Signs/Symptoms:Edema; Risk                 Factors:Diabetes and Hypertension.  Sonographer:    Aron Baba Referring Phys: [phone] SCOTT GOLDSTON IMPRESSIONS  1. Left ventricular ejection  fraction, by estimation, is 30 to 35%. The left ventricle has moderately decreased function. The left ventricle has no regional wall motion abnormalities. There is moderate left ventricular hypertrophy. Left ventricular diastolic parameters are consistent with Grade I diastolic dysfunction (impaired relaxation).  2. Right ventricular systolic function is normal. The right ventricular size is moderately enlarged. There is normal pulmonary artery systolic pressure.  3. Left atrial size was severely dilated.  4. Right atrial size was severely dilated.  5. Small to moderate pericardial effusion (posterior predominant) with no tamponade physiology. Moderate pericardial effusion. The pericardial effusion is circumferential. There is no evidence of cardiac tamponade.  6. The mitral valve is normal in structure. No evidence of mitral valve regurgitation. No evidence of mitral stenosis.  7. Tricuspid valve regurgitation is moderate.  8. The aortic valve is tricuspid. Aortic valve regurgitation is not visualized. No aortic stenosis is present.  9. The inferior vena cava is dilated in size with >50% respiratory variability, suggesting right atrial pressure of 8 mmHg. Conclusion(s)/Recommendation(s): Continue with dialysis. No indication for pericardiocentesis. Recurrent ascities per chart as well. FINDINGS  Left Ventricle: Left ventricular ejection fraction, by estimation, is 30 to 35%. The left ventricle has moderately decreased function. The left ventricle has no regional wall motion abnormalities. 3D ejection fraction reviewed and evaluated as part of the interpretation. Alternate measurement of EF is felt to be most reflective of LV function. The left ventricular internal cavity size was normal in size. There is moderate left ventricular hypertrophy. Left ventricular diastolic parameters are consistent with Grade I diastolic dysfunction (impaired relaxation). Right Ventricle:  The right ventricular size is moderately  enlarged. No increase in right ventricular wall thickness. Right ventricular systolic function is normal. There is normal pulmonary artery systolic pressure. The tricuspid regurgitant velocity is 2.10 m/s, and with an assumed right atrial pressure of 15 mmHg, the estimated right ventricular systolic pressure is 32.6 mmHg. Left [facility]: Left atrial size was severely dilated. Right [facility]: Right atrial size was severely dilated. Pericardium: Small to moderate pericardial effusion (posterior predominant) with no tamponade physiology. A moderately sized pericardial effusion is present. The pericardial effusion is circumferential. There is no evidence of cardiac tamponade. Mitral Valve: The mitral valve is normal in structure. There is moderate thickening of the mitral valve leaflet(s). No evidence of mitral valve regurgitation. No evidence of mitral valve stenosis. Tricuspid Valve: The tricuspid valve is normal in structure. Tricuspid valve regurgitation is moderate . No evidence of tricuspid stenosis. Aortic Valve: The aortic valve is tricuspid. Aortic valve regurgitation is not visualized. No aortic stenosis is present. Pulmonic Valve: The pulmonic valve was normal in structure. Pulmonic valve regurgitation is not visualized. No evidence of pulmonic stenosis. Aorta: The aortic root is normal in size and structure. Venous: The inferior vena cava is dilated in size with greater than 50% respiratory variability, suggesting right atrial pressure of 8 mmHg. IAS/Shunts: No atrial level shunt detected by color flow Doppler.  LEFT VENTRICLE PLAX 2D LVIDd:         5.60 cm      Diastology LVIDs:         4.90 cm      LV e' medial:    7.18 cm/s LV PW:         1.60 cm      LV E/e' medial:  19.9 LV IVS:        1.40 cm      LV e' lateral:   9.68 cm/s LVOT diam:     2.40 cm      LV E/e' lateral: 14.8 LV SV:         100 LV SV Index:   40 LVOT Area:     4.52 cm                              3D Volume EF: LV Volumes (MOD)            3D  EF:        19 % LV vol d, MOD A2C: 234.0 ml LV EDV:       384 ml LV vol d, MOD A4C: 204.0 ml LV ESV:       312 ml LV vol s, MOD A2C: 174.0 ml LV SV:        73 ml LV vol s, MOD A4C: 133.0 ml LV SV MOD A2C:     60.0 ml LV SV MOD A4C:     204.0 ml LV SV MOD BP:      68.5 ml RIGHT VENTRICLE RV S prime:     11.40 cm/s TAPSE (M-mode): 2.3 cm LEFT ATRIUM              Index        RIGHT ATRIUM           Index LA diam:        4.60 cm  1.81 cm/m   RA Area:     55.60 cm LA Vol (A2C):   116.0 ml 45.72 ml/m  RA Volume:  331.00 ml 130.47 ml/m LA Vol (A4C):   134.0 ml 52.82 ml/m LA Biplane Vol: 137.0 ml 54.00 ml/m  AORTIC VALVE             PULMONIC VALVE LVOT Vmax:   106.00 cm/s PR End Diast Vel: 5.34 msec LVOT Vmean:  72.200 cm/s LVOT VTI:    0.222 m  AORTA Ao Root diam: 3.60 cm Ao Asc diam:  2.70 cm MITRAL VALVE                TRICUSPID VALVE MV Area (PHT): 2.84 cm     TR Peak grad:   17.6 mmHg MV Decel Time: 267 msec     TR Vmax:        210.00 cm/s MV E velocity: 143.00 cm/s MV A velocity: 36.00 cm/s   SHUNTS MV E/A ratio:  3.97         Systemic VTI:  0.22 m                             Systemic Diam: 2.40 cm Donato Schultz MD Electronically signed by Donato Schultz MD Signature Date/Time: 04/01/2023/5:06:08 PM    Final    DG Chest Port 1 View  Result Date: 04/01/2023 CLINICAL DATA:  Hypoxia EXAM: PORTABLE CHEST 1 VIEW COMPARISON:  One-view chest x-ray 03/30/2023 at [facility] FINDINGS: The heart is enlarged. Diffuse interstitial edema is increased. Retrocardiac opacification noted. No other focal airspace disease present. IMPRESSION: Cardiomegaly and increasing interstitial edema compatible with congestive heart failure. Electronically Signed   By: Marin Roberts M.D.   On: 04/01/2023 16:29   Medications:   acetaminophen  650 mg Oral Q6H   carvedilol  25 mg Oral BID WC   Chlorhexidine Gluconate Cloth  6 each Topical Q0600   darbepoetin (ARANESP) injection - DIALYSIS  150 mcg Subcutaneous Q  Mon-1800   diclofenac Sodium  4 g Topical QID   gabapentin  100 mg Oral TID   heparin  5,000 Units Subcutaneous Q8H   insulin aspart  0-5 Units Subcutaneous QHS   insulin aspart  0-9 Units Subcutaneous TID WC   lidocaine  1 patch Transdermal Daily   sacubitril-valsartan  1 tablet Oral BID   sertraline  50 mg Oral Daily

## 2023-04-03 NOTE — Progress Notes (Signed)
   Subjective: 27 year old male, hx of ESRD on HD Tu/Th/Sat ,Type 1 diabetes ,HTN presented to the ED due to concerns for SOB and chest pain and found to have volume overload likely in the setting of missing dialysis on Saturday.  Overnight Events: Patient underwent hemodialysis and 5.0 L of fluid removed.  Tolerated dialysis well and did not have any complaints.  Objective:  Vital signs in last 24 hours: Vitals:   04/02/23 2228 04/02/23 2245 04/03/23 0109 04/03/23 0430  BP: 117/65  134/73 136/72  Pulse: 81  87 92  Resp: 18  18 18   Temp: 98.4 F (36.9 C)  98.4 F (36.9 C) 98.8 F (37.1 C)  TempSrc: Oral  Oral Oral  SpO2: 99%  94% 98%  Weight:  134.8 kg     PHYSICAL EXAM : General: Laying comfortably in bed, answering appropriately to questions Cardiovascular: Mild crackles heard on auscultation Pulmonary: Reduced wheezing Psych: Normal mood and affect  Assessment/Plan:  Principal Problem:   Acute on chronic HFrEF (heart failure with reduced ejection fraction) (HCC) Active Problems:   Dependence on renal dialysis (HCC)   Essential hypertension   Acute respiratory failure with hypoxia (HCC)   ESRD on dialysis (HCC)  #Acute hypoxemic respiratory failure #Heart failure with reduced ejection fraction exacerbation # Volume overload Patient presented due to shortness of breath and chest pain after missing dialysis, got dialyzed  by nephrology and  took 5.5 L out 7/29.  He also got another dialysis yesterday 7/30 and got another 5L fluids removed. Patient weighs 134.8kg from 139.5kg today. Patient remains on 3L [place] sating at 98% - HD today - Monitor daily weights - Continue Coreg - Resume GDMT as tolerated    #ESRD #Hyperkalemia #Uremia History of end-stage renal disease currently being managed with hemodialysis on Tuesdays Thursdays and Saturdays.  Was admitted and currently being managed for fluid overload, Nephrology is following and consults appreciated.  Hyperkalemia  resolved. -Continue on hemodialysis Tuesdays ,Thursdays and Saturdays -Follow-up on nephrology recs - Avoid nephrotoxic agents     #Left thigh pain And complaint of new onset of left thigh pain 2 days ago for which she rated the pain as 10 out of 10, patient on Flexeril and lidocaine patch and increase gabapentin to 100 mg 3 times daily, today patient endorses significant improvement of the pain rates the pain 4 out of 10 states he is feeling very well overall.  No changes in pain regimen is recommended at this time - Continue Flexeril  - Continue Lidocaine Patch - Continue Gabapentin 100 mg TID   Prior to Admission Living Arrangement: Home in [facility]  Anticipated Discharge Location: Home in [place]  Barriers to Discharge:Medical management for clinical improvement  Dispo: Anticipated discharge in approximately 1-2 day(s).   Kathleen Lime, MD 04/03/2023, 5:09 AM Pager: @MYPAGER @ After 5pm on weekdays and 1pm on weekends: On Call pager [phone]

## 2023-04-04 ENCOUNTER — Telehealth (HOSPITAL_COMMUNITY): Payer: Self-pay | Admitting: Pharmacy Technician

## 2023-04-04 NOTE — Telephone Encounter (Signed)
Pharmacy Patient Advocate Encounter  Received notification from [facility] that Prior Authorization for Aranesp (Albumin Free) 150MCG/0.3ML syringes has been APPROVED from 04/03/2023 to 09/29/2023. Ran test claim, Copay is $4.00  PA #/Case ID/Reference #: [id number] Key: [id number]

## 2023-04-04 NOTE — Telephone Encounter (Signed)
Pharmacy Patient Advocate Encounter  Received notification from [facility] that Prior Authorization for Lidoderm 5% patches has been DENIED. Please advise how you'd like to proceed. Full denial letter will be uploaded to the media tab. See denial reason below.  : because we did not see what we  need to approve the drug you asked for, (Dermacinrx Lidocan). This request tells [place] your  doctor asked for this drug for your illness (left hip pain). We may be able to approve this drug  for certain illnesses (post-herpetic neuralgia, neuropathic pain, and chronic musculo-skeletal  pain [greater than 6 months in duration]). If you do have one of these illnesses, we may need  more information. We based this decision on your health plan's prior authorization  clinical criteria named Topical Local Anesthetics.   PA #/Case ID/Reference #: [id number] Key: [id number]

## 2023-04-05 ENCOUNTER — Telehealth: Payer: Self-pay | Admitting: Nephrology

## 2023-04-05 NOTE — Telephone Encounter (Signed)
Transition of care contact from inpatient facility  Date of Discharge: 04/03/23 Date of Contact: attempted  x 2  Method of contact: Phone  Attempted to contact patient to discuss transition of care from inpatient admission. Patient did not answer the phone.  Mail box was full  will have follow up at  [place] [facility]  TTS schedule .

## 2023-04-17 NOTE — Therapy (Deleted)
OUTPATIENT PHYSICAL THERAPY LOWER EXTREMITY EVALUATION   Patient Name: Bobby [name] MRN: [id number] DOB:[birth year], 27 y.o., male Today's Date: 04/17/2023  END OF SESSION:   Past Medical History:  Diagnosis Date   Diabetes mellitus without complication (HCC)    Type I   ESRD (end stage renal disease) (HCC)    hemodialysis initiated 02/28/21   Hypertension    Past Surgical History:  Procedure Laterality Date   AV FISTULA PLACEMENT Left 03/04/2021   Procedure: Basilic Vein Transposition Left upper arm fistula;  Surgeon: Larina Earthly, MD;  Location: [facility] OR;  Service: Vascular;  Laterality: Left;  PERIPHERAL NERVE BLOCK   IR PERC TUN PERIT CATH WO PORT S&I /IMAG  02/28/2021   IR US GUIDE VASC ACCESS RIGHT  02/28/2021   Patient Active Problem List   Diagnosis Date Noted   ESRD (end stage renal disease) on dialysis (HCC) 04/03/2023   Acute respiratory failure with hypoxia (HCC) 04/02/2023   ESRD on dialysis (HCC) 04/02/2023   Acute on chronic HFrEF (heart failure with reduced ejection fraction) (HCC) 04/01/2023   Secondary hyperparathyroidism of renal origin (HCC) 03/29/2021   Muscle weakness (generalized) 03/17/2021   Mild protein-calorie malnutrition (HCC) 03/14/2021   Allergy, unspecified, initial encounter 03/08/2021   Anaphylactic shock, unspecified, initial encounter 03/08/2021   Coagulation defect, unspecified (HCC) 03/08/2021   Dependence on renal dialysis (HCC) 03/08/2021   Encounter for immunization 03/08/2021   Iron deficiency anemia, unspecified 03/08/2021   Rhabdomyolysis 02/28/2021   Hypertensive emergency without congestive heart failure 02/28/2021   Uremia 02/28/2021   Prolonged QT interval 02/28/2021   Anemia due to chronic kidney disease 02/28/2021   Nausea & vomiting 02/28/2021   Dehydration 02/28/2021   Diabetes 1.5, 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   Hypertensive urgency 02/28/2021   AKI (acute kidney injury) (HCC) 02/28/2021   Acute renal failure  superimposed on chronic kidney disease (HCC) 02/27/2021   Leg swelling 12/01/2019   Persistent proteinuria 12/01/2019   Essential hypertension 06/24/2019   Uninsured 06/24/2019   Hx of noncompliance with medical treatment, presenting hazards to health 06/06/2019   Chronic kidney disease, stage 3 (HCC) 06/05/2019    PCP: [facility]  REFERRING PROVIDER: Mercie Eon, MD   REFERRING DIAG: R53.1 (ICD-10-CM) - Weakness   THERAPY DIAG:  No diagnosis found.  Rationale for Evaluation and Treatment: Rehabilitation  ONSET DATE: 04/03/23  SUBJECTIVE:   SUBJECTIVE STATEMENT: ***  PERTINENT HISTORY: Per ER D/C note: 04/03/23 27yo man with ESRD on T/TH/S HD and HFrEF who presented with dyspnea in the setting of chronic massive volume overload. We consulted Nephrology - they did dialysis with UF on 7/28, 7/29, and 7/30 with marked improvement in his symptoms.  #Left thigh pain During hospital stay, patient had some concerns about left sided hip pain.  Thought to be musculoskeletal in nature.  Patient not have any fractures.  Patient had no trauma.  Gave supportive care with gabapentin, Flexeril, lidocaine patch, Voltaren gel.  Pain improved well during hospitalization.  Patient discharged with lidocaine patch, Flexeril, Voltaren gel, and gabapentin. PAIN:  Are you having pain? {OPRCPAIN:27236}  PRECAUTIONS: Other: CHF, kidney disease , Dialysis T/Th/Sat  RED FLAGS: {PT Red Flags:29287}   WEIGHT BEARING RESTRICTIONS: No  FALLS:  Has patient fallen in last 6 months? {fallsyesno:27318}  LIVING ENVIRONMENT: Lives with: {OPRC lives with:25569::"lives with their family"} Lives in: {Lives in:25570} Stairs: {opstairs:27293} Has following equipment at home: {Assistive devices:23999}  OCCUPATION: ***  PLOF: {PLOF:24004}  PATIENT GOALS: ***  NEXT MD VISIT: ***  OBJECTIVE:   DIAGNOSTIC FINDINGS:  Port chest Xray 04/01/23 IMPRESSION: Cardiomegaly and increasing  interstitial edema compatible with congestive heart failure.   PATIENT SURVEYS:  {rehab surveys:24030}  COGNITION: Overall cognitive status: {cognition:24006}     SENSATION: {sensation:27233}  EDEMA:  {edema:24020}  MUSCLE LENGTH: Hamstrings: Right *** deg; Left *** deg Maisie Fus test: Right *** deg; Left *** deg  POSTURE: {posture:25561}  PALPATION: ***  LOWER EXTREMITY ROM:  {AROM/PROM:27142} ROM Right eval Left eval  Hip flexion    Hip extension    Hip abduction    Hip adduction    Hip internal rotation    Hip external rotation    Knee flexion    Knee extension    Ankle dorsiflexion    Ankle plantarflexion    Ankle inversion    Ankle eversion     (Blank rows = not tested)  LOWER EXTREMITY MMT:  MMT Right eval Left eval  Hip flexion    Hip extension    Hip abduction    Hip adduction    Hip internal rotation    Hip external rotation    Knee flexion    Knee extension    Ankle dorsiflexion    Ankle plantarflexion    Ankle inversion    Ankle eversion     (Blank rows = not tested)  LOWER EXTREMITY SPECIAL TESTS:  {LEspecialtests:26242}  FUNCTIONAL TESTS:  {Functional tests:24029}  GAIT: Distance walked: *** Assistive device utilized: {Assistive devices:23999} Level of assistance: {Levels of assistance:24026} Comments: ***   TODAY'S TREATMENT:                                                                                                                              DATE:  04/18/23 Education  PATIENT EDUCATION:  Education details: POC Person educated: Patient Education method: Explanation Education comprehension: verbalized understanding  HOME EXERCISE PROGRAM: ***  ASSESSMENT:  CLINICAL IMPRESSION: Patient is a 27 y.o. who was seen today for physical therapy evaluation and treatment for ***.   OBJECTIVE IMPAIRMENTS: decreased activity tolerance, decreased balance, decreased coordination, decreased endurance, decreased mobility,  difficulty walking, decreased strength, impaired UE functional use, postural dysfunction, and pain.   ACTIVITY LIMITATIONS: standing, squatting, stairs, transfers, bed mobility, and locomotion level  PARTICIPATION LIMITATIONS: meal prep, cleaning, laundry, driving, shopping, and community activity  PERSONAL FACTORS: Past/current experiences and 1-2 comorbidities: CHF, chronic kidney disease with Dialysis T, Th, Sat  are also affecting patient's functional outcome.   REHAB POTENTIAL: Good  CLINICAL DECISION MAKING: Evolving/moderate complexity  EVALUATION COMPLEXITY: Moderate   GOALS: Goals reviewed with patient? Yes  SHORT TERM GOALS: Target date: 05/05/23 I with initial HEP Baseline: Goal status: INITIAL  2.  *** Baseline:  Goal status: INITIAL  3.  *** Baseline:  Goal status: INITIAL  4.  *** Baseline:  Goal status: INITIAL  5.  *** Baseline:  Goal status: INITIAL  6.  *** Baseline:  Goal status:  INITIAL  LONG TERM GOALS: Target date: ***  I with final HEP Baseline:  Goal status: INITIAL  2.  Complete 5 x STS in < 12 sec Baseline:  Goal status: INITIAL  3.  Complete at least 800' I during 6 min walk test. Baseline:  Goal status: INITIAL  4.  Increase BLE strength to at least 4/5 Baseline:  Goal status: INITIAL  5.  *** Baseline:  Goal status: INITIAL  6.  *** Baseline:  Goal status: INITIAL  PLAN:  PT FREQUENCY: 1-2x/week  PT DURATION: 10 weeks  PLANNED INTERVENTIONS: Therapeutic exercises, Therapeutic activity, Neuromuscular re-education, Balance training, Gait training, Patient/Family education, Self Care, Joint mobilization, Stair training, Electrical stimulation, Cryotherapy, Moist heat, and Manual therapy  PLAN FOR NEXT SESSION: HEP   Iona Beard, DPT 04/17/2023, 11:11 AM

## 2023-04-17 NOTE — Therapy (Deleted)
OUTPATIENT OCCUPATIONAL THERAPY NEURO EVALUATION  Patient Name: Bobby [name] MRN: [id number] DOB:[birth year], 27 y.o., male Today's Date: 04/17/2023  PCP: [facility] REFERRING PROVIDER: Dr. Lafonda Mosses  END OF SESSION:   Past Medical History:  Diagnosis Date   Diabetes mellitus without complication (HCC)    Type I   ESRD (end stage renal disease) (HCC)    hemodialysis initiated 02/28/21   Hypertension    Past Surgical History:  Procedure Laterality Date   AV FISTULA PLACEMENT Left 03/04/2021   Procedure: Basilic Vein Transposition Left upper arm fistula;  Surgeon: Larina Earthly, MD;  Location: [facility] OR;  Service: Vascular;  Laterality: Left;  PERIPHERAL NERVE BLOCK   IR PERC TUN PERIT CATH WO PORT S&I /IMAG  02/28/2021   IR US GUIDE VASC ACCESS RIGHT  02/28/2021   Patient Active Problem List   Diagnosis Date Noted   ESRD (end stage renal disease) on dialysis (HCC) 04/03/2023   Acute respiratory failure with hypoxia (HCC) 04/02/2023   ESRD on dialysis (HCC) 04/02/2023   Acute on chronic HFrEF (heart failure with reduced ejection fraction) (HCC) 04/01/2023   Secondary hyperparathyroidism of renal origin (HCC) 03/29/2021   Muscle weakness (generalized) 03/17/2021   Mild protein-calorie malnutrition (HCC) 03/14/2021   Allergy, unspecified, initial encounter 03/08/2021   Anaphylactic shock, unspecified, initial encounter 03/08/2021   Coagulation defect, unspecified (HCC) 03/08/2021   Dependence on renal dialysis (HCC) 03/08/2021   Encounter for immunization 03/08/2021   Iron deficiency anemia, unspecified 03/08/2021   Rhabdomyolysis 02/28/2021   Hypertensive emergency without congestive heart failure 02/28/2021   Uremia 02/28/2021   Prolonged QT interval 02/28/2021   Anemia due to chronic kidney disease 02/28/2021   Nausea & vomiting 02/28/2021   Dehydration 02/28/2021   Diabetes 1.5, 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   Hypertensive urgency 02/28/2021   AKI  (acute kidney injury) (HCC) 02/28/2021   Acute renal failure superimposed on chronic kidney disease (HCC) 02/27/2021   Leg swelling 12/01/2019   Persistent proteinuria 12/01/2019   Essential hypertension 06/24/2019   Uninsured 06/24/2019   Hx of noncompliance with medical treatment, presenting hazards to health 06/06/2019   Chronic kidney disease, stage 3 (HCC) 06/05/2019    ONSET DATE: 04/03/23  REFERRING DIAG:  Diagnosis  R53.1 (ICD-10-CM) - Weakness    THERAPY DIAG:  No diagnosis found.  Rationale for Evaluation and Treatment: {HABREHAB:27488}  SUBJECTIVE:   SUBJECTIVE STATEMENT: *** Pt accompanied by: {accompnied:27141}  PERTINENT HISTORY: The pt is a 27 yo male hospitalize 7/28 with hypoxia and CHF exacerbation. Pt d/c from [facility] 7/26 and missed HD session between d/c and admission due to feeling ill. PMH includes: ESRD on HD, HFrEF, type 1 diabetes, HTN, and obesity.  PRECAUTIONS: {Therapy precautions:24002}  WEIGHT BEARING RESTRICTIONS: {Yes ***/No:24003}  PAIN:  Are you having pain? {OPRCPAIN:27236}  FALLS: Has patient fallen in last 6 months? {fallsyesno:27318}  LIVING ENVIRONMENT: Lives with: {OPRC lives with:25569::"lives with their family"} Lives in: {Lives in:25570} Stairs: {opstairs:27293} Has following equipment at home: {Assistive devices:23999}  PLOF: {PLOF:24004}  PATIENT GOALS: ***  OBJECTIVE:   HAND DOMINANCE: {MISC; OT HAND [phone]}  ADLs: Overall ADLs: *** Transfers/ambulation related to ADLs: Eating: *** Grooming: *** UB Dressing: *** LB Dressing: *** Toileting: *** Bathing: *** Tub Shower transfers: *** Equipment: {equipment:25573}  IADLs: Shopping: *** Light housekeeping: *** Meal Prep: *** Community mobility: *** Medication management: *** Financial management: *** Handwriting: {OTWRITTENEXPRESSION:25361}  MOBILITY STATUS: {OTMOBILITY:25360}  POSTURE COMMENTS:  {posture:25561} Sitting  balance: {sitting balance:25483}  ACTIVITY TOLERANCE: Activity tolerance: ***  FUNCTIONAL OUTCOME MEASURES: {OTFUNCTIONALMEASURES:27238}  UPPER EXTREMITY ROM:    {AROM/PROM:27142} ROM Right eval Left eval  Shoulder flexion    Shoulder abduction    Shoulder adduction    Shoulder extension    Shoulder internal rotation    Shoulder external rotation    Elbow flexion    Elbow extension    Wrist flexion    Wrist extension    Wrist ulnar deviation    Wrist radial deviation    Wrist pronation    Wrist supination    (Blank rows = not tested)  UPPER EXTREMITY MMT:     MMT Right eval Left eval  Shoulder flexion    Shoulder abduction    Shoulder adduction    Shoulder extension    Shoulder internal rotation    Shoulder external rotation    Middle trapezius    Lower trapezius    Elbow flexion    Elbow extension    Wrist flexion    Wrist extension    Wrist ulnar deviation    Wrist radial deviation    Wrist pronation    Wrist supination    (Blank rows = not tested)  HAND FUNCTION: {handfunction:27230}  COORDINATION: {otcoordination:27237}  SENSATION: {sensation:27233}  EDEMA: ***  MUSCLE TONE: {UETONE:25567}  COGNITION: Overall cognitive status: {cognition:24006}  VISION: Subjective report: *** Baseline vision: {OTBASELINEVISION:25363} Visual history: {OTVISUALHISTORY:25364}  VISION ASSESSMENT: {visionassessment:27231}  Patient has difficulty with following activities due to following visual impairments: ***  PERCEPTION: {Perception:25564}  PRAXIS: {Praxis:25565}  OBSERVATIONS: ***   TODAY'S TREATMENT:                                                                                                                              DATE: ***   PATIENT EDUCATION: Education details: *** Person educated: {Person educated:25204} Education method: {Education Method:25205} Education comprehension: {Education Comprehension:25206}  HOME EXERCISE  PROGRAM: ***   GOALS: Goals reviewed with patient? {yes/no:20286}  SHORT TERM GOALS: Target date: ***  *** Baseline: Goal status: INITIAL  2.  *** Baseline:  Goal status: INITIAL  3.  *** Baseline:  Goal status: INITIAL  4.  *** Baseline:  Goal status: INITIAL  5.  *** Baseline:  Goal status: INITIAL  6.  *** Baseline:  Goal status: INITIAL  LONG TERM GOALS: Target date: ***  *** Baseline:  Goal status: INITIAL  2.  *** Baseline:  Goal status: INITIAL  3.  *** Baseline:  Goal status: INITIAL  4.  *** Baseline:  Goal status: INITIAL  5.  *** Baseline:  Goal status: INITIAL  6.  *** Baseline:  Goal status: INITIAL  ASSESSMENT:  CLINICAL IMPRESSION: Patient is a 27 y.o. male who was seen today for occupational therapy evaluation for R53.1 (ICD-10-CM) - Weakness.   PERFORMANCE DEFICITS: in functional skills including {OT physical skills:25468}, cognitive skills including {OT cognitive skills:25469}, and psychosocial skills including {OT psychosocial skills:25470}.   IMPAIRMENTS: are limiting  patient from {OT performance deficits:25471}.   CO-MORBIDITIES: {Comorbidities:25485} that affects occupational performance. Patient will benefit from skilled OT to address above impairments and improve overall function.  MODIFICATION OR ASSISTANCE TO COMPLETE EVALUATION: {OT modification:25474}  OT OCCUPATIONAL PROFILE AND HISTORY: {OT PROFILE AND HISTORY:25484}  CLINICAL DECISION MAKING: {OT CDM:25475}  REHAB POTENTIAL: {rehabpotential:25112}  EVALUATION COMPLEXITY: {Evaluation complexity:25115}    PLAN:  OT FREQUENCY: {rehab frequency:25116}  OT DURATION: {rehab duration:25117}  PLANNED INTERVENTIONS: {OT Interventions:25467}  RECOMMENDED OTHER SERVICES: ***  CONSULTED AND AGREED WITH PLAN OF CARE: {ZOX:09604}  PLAN FOR NEXT SESSION: ***   Keene Breath, OT 04/17/2023, 9:07 AM

## 2023-04-18 ENCOUNTER — Inpatient Hospital Stay (HOSPITAL_COMMUNITY)
Admission: EM | Admit: 2023-04-18 | Discharge: 2023-04-25 | DRG: 291 | Disposition: A | Discharge status: Home / Self Care | Payer: Medicaid Other | Attending: Internal Medicine | Admitting: Internal Medicine

## 2023-04-18 ENCOUNTER — Other Ambulatory Visit: Payer: Self-pay

## 2023-04-18 ENCOUNTER — Encounter (HOSPITAL_COMMUNITY): Payer: Self-pay

## 2023-04-18 ENCOUNTER — Emergency Department (HOSPITAL_COMMUNITY): Payer: Medicaid Other

## 2023-04-18 ENCOUNTER — Ambulatory Visit: Payer: Medicaid Other | Admitting: Occupational Therapy

## 2023-04-18 ENCOUNTER — Ambulatory Visit: Payer: Medicaid Other | Attending: Internal Medicine | Admitting: Physical Therapy

## 2023-04-18 DIAGNOSIS — I3139 Other pericardial effusion (noninflammatory): Secondary | ICD-10-CM | POA: Diagnosis present

## 2023-04-18 DIAGNOSIS — R601 Generalized edema: Secondary | ICD-10-CM | POA: Insufficient documentation

## 2023-04-18 DIAGNOSIS — K7460 Unspecified cirrhosis of liver: Secondary | ICD-10-CM | POA: Diagnosis present

## 2023-04-18 DIAGNOSIS — Z79899 Other long term (current) drug therapy: Secondary | ICD-10-CM

## 2023-04-18 DIAGNOSIS — H5462 Unqualified visual loss, left eye, normal vision right eye: Secondary | ICD-10-CM | POA: Diagnosis present

## 2023-04-18 DIAGNOSIS — Z1152 Encounter for screening for COVID-19: Secondary | ICD-10-CM

## 2023-04-18 DIAGNOSIS — F1290 Cannabis use, unspecified, uncomplicated: Secondary | ICD-10-CM | POA: Diagnosis present

## 2023-04-18 DIAGNOSIS — F329 Major depressive disorder, single episode, unspecified: Secondary | ICD-10-CM | POA: Diagnosis present

## 2023-04-18 DIAGNOSIS — N186 End stage renal disease: Secondary | ICD-10-CM | POA: Diagnosis present

## 2023-04-18 DIAGNOSIS — Z8249 Family history of ischemic heart disease and other diseases of the circulatory system: Secondary | ICD-10-CM

## 2023-04-18 DIAGNOSIS — D631 Anemia in chronic kidney disease: Secondary | ICD-10-CM | POA: Diagnosis present

## 2023-04-18 DIAGNOSIS — M549 Dorsalgia, unspecified: Secondary | ICD-10-CM | POA: Diagnosis present

## 2023-04-18 DIAGNOSIS — Z794 Long term (current) use of insulin: Secondary | ICD-10-CM

## 2023-04-18 DIAGNOSIS — E872 Acidosis, unspecified: Secondary | ICD-10-CM | POA: Diagnosis present

## 2023-04-18 DIAGNOSIS — I132 Hypertensive heart and chronic kidney disease with heart failure and with stage 5 chronic kidney disease, or end stage renal disease: Principal | ICD-10-CM | POA: Diagnosis present

## 2023-04-18 DIAGNOSIS — W19XXXA Unspecified fall, initial encounter: Secondary | ICD-10-CM

## 2023-04-18 DIAGNOSIS — E8809 Other disorders of plasma-protein metabolism, not elsewhere classified: Secondary | ICD-10-CM | POA: Diagnosis present

## 2023-04-18 DIAGNOSIS — N189 Chronic kidney disease, unspecified: Secondary | ICD-10-CM | POA: Diagnosis present

## 2023-04-18 DIAGNOSIS — Z87891 Personal history of nicotine dependence: Secondary | ICD-10-CM

## 2023-04-18 DIAGNOSIS — H538 Other visual disturbances: Secondary | ICD-10-CM

## 2023-04-18 DIAGNOSIS — N2581 Secondary hyperparathyroidism of renal origin: Secondary | ICD-10-CM | POA: Diagnosis present

## 2023-04-18 DIAGNOSIS — Z992 Dependence on renal dialysis: Secondary | ICD-10-CM

## 2023-04-18 DIAGNOSIS — R188 Other ascites: Secondary | ICD-10-CM | POA: Diagnosis present

## 2023-04-18 DIAGNOSIS — M7989 Other specified soft tissue disorders: Secondary | ICD-10-CM

## 2023-04-18 DIAGNOSIS — Z833 Family history of diabetes mellitus: Secondary | ICD-10-CM

## 2023-04-18 DIAGNOSIS — R161 Splenomegaly, not elsewhere classified: Secondary | ICD-10-CM | POA: Diagnosis present

## 2023-04-18 DIAGNOSIS — E119 Type 2 diabetes mellitus without complications: Secondary | ICD-10-CM | POA: Diagnosis present

## 2023-04-18 DIAGNOSIS — I5043 Acute on chronic combined systolic (congestive) and diastolic (congestive) heart failure: Secondary | ICD-10-CM | POA: Diagnosis present

## 2023-04-18 DIAGNOSIS — R160 Hepatomegaly, not elsewhere classified: Secondary | ICD-10-CM | POA: Diagnosis present

## 2023-04-18 DIAGNOSIS — J9601 Acute respiratory failure with hypoxia: Secondary | ICD-10-CM | POA: Diagnosis present

## 2023-04-18 DIAGNOSIS — Z91158 Patient's noncompliance with renal dialysis for other reason: Secondary | ICD-10-CM

## 2023-04-18 DIAGNOSIS — E8770 Fluid overload, unspecified: Secondary | ICD-10-CM | POA: Diagnosis present

## 2023-04-18 DIAGNOSIS — F411 Generalized anxiety disorder: Secondary | ICD-10-CM | POA: Diagnosis present

## 2023-04-18 LAB — BRAIN NATRIURETIC PEPTIDE: B Natriuretic Peptide: 3420 pg/mL — ABNORMAL HIGH (ref 0.0–100.0)

## 2023-04-18 LAB — CBC WITH DIFFERENTIAL/PLATELET
Abs Immature Granulocytes: 0.03 10*3/uL (ref 0.00–0.07)
Basophils Absolute: 0 10*3/uL (ref 0.0–0.1)
Basophils Relative: 0 %
Eosinophils Absolute: 0.4 10*3/uL (ref 0.0–0.5)
Eosinophils Relative: 5 %
HCT: 28.1 % — ABNORMAL LOW (ref 39.0–52.0)
Hemoglobin: 8.5 g/dL — ABNORMAL LOW (ref 13.0–17.0)
Immature Granulocytes: 1 %
Lymphocytes Relative: 12 %
Lymphs Abs: 0.8 10*3/uL (ref 0.7–4.0)
MCH: 26 pg (ref 26.0–34.0)
MCHC: 30.2 g/dL (ref 30.0–36.0)
MCV: 85.9 fL (ref 80.0–100.0)
Monocytes Absolute: 0.8 10*3/uL (ref 0.1–1.0)
Monocytes Relative: 12 %
Neutro Abs: 4.5 10*3/uL (ref 1.7–7.7)
Neutrophils Relative %: 70 %
Platelets: 477 10*3/uL — ABNORMAL HIGH (ref 150–400)
RBC: 3.27 MIL/uL — ABNORMAL LOW (ref 4.22–5.81)
RDW: 16 % — ABNORMAL HIGH (ref 11.5–15.5)
WBC: 6.4 10*3/uL (ref 4.0–10.5)
nRBC: 0 % (ref 0.0–0.2)

## 2023-04-18 LAB — I-STAT VENOUS BLOOD GAS, ED
Acid-Base Excess: 17 mmol/L — ABNORMAL HIGH (ref 0.0–2.0)
Bicarbonate: 42.1 mmol/L — ABNORMAL HIGH (ref 20.0–28.0)
Calcium, Ion: 1.04 mmol/L — ABNORMAL LOW (ref 1.15–1.40)
HCT: 33 % — ABNORMAL LOW (ref 39.0–52.0)
Hemoglobin: 11.2 g/dL — ABNORMAL LOW (ref 13.0–17.0)
O2 Saturation: 46 %
Potassium: 4.7 mmol/L (ref 3.5–5.1)
Sodium: 138 mmol/L (ref 135–145)
TCO2: 44 mmol/L — ABNORMAL HIGH (ref 22–32)
pCO2, Ven: 52.2 mmHg (ref 44–60)
pH, Ven: 7.515 — ABNORMAL HIGH (ref 7.25–7.43)
pO2, Ven: 23 mmHg — CL (ref 32–45)

## 2023-04-18 LAB — TROPONIN I (HIGH SENSITIVITY)
Troponin I (High Sensitivity): 91 ng/L — ABNORMAL HIGH (ref <18)
Troponin I (High Sensitivity): 92 ng/L — ABNORMAL HIGH (ref <18)

## 2023-04-18 MED ORDER — CHLORHEXIDINE GLUCONATE CLOTH 2 % EX PADS
6.0000 | MEDICATED_PAD | Freq: Every day | CUTANEOUS | Status: DC
  Administered 2023-04-20 – 2023-04-22 (×3): 6 via TOPICAL

## 2023-04-18 MED ORDER — LIDOCAINE-PRILOCAINE 2.5-2.5 % EX CREA: 1.0000 | TOPICAL_CREAM | CUTANEOUS | Status: DC | PRN

## 2023-04-18 MED ORDER — HEPARIN SODIUM (PORCINE) 1000 UNIT/ML IJ SOLN
2000.0000 [IU] | Freq: Once | INTRAMUSCULAR | Status: AC
  Administered 2023-04-18: 2000 [IU] via INTRAVENOUS

## 2023-04-18 MED ORDER — HEPARIN SODIUM (PORCINE) 1000 UNIT/ML DIALYSIS: 1000.0000 [IU] | INTRAMUSCULAR | Status: DC | PRN

## 2023-04-18 MED ORDER — HEPARIN SODIUM (PORCINE) 1000 UNIT/ML IJ SOLN
2000.0000 [IU] | Freq: Once | INTRAMUSCULAR | Status: AC
  Administered 2023-04-18: 2000 [IU] via INTRAVENOUS
  Filled 2023-04-18: qty 2

## 2023-04-18 MED ORDER — LIDOCAINE HCL (PF) 1 % IJ SOLN: 5.0000 mL | INTRAMUSCULAR | Status: DC | PRN

## 2023-04-18 MED ORDER — IPRATROPIUM-ALBUTEROL 0.5-2.5 (3) MG/3ML IN SOLN: 3.0000 mL | Freq: Once | RESPIRATORY_TRACT | Status: DC

## 2023-04-18 MED ORDER — PENTAFLUOROPROP-TETRAFLUOROETH EX AERO: 1.0000 | INHALATION_SPRAY | CUTANEOUS | Status: DC | PRN

## 2023-04-18 NOTE — Progress Notes (Addendum)
   04/18/23 2130  Vitals  Temp 97.7 F (36.5 C)  Temp Source Oral  BP Location Left Arm  BP Method Automatic  Patient Position (if appropriate) Lying  Pulse Rate (!) 119  Pulse Rate Source Monitor  During Treatment Monitoring  HD Safety Checks Performed Yes  Intra-Hemodialysis Comments Tx completed  Post Treatment  Dialyzer Clearance Lightly streaked  Hemodialysis Intake (mL) 0 mL  Liters Processed 95  Fluid Removed (mL) 5000 mL  Tolerated HD Treatment Yes  Post-Hemodialysis Comments pt stable  AVG/AVF Arterial Site Held (minutes) 10 minutes  AVG/AVF Venous Site Held (minutes) 10 minutes  Fistula / Graft Left Upper arm Arteriovenous fistula  Placement Date/Time: 03/04/21 1327   Placed prior to admission: No  Orientation: Left  Access Location: Upper arm  Access Type: (c) Arteriovenous fistula  Site Condition No complications  Fistula / Graft Assessment Present  Status Deaccessed  Needle Size 15  Drainage Description None   Post V/S Pt caox4 165/108 -119HR spo2 100%. Duration on HD 4hrs. Report to Lesleigh Noe RN.

## 2023-04-18 NOTE — Progress Notes (Signed)
Lab called to inform me that there was not enough blood for CMP.  Patient currently on dialysis and unable to get more blood sample.  Stacie Glaze, LPN-KDU

## 2023-04-18 NOTE — ED Provider Notes (Signed)
Last dialysis Saturday, missed Tuesday. Started  feeling more short of breath and with increased lower extremity swelling and abdominal distension for the past couple of days. Likely will need admission for dialysis and paracentesis. Missed paracentesis on Monday. Physical Exam  BP (!) 158/106 (BP Location: Right Arm)   Pulse (!) 113   Temp 98.6 F (37 C) (Oral)   Resp (!) 30   SpO2 95%   Physical Exam Vitals and nursing note reviewed.  Constitutional:      General: He is not in acute distress.    Appearance: He is well-developed.  HENT:     Head: Normocephalic and atraumatic.  Eyes:     Conjunctiva/sclera: Conjunctivae normal.  Cardiovascular:     Rate and Rhythm: Normal rate and regular rhythm.     Heart sounds: Murmur heard.  Pulmonary:     Effort: Pulmonary effort is normal. No respiratory distress.     Breath sounds: Normal breath sounds.  Abdominal:     Tenderness: There is no abdominal tenderness.     Comments: Very distended abdomen, nontender to palpation  Musculoskeletal:        General: Swelling present.     Cervical back: Neck supple.     Right lower leg: Edema present.     Left lower leg: Edema present.  Skin:    General: Skin is warm and dry.     Capillary Refill: Capillary refill takes less than 2 seconds.  Neurological:     Mental Status: He is alert.  Psychiatric:        Mood and Affect: Mood normal.     Procedures  Procedures  ED Course / MDM    Medical Decision Making Amount and/or Complexity of Data Reviewed Labs: ordered. Radiology: ordered.  Risk Prescription drug management.   27 y.o. male with pertinent past medical history of ESRD on dialysis, diabetes, hypertension presents to the ED for concern of progressive shortness of breath, bilateral lower extremity swelling, abdominal distention   ED Course:  Patient presents with worsening shortness of breath and volume overload.  He missed his dialysis on Tuesday, last dialysis was  Saturday.  He was also supposed to have a paracentesis 2 days ago which he missed.  Patient overall well-appearing, but does have elevated heart rate at 113, tachypnea at 36, elevated blood pressure at 174/116.  Denies any recent periods of immobilization, recent surgeries, hormonal use, coagulopathies, no calf pain.  Well score 1.5 points, low risk for PE.  Feel his symptoms are explained by his volume overload status due to noncompliance with dialysis.  Dr. Valentino Nose with nephrology was consulted and they believe he has acute hypoxic respiratory failure secondary to volume overload, will need to be admitted for dialysis for the next several days.  He will also need paracentesis as he missed his appointment.   Impression: ESRD requiring dialysis Fluid overload  Disposition:  Admitted to hospitalist service Return precautions given.  Lab Tests: I Ordered, and personally interpreted labs.  The pertinent results include:   CBC with no leukocytosis First and second troponin at 91 and 92 respectively BNP elevated at 3420  Imaging I personally reviewed the chest x-ray which showed cardiomegaly and pulmonary vascular congestion   Cardiac Monitoring: / EKG: The patient was maintained on a cardiac monitor.  I personally viewed and interpreted the cardiac monitored which showed an underlying rhythm of: Sinus tachycardia   Consultations Obtained: I requested consultation with the nephrologist Dr. Valentino Nose,  and discussed lab and  imaging findings as well as pertinent plan - they recommend: Admission for dialysis I requested consultation with the hospitalist,  and discussed lab and imaging findings as well as pertinent plan - they will come evaluate patient and plan to admit    Co morbidities that complicate the patient evaluation  ESRD on dialysis, diabetes, hypertension            Arabella Merles, PA-C 04/18/23 2357    Linwood Dibbles, MD 04/19/23 [phone]

## 2023-04-18 NOTE — ED Notes (Signed)
Patient refused to let lab stick him to collect blood work that is ordered. He states he is only wanting to be stuck in the hand. But he has a lot of tiny veins and scar tissue in the hands and unable to collect blood from the hands. Notified MD

## 2023-04-18 NOTE — ED Triage Notes (Signed)
Patient coming from home. Patient   Was supposed to get fluid tap on belly on Monday and did not get it done. Missed dialysis yesterday  Blood pressure high  Hx of congestive heart failure  Dialysis on Tuesday, Thursday and Saturday  Fluid over loaded. Arms, legs, and belly swollen  210/130 50 90% on 6L

## 2023-04-18 NOTE — ED Notes (Signed)
Patient given orange juice

## 2023-04-18 NOTE — Consult Note (Signed)
ESRD Consult Note  Requesting provider: Gerhard Munch Service requesting consult: ER Reason for consult: ESRD, provision of dialysis Indication for acute dialysis?: End Stage Renal Disease  Outpatient dialysis unit: [facility] Outpatient dialysis prescription: F180, BFR 500, 4hr 15 min, 2 ca, 2K, edw 105.3kg (recent post weight 136kg), L AVF, mircera 150mg  last given 7/2, hectorol w/ hd, sensipar 30mg  daily, lanthanum 1 tab w/ meals  Assessment/Recommendations: Bobby [name] is a/an 27 y.o. male with a past medical history notable for ESRD on HD admitted with shortness of breath.   # ESRD: Waiting on labs today but obviously volume overloaded.  Volume issues detailed below.  Plan for dialysis today and likely daily dialysis for the next several days.  Using home prescription but likely shortened to 4 hours.  # Volume/ hypertension: EDW 105.3 kg but not even close in the outpatient setting.  Chronic issues with compliance.  Massively volume overloaded.  Plan for hemodialysis today with aggressive ultrafiltration.  Recommend consulting IR for paracentesis.  Continue home blood pressure medications  # Anemia of Chronic Kidney Disease: Hemoglobin 9.6 recently waiting on labs today.  Has not received ESA recently.  Iron stores fairly adequate with ferritin of 941 recently hold ESA for now.  May improve with volume removal.  # Secondary Hyperparathyroidism/Hyperphosphatemia: Hold home Sensipar given lower calcium.  Hectorol 10 mcg with dialysis.  Home lanthanum.  # Vascular access: Left upper extremity AVF with no issues  # Acute hypoxic respiratory failure: Related to volume overload in the setting of noncompliance with dialysis.  Requiring dialysis and has tachypnea.  Ultrafiltration as above.  # Recurrent ascites: Mostly related to dialysis noncompliance.  Recommend IR consultation.  Dialysis as above.  # Enlarged cardiac silhouette: Present on chest x-ray.  Consider  echocardiogram.   # Additional recommendations: - Dose all meds for creatinine clearance < 10 ml/min  - Unless absolutely necessary, no MRIs with gadolinium.  - Implement save arm precautions.  Prefer needle sticks in the dorsum of the hands or wrists.  No blood pressure measurements in arm. - If blood transfusion is requested during hemodialysis sessions, please alert [place] prior to the session.  - Use synthetic opioids (Fentanyl/Dilaudid) if needed  Recommendations were discussed with the primary team.   History of Present Illness: Bobby [name] is a/an 27 y.o. male with a past medical history of ESRD who presents with shortness of breath.  Patient presents in the setting of worsening shortness of breath as well as volume overload.  Was hospitalized for the same thing couple weeks ago.  States that is slowly worsened recently.  Missed his dialysis on Tuesday.  Was post to have a paracentesis recently but also missed that.  Denies any chest pain, nausea, vomiting, diarrhea, and cough, fever, chills.  Mostly just shortness of breath as well as orthopnea.  Worse with activity.  No issues when he is on dialysis but does miss frequently.  Access working well.  In the emergency department he was noted to be massively volume overloaded.  Chest x-ray with concerns for pericardial effusion possibly but hard to say.  Pulmonary vascular congestion also present.  Hypertensive at this time.  Waiting on labs to return.   Medications:  Current Facility-Administered Medications  Medication Dose Route Frequency Provider Last Rate Last Admin   ipratropium-albuterol (DUONEB) 0.5-2.5 (3) MG/3ML nebulizer solution 3 mL  3 mL Nebulization Once Peter Garter, PA       Current Outpatient Medications  Medication Sig Dispense  Refill   acetaminophen (TYLENOL) 325 MG tablet Take 650 mg by mouth as needed for mild pain or moderate pain.     albuterol (VENTOLIN HFA) 108 (90 Base) MCG/ACT inhaler Inhale 2 puffs  into the lungs every 6 (six) hours as needed for wheezing.     carvedilol (COREG) 25 MG tablet Take 1 tablet (25 mg total) by mouth 2 (two) times daily with a meal. 60 tablet 1   cinacalcet (SENSIPAR) 30 MG tablet Take 30 mg by mouth daily.     cyclobenzaprine (FLEXERIL) 10 MG tablet Take 1 tablet (10 mg total) by mouth 2 (two) times daily as needed for muscle spasms. 20 tablet 0   Darbepoetin Alfa (ARANESP) 150 MCG/0.3ML SOSY injection Inject 0.3 mLs (150 mcg total) into the skin every Monday at 6 PM. Given with dialysis 1.68 mL 0   diclofenac Sodium (VOLTAREN) 1 % GEL Apply 4 g topically 4 (four) times daily. 100 g 0   gabapentin (NEURONTIN) 100 MG capsule Take 100 mg by mouth 3 (three) times daily.     insulin aspart (NOVOLOG) 100 UNIT/ML injection Inject 5 Units into the skin 3 (three) times daily before meals. 10 mL 1   lanthanum (FOSRENOL) 1000 MG chewable tablet Chew 1,000 mg by mouth 5 (five) times daily.     lidocaine (LIDODERM) 5 % Place 1 patch onto the skin daily. Remove & Discard patch within 12 hours or as directed by MD 30 patch 0   sacubitril-valsartan (ENTRESTO) 49-51 MG Take 1 tablet by mouth 2 (two) times daily. 60 tablet 0   sertraline (ZOLOFT) 50 MG tablet Take 50 mg by mouth daily.       ALLERGIES Patient has no known allergies.  MEDICAL HISTORY Past Medical History:  Diagnosis Date   Diabetes mellitus without complication (HCC)    Type I   ESRD (end stage renal disease) (HCC)    hemodialysis initiated 02/28/21   Hypertension      SOCIAL HISTORY Social History   Socioeconomic History   Marital status: Single    Spouse name: Not on file   Number of children: Not on file   Years of education: Not on file   Highest education level: Not on file  Occupational History   Not on file  Tobacco Use   Smoking status: Former    Types: Cigarettes   Smokeless tobacco: Never  Vaping Use   Vaping status: Never Used  Substance and Sexual Activity   Alcohol use: Never    Drug use: Yes    Types: Marijuana    Comment: daily   Sexual activity: Yes    Partners: Female    Birth control/protection: Condom  Other Topics Concern   Not on file  Social History Narrative   Not on file   Social Determinants of Health   Financial Resource Strain: Low Risk  (07/20/2022)   [facility]04[facility] prior to 11/04/2022[facility]   Overall Financial Resource Strain (CARDIA)    Difficulty of Paying Living Expenses: Not very hard  Food Insecurity: No Food Insecurity (04/02/2023)   Hunger Vital Sign    Worried About Running Out of Food in the Last Year: Never true    Ran Out of Food in the Last Year: Never true  Transportation Needs: No Transportation Needs (04/02/2023)   PRAPARE - Administrator, Civil Service (Medical): No  Lack of Transportation (Non-Medical): No  Physical Activity: Insufficiently Active (07/20/2022)   Received [facility], [facility] visits prior to 11/04/2022., [facility] visits prior to 11/04/2022., [facility]   Exercise Vital Sign    Days of Exercise per Week: 2 days    Minutes of Exercise per Session: 30 min  Stress: No Stress Concern Present (07/20/2022)   Received from [facility], [facility] visits prior to 11/04/2022., [facility] visits prior to 11/04/2022[facility]   [employer] of Occupational Health - Occupational Stress Questionnaire    Feeling of Stress : Not at all  Social Connections: Unknown (07/20/2022)   [facility] visits prior to 11/04/2022., [facility] visits prior to 11/04/2022.   Social Advertising account executive [NHANES]    Frequency of Communication with Friends and Family: More than three times a week    Frequency of Social Gatherings  with Friends and Family: More than three times a week    Attends Religious Services: Not on Marketing executive or Organizations: No    Attends Banker Meetings: Never    Marital Status: Patient refused  Intimate Partner Violence: Not At Risk (04/02/2023)   Humiliation, Afraid, Rape, and Kick questionnaire    Fear of Current or Ex-Partner: No    Emotionally Abused: No    Physically Abused: No    Sexually Abused: No     FAMILY HISTORY Family History  Problem Relation Age of Onset   Diabetes Mother    Kidney disease Other    Hypertension Other      Review of Systems: 12 systems were reviewed and negative except per HPI  Physical Exam: Vitals:   04/18/23 1445 04/18/23 1500  BP:    Pulse:    Resp: (!) 36 (!) 36  Temp:    SpO2:     No intake/output data recorded. No intake or output data in the 24 hours ending 04/18/23 1527 General: Chronically ill-appearing, lying in bed, no distress HEENT: anicteric sclera, MMM CV: Normal rate, no rub, extensive 3+ edema in the bilateral lower extremities up to the thigh Lungs: bilateral chest rise, mild increased work of breathing, crackles bilaterally Abd: soft, non-tender, distended with likely ascites Skin: no visible lesions or rashes Psych: alert, engaged, appropriate mood and affect Neuro: normal speech, no gross focal deficits   Test Results Reviewed Lab Results  Component Value Date   NA 136 04/03/2023   K 5.0 04/03/2023   CL 94 (L) 04/03/2023   CO2 28 04/03/2023   BUN 64 (H) 04/03/2023   CREATININE 9.21 (H) 04/03/2023   CALCIUM 7.7 (L) 04/03/2023   ALBUMIN <1.5 (L) 04/02/2023   PHOS 6.5 (H) 04/02/2023    I have reviewed relevant outside healthcare records

## 2023-04-18 NOTE — ED Provider Notes (Signed)
[facility] EMERGENCY DEPARTMENT AT [facility] Provider Note   CSN: [id number] Arrival date & time: 04/18/23  1340     History  Chief Complaint  Patient presents with   Fluid overload    Bobby [name] is a 27 y.o. male.  HPI   27 year old male presents emergency department with complaints of shortness of breath, abdominal distention, lower extremity swelling.  Patient with history of ESRD on hemodialysis receiving dialysis Tuesday Thursday Saturday but missed his Tuesday session.  States that he sat through his whole Saturday session but again developed some shortness of breath on Sunday.  States that symptoms worsened since onset on Sunday.  Also states that he supposed to have paracentesis on Monday but was having difficulty getting help/a ride to set appointment.  Denies any chest pain, fever, cough, abdominal pain, nausea, vomiting, change in bowel habits.  Patient states that he does not make urine.  Past medical history significant for ESRD on dialysis, CHF, diabetes mellitus, hypertension, iron deficiency anemia, hyperparathyroidism  Home Medications Prior to Admission medications   Medication Sig Start Date End Date Taking? Authorizing Provider  acetaminophen (TYLENOL) 325 MG tablet Take 650 mg by mouth as needed for mild pain or moderate pain. 09/30/22   [provider]  albuterol (VENTOLIN HFA) 108 (90 Base) MCG/ACT inhaler Inhale 2 puffs into the lungs every 6 (six) hours as needed for wheezing. 03/30/23 06/28/23  [provider]  carvedilol (COREG) 25 MG tablet Take 1 tablet (25 mg total) by mouth 2 (two) times daily with a meal. 03/12/21   Ghimire, Werner Lean, MD  cinacalcet (SENSIPAR) 30 MG tablet Take 30 mg by mouth daily.    [provider]  cyclobenzaprine (FLEXERIL) 10 MG tablet Take 1 tablet (10 mg total) by mouth 2 (two) times daily as needed for muscle spasms. 04/03/23   Kathleen Lime, MD  Darbepoetin Alfa (ARANESP) 150  MCG/0.3ML SOSY injection Inject 0.3 mLs (150 mcg total) into the skin every Monday at 6 PM. Given with dialysis 04/09/23   Kathleen Lime, MD  diclofenac Sodium (VOLTAREN) 1 % GEL Apply 4 g topically 4 (four) times daily. 04/03/23   Kathleen Lime, MD  gabapentin (NEURONTIN) 100 MG capsule Take 100 mg by mouth 3 (three) times daily. 03/24/23   [provider]  insulin aspart (NOVOLOG) 100 UNIT/ML injection Inject 5 Units into the skin 3 (three) times daily before meals. 03/12/21   Ghimire, Werner Lean, MD  lanthanum (FOSRENOL) 1000 MG chewable tablet Chew 1,000 mg by mouth 5 (five) times daily.    [provider]  lidocaine (LIDODERM) 5 % Place 1 patch onto the skin daily. Remove & Discard patch within 12 hours or as directed by MD 04/04/23   Kathleen Lime, MD  sacubitril-valsartan (ENTRESTO) 49-51 MG Take 1 tablet by mouth 2 (two) times daily. 04/03/23 05/03/23  Kathleen Lime, MD  sertraline (ZOLOFT) 50 MG tablet Take 50 mg by mouth daily. 03/26/23   [provider]      Allergies    Patient has no known allergies.    Review of Systems   Review of Systems  All other systems reviewed and are negative.   Physical Exam Updated Vital Signs BP (!) 174/116   Pulse (!) 113   Temp 98.2 F (36.8 C) (Oral)   Resp (!) 36   SpO2 100%  Physical Exam Vitals and nursing note reviewed.  Constitutional:      General: He is not in acute distress.  Appearance: He is well-developed.  HENT:     Head: Normocephalic and atraumatic.  Eyes:     Conjunctiva/sclera: Conjunctivae normal.  Cardiovascular:     Rate and Rhythm: Regular rhythm. Tachycardia present.  Pulmonary:     Effort: No respiratory distress.     Comments: Patient tachypneic with diffuse Rales/wheeze auscultated in bilateral lung fields. Abdominal:     General: There is distension.     Palpations: Abdomen is soft.     Tenderness: There is no abdominal tenderness. There is no guarding.  Musculoskeletal:         General: No swelling.     Cervical back: Neck supple.     Right lower leg: Edema present.     Left lower leg: Edema present.  Skin:    General: Skin is warm and dry.     Capillary Refill: Capillary refill takes less than 2 seconds.  Neurological:     Mental Status: He is alert.  Psychiatric:        Mood and Affect: Mood normal.     ED Results / Procedures / Treatments   Labs (all labs ordered are listed, but only abnormal results are displayed) Labs Reviewed  SARS CORONAVIRUS 2 BY RT PCR  COMPREHENSIVE METABOLIC PANEL  CBC WITH DIFFERENTIAL/PLATELET  BRAIN NATRIURETIC PEPTIDE  I-STAT VENOUS BLOOD GAS, ED  TROPONIN I (HIGH SENSITIVITY)    EKG None  Radiology No results found.  Procedures Procedures    Medications Ordered in ED Medications  ipratropium-albuterol (DUONEB) 0.5-2.5 (3) MG/3ML nebulizer solution 3 mL (has no administration in time range)    ED Course/ Medical Decision Making/ A&P                                 Medical Decision Making Amount and/or Complexity of Data Reviewed Labs: ordered. Radiology: ordered.  Risk Prescription drug management.   This patient presents to the ED for concern of shortness of breath, this involves an extensive number of treatment options, and is a complaint that carries with it a high risk of complications and morbidity.  The differential diagnosis includes The causes for shortness of breath include but are not limited to Cardiac (AHF, pericardial effusion and tamponade, arrhythmias, ischemia, etc) Respiratory (COPD, asthma, pneumonia, pneumothorax, primary pulmonary hypertension, PE/VQ mismatch) Hematological (anemia)  Co morbidities that complicate the patient evaluation  See HPI   Additional history obtained:  Additional history obtained from EMR External records from outside source obtained and reviewed including hospital records   Lab Tests:  I Ordered, and personally interpreted labs which are  pending   Imaging Studies ordered:  I ordered imaging studies including chest x-ray which are pending   Cardiac Monitoring: / EKG:  The patient was maintained on a cardiac monitor.  I personally viewed and interpreted the cardiac monitored which showed an underlying rhythm of: Tachycardia   Consultations Obtained:  N/a   Problem List / ED Course / Critical interventions / Medication management  Volume overload I ordered medication including DuoNeb    Social Determinants of Health:  Medical noncompliance.  Marijuana use.   Test / Admission - Considered:  Volume overload Vitals signs significant for tachycardia, tachypnea, hypertension. Otherwise within normal range and stable throughout visit. Laboratory/imaging studies significant for: See above 27 year old male presents emergency department, states shortness of breath and worsening swelling of lower extremities as well as abdomen.  On exam, patient with diffuse volume overload  with pitting edema up to xiphoid.  Most of patient's workup still pending at this time.  Suspect patient will be admitted given significant abdominal distention requiring paracentesis that was performed on Monday as well as evidence of volume overload status requiring dialysis given the patient does not wake urine.  At shift change, patient care handed off to [facility].  Patient stable upon shift change.         Final Clinical Impression(s) / ED Diagnoses Final diagnoses:  None    Rx / DC Orders ED Discharge Orders     None         Peter Garter, [place] 04/18/23 1518    Gerhard Munch, MD 04/18/23 1557

## 2023-04-19 ENCOUNTER — Other Ambulatory Visit (HOSPITAL_COMMUNITY): Payer: Medicaid Other

## 2023-04-19 ENCOUNTER — Inpatient Hospital Stay (HOSPITAL_COMMUNITY): Payer: Medicaid Other

## 2023-04-19 DIAGNOSIS — E8770 Fluid overload, unspecified: Secondary | ICD-10-CM | POA: Diagnosis present

## 2023-04-19 DIAGNOSIS — E872 Acidosis, unspecified: Secondary | ICD-10-CM | POA: Diagnosis present

## 2023-04-19 DIAGNOSIS — I3139 Other pericardial effusion (noninflammatory): Secondary | ICD-10-CM | POA: Diagnosis present

## 2023-04-19 DIAGNOSIS — E119 Type 2 diabetes mellitus without complications: Secondary | ICD-10-CM | POA: Diagnosis present

## 2023-04-19 DIAGNOSIS — N2581 Secondary hyperparathyroidism of renal origin: Secondary | ICD-10-CM | POA: Diagnosis present

## 2023-04-19 DIAGNOSIS — Z992 Dependence on renal dialysis: Secondary | ICD-10-CM | POA: Diagnosis not present

## 2023-04-19 DIAGNOSIS — J9601 Acute respiratory failure with hypoxia: Secondary | ICD-10-CM | POA: Diagnosis present

## 2023-04-19 DIAGNOSIS — K7460 Unspecified cirrhosis of liver: Secondary | ICD-10-CM | POA: Diagnosis present

## 2023-04-19 DIAGNOSIS — Z1152 Encounter for screening for COVID-19: Secondary | ICD-10-CM | POA: Diagnosis not present

## 2023-04-19 DIAGNOSIS — Z91158 Patient's noncompliance with renal dialysis for other reason: Secondary | ICD-10-CM | POA: Diagnosis not present

## 2023-04-19 DIAGNOSIS — F329 Major depressive disorder, single episode, unspecified: Secondary | ICD-10-CM | POA: Diagnosis present

## 2023-04-19 DIAGNOSIS — I132 Hypertensive heart and chronic kidney disease with heart failure and with stage 5 chronic kidney disease, or end stage renal disease: Secondary | ICD-10-CM | POA: Diagnosis present

## 2023-04-19 DIAGNOSIS — W19XXXA Unspecified fall, initial encounter: Secondary | ICD-10-CM | POA: Diagnosis not present

## 2023-04-19 DIAGNOSIS — I50810 Right heart failure, unspecified: Secondary | ICD-10-CM | POA: Diagnosis not present

## 2023-04-19 DIAGNOSIS — H5462 Unqualified visual loss, left eye, normal vision right eye: Secondary | ICD-10-CM | POA: Diagnosis present

## 2023-04-19 DIAGNOSIS — D631 Anemia in chronic kidney disease: Secondary | ICD-10-CM | POA: Diagnosis present

## 2023-04-19 DIAGNOSIS — Z79899 Other long term (current) drug therapy: Secondary | ICD-10-CM | POA: Diagnosis not present

## 2023-04-19 DIAGNOSIS — Z87891 Personal history of nicotine dependence: Secondary | ICD-10-CM | POA: Diagnosis not present

## 2023-04-19 DIAGNOSIS — M549 Dorsalgia, unspecified: Secondary | ICD-10-CM | POA: Diagnosis present

## 2023-04-19 DIAGNOSIS — Z794 Long term (current) use of insulin: Secondary | ICD-10-CM | POA: Diagnosis not present

## 2023-04-19 DIAGNOSIS — R109 Unspecified abdominal pain: Secondary | ICD-10-CM | POA: Diagnosis not present

## 2023-04-19 DIAGNOSIS — I5043 Acute on chronic combined systolic (congestive) and diastolic (congestive) heart failure: Secondary | ICD-10-CM | POA: Diagnosis present

## 2023-04-19 DIAGNOSIS — F411 Generalized anxiety disorder: Secondary | ICD-10-CM | POA: Diagnosis present

## 2023-04-19 DIAGNOSIS — E8809 Other disorders of plasma-protein metabolism, not elsewhere classified: Secondary | ICD-10-CM | POA: Diagnosis present

## 2023-04-19 DIAGNOSIS — N186 End stage renal disease: Secondary | ICD-10-CM

## 2023-04-19 DIAGNOSIS — R188 Other ascites: Secondary | ICD-10-CM | POA: Diagnosis present

## 2023-04-19 HISTORY — PX: IR PARACENTESIS: IMG2679

## 2023-04-19 LAB — COMPREHENSIVE METABOLIC PANEL
ALT: 16 U/L (ref 0–44)
ALT: 16 U/L (ref 0–44)
AST: 21 U/L (ref 15–41)
AST: 26 U/L (ref 15–41)
Albumin: 1.7 g/dL — ABNORMAL LOW (ref 3.5–5.0)
Albumin: 1.9 g/dL — ABNORMAL LOW (ref 3.5–5.0)
Alkaline Phosphatase: 89 U/L (ref 38–126)
Alkaline Phosphatase: 92 U/L (ref 38–126)
Anion gap: 14 (ref 5–15)
Anion gap: 16 — ABNORMAL HIGH (ref 5–15)
BUN: 44 mg/dL — ABNORMAL HIGH (ref 6–20)
BUN: 45 mg/dL — ABNORMAL HIGH (ref 6–20)
CO2: 33 mmol/L — ABNORMAL HIGH (ref 22–32)
CO2: 33 mmol/L — ABNORMAL HIGH (ref 22–32)
Calcium: 8.7 mg/dL — ABNORMAL LOW (ref 8.9–10.3)
Calcium: 9 mg/dL (ref 8.9–10.3)
Chloride: 90 mmol/L — ABNORMAL LOW (ref 98–111)
Chloride: 91 mmol/L — ABNORMAL LOW (ref 98–111)
Creatinine, Ser: 8.9 mg/dL — ABNORMAL HIGH (ref 0.61–1.24)
Creatinine, Ser: 9.28 mg/dL — ABNORMAL HIGH (ref 0.61–1.24)
GFR, Estimated: 7 mL/min — ABNORMAL LOW (ref >60)
GFR, Estimated: 8 mL/min — ABNORMAL LOW (ref >60)
Glucose, Bld: 136 mg/dL — ABNORMAL HIGH (ref 70–99)
Glucose, Bld: 85 mg/dL (ref 70–99)
Potassium: 4.6 mmol/L (ref 3.5–5.1)
Potassium: 4.8 mmol/L (ref 3.5–5.1)
Sodium: 138 mmol/L (ref 135–145)
Sodium: 139 mmol/L (ref 135–145)
Total Bilirubin: 1.1 mg/dL (ref 0.3–1.2)
Total Bilirubin: 1.4 mg/dL — ABNORMAL HIGH (ref 0.3–1.2)
Total Protein: 6.8 g/dL (ref 6.5–8.1)
Total Protein: 7.9 g/dL (ref 6.5–8.1)

## 2023-04-19 LAB — CBC
HCT: 27.8 % — ABNORMAL LOW (ref 39.0–52.0)
Hemoglobin: 8.3 g/dL — ABNORMAL LOW (ref 13.0–17.0)
MCH: 25.2 pg — ABNORMAL LOW (ref 26.0–34.0)
MCHC: 29.9 g/dL — ABNORMAL LOW (ref 30.0–36.0)
MCV: 84.5 fL (ref 80.0–100.0)
Platelets: 429 10*3/uL — ABNORMAL HIGH (ref 150–400)
RBC: 3.29 MIL/uL — ABNORMAL LOW (ref 4.22–5.81)
RDW: 15.9 % — ABNORMAL HIGH (ref 11.5–15.5)
WBC: 6.3 10*3/uL (ref 4.0–10.5)
nRBC: 0 % (ref 0.0–0.2)

## 2023-04-19 LAB — HEPATITIS B SURFACE ANTIGEN: Hepatitis B Surface Ag: NONREACTIVE

## 2023-04-19 LAB — GLUCOSE, CAPILLARY
Glucose-Capillary: 106 mg/dL — ABNORMAL HIGH (ref 70–99)
Glucose-Capillary: 93 mg/dL (ref 70–99)
Glucose-Capillary: 132 mg/dL — ABNORMAL HIGH (ref 70–99)
Glucose-Capillary: 100 mg/dL — ABNORMAL HIGH (ref 70–99)

## 2023-04-19 LAB — MAGNESIUM: Magnesium: 1.7 mg/dL (ref 1.7–2.4)

## 2023-04-19 LAB — SARS CORONAVIRUS 2 BY RT PCR: SARS Coronavirus 2 by RT PCR: NOT DETECTED — AB

## 2023-04-19 LAB — HEPATITIS B SURFACE ANTIBODY, QUANTITATIVE: Hep B S AB Quant (Post): 864 m[IU]/mL

## 2023-04-19 MED ORDER — LIDOCAINE HCL 1 % IJ SOLN
INTRAMUSCULAR | Status: AC
  Filled 2023-04-19: qty 20

## 2023-04-19 MED ORDER — HYDROMORPHONE HCL 1 MG/ML IJ SOLN
0.2500 mg | Freq: Once | INTRAMUSCULAR | Status: AC
  Administered 2023-04-19: 0.25 mg via INTRAVENOUS
  Filled 2023-04-19: qty 1

## 2023-04-19 MED ORDER — DICLOFENAC SODIUM 1 % EX GEL
4.0000 g | Freq: Four times a day (QID) | CUTANEOUS | Status: DC
  Administered 2023-04-19 – 2023-04-20 (×2): 4 g via TOPICAL
  Filled 2023-04-19: qty 100

## 2023-04-19 MED ORDER — LABETALOL HCL 5 MG/ML IV SOLN
10.0000 mg | INTRAVENOUS | Status: DC | PRN
  Administered 2023-04-19: 10 mg via INTRAVENOUS
  Filled 2023-04-19: qty 4

## 2023-04-19 MED ORDER — ACETAMINOPHEN 650 MG RE SUPP: 650.0000 mg | Freq: Four times a day (QID) | RECTAL | Status: DC | PRN

## 2023-04-19 MED ORDER — MAGNESIUM SULFATE 2 GM/50ML IV SOLN
2.0000 g | Freq: Once | INTRAVENOUS | Status: AC
  Administered 2023-04-19: 2 g via INTRAVENOUS
  Filled 2023-04-19: qty 50

## 2023-04-19 MED ORDER — SERTRALINE HCL 50 MG PO TABS
50.0000 mg | ORAL_TABLET | Freq: Every day | ORAL | Status: DC
  Administered 2023-04-19 – 2023-04-25 (×6): 50 mg via ORAL
  Filled 2023-04-19 (×7): qty 1

## 2023-04-19 MED ORDER — CINACALCET HCL 30 MG PO TABS
30.0000 mg | ORAL_TABLET | Freq: Every day | ORAL | Status: DC
  Administered 2023-04-19: 30 mg via ORAL
  Filled 2023-04-19 (×2): qty 1

## 2023-04-19 MED ORDER — LIDOCAINE HCL 1 % IJ SOLN
20.0000 mL | Freq: Once | INTRAMUSCULAR | Status: AC
  Administered 2023-04-19: 10 mL

## 2023-04-19 MED ORDER — MELATONIN 3 MG PO TABS
3.0000 mg | ORAL_TABLET | Freq: Every day | ORAL | Status: DC
  Administered 2023-04-19 – 2023-04-24 (×6): 3 mg via ORAL
  Filled 2023-04-19 (×6): qty 1

## 2023-04-19 MED ORDER — ACETAMINOPHEN 325 MG PO TABS
650.0000 mg | ORAL_TABLET | Freq: Four times a day (QID) | ORAL | Status: DC | PRN
  Administered 2023-04-19: 650 mg via ORAL
  Filled 2023-04-19 (×3): qty 2

## 2023-04-19 MED ORDER — CYCLOBENZAPRINE HCL 10 MG PO TABS
10.0000 mg | ORAL_TABLET | Freq: Two times a day (BID) | ORAL | Status: DC | PRN
  Filled 2023-04-19: qty 1

## 2023-04-19 MED ORDER — SENNOSIDES-DOCUSATE SODIUM 8.6-50 MG PO TABS: 1.0000 | ORAL_TABLET | Freq: Every evening | ORAL | Status: DC | PRN

## 2023-04-19 MED ORDER — SACUBITRIL-VALSARTAN 49-51 MG PO TABS
1.0000 | ORAL_TABLET | Freq: Two times a day (BID) | ORAL | Status: DC
  Administered 2023-04-19 – 2023-04-25 (×10): 1 via ORAL
  Filled 2023-04-19 (×13): qty 1

## 2023-04-19 MED ORDER — LANTHANUM CARBONATE 500 MG PO CHEW
1000.0000 mg | CHEWABLE_TABLET | Freq: Three times a day (TID) | ORAL | Status: DC
  Administered 2023-04-19 – 2023-04-24 (×6): 1000 mg via ORAL
  Filled 2023-04-19 (×9): qty 2

## 2023-04-19 MED ORDER — LANTHANUM CARBONATE 500 MG PO CHEW: 1000.0000 mg | CHEWABLE_TABLET | Freq: Two times a day (BID) | ORAL | Status: DC | PRN

## 2023-04-19 MED ORDER — HEPARIN SODIUM (PORCINE) 5000 UNIT/ML IJ SOLN
5000.0000 [IU] | Freq: Three times a day (TID) | INTRAMUSCULAR | Status: DC
  Administered 2023-04-19 – 2023-04-24 (×12): 5000 [IU] via SUBCUTANEOUS
  Filled 2023-04-19 (×14): qty 1

## 2023-04-19 MED ORDER — INSULIN ASPART 100 UNIT/ML IJ SOLN
0.0000 [IU] | Freq: Three times a day (TID) | INTRAMUSCULAR | Status: DC
  Administered 2023-04-23 – 2023-04-25 (×2): 1 [IU] via SUBCUTANEOUS

## 2023-04-19 MED ORDER — INSULIN ASPART 100 UNIT/ML IJ SOLN: 0.0000 [IU] | Freq: Every day | INTRAMUSCULAR | Status: DC

## 2023-04-19 MED ORDER — GABAPENTIN 100 MG PO CAPS
100.0000 mg | ORAL_CAPSULE | Freq: Three times a day (TID) | ORAL | Status: DC
  Administered 2023-04-19 – 2023-04-25 (×14): 100 mg via ORAL
  Filled 2023-04-19 (×16): qty 1

## 2023-04-19 MED ORDER — CARVEDILOL 25 MG PO TABS
25.0000 mg | ORAL_TABLET | Freq: Two times a day (BID) | ORAL | Status: DC
  Administered 2023-04-19 – 2023-04-25 (×10): 25 mg via ORAL
  Filled 2023-04-19 (×11): qty 1

## 2023-04-19 MED ORDER — LANTHANUM CARBONATE 500 MG PO CHEW: 1000.0000 mg | CHEWABLE_TABLET | Freq: Every day | ORAL | Status: DC

## 2023-04-19 MED ORDER — HEPARIN SODIUM (PORCINE) 1000 UNIT/ML IJ SOLN
INTRAMUSCULAR | Status: AC
  Administered 2023-04-19: 1000 [IU]
  Filled 2023-04-19: qty 4

## 2023-04-19 NOTE — Hospital Course (Addendum)
  Volume overload ESRD on HD, TTS Anemia of chronic disease  HFrEF (EF 30-35%) Patient  a 27 year old male with past medical history of heart failure reduced ejection fraction, end-stage renal disease on dialysis Tuesday Thursdays and Saturdays who presented to the emergency department due to shortness of breath and chest pain found to have volume overload in the setting of missing dialysis.  During this hospitalization, Bobby [name] was consulted for which she has had frequent dialysis during this hospitalization.  Due to concerns for recurrent ascites abdominal discomfort interventional radiology did 1 session of paracentesis.  Patient has had a total of 14.7 L fluid removed in this hospitalization.  His weight is down to 125 from 137 when he first came in.  He has remained stable over the past couple of days and we are discharging him today to follow-up with his home dialysis schedule.   Type 1 vs Type 2 Diabetes Mellitus Patient reported history of diabetes but uncertain if it is type I or type II.  Patient does not follow-up with an outpatient endocrinologist so it is unclear exactly what type of diabetes the patient has.During this hospitalization ,his glucose have been ranging between 85-160 on a SSI. (  CBG 70 - 120: 0 units CBG 121 - 150: 0 units CBG 151 - 200: 1 unit CBG 201-250: 2 units CBG 251-300: 3 units CBG 301-350: 4 units CBG 351-400: 5 units CBG > 400: Give 6 units)  To further clarify this diabetes with unspecified type, we sent for  anti-insulin, GAD65, IA2, and ZnT8 antibodies for which we are following up on.  We are discharging the patient today on his home insulin regimen,5 units of NovoLog 3 times daily   Hypertension Patients has a hx of hypertension,presented with a BP on coreg 25 mg, Entresto 49-51 mg.We will continue on this same regimen on discharge.

## 2023-04-19 NOTE — Progress Notes (Signed)
PT Cancellation Note  Patient Details Name: Kurt Houghland MRN: [id number] DOB: Nov 17, 1995   Cancelled Treatment:    Reason Eval/Treat Not Completed: Patient at procedure or test/unavailable (Pt for echo and vascular labs today in the afternoon when attempted. Will follow up as able and appropriate.)  Harrel Carina, DPT, CLT  Acute Rehabilitation Services Office: [phone] (Secure chat preferred)   Claudia Desanctis 04/19/2023, 3:51 PM

## 2023-04-19 NOTE — Progress Notes (Signed)
OT Cancellation Note  Patient Details Name: Bobby [name] MRN: [id number] DOB: [birth year]   Cancelled Treatment:    Reason Eval/Treat Not Completed: Patient at procedure or test/ unavailable. Pt at HD, OT will follow up next available time as appropriate  Galen Manila 04/19/2023, 10:51 AM

## 2023-04-19 NOTE — Progress Notes (Signed)
Patient refuses to be placed back on telemetry, patient educated and informed   Fabian Sharp RN

## 2023-04-19 NOTE — H&P (Addendum)
Date: 04/19/2023               Patient Name:  Bobby [name] MRN: [id number]  DOB: [birth year] Age / Sex: 27 y.o., male   PCP: [facility]         Medical Service: Internal Medicine Teaching Service         Attending Physician: Dr. Heide Spark       *Please do not leave messages for physician in this sticky note. Please page appropriate physician as below.*   Weekday Hours (7AM-5PM):   First Contact: Annett Fabian, MD        Pager: Lurlean Nanny [phone]        Second Contact: Elza Rafter, DO    Pager: HC [phone]    Chief Complaint: Shortness of breath  History of Present Illness:  Pt is a 27 year old man with PMHx of HFrEF, ESRD on HD TTS, T1DM, and HTN presenting to the ED with worsening shortness of breath.   Pt was hospitalized from 07/28-07/30 and discharged from the hospital after getting inpatient dialysis. Since leaving the hospital pt has missed some sessions of HD most recently he missed his dialysis on Tuesday. States he has been having worsening shortness of breath. He denies any other symptoms such as nausea, vomiting, chest pain. States he is having back pain and abdominal discomfort. At baseline pt is anuric.   Review of Systems negative unless stated in the HPI.  In the ED, labs obtained that were notable for normal electrolytes, AGMA, no leukocytosis, stable anemia. Elevated but flat troponins. Elevated but stable BNP. CXR showed concern for volume overload with development of left pleural effusion vs atelectasis. Nephrology was consulted and they recommended inpatient dialysis. IMTS consulted for admission.   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 Recurrent Ascites   Meds: Current Outpatient Medications  Medication Instructions   acetaminophen (TYLENOL) 650 mg, Oral, As needed   albuterol (VENTOLIN HFA) 108 (90 Base) MCG/ACT inhaler 2 puffs, Inhalation, Every 6 hours PRN   Aranesp  (Albumin Free) 150 mcg, Subcutaneous, Every Mon (1800), Given with dialysis   carvedilol (COREG) 25 mg, Oral, 2 times daily with meals   cinacalcet (SENSIPAR) 30 mg, Oral, Daily   cyclobenzaprine (FLEXERIL) 10 mg, Oral, 2 times daily PRN   diclofenac Sodium (VOLTAREN) 4 g, Topical, 4 times daily   gabapentin (NEURONTIN) 100 mg, Oral, 3 times daily   insulin aspart (NOVOLOG) 5 Units, Subcutaneous, 3 times daily before meals   lanthanum (FOSRENOL) 1,000 mg, Oral, 5 times daily   lidocaine (LIDODERM) 5 % 1 patch, Transdermal, Daily, Remove & Discard patch within 12 hours or as directed by MD   sacubitril-valsartan (ENTRESTO) 49-51 MG 1 tablet, Oral, 2 times daily   sertraline (ZOLOFT) 50 mg, Oral, Daily   Velphoro 500 mg, Oral, 2 times daily    Allergies: NKDA  Past Surgical History: Fistula placement in 2022  Family History:  HTN: Mother DM: Mother  Social History:  Lives with sister, has a son who is six years old named Zion Tobacco- no tobacco use EtOH-  no alcohol use Illicit drug use- smokes marijuana regularly IADLs/ADLs- can perform independently at baseline   Physical Exam: Blood pressure (!) 167/105, pulse (!) 119, temperature 97.7 F (36.5 C), temperature source Oral, resp. rate 16, SpO2 97%. 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, MMM Lungs: dminished lung  sounds, No rales, or wheeze heard Cardiovascular: sinus tachycardia, good radial pulse, 3 + lower extremity edema Abdomen: Abdomen distended, bowel sounds present but distant, no TTP MSK: no asymmetry, moving all extremities, has fistula on LUE Skin: small ulceration noted on left foot Neuro: alert and oriented x4 Psych: agitated mood  Diagnostics:     Latest Ref Rng & Units 04/18/2023   11:40 PM 04/18/2023    5:00 PM 04/03/2023   12:48 AM  CBC  WBC 4.0 - 10.5 K/uL  6.4  7.2   Hemoglobin 13.0 - 17.0 g/dL 47.8  8.5  9.6   Hematocrit 39.0 - 52.0 % 33.0  28.1  30.9    Platelets 150 - 400 K/uL  477  433        Latest Ref Rng & Units 04/18/2023   11:40 PM 04/18/2023   11:32 PM 04/03/2023   12:48 AM  CMP  Glucose 70 - 99 mg/dL  85  295   BUN 6 - 20 mg/dL  44  64   Creatinine 6.21 - 1.24 mg/dL  3.08  6.57   Sodium 846 - 145 mmol/L 138  139  136   Potassium 3.5 - 5.1 mmol/L 4.7  4.8  5.0   Chloride 98 - 111 mmol/L  90  94   CO2 22 - 32 mmol/L  33  28   Calcium 8.9 - 10.3 mg/dL  9.0  7.7   Total Protein 6.5 - 8.1 g/dL  7.9    Total Bilirubin 0.3 - 1.2 mg/dL  1.4    Alkaline Phos 38 - 126 U/L  92    AST 15 - 41 U/L  26    ALT 0 - 44 U/L  16      DG Chest Port 1 View  Result Date: 04/18/2023 CLINICAL DATA:  Shortness of breath. EXAM: PORTABLE CHEST 1 VIEW COMPARISON:  04/01/2023 FINDINGS: There is marked enlargement of the cardiac silhouette which appears progressive when compared with study from 08/08/2022 and is new when compared with 06/05/2019. Imaging findings raise the concern for underlying pericardial effusion. Diminished aeration to the left base may reflect pleural effusion, atelectasis or infiltrate. Pulmonary vascular congestion is noted elsewhere in the lungs. IMPRESSION: 1. Marked enlargement of the cardiac silhouette which appears progressive when compared with study from 08/08/2022 and is new when compared with 06/05/2019. Imaging findings raise the concern for underlying pericardial effusion. 2. Diminished aeration to the left base may reflect pleural effusion, atelectasis or infiltrate. 3. Pulmonary vascular congestion. Electronically Signed   By: Signa Kell M.D.   On: 04/18/2023 15:19     EKG: personally reviewed my interpretation is sinus tachycardia with low voltages. No ST changes.   CXR: personally reviewed my interpretation is cardiomegaly and pulmonary edema.  Assessment & Plan by Problem: Patient is a 27 year old male with ESRD on hemodialysis and heart failure with reduced ejection fraction with history of nonadherence with  hemodialysis sessions presenting with dyspnea and orthopnea found to have significant volume overload with plans for inpatient hemodialysis.  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  He reports he does miss his sessions but tries his best to make it to his sessions.  He reported some blurry vision on the day of dialysis which caused him to miss a session but  this has resolved.  Nephrology has been consulted and plan for inpatient hemodialysis to achieve a new trial for the patient.  Estimated dry weight is 105 kg patient's last weight on discharge was 130 kg. Weight this admission is 137 kg.  Patient was supposed to have paracentesis on Monday but missed his appointment due to car troubles.  Plan is for inpatient hemodialysis and IR consult for paracentesis.  Patient does have significant volume overload and chest x-ray suggestive of pericardial effusion.  Will get echocardiogram but suspect this will improve with volume removal.  Patient is hemodynamically stable and comfortable. Will resume pt's phosphorus binder. Will place on renal diet with fluid limit of 1.5 L.  HTN: Chronic. Home meds are Coreg 25 mg BID, and Entresto 49/51 mg daily. Will resume coreg 25 mg but hold Entresto until pt is more euvolemic and has been adequately dialyzed. IV labetalol ordered with parameters.   T1DM vs T2DM: Chronic. Pt states he has T1DM. Will need to investigate if pt has T1DM vs T2DM given he had c-peptide level of 16 in 07/2021. On insulin 5 units with meals and gabapentin for neuropathy. Will place sSSI here.   Anemia of chronic disease: Hgb 8.5. Suspect secondary to volume overload. Goal is >10. He receives aranesp Monday with HD.   Back Pain: Pt with fall. No spinal tenderness. Will continue conservative measures here. Pt  was recently seen by PT/OT last admission and they recommended continuing working with him. Order placed.   MDD/GAD: On sertraline 50 mg. Will continue here.   DVT prophx: Heparin Diet: Renal Bowel: PRN Code: Full  Prior to Admission Living Arrangement: Home Anticipated Discharge Location: Home Barriers to Discharge: Medical Workup  Dispo: Admit patient to Inpatient with expected length of stay greater than 2 midnights.  Gwenevere Abbot, MD Eligha Bridegroom. [facility] Internal Medicine Residency, PGY-3 Pager: [phone] After 5 pm or weekends:  1st Contact: Pager: [phone]  2nd Contact: Pager: [phone]

## 2023-04-19 NOTE — TOC CM/SW Note (Addendum)
Transition of Care [facility]) - Inpatient Brief Assessment   Patient Details  Name: Bobby [name] MRN: [id number] Date of Birth: May 06, 1996  [facility] CM/SW Contact:    Tom-Johnson, Hershal Coria, RN Phone Number: 04/19/2023, 3:27 PM   Clinical Narrative:  Patient presented to the ED with Shortness Of Breath, Abdominal Distention and LE Swelling. Admitted for Volume Overload.  Patient has Hx of ESRD and on outpatient Dialysis TTS schedule. Patient uses [facility] Transportation to and from Dialysis.  Patient also receives outpatient Paracentesis. Nephrology following.   Awaits PT/OT eval. Patient not Medically ready for discharge.  CM will continue to follow as patient progresses with care towards discharge.      Transition of Care Asessment: Insurance and Status: Insurance coverage has been reviewed Patient has primary care physician: Yes Home environment has been reviewed: Yes Prior level of function:: Independent Prior/Current Home Services: No current home services Social Determinants of Health Reivew: SDOH reviewed no interventions necessary Readmission risk has been reviewed: Yes Transition of care needs: transition of care needs identified, TOC will continue to follow

## 2023-04-19 NOTE — Progress Notes (Signed)
[facility] Progress Note   Subjective:    Seen and examined patient at bedside. Noted patient signed off HD 2 hrs early today d/t cramping. He is still profoundly overloaded. S/p paracentesis today as well. He's on/off nasal cannula. Would benefit with serial HD for the next few days. Plan for HD again tomorrow.  Objective Vitals:   04/19/23 1232 04/19/23 1236 04/19/23 1258 04/19/23 1430  BP: (!) 160/100  (!) 166/103 (!) 180/120  Pulse: (!) 115   (!) 120  Resp: 18  18   Temp: 98.3 F (36.8 C)  98.2 F (36.8 C)   TempSrc: Oral     SpO2: 96%  100%   Weight:  (!) 136.5 kg    Height:       Physical Exam General: Awake, alert, NAD Heart: S1 and S2; No MRGs Lungs: Clear anteriorly, fine rales bilateral lower lobes Abdomen: Large and distended Extremities:2-3+ edema bilateral lower extremities Dialysis Access: L AVF (+) B/T   Filed Weights   04/19/23 0210 04/19/23 1005 04/19/23 1236  Weight: (!) 137.7 kg (!) 138.2 kg (!) 136.5 kg    Intake/Output Summary (Last 24 hours) at 04/19/2023 1548 Last data filed at 04/19/2023 1300 Gross per 24 hour  Intake 480 ml  Output 6700 ml  Net -6220 ml    Additional Objective Labs: Basic Metabolic Panel: Recent Labs  Lab 04/18/23 2332 04/18/23 2340 04/19/23 0448  NA 139 138 138  K 4.8 4.7 4.6  CL 90*  --  91*  CO2 33*  --  33*  GLUCOSE 85  --  136*  BUN 44*  --  45*  CREATININE 8.90*  --  9.28*  CALCIUM 9.0  --  8.7*   Liver Function Tests: Recent Labs  Lab 04/18/23 2332 04/19/23 0448  AST 26 21  ALT 16 16  ALKPHOS 92 89  BILITOT 1.4* 1.1  PROT 7.9 6.8  ALBUMIN 1.9* 1.7*   No results for input(s): "LIPASE", "AMYLASE" in the last 168 hours. CBC: Recent Labs  Lab 04/18/23 1700 04/18/23 2340 04/19/23 0448  WBC 6.4  --  6.3  NEUTROABS 4.5  --   --   HGB 8.5* 11.2* 8.3*  HCT 28.1* 33.0* 27.8*  MCV 85.9  --  84.5  PLT 477*  --  429*   Blood Culture    Component Value Date/Time   SDES BLOOD BLOOD  RIGHT WRIST 04/01/2023 1616   SPECREQUEST  04/01/2023 1616    BOTTLES DRAWN AEROBIC AND ANAEROBIC Blood Culture adequate volume   CULT  04/01/2023 1616    NO GROWTH 5 DAYS Performed at [facility], [address] [place], [place] [address]    REPTSTATUS 04/06/2023 FINAL 04/01/2023 1616    Cardiac Enzymes: No results for input(s): "CKTOTAL", "CKMB", "CKMBINDEX", "TROPONINI" in the last 168 hours. CBG: Recent Labs  Lab 04/19/23 0731 04/19/23 1257  GLUCAP 106* 93   Iron Studies: No results for input(s): "IRON", "TIBC", "TRANSFERRIN", "FERRITIN" in the last 72 hours. Lab Results  Component Value Date   INR 1.7 (H) 04/01/2023   Studies/Results: DG Chest Port 1 View  Result Date: 04/18/2023 CLINICAL DATA:  Shortness of breath. EXAM: PORTABLE CHEST 1 VIEW COMPARISON:  04/01/2023 FINDINGS: There is marked enlargement of the cardiac silhouette which appears progressive when compared with study from 08/08/2022 and is new when compared with 06/05/2019. Imaging findings raise the concern for underlying pericardial effusion. Diminished aeration to the left base may reflect pleural effusion, atelectasis or  infiltrate. Pulmonary vascular congestion is noted elsewhere in the lungs. IMPRESSION: 1. Marked enlargement of the cardiac silhouette which appears progressive when compared with study from 08/08/2022 and is new when compared with 06/05/2019. Imaging findings raise the concern for underlying pericardial effusion. 2. Diminished aeration to the left base may reflect pleural effusion, atelectasis or infiltrate. 3. Pulmonary vascular congestion. Electronically Signed   By: Signa Kell M.D.   On: 04/18/2023 15:19    Medications:   carvedilol  25 mg Oral BID WC   Chlorhexidine Gluconate Cloth  6 each Topical Q0600   cinacalcet  30 mg Oral Daily   diclofenac Sodium  4 g Topical QID   gabapentin  100 mg Oral TID   heparin  5,000 Units Subcutaneous Q8H   insulin aspart  0-6 Units  Subcutaneous TID WC   ipratropium-albuterol  3 mL Nebulization Once   lanthanum  1,000 mg Oral TID WC   melatonin  3 mg Oral QHS   sertraline  50 mg Oral Daily    Dialysis Orders:  [facility] F180, BFR 500, 4hr 15 min, 2 ca, 2K, edw 105.3kg (recent post weight 136kg), L AVF, mircera 150mg  last given 7/2, hectorol w/ hd, sensipar 30mg  daily, lanthanum 1 tab w/ meals  Assessment/Plan: # ESRD: Profoundly volume overloaded.  Volume issues detailed below.  He will likely need serial HD for the next few days.  He signed off HD today 2nd cramping. Next HD 8/16. Using home prescription but likely shortened to 4 hours.   # Volume/ hypertension: EDW 105.3 kg but not even close in the outpatient setting.  Chronic issues with compliance.  Massively volume overloaded.  Plan for hemodialysis again tomorrow with aggressive ultrafiltration.  S/p paracentesis by IR 8/15.  Continue home blood pressure medications   # Anemia of Chronic Kidney Disease: Hemoglobin seems to be fluctuating. Has not received ESA recently.  Iron stores fairly adequate with ferritin of 941 recently hold ESA for now.  May improve with volume removal.   # Secondary Hyperparathyroidism/Hyperphosphatemia: Hold home Sensipar given lower calcium.  Hectorol 10 mcg with dialysis.  Home lanthanum.   # Vascular access: Left upper extremity AVF with no issues   # Acute hypoxic respiratory failure: Related to volume overload in the setting of noncompliance with dialysis.  Requiring dialysis and has tachypnea.  Ultrafiltration as above.   # Recurrent ascites: Mostly related to dialysis noncompliance.  S/p para in IR 8/15.  Dialysis as above.   # Enlarged cardiac silhouette: Present on chest x-ray.  Consider echocardiogram.  # Additional recommendations: - Dose all meds for creatinine clearance < 10 ml/min  - Unless absolutely necessary, no MRIs with gadolinium.  - Implement save arm precautions.  Prefer needle sticks in the  dorsum of the hands or wrists.  No blood pressure measurements in arm. - If blood transfusion is requested during hemodialysis sessions, please alert [place] prior to the session.  - Use synthetic opioids (Fentanyl/Dilaudid) if needed  Salome Holmes, NP [facility] 04/19/2023,3:48 PM  LOS: 0 days

## 2023-04-19 NOTE — Progress Notes (Signed)
HD#0 SUBJECTIVE:  Patient Summary: Bobby [name] is a 27 y.o. male with a pertinent PMH of HFrEF, ESRD on HD TTS, T1DM, and HTN presenting to the ED with worsening shortness of breath and is admitted for volume overload.   Overnight Events: Patient was taken for dialysis at 11 PM yesterday, 5 L fluid removed.  Interim History: Patient was evaluated bedside this morning.  He says his shortness of breath is improved from yesterday.  He feels better overall, but still has some shortness of breath and notes significant leg swelling and heaviness.  OBJECTIVE:  Vital Signs: Vitals:   04/19/23 0157 04/19/23 0210 04/19/23 0213 04/19/23 0213  BP:   (!) 170/116 (!) 170/116  Pulse:   (!) 131 (!) 128  Resp:      Temp: 97.6 F (36.4 C)  98.5 F (36.9 C) 98.5 F (36.9 C)  TempSrc: Oral  Oral Oral  SpO2:   99% 100%  Weight:  (!) 137.7 kg    Height:  6\' 5"  (1.956 m)     Supplemental O2: Nasal Cannula SpO2: 100 % O2 Flow Rate (L/min): 3 L/min  Filed Weights   04/19/23 0210  Weight: (!) 137.7 kg    Intake/Output Summary (Last 24 hours) at 04/19/2023 0658 Last data filed at 04/19/2023 0210 Gross per 24 hour  Intake 240 ml  Output 5000 ml  Net -4760 ml   Net IO Since Admission: -4,760 mL [04/19/23 0658]  Physical Exam:  General: No acute distress, volume overloaded Cardiac: Regular rate and rhythm, murmur present, possibly secondary to fistula. 2+ pitting edema in bilateral lower extremities Pulmonary: Regular rate, slightly increased effort and expiratory wheezing more predominant in the lung bases Abdomen: Distended, nontender to palpation ascites Neuro: No focal deficits. A&O x 4. Psych: Normal mood and affect  Patient Lines/Drains/Airways Status     Active Line/Drains/Airways     Name Placement date Placement time Site Days   Peripheral IV 04/18/23 22 G Posterior;Right Wrist 04/18/23  1444  Wrist  1   Fistula / Graft Left Upper arm Arteriovenous fistula 03/04/21  1327   Upper arm  776            Pertinent Labs:    Latest Ref Rng & Units 04/19/2023    4:48 AM 04/18/2023   11:40 PM 04/18/2023    5:00 PM  CBC  WBC 4.0 - 10.5 K/uL 6.3   6.4   Hemoglobin 13.0 - 17.0 g/dL 8.3  60.1  8.5   Hematocrit 39.0 - 52.0 % 27.8  33.0  28.1   Platelets 150 - 400 K/uL 429   477        Latest Ref Rng & Units 04/19/2023    4:48 AM 04/18/2023   11:40 PM 04/18/2023   11:32 PM  CMP  Glucose 70 - 99 mg/dL 093   85   BUN 6 - 20 mg/dL 45   44   Creatinine 2.35 - 1.24 mg/dL 5.73   2.20   Sodium 254 - 145 mmol/L 138  138  139   Potassium 3.5 - 5.1 mmol/L 4.6  4.7  4.8   Chloride 98 - 111 mmol/L 91   90   CO2 22 - 32 mmol/L 33   33   Calcium 8.9 - 10.3 mg/dL 8.7   9.0   Total Protein 6.5 - 8.1 g/dL 6.8   7.9   Total Bilirubin 0.3 - 1.2 mg/dL 1.1   1.4   Alkaline Phos 38 -  126 U/L 89   92   AST 15 - 41 U/L 21   26   ALT 0 - 44 U/L 16   16    Pertinent Imaging: DG Chest Port 1 View  Result Date: 04/18/2023 CLINICAL DATA:  Shortness of breath. EXAM: PORTABLE CHEST 1 VIEW COMPARISON:  04/01/2023 FINDINGS: There is marked enlargement of the cardiac silhouette which appears progressive when compared with study from 08/08/2022 and is new when compared with 06/05/2019. Imaging findings raise the concern for underlying pericardial effusion. Diminished aeration to the left base may reflect pleural effusion, atelectasis or infiltrate. Pulmonary vascular congestion is noted elsewhere in the lungs. IMPRESSION: 1. Marked enlargement of the cardiac silhouette which appears progressive when compared with study from 08/08/2022 and is new when compared with 06/05/2019. Imaging findings raise the concern for underlying pericardial effusion. 2. Diminished aeration to the left base may reflect pleural effusion, atelectasis or infiltrate. 3. Pulmonary vascular congestion. Electronically Signed   By: Signa Kell M.D.   On: 04/18/2023 15:19    ASSESSMENT/PLAN:  Assessment: Principal  Problem:   Volume overload  Assessment & Plan by Problem: Patient is a 27 year old male with ESRD on hemodialysis (history of nonadherence with hemodialysis sessions) and heart failure with reduced ejection fraction presenting with dyspnea and orthopnea and found to have significant volume overload, admitted for inpatient hemodialysis.  Plan: Volume overload ESRD on HD, TTS Anemia of chronic disease  HFrEF (EF 30-35%) Patient remains volume overloaded on exam with significant pitting edema bilaterally and ascites.  This is in the setting of several missed hemodialysis appointments.  Nephrology is following. He will be taken again for dialysis today and likely again over the next few days.  His shortness of breath is improving and he is on 3 L nasal cannula for the time being.  Expect this volume overload to improve as he continues to dialyze over the next several days. Hgb 8.3 this morning, stable.  - Hemodialysis today - IR consulted for paracentesis for ascites - Echocardiogram - CMP, CBC in AM - Renal diet with fluid restriction of 1.5 L  Type 1 vs Type 2 Diabetes Mellitus HgbA1c was 6.2 on 03/28/2023; may be affected by dialysis. Home regimen is NovoLog 5 units with meals.  Per chart review, it is unclear whether the patient is type 1 or type 2 diabetic.  Unable to find any endocrinology notes.  The only lab I can find in the system is a C-peptide level of 16 in 07/2021. Will order antibody labs.  - SSI with meals - anti-insulin, GAD65, IA2, and ZnT8 antibodies  Hypertension Patient is hypertensive this morning with blood pressure of 163/101.  His home medications are Coreg 25 mg twice daily and Entresto 49/51 mg daily. Entresto held until after dialysis and paracentesis for concerns of hypotension.   Best Practice: Diet: Renal diet IVF: None VTE: heparin injection 5,000 Units Start: 04/19/23 0600 Code: Full AB: None DISPO: Anticipated discharge in 2-3 days to Home pending  dialysis and medical stability.  Signature: Annett Fabian, MD  Internal Medicine Resident, PGY-1 [facility]  Pager: [phone] 6:58 AM, 04/19/2023   Please contact the on call pager after 5 pm and on weekends at [phone].

## 2023-04-19 NOTE — Progress Notes (Signed)
Pt receives out-pt HD at [facility] on TTS 11:15 chair time. Will assist as needed.   Olivia Canter Renal Navigator [phone]

## 2023-04-19 NOTE — Plan of Care (Signed)
  Problem: Education: Goal: Ability to describe self-care measures that may prevent or decrease complications (Diabetes Survival Skills Education) will improve Outcome: Progressing Goal: Individualized Educational Video(s) Outcome: Progressing   Problem: Coping: Goal: Ability to adjust to condition or change in health will improve Outcome: Progressing   Problem: Fluid Volume: Goal: Ability to maintain a balanced intake and output will improve Outcome: Progressing   Problem: Health Behavior/Discharge Planning: Goal: Ability to identify and utilize available resources and services will improve Outcome: Progressing Goal: Ability to manage health-related needs will improve Outcome: Progressing   Problem: Metabolic: Goal: Ability to maintain appropriate glucose levels will improve Outcome: Progressing   Problem: Nutritional: Goal: Maintenance of adequate nutrition will improve Outcome: Progressing Goal: Progress toward achieving an optimal weight will improve Outcome: Progressing   Problem: Skin Integrity: Goal: Risk for impaired skin integrity will decrease Outcome: Progressing   Problem: Tissue Perfusion: Goal: Adequacy of tissue perfusion will improve Outcome: Progressing   Problem: Education: Goal: Knowledge of General Education information will improve Description: Including pain rating scale, medication(s)/side effects and non-pharmacologic comfort measures Outcome: Progressing   Problem: Clinical Measurements: Goal: Ability to maintain clinical measurements within normal limits will improve Outcome: Progressing Goal: Will remain free from infection Outcome: Progressing Goal: Diagnostic test results will improve Outcome: Progressing Goal: Respiratory complications will improve Outcome: Progressing Goal: Cardiovascular complication will be avoided Outcome: Progressing   Problem: Activity: Goal: Risk for activity intolerance will decrease Outcome: Progressing    Problem: Safety: Goal: Ability to remain free from injury will improve Outcome: Progressing

## 2023-04-19 NOTE — Progress Notes (Signed)
Attempted Echocardiogram, patient went to Dialysis.

## 2023-04-19 NOTE — Progress Notes (Signed)
PT Cancellation Note  Patient Details Name: Bobby [name] MRN: [id number] DOB: [birth year]   Cancelled Treatment:    Reason Eval/Treat Not Completed: Patient at procedure or test/unavailable (Pt unavailable in HD T/TH/S. Will follow up as able and appropriate.)  Harrel Carina, DPT, CLT  Acute Rehabilitation Services Office: [phone] (Secure chat preferred)   Claudia Desanctis 04/19/2023, 11:32 AM

## 2023-04-19 NOTE — ED Notes (Signed)
ED TO INPATIENT HANDOFF REPORT  ED Nurse Name and Phone #:  Sherron Ales [phone]  S Name/Age/Gender Bobby [name] 27 y.o. male Room/Bed: 028C/028C  Code Status   Code Status: Full Code  Home/SNF/Other Home Patient oriented to: self, place, time, and situation Is this baseline? Yes   Triage Complete: Triage complete  Chief Complaint Volume overload [E87.70]  Triage Note Patient coming from home. Patient   Was supposed to get fluid tap on belly on Monday and did not get it done. Missed dialysis yesterday  Blood pressure high  Hx of congestive heart failure  Dialysis on Tuesday, Thursday and Saturday  Fluid over loaded. Arms, legs, and belly swollen  210/130 50 90% on 6L    Allergies No Known Allergies  Level of Care/Admitting Diagnosis ED Disposition     ED Disposition  Admit   Condition  --   Comment  Hospital Area: [facility] [[id number]]  Level of Care: Telemetry Medical [[id number]]  May admit patient to [facility] or [facility] if equivalent level of care is available:: No  Covid Evaluation: Asymptomatic - no recent exposure (last 10 days) testing not required  Diagnosis: Volume overload [960454]  Admitting Physician: Ginnie Smart [[id number]]  Attending Physician: Ninetta Lights, JEFFREY C [[id number]]  Certification:: I certify this patient will need inpatient services for at least 2 midnights  Expected Medical Readiness: 04/21/2023          B Medical/Surgery History Past Medical History:  Diagnosis Date   Diabetes mellitus without complication (HCC)    Type I   ESRD (end stage renal disease) (HCC)    hemodialysis initiated 02/28/21   Hypertension    Past Surgical History:  Procedure Laterality Date   AV FISTULA PLACEMENT Left 03/04/2021   Procedure: Basilic Vein Transposition Left upper arm fistula;  Surgeon: Larina Earthly, MD;  Location: [facility] OR;  Service: Vascular;  Laterality: Left;  PERIPHERAL NERVE BLOCK   IR PERC TUN PERIT CATH WO PORT S&I  /IMAG  02/28/2021   IR US GUIDE VASC ACCESS RIGHT  02/28/2021     A IV Location/Drains/Wounds Patient Lines/Drains/Airways Status     Active Line/Drains/Airways     Name Placement date Placement time Site Days   Peripheral IV 04/18/23 22 G Posterior;Right Wrist 04/18/23  1444  Wrist  1   Fistula / Graft Left Upper arm Arteriovenous fistula 03/04/21  1327  Upper arm  776            Intake/Output Last 24 hours  Intake/Output Summary (Last 24 hours) at 04/19/2023 0127 Last data filed at 04/18/2023 2130 Gross per 24 hour  Intake --  Output 5000 ml  Net -5000 ml    Labs/Imaging Results for orders placed or performed during the hospital encounter of 04/18/23 (from the past 48 hour(s))  Troponin I (High Sensitivity)     Status: Abnormal   Collection Time: 04/18/23  1:51 PM  Result Value Ref Range   Troponin I (High Sensitivity) 91 (H) <18 ng/L    Comment: (NOTE) Elevated high sensitivity troponin I (hsTnI) values and significant  changes across serial measurements may suggest ACS but many other  chronic and acute conditions are known to elevate hsTnI results.  Refer to the "Links" section for chest pain algorithms and additional  guidance. Performed at [facility], [address] [place], [place] [address]   Brain natriuretic peptide     Status: Abnormal   Collection Time: 04/18/23  5:00 PM  Result Value Ref Range   B Natriuretic Peptide 3,420.0 (H) 0.0 - 100.0 pg/mL    Comment: Performed at [facility], [address] [place], [place] [address]  Troponin I (High Sensitivity)     Status: Abnormal   Collection Time: 04/18/23  5:00 PM  Result Value Ref Range   Troponin I (High Sensitivity) 92 (H) <18 ng/L    Comment: (NOTE) Elevated high sensitivity troponin I (hsTnI) values and significant  changes across serial measurements may suggest ACS but many other  chronic and acute conditions are known to elevate hsTnI results.  Refer to the "Links" section for  chest pain algorithms and additional  guidance. Performed at [facility], [address], [place], [place] [address]   CBC with Differential/Platelet     Status: Abnormal   Collection Time: 04/18/23  5:00 PM  Result Value Ref Range   WBC 6.4 4.0 - 10.5 K/uL   RBC 3.27 (L) 4.22 - 5.81 MIL/uL   Hemoglobin 8.5 (L) 13.0 - 17.0 g/dL   HCT 29.5 (L) 28.4 - 13.2 %   MCV 85.9 80.0 - 100.0 fL   MCH 26.0 26.0 - 34.0 pg   MCHC 30.2 30.0 - 36.0 g/dL   RDW 44.0 (H) 10.2 - 72.5 %   Platelets 477 (H) 150 - 400 K/uL   nRBC 0.0 0.0 - 0.2 %   Neutrophils Relative % 70 %   Neutro Abs 4.5 1.7 - 7.7 K/uL   Lymphocytes Relative 12 %   Lymphs Abs 0.8 0.7 - 4.0 K/uL   Monocytes Relative 12 %   Monocytes Absolute 0.8 0.1 - 1.0 K/uL   Eosinophils Relative 5 %   Eosinophils Absolute 0.4 0.0 - 0.5 K/uL   Basophils Relative 0 %   Basophils Absolute 0.0 0.0 - 0.1 K/uL   Immature Granulocytes 1 %   Abs Immature Granulocytes 0.03 0.00 - 0.07 K/uL    Comment: Performed at [facility], [address], [place], [place] [address]  Hepatitis B surface antigen     Status: None   Collection Time: 04/18/23 11:32 PM  Result Value Ref Range   Hepatitis B Surface Ag NON REACTIVE NON REACTIVE    Comment: Performed at [facility], [address], [place], [place] [address]  Comprehensive metabolic panel     Status: Abnormal   Collection Time: 04/18/23 11:32 PM  Result Value Ref Range   Sodium 139 135 - 145 mmol/L   Potassium 4.8 3.5 - 5.1 mmol/L   Chloride 90 (L) 98 - 111 mmol/L   CO2 33 (H) 22 - 32 mmol/L   Glucose, Bld 85 70 - 99 mg/dL    Comment: Glucose reference range applies only to samples taken after fasting for at least 8 hours.   BUN 44 (H) 6 - 20 mg/dL   Creatinine, Ser 2.59 (H) 0.61 - 1.24 mg/dL   Calcium 9.0 8.9 - 56.3 mg/dL   Total Protein 7.9 6.5 - 8.1 g/dL   Albumin 1.9 (L) 3.5 - 5.0 g/dL   AST 26 15 - 41 U/L   ALT 16 0 - 44 U/L   Alkaline Phosphatase 92 38 - 126 U/L    Total Bilirubin 1.4 (H) 0.3 - 1.2 mg/dL   GFR, Estimated 8 (L) >60 mL/min    Comment: (NOTE) Calculated using the CKD-EPI Creatinine Equation (2021)    Anion gap 16 (H) 5 - 15    Comment: Performed at [employer]  [facility], [address] [place], [place] [address]  I-Stat venous blood gas, [facility], [facility], [facility])     Status: Abnormal   Collection Time: 04/18/23 11:40 PM  Result Value Ref Range   pH, Ven 7.515 (H) 7.25 - 7.43   pCO2, Ven 52.2 44 - 60 mmHg   pO2, Ven 23 (LL) 32 - 45 mmHg   Bicarbonate 42.1 (H) 20.0 - 28.0 mmol/L   TCO2 44 (H) 22 - 32 mmol/L   O2 Saturation 46 %   Acid-Base Excess 17.0 (H) 0.0 - 2.0 mmol/L   Sodium 138 135 - 145 mmol/L   Potassium 4.7 3.5 - 5.1 mmol/L   Calcium, Ion 1.04 (L) 1.15 - 1.40 mmol/L   HCT 33.0 (L) 39.0 - 52.0 %   Hemoglobin 11.2 (L) 13.0 - 17.0 g/dL   Sample type VENOUS    Comment NOTIFIED PHYSICIAN    DG Chest Port 1 View  Result Date: 04/18/2023 CLINICAL DATA:  Shortness of breath. EXAM: PORTABLE CHEST 1 VIEW COMPARISON:  04/01/2023 FINDINGS: There is marked enlargement of the cardiac silhouette which appears progressive when compared with study from 08/08/2022 and is new when compared with 06/05/2019. Imaging findings raise the concern for underlying pericardial effusion. Diminished aeration to the left base may reflect pleural effusion, atelectasis or infiltrate. Pulmonary vascular congestion is noted elsewhere in the lungs. IMPRESSION: 1. Marked enlargement of the cardiac silhouette which appears progressive when compared with study from 08/08/2022 and is new when compared with 06/05/2019. Imaging findings raise the concern for underlying pericardial effusion. 2. Diminished aeration to the left base may reflect pleural effusion, atelectasis or infiltrate. 3. Pulmonary vascular congestion. Electronically Signed   By: Signa Kell M.D.   On: 04/18/2023 15:19    Pending Labs Unresulted Labs (From admission, onward)     Start     Ordered    04/19/23 0500  CBC  Tomorrow morning,   R        04/19/23 0109   04/19/23 0500  Comprehensive metabolic panel  Tomorrow morning,   R        04/19/23 0109   04/19/23 0107  Creatinine, serum  (heparin)  Once,   R       Comments: Baseline for heparin therapy IF NOT ALREADY DRAWN.    04/19/23 0109   04/18/23 1700  Hepatitis B surface antibody,quantitative  Once,   AD        04/18/23 1700   04/18/23 1352  SARS Coronavirus 2 by RT PCR (hospital order, performed in [facility] hospital lab) *cepheid single result test* Anterior Nasal Swab  (SARS Coronavirus 2 by RT PCR (hospital order, performed in [facility] hospital lab) *cepheid single result test*)  Once,   URGENT        04/18/23 1351   04/18/23 1351  CBC with Differential  Once,   STAT        04/18/23 1351            Vitals/Pain Today's Vitals   04/18/23 2045 04/18/23 2130 04/19/23 0016 04/19/23 0100  BP:   (!) 175/97 (!) 167/105  Pulse: (!) 119 (!) 119 (!) 119   Resp:   16   Temp:  97.7 F (36.5 C)    TempSrc:  Oral    SpO2:   97%   PainSc:        Isolation Precautions No active isolations  Medications Medications  ipratropium-albuterol (DUONEB) 0.5-2.5 (3) MG/3ML nebulizer solution 3 mL (has  no administration in time range)  Chlorhexidine Gluconate Cloth 2 % PADS 6 each (has no administration in time range)  heparin injection 5,000 Units (has no administration in time range)  acetaminophen (TYLENOL) tablet 650 mg (has no administration in time range)    Or  acetaminophen (TYLENOL) suppository 650 mg (has no administration in time range)  senna-docusate (Senokot-S) tablet 1 tablet (has no administration in time range)  heparin sodium (porcine) injection 2,000 Units (2,000 Units Intravenous Given 04/18/23 1858)  heparin sodium (porcine) injection 2,000 Units (2,000 Units Intravenous Given 04/18/23 1727)    Mobility walks       R Recommendations: See Admitting Provider Note  Report given to:   Additional  Notes:  Pt is A&Ox4.

## 2023-04-19 NOTE — Progress Notes (Signed)
   04/19/23 1232  Vitals  Temp 98.3 F (36.8 C)  Temp Source Oral  BP (!) 160/100  BP Location Right Arm  BP Method Automatic  Patient Position (if appropriate) Lying  Pulse Rate (!) 115  Resp 18  Oxygen Therapy  SpO2 96 %  O2 Device Room Air  During Treatment Monitoring  Intra-Hemodialysis Comments See progress note  Post Treatment  Dialyzer Clearance Lightly streaked  Hemodialysis Intake (mL) 2 mL  Liters Processed 50  Fluid Removed (mL) 1700 mL  Tolerated HD Treatment No (Comment)  Post-Hemodialysis Comments Pt cut off 2 hrs d/t bad cramping, Dr. Valentino Nose notified AMA form signed.  AVG/AVF Arterial Site Held (minutes) 10 minutes  AVG/AVF Venous Site Held (minutes) 10 minutes  Fistula / Graft Left Upper arm Arteriovenous fistula  Placement Date/Time: 03/04/21 1327   Placed prior to admission: No  Orientation: Left  Access Location: Upper arm  Access Type: (c) Arteriovenous fistula  Site Condition No complications  Fistula / Graft Assessment Present;Thrill;Bruit  Status Deaccessed  Needle Size 15  Drainage Description None

## 2023-04-20 ENCOUNTER — Inpatient Hospital Stay (HOSPITAL_COMMUNITY): Payer: Medicaid Other

## 2023-04-20 DIAGNOSIS — I3139 Other pericardial effusion (noninflammatory): Secondary | ICD-10-CM

## 2023-04-20 DIAGNOSIS — Z992 Dependence on renal dialysis: Secondary | ICD-10-CM | POA: Diagnosis not present

## 2023-04-20 DIAGNOSIS — E8770 Fluid overload, unspecified: Secondary | ICD-10-CM | POA: Diagnosis not present

## 2023-04-20 DIAGNOSIS — N186 End stage renal disease: Secondary | ICD-10-CM | POA: Diagnosis not present

## 2023-04-20 LAB — CBC
HCT: 27.9 % — ABNORMAL LOW (ref 39.0–52.0)
HCT: 29.8 % — ABNORMAL LOW (ref 39.0–52.0)
Hemoglobin: 8.3 g/dL — ABNORMAL LOW (ref 13.0–17.0)
Hemoglobin: 8.8 g/dL — ABNORMAL LOW (ref 13.0–17.0)
MCH: 25.6 pg — ABNORMAL LOW (ref 26.0–34.0)
MCH: 24.9 pg — ABNORMAL LOW (ref 26.0–34.0)
MCHC: 29.7 g/dL — ABNORMAL LOW (ref 30.0–36.0)
MCHC: 29.5 g/dL — ABNORMAL LOW (ref 30.0–36.0)
MCV: 86.1 fL (ref 80.0–100.0)
MCV: 84.4 fL (ref 80.0–100.0)
Platelets: 392 10*3/uL (ref 150–400)
Platelets: 388 10*3/uL (ref 150–400)
RBC: 3.24 MIL/uL — ABNORMAL LOW (ref 4.22–5.81)
RBC: 3.53 MIL/uL — ABNORMAL LOW (ref 4.22–5.81)
RDW: 15.6 % — ABNORMAL HIGH (ref 11.5–15.5)
RDW: 15.5 % (ref 11.5–15.5)
WBC: 5.4 10*3/uL (ref 4.0–10.5)
WBC: 5.1 10*3/uL (ref 4.0–10.5)
nRBC: 0 % (ref 0.0–0.2)
nRBC: 0 % (ref 0.0–0.2)

## 2023-04-20 LAB — COMPREHENSIVE METABOLIC PANEL
ALT: 13 U/L (ref 0–44)
AST: 19 U/L (ref 15–41)
Albumin: 1.5 g/dL — ABNORMAL LOW (ref 3.5–5.0)
Alkaline Phosphatase: 85 U/L (ref 38–126)
Anion gap: 13 (ref 5–15)
BUN: 43 mg/dL — ABNORMAL HIGH (ref 6–20)
CO2: 32 mmol/L (ref 22–32)
Calcium: 8 mg/dL — ABNORMAL LOW (ref 8.9–10.3)
Chloride: 90 mmol/L — ABNORMAL LOW (ref 98–111)
Creatinine, Ser: 8.75 mg/dL — ABNORMAL HIGH (ref 0.61–1.24)
GFR, Estimated: 8 mL/min — ABNORMAL LOW (ref >60)
Glucose, Bld: 120 mg/dL — ABNORMAL HIGH (ref 70–99)
Potassium: 4.3 mmol/L (ref 3.5–5.1)
Sodium: 135 mmol/L (ref 135–145)
Total Bilirubin: 1.2 mg/dL (ref 0.3–1.2)
Total Protein: 6.3 g/dL — ABNORMAL LOW (ref 6.5–8.1)

## 2023-04-20 LAB — ECHOCARDIOGRAM LIMITED
Est EF: 50
Height: 77 in
S' Lateral: 3.7 cm
Weight: 4571.46 [oz_av]

## 2023-04-20 LAB — GLUCOSE, CAPILLARY
Glucose-Capillary: 117 mg/dL — ABNORMAL HIGH (ref 70–99)
Glucose-Capillary: 125 mg/dL — ABNORMAL HIGH (ref 70–99)
Glucose-Capillary: 137 mg/dL — ABNORMAL HIGH (ref 70–99)

## 2023-04-20 MED ORDER — HEPARIN SODIUM (PORCINE) 1000 UNIT/ML DIALYSIS: 1000.0000 [IU] | INTRAMUSCULAR | Status: DC | PRN

## 2023-04-20 MED ORDER — LIDOCAINE HCL (PF) 1 % IJ SOLN: 5.0000 mL | INTRAMUSCULAR | Status: DC | PRN

## 2023-04-20 MED ORDER — HEPARIN BOLUS VIA INFUSION: 2000.0000 [IU] | Freq: Once | INTRAVENOUS | Status: DC

## 2023-04-20 MED ORDER — LIDOCAINE-PRILOCAINE 2.5-2.5 % EX CREA: 1.0000 | TOPICAL_CREAM | CUTANEOUS | Status: DC | PRN

## 2023-04-20 MED ORDER — ALTEPLASE 2 MG IJ SOLR: 2.0000 mg | Freq: Once | INTRAMUSCULAR | Status: DC | PRN

## 2023-04-20 MED ORDER — PENTAFLUOROPROP-TETRAFLUOROETH EX AERO: 1.0000 | INHALATION_SPRAY | CUTANEOUS | Status: DC | PRN

## 2023-04-20 MED ORDER — HEPARIN SODIUM (PORCINE) 1000 UNIT/ML IJ SOLN: 2000.0000 [IU] | Freq: Once | INTRAMUSCULAR | Status: DC

## 2023-04-20 NOTE — Plan of Care (Signed)
  Problem: Coping: Goal: Ability to adjust to condition or change in health will improve Outcome: Progressing   Problem: Fluid Volume: Goal: Ability to maintain a balanced intake and output will improve Outcome: Progressing   Problem: Health Behavior/Discharge Planning: Goal: Ability to identify and utilize available resources and services will improve Outcome: Progressing Goal: Ability to manage health-related needs will improve Outcome: Progressing   

## 2023-04-20 NOTE — Progress Notes (Signed)
PT Cancellation Note  Patient Details Name: Bobby [name] MRN: [id number] DOB: October 23, 1995   Cancelled Treatment:    Reason Eval/Treat Not Completed: Fatigue/lethargy limiting ability to participate. Pt refuses mobility due to fatigue after dialysis, requests PT return tomorrow. PT will follow up as time allows.   Arlyss Gandy 04/20/2023, 2:25 PM

## 2023-04-20 NOTE — Procedures (Signed)
I was present at this dialysis session. I have reviewed the session itself and made appropriate changes.   Filed Weights   04/19/23 1005 04/19/23 1236 04/20/23 0804  Weight: (!) 138.2 kg (!) 136.5 kg 132.3 kg    Recent Labs  Lab 04/20/23 0343  NA 135  K 4.3  CL 90*  CO2 32  GLUCOSE 120*  BUN 43*  CREATININE 8.75*  CALCIUM 8.0*    Recent Labs  Lab 04/18/23 1700 04/18/23 2340 04/19/23 0448 04/20/23 0343  WBC 6.4  --  6.3 5.4  NEUTROABS 4.5  --   --   --   HGB 8.5* 11.2* 8.3* 8.3*  HCT 28.1* 33.0* 27.8* 27.9*  MCV 85.9  --  84.5 86.1  PLT 477*  --  429* 392    Scheduled Meds:  carvedilol  25 mg Oral BID WC   Chlorhexidine Gluconate Cloth  6 each Topical Q0600   cinacalcet  30 mg Oral Daily   diclofenac Sodium  4 g Topical QID   gabapentin  100 mg Oral TID   heparin  5,000 Units Subcutaneous Q8H   heparin sodium (porcine)  2,000 Units Intracatheter Once   insulin aspart  0-6 Units Subcutaneous TID WC   ipratropium-albuterol  3 mL Nebulization Once   lanthanum  1,000 mg Oral TID WC   melatonin  3 mg Oral QHS   sacubitril-valsartan  1 tablet Oral BID   sertraline  50 mg Oral Daily   Continuous Infusions: PRN Meds:.acetaminophen **OR** acetaminophen, alteplase, cyclobenzaprine, heparin, lidocaine (PF), lidocaine-prilocaine, pentafluoroprop-tetrafluoroeth, senna-docusate   Louie Bun,  MD 04/20/2023, 11:37 AM

## 2023-04-20 NOTE — Progress Notes (Signed)
Echocardiogram 2D Echocardiogram has been performed.  Hardie Veltre N Rana Adorno,RDCS 04/20/2023, 3:58 PM

## 2023-04-20 NOTE — Progress Notes (Signed)
[facility] Progress Note   Subjective:    Seen in KDU. UF goal 4L. Tolerating so far, no cramps.  No new complaints this am.   Objective Vitals:   04/20/23 0930 04/20/23 1000 04/20/23 1030 04/20/23 1100  BP: 127/75 125/74 122/75 128/86  Pulse: 95 95 95 96  Resp: (!) 28 (!) 31 (!) 30 (!) 32  Temp:      TempSrc:      SpO2: 100% 100% 100% 100%  Weight:      Height:       Physical Exam General: Awake, alert, NAD Heart: S1 and S2; No MRGs Lungs: Clear anteriorly, fine rales bilateral lower lobes Abdomen: Large and distended Extremities:2-3+ edema bilateral lower extremities Dialysis Access: L AVF (+) B/T   Filed Weights   04/19/23 1005 04/19/23 1236 04/20/23 0804  Weight: (!) 138.2 kg (!) 136.5 kg 132.3 kg    Intake/Output Summary (Last 24 hours) at 04/20/2023 1133 Last data filed at 04/20/2023 0512 Gross per 24 hour  Intake 240 ml  Output 1700 ml  Net -1460 ml    Additional Objective Labs: Basic Metabolic Panel: Recent Labs  Lab 04/18/23 2332 04/18/23 2340 04/19/23 0448 04/20/23 0343  NA 139 138 138 135  K 4.8 4.7 4.6 4.3  CL 90*  --  91* 90*  CO2 33*  --  33* 32  GLUCOSE 85  --  136* 120*  BUN 44*  --  45* 43*  CREATININE 8.90*  --  9.28* 8.75*  CALCIUM 9.0  --  8.7* 8.0*   Liver Function Tests: Recent Labs  Lab 04/18/23 2332 04/19/23 0448 04/20/23 0343  AST 26 21 19   ALT 16 16 13   ALKPHOS 92 89 85  BILITOT 1.4* 1.1 1.2  PROT 7.9 6.8 6.3*  ALBUMIN 1.9* 1.7* 1.5*   No results for input(s): "LIPASE", "AMYLASE" in the last 168 hours. CBC: Recent Labs  Lab 04/18/23 1700 04/18/23 2340 04/19/23 0448 04/20/23 0343  WBC 6.4  --  6.3 5.4  NEUTROABS 4.5  --   --   --   HGB 8.5* 11.2* 8.3* 8.3*  HCT 28.1* 33.0* 27.8* 27.9*  MCV 85.9  --  84.5 86.1  PLT 477*  --  429* 392   Blood Culture    Component Value Date/Time   SDES BLOOD BLOOD RIGHT WRIST 04/01/2023 1616   SPECREQUEST  04/01/2023 1616    BOTTLES DRAWN AEROBIC AND  ANAEROBIC Blood Culture adequate volume   CULT  04/01/2023 1616    NO GROWTH 5 DAYS Performed at [facility], [address], [place], [place] [address]    REPTSTATUS 04/06/2023 FINAL 04/01/2023 1616    Cardiac Enzymes: No results for input(s): "CKTOTAL", "CKMB", "CKMBINDEX", "TROPONINI" in the last 168 hours. CBG: Recent Labs  Lab 04/19/23 0731 04/19/23 1257 04/19/23 1626 04/19/23 2021 04/20/23 0714  GLUCAP 106* 93 132* 100* 117*   Iron Studies: No results for input(s): "IRON", "TIBC", "TRANSFERRIN", "FERRITIN" in the last 72 hours. Lab Results  Component Value Date   INR 1.7 (H) 04/01/2023   Studies/Results: IR Paracentesis  Result Date: 04/19/2023 INDICATION: Congestive heart failure and end-stage renal disease on hemodialysis with ascites. Request for paracentesis. EXAM: ULTRASOUND GUIDED PARACENTESIS MEDICATIONS: 1% lidocaine 10 mL COMPLICATIONS: None immediate. PROCEDURE: Informed written consent was obtained from the patient after a discussion of the risks, benefits and alternatives to treatment. A timeout was performed prior to the initiation of the procedure. Initial ultrasound scanning demonstrates a large  amount of ascites within the right lower abdominal quadrant. The right lower abdomen was prepped and draped in the usual sterile fashion. 1% lidocaine was used for local anesthesia. Following this, a 19 gauge, 7-cm, Yueh catheter was introduced. An ultrasound image was saved for documentation purposes. The paracentesis was performed. The catheter was removed and a dressing was applied. The patient tolerated the procedure well without immediate post procedural complication. FINDINGS: A total of approximately 4 L of clear yellow fluid was removed. IMPRESSION: Successful ultrasound-guided paracentesis yielding 4 liters of peritoneal fluid. Procedure performed by: Alwyn Ren, NP Electronically Signed   By: Acquanetta Belling M.D.   On: 04/19/2023 16:15   DG Chest Port 1  View  Result Date: 04/18/2023 CLINICAL DATA:  Shortness of breath. EXAM: PORTABLE CHEST 1 VIEW COMPARISON:  04/01/2023 FINDINGS: There is marked enlargement of the cardiac silhouette which appears progressive when compared with study from 08/08/2022 and is new when compared with 06/05/2019. Imaging findings raise the concern for underlying pericardial effusion. Diminished aeration to the left base may reflect pleural effusion, atelectasis or infiltrate. Pulmonary vascular congestion is noted elsewhere in the lungs. IMPRESSION: 1. Marked enlargement of the cardiac silhouette which appears progressive when compared with study from 08/08/2022 and is new when compared with 06/05/2019. Imaging findings raise the concern for underlying pericardial effusion. 2. Diminished aeration to the left base may reflect pleural effusion, atelectasis or infiltrate. 3. Pulmonary vascular congestion. Electronically Signed   By: Signa Kell M.D.   On: 04/18/2023 15:19    Medications:   carvedilol  25 mg Oral BID WC   Chlorhexidine Gluconate Cloth  6 each Topical Q0600   cinacalcet  30 mg Oral Daily   diclofenac Sodium  4 g Topical QID   gabapentin  100 mg Oral TID   heparin  5,000 Units Subcutaneous Q8H   heparin sodium (porcine)  2,000 Units Intracatheter Once   insulin aspart  0-6 Units Subcutaneous TID WC   ipratropium-albuterol  3 mL Nebulization Once   lanthanum  1,000 mg Oral TID WC   melatonin  3 mg Oral QHS   sacubitril-valsartan  1 tablet Oral BID   sertraline  50 mg Oral Daily    Dialysis Orders:  [facility] F180, BFR 500, 4hr 15 min, 2 ca, 2K, edw 105.3kg (recent post weight 136kg), L AVF, mircera 150mg  last given 7/2, hectorol w/ hd, sensipar 30mg  daily, lanthanum 1 tab w/ meals  Assessment/Plan: # ESRD: Profoundly volume overloaded.  Volume issues detailed below.  He will likely need serial HD for the next few days.Using home prescription but likely shortened to 4 hours.Continue serial  HD. HD tomorrow.    # Volume/ hypertension: EDW 105.3 kg but not even close in the outpatient setting.  Chronic issues with compliance.  Massively volume overloaded.  Plan for hemodialysis again tomorrow with aggressive ultrafiltration.  S/p paracentesis by IR 8/15.  Continue home blood pressure medications   # Anemia of Chronic Kidney Disease: Hemoglobin seems to be fluctuating. Has not received ESA recently.  Iron stores fairly adequate with ferritin of 941 recently hold ESA for now.  May improve with volume removal.   # Secondary Hyperparathyroidism/Hyperphosphatemia: Hold home Sensipar given lower calcium.  Hectorol 10 mcg with dialysis.  Home lanthanum.   # Vascular access: Left upper extremity AVF with no issues   # Acute hypoxic respiratory failure: Related to volume overload in the setting of noncompliance with dialysis.  Requiring dialysis and has tachypnea.  Ultrafiltration as above.   # Recurrent ascites: Mostly related to dialysis noncompliance.  S/p para in IR 8/15.  Dialysis as above.   # Enlarged cardiac silhouette: Present on chest x-ray.  Consider echocardiogram.  # Additional recommendations: - Dose all meds for creatinine clearance < 10 ml/min  - Unless absolutely necessary, no MRIs with gadolinium.  - Implement save arm precautions.  Prefer needle sticks in the dorsum of the hands or wrists.  No blood pressure measurements in arm. - If blood transfusion is requested during hemodialysis sessions, please alert [place] prior to the session.  - Use synthetic opioids (Fentanyl/Dilaudid) if needed  Tomasa Blase PA-C [facility] 04/20/2023,11:34 AM

## 2023-04-20 NOTE — Progress Notes (Signed)
Received patient in bed.Awake,alert and oriented x 4.Consent verified.  Access used : Left upper arm AVF that worked well.  Duration treatment: 4 hours.  Fluid removed  Met 4 liters uf goal  Hemo comment: Tolerated treatment.  Hand off to the patient's nurse.

## 2023-04-20 NOTE — Progress Notes (Signed)
OT Cancellation Note  Patient Details Name: Bobby [name] MRN: [id number] DOB: 1996-07-22   Cancelled Treatment:    Reason Eval/Treat Not Completed: Patient at procedure or test/ unavailable: Pt at HD.    Theodoro Clock 04/20/2023, 9:11 AM

## 2023-04-20 NOTE — Progress Notes (Signed)
   Subjective: 27 year old male who presented to the ED due to shortness of breath and chest pain was likely due to missing dialysis and found to have volume overload in the setting of acute on chronic heart failure.  Overnight Event: Deferred dialysis due to cramping other than that no major overnight events  Objective:  Vital signs in last 24 hours: Vitals:   04/19/23 1627 04/19/23 2022 04/20/23 0425 04/20/23 0428  BP: (!) 170/100 (!) 147/100 (!) 153/95 (!) 153/95  Pulse:  (!) 111 93 93  Resp: 18 18    Temp: 98.7 F (37.1 C) 98.7 F (37.1 C) 98.1 F (36.7 C) 98.1 F (36.7 C)  TempSrc: Oral Oral Oral Oral  SpO2: 96% 92% 100% 100%  Weight:      Height:       General: Laying comfortably in bed at the dialysis unit, on HD Cardiology: Mild crackles noted on lung auscultation Psych:Normal  Mood and affect. Abdomen:Less  Distended compared to yesterday MSK: +1 pitting edema lower extremities bilaterally  Assessment/Plan:  Principal Problem:   Volume overload  #Acute hypoxemic respiratory failure 2/2 to HF exacerbation and volume overload #ESRD #Recurrent Ascites Patient has a significant history of end-stage renal disease currently being managed with hemodialysis on Tuesdays Thursdays and Saturdays.  He presented a couple days ago due to shortness of breath and chest pain found to have volume overload in the setting of heart failure exacerbation.  He also concerns of recurrent ascites which is most likely due to noncompliance on dialysis.  IR is on board for frequent paracentesis as needed. Patient could not have her scheduled dialysis yesterday due to cramping.  Plan is to dialyze today with aggressive ultrafiltration.  Total net of 6.7L fluid out during this admission so far. Nephrology is following and their recommendation is greatly appreciated. -HD today -Continue fluid restriction  #Hypertension Hypertension currently being managed with Entresto and Coreg.  Pressure today  is 153/95.  Systolic went up from 147-153 today even though patient received his Entresto yesterday.  Wondering if this is due to not having dialysis yesterday.  I will recheck his blood pressure after HD today .Continue on his current  antihypertensives  # Concerns for diabetes of unspecified type Patient reported history of diabetes but unsure if type I or type II at this time.  Last A1c was 6.2 and glucose today is 117. Patient is however on no home regimen for glycemic control.Anti-insulin, GAD65, IA2, and ZnT8 antibodies sent, will follow-up with these results and drop on appropriate glycemic control regimen.  Continue sliding scale insulin with meals.  Prior to Admission Living Arrangement: Anticipated Discharge Location: Barriers to Discharge: Dispo: Anticipated discharge in approximately 2 day(s).   Kathleen Lime, MD 04/20/2023, 5:41 AM After 5pm on weekdays and 1pm on weekends: On Call pager [phone]

## 2023-04-21 DIAGNOSIS — R601 Generalized edema: Secondary | ICD-10-CM | POA: Insufficient documentation

## 2023-04-21 DIAGNOSIS — N186 End stage renal disease: Secondary | ICD-10-CM | POA: Diagnosis not present

## 2023-04-21 DIAGNOSIS — J9601 Acute respiratory failure with hypoxia: Secondary | ICD-10-CM | POA: Diagnosis not present

## 2023-04-21 DIAGNOSIS — E8770 Fluid overload, unspecified: Secondary | ICD-10-CM | POA: Diagnosis not present

## 2023-04-21 DIAGNOSIS — R188 Other ascites: Secondary | ICD-10-CM | POA: Diagnosis present

## 2023-04-21 DIAGNOSIS — Z992 Dependence on renal dialysis: Secondary | ICD-10-CM | POA: Diagnosis not present

## 2023-04-21 DIAGNOSIS — R161 Splenomegaly, not elsewhere classified: Secondary | ICD-10-CM | POA: Diagnosis present

## 2023-04-21 DIAGNOSIS — R160 Hepatomegaly, not elsewhere classified: Secondary | ICD-10-CM | POA: Diagnosis present

## 2023-04-21 DIAGNOSIS — I509 Heart failure, unspecified: Secondary | ICD-10-CM

## 2023-04-21 LAB — CBC WITH DIFFERENTIAL/PLATELET
Abs Immature Granulocytes: 0.03 10*3/uL (ref 0.00–0.07)
Basophils Absolute: 0 10*3/uL (ref 0.0–0.1)
Basophils Relative: 0 %
Eosinophils Absolute: 0.3 10*3/uL (ref 0.0–0.5)
Eosinophils Relative: 5 %
HCT: 26.2 % — ABNORMAL LOW (ref 39.0–52.0)
Hemoglobin: 7.9 g/dL — ABNORMAL LOW (ref 13.0–17.0)
Immature Granulocytes: 1 %
Lymphocytes Relative: 15 %
Lymphs Abs: 0.9 10*3/uL (ref 0.7–4.0)
MCH: 25.1 pg — ABNORMAL LOW (ref 26.0–34.0)
MCHC: 30.2 g/dL (ref 30.0–36.0)
MCV: 83.2 fL (ref 80.0–100.0)
Monocytes Absolute: 0.7 10*3/uL (ref 0.1–1.0)
Monocytes Relative: 11 %
Neutro Abs: 4 10*3/uL (ref 1.7–7.7)
Neutrophils Relative %: 68 %
Platelets: 370 10*3/uL (ref 150–400)
RBC: 3.15 MIL/uL — ABNORMAL LOW (ref 4.22–5.81)
RDW: 15.7 % — ABNORMAL HIGH (ref 11.5–15.5)
WBC: 5.9 10*3/uL (ref 4.0–10.5)
nRBC: 0 % (ref 0.0–0.2)

## 2023-04-21 LAB — GLUCOSE, CAPILLARY
Glucose-Capillary: 110 mg/dL — ABNORMAL HIGH (ref 70–99)
Glucose-Capillary: 100 mg/dL — ABNORMAL HIGH (ref 70–99)
Glucose-Capillary: 153 mg/dL — ABNORMAL HIGH (ref 70–99)

## 2023-04-21 LAB — RENAL FUNCTION PANEL
Albumin: <1.5 g/dL — ABNORMAL LOW (ref 3.5–5.0)
Anion gap: 12 (ref 5–15)
BUN: 33 mg/dL — ABNORMAL HIGH (ref 6–20)
CO2: 33 mmol/L — ABNORMAL HIGH (ref 22–32)
Calcium: 7.9 mg/dL — ABNORMAL LOW (ref 8.9–10.3)
Chloride: 91 mmol/L — ABNORMAL LOW (ref 98–111)
Creatinine, Ser: 6.86 mg/dL — ABNORMAL HIGH (ref 0.61–1.24)
GFR, Estimated: 10 mL/min — ABNORMAL LOW (ref >60)
Glucose, Bld: 133 mg/dL — ABNORMAL HIGH (ref 70–99)
Phosphorus: 5.8 mg/dL — ABNORMAL HIGH (ref 2.5–4.6)
Potassium: 4 mmol/L (ref 3.5–5.1)
Sodium: 136 mmol/L (ref 135–145)

## 2023-04-21 MED ORDER — LOPERAMIDE HCL 2 MG PO CAPS
4.0000 mg | ORAL_CAPSULE | ORAL | Status: DC | PRN
  Administered 2023-04-21 – 2023-04-24 (×3): 4 mg via ORAL
  Filled 2023-04-21 (×5): qty 2

## 2023-04-21 MED ORDER — DARBEPOETIN ALFA 150 MCG/0.3ML IJ SOSY
150.0000 ug | PREFILLED_SYRINGE | Freq: Once | INTRAMUSCULAR | Status: AC
  Administered 2023-04-21: 150 ug via SUBCUTANEOUS
  Filled 2023-04-21: qty 0.3

## 2023-04-21 NOTE — Progress Notes (Signed)
   04/21/23 1502  Vitals  Temp 97.8 F (36.6 C)  Pulse Rate (!) 102  Resp (!) 22  BP (!) 154/99  SpO2 98 %  O2 Device Nasal Cannula  Weight (S)  125.3 kg  Type of Weight Post-Dialysis  Oxygen Therapy  O2 Flow Rate (L/min) 2 L/min  Patient Activity (if Appropriate) In bed  Pulse Oximetry Type Continuous  Post Treatment  Dialyzer Clearance Clear  Hemodialysis Intake (mL) 0 mL  Liters Processed 96  Fluid Removed (mL) 4000 mL  Tolerated HD Treatment Yes  Post-Hemodialysis Comments Pt. tolerated procedure well and no difficulties noted. Pt. C/O of loose bowels, Admin medication Immodium per order, VSS and report called to 18M bedisde RN Cecile Hearing RN.  AVG/AVF Arterial Site Held (minutes) 10 minutes  AVG/AVF Venous Site Held (minutes) 5 minutes   Received patient in bed to unit.  Alert and oriented.  Informed consent signed and in chart.   TX duration: 4  Patient tolerated well.  Transported back to the room  Alert, without acute distress.  Hand-off given to patient's nurse.   Access used: Yes Access issues: No  Total UF removed: 4000 Medication(s) given: See MAR Post HD VS: See Above Grid Post HD weight: 125.3 kg  [facility]

## 2023-04-21 NOTE — Progress Notes (Signed)
OT Cancellation Note  Patient Details Name: Bobby [name] MRN: [id number] DOB: Jun 25, 1996   Cancelled Treatment:    Reason Eval/Treat Not Completed: Patient at procedure or test/ unavailable (Attmpeted to see pt for skilled OT eval. However, pt is off unit for HD. OT to reattempt to see pt at a later time as appropriate/available.)  [name] "Ronaldo Miyamoto" M., OTR/L, MA Acute Rehab [phone]   [name] 04/21/2023, 11:33 AM

## 2023-04-21 NOTE — Evaluation (Signed)
Physical Therapy Evaluation & Discharge Patient Details Name: Bobby [name] MRN: [id number] DOB: November 27, 1995 Today's Date: 04/21/2023  History of Present Illness  Pt is 27 y.o. male admitted 04/18/2023 with worsening SOB. Workup for volume overload secondary to acute on chronic HF. Of note, recent admissions to [facility] 03/2023, then to [facility] 04/01/23 with similar issue missing HD appt. PMH includes ESRD (HD TTS), HFrEF, type 1 diabetes, HTN, obesity.   Clinical Impression  Patient evaluated by Physical Therapy with no further acute PT needs identified. PTA, pt mod indep with intermittent use of RW, lives with sister; pt reports he was supposed to start with outpatient PT since recent hospital admission, but missed his first appt 8/14 due to hospital admission. Today, pt mobilizing independently in room; noted BLE pitting edema, but pt reports improvement in swelling since admission. All education has been completed and the patient has no further questions. Acute PT is signing off. Thank you for this referral.      If plan is discharge home, recommend the following: Assistance with cooking/housework;Assist for transportation   Can travel by private vehicle    Yes    Equipment Recommendations BSC/3in1  Recommendations for Other Services       Functional Status Assessment       Precautions / Restrictions Precautions Precautions: Fall Restrictions Weight Bearing Restrictions: No      Mobility  Bed Mobility Overal bed mobility: Modified Independent Bed Mobility: Supine to Sit, Sit to Supine           General bed mobility comments: HOB elevated    Transfers Overall transfer level: Modified independent Equipment used: None   Sit to Stand: Modified independent (Device/Increase time), From elevated surface           General transfer comment: pt requesting EOB be elevated to stand, mod indep without DME    Ambulation/Gait Ambulation/Gait assistance: Modified  independent (Device/Increase time)   Assistive device: None Gait Pattern/deviations: Step-through pattern, Decreased stride length, Wide base of support Gait velocity: decreased     General Gait Details: slow, guarded gait in room mod indep for increased time, no DME; wide BOS which pt attributes to swelling. declines hallway ambulation  Stairs            Wheelchair Mobility     Tilt Bed    Modified Rankin (Stroke Patients Only)       Balance Overall balance assessment: Mild deficits observed, not formally tested Sitting-balance support: Feet supported, No upper extremity supported Sitting balance-Leahy Scale: Good     Standing balance support: No upper extremity supported, During functional activity Standing balance-Leahy Scale: Good Standing balance comment: can stand and walk without UE support                             Pertinent Vitals/Pain Pain Assessment Pain Assessment: Faces Faces Pain Scale: Hurts a little bit Pain Location: abdomen, bilateral lower legs Pain Descriptors / Indicators: Heaviness, Sore Pain Intervention(s): Monitored during session    Home Living Family/patient expects to be discharged to:: Private residence Living Arrangements: Other relatives (sister) Available Help at Discharge: Family;Available PRN/intermittently Type of Home: House Home Access: Stairs to enter Entrance Stairs-Rails: None Entrance Stairs-Number of Steps: 1   Home Layout: One level Home Equipment: Tub bench;Rolling Walker (2 wheels)      Prior Function Prior Level of Function : Independent/Modified Independent  Mobility Comments: typically independent without DME; was using RW initially after recent admission but has not needed as much recently. was supposed to start with outpatient PT, but missed appt due to hospital admission. does not get out much. ADLs Comments: independent with ADLs; reports some difficulty standing from low  toilet height. sister assists with iADLs as needed. pt does not drive     Extremity/Trunk Assessment   Upper Extremity Assessment Upper Extremity Assessment: Overall WFL for tasks assessed    Lower Extremity Assessment Lower Extremity Assessment: RLE deficits/detail;LLE deficits/detail RLE Deficits / Details: BLE pitting edema, though pt notes improvement in swelling since admission; gross strength testing >/ 4/5 strength BLEs; ankle AROM limited secondary to swelling; light touch sensation in tact, pt reports symmetrical, denies numbness/tingling LLE Deficits / Details: BLE pitting edema, though pt notes improvement in swelling since admission; gross strength testing >/ 4/5 strength BLEs; ankle AROM limited secondary to swelling; light touch sensation in tact, pt reports symmetrical, denies numbness/tingling    Cervical / Trunk Assessment Cervical / Trunk Assessment: Other exceptions Cervical / Trunk Exceptions: significant abdominal swelling  Communication      Cognition Arousal: Alert Behavior During Therapy: WFL for tasks assessed/performed Overall Cognitive Status: Within Functional Limits for tasks assessed                                          General Comments General comments (skin integrity, edema, etc.): educ re: activity recommendations, importance of mobility, desire to try outpatient PT (this was set up last admission, but he missed first appt due to this hospital admission). pt received sleeping with O2 [place], SpO2 >/94% on RA with activity, pt reports "I sleep better with it on." HR 104. sister present and supportive    Exercises     Assessment/Plan    PT Assessment All further PT needs can be met in the next venue of care  PT Problem List         PT Treatment Interventions      PT Goals (Current goals can be found in the Care Plan section)  Acute Rehab PT Goals Patient Stated Goal: work with outpatient PT PT Goal Formulation: All assessment  and education complete, DC therapy    Frequency Min 1X/week     Co-evaluation               AM-PAC PT "6 Clicks" Mobility  Outcome Measure Help needed turning from your back to your side while in a flat bed without using bedrails?: None Help needed moving from lying on your back to sitting on the side of a flat bed without using bedrails?: A Little Help needed moving to and from a bed to a chair (including a wheelchair)?: None Help needed standing up from a chair using your arms (e.g., wheelchair or bedside chair)?: A Little Help needed to walk in hospital room?: None Help needed climbing 3-5 steps with a railing? : A Little 6 Click Score: 21    End of Session   Activity Tolerance: Patient tolerated treatment well;Patient limited by fatigue Patient left: in bed;with call bell/phone within reach;with family/visitor present Nurse Communication: Mobility status PT Visit Diagnosis: Other abnormalities of gait and mobility (R26.89)    Time: 5284-1324 PT Time Calculation (min) (ACUTE ONLY): 14 min   Charges:   PT Evaluation $PT Eval Low Complexity: 1 Low   PT General  Charges $$ ACUTE PT VISIT: 1 Visit       Ina Homes, PT, DPT Acute Rehabilitation Services  Personal: Secure Chat Rehab Office: [phone]  Malachy Chamber 04/21/2023, 9:21 AM

## 2023-04-21 NOTE — Progress Notes (Addendum)
   Subjective: 27 year old male who presented to the ED due to shortness of breath and chest pain was likely due to missing dialysis and found to have volume overload in the setting of acute on chronic heart failure.  Overnight Event: Had an echo last night that revealed mild concentric left ventricular hypertrophy and an estimated ejection fraction of 50%.Pericardial effusion also seemed worse compared to the ECHO in July 2024.   Objective:  Vital signs in last 24 hours: Vitals:   04/20/23 1627 04/20/23 2012 04/21/23 0437 04/21/23 0456  BP: (!) 143/79 139/79 (!) 142/93   Pulse: (!) 105 100 (!) 108   Resp: 18 18 20    Temp: 98.3 F (36.8 C)  97.6 F (36.4 C)   TempSrc:   Oral   SpO2: 92% 92% 96%   Weight:    131 kg  Height:       General: Sleeping  comfortably in bed on arrival at bedside  Cardiology: Mild crackles noted on lung auscultation Psych:Normal  Mood and affect. Abdomen:Distended abdomen. Seems a little worse compared to yesterday MSK: +1 pitting edema lower extremities bilaterally still present   Assessment/Plan:  Principal Problem:   Volume overload  #Acute hypoxemic respiratory failure 2/2 to HF exacerbation and volume overload #ESRD #Recurrent Ascites Patient has a history of heart failure and end-stage renal disease, who got admitted due to shortness of breath and chest pain in the setting of volume overload.  Patient had dialysis yesterday with removal of about 5 L of fluid.  He also had an echo that showed evidence of increased pleural effusion, ventricular ejection fraction of about 50% and also mild left ventricular concentric hypertrophy.Cardiology recommends continued dialysis for pleural effusion.  Got dialyzed yesterday and have and had only 4 L fluid was removed, a total of 10.7L fluids removed during this hospitalization.Patient weighs 131kg from 132.3kg yesterday.  Nephrology  is following and the plan is to continue on dialysis today and tomorrow.   Continue to assess patient's volume status and weight postdialysis - HD today with UF  - Morning Renal function panel   #Hypertension Patient has a history of hypertension currently being managed with Coreg and Entresto.  Blood pressure today is 142/93 from 145/85 yesterday despite having his dose of Entresto and Coreg.  Wondering if these high pressures is due to patient's volume status.  Done yesterday revealed increased pericardial effusion compared to the last one done in July 2024.  Scheduled for hemodialysis today and tomorrow.  Will Consider holding on on making any changes to his antihypertensive at this time until extra fluid this taken out . Will reassess patient's blood pressure after each dialysis. - Daily BP and weight checks   # Concerns for diabetes of unspecified type Patient reported history of diabetes but unsure if type I or type II at this time. Last A1c was 6.2 and glucose today is 137. Patient is however on no home regimen for glycemic control.Anti-insulin, GAD65, IA2, and ZnT8 antibodies sent, will follow-up with these results and drop on appropriate glycemic control regimen.  Continue sliding scale insulin with meals.  Prior to Admission Living Arrangement: Anticipated Discharge Location: Barriers to Discharge: Dispo: Anticipated discharge in approximately 2 day(s).   Kathleen Lime, MD 04/21/2023, 6:19 AM After 5pm on weekdays and 1pm on weekends: On Call pager [phone]

## 2023-04-21 NOTE — Progress Notes (Signed)
[facility] Progress Note   [facility] F180, BFR 500, 4hr 15 min, 2 ca, 2K, edw 105.3kg (recent post weight 136kg), L AVF, mircera 150mg  last given 7/2, hectorol w/ hd, sensipar 30mg  daily, lanthanum 1 tab w/ meals  Assessment/Plan: # ESRD: Profoundly volume overloaded.  Volume issues detailed below.  He will likely need serial HD for the next few days.Using home prescription but likely shortened to 4 hours.Continue serial HD. HD tomorrow.    # Volume/ hypertension: EDW 105.3 kg but not even close in the outpatient setting.  Chronic issues with compliance.  Massively volume overloaded.  Plan for hemodialysis again tomorrow with aggressive ultrafiltration.  S/p paracentesis by IR 8/15.  Continue home blood pressure medications   # Anemia of Chronic Kidney Disease: Hemoglobin seems to be fluctuating. Has not received ESA recently.  Iron stores fairly adequate with ferritin of 941 recently. Hemoglobin trending down - will resume ESA 8/17.    # Secondary Hyperparathyroidism/Hyperphosphatemia: Hold home Sensipar given lower calcium.  Hectorol 10 mcg with dialysis.  Home lanthanum.   # Vascular access: Left upper extremity AVF with no issues   # Acute hypoxic respiratory failure: Related to volume overload in the setting of noncompliance with dialysis.  Requiring dialysis and has tachypnea.  Ultrafiltration as above.   # Recurrent ascites: Mostly related to dialysis noncompliance.  S/p para in IR 8/15. Appears to be accumulating again.    # Enlarged cardiac silhouette: Present on chest x-ray.  Consider echocardiogram.  # Additional recommendations: - Dose all meds for creatinine clearance < 10 ml/min  - Unless absolutely necessary, no MRIs with gadolinium.  - Implement save arm precautions.  Prefer needle sticks in the dorsum of the hands or wrists.  No blood pressure measurements in arm. - If blood transfusion is requested during hemodialysis sessions, please alert [place]  prior to the session.  - Use synthetic opioids (Fentanyl/Dilaudid) if needed   Subjective:    Seen in [facility]. UF goal 4L. Feels like "stomach messed up" Had loose stools this am.   Objective Vitals:   04/20/23 2012 04/21/23 0437 04/21/23 0456 04/21/23 0827  BP: 139/79 (!) 142/93  132/75  Pulse: 100 (!) 108  (!) 101  Resp: 18 20  19   Temp:  97.6 F (36.4 C)  98.5 F (36.9 C)  TempSrc:  Oral    SpO2: 92% 96%  100%  Weight:   131 kg   Height:       Physical Exam General: Awake, alert, NAD Heart: RRR  Lungs: Clear bilaterally  Abdomen: Large and distended with ascites  Extremities: 2-3+ edema bilateral lower extremities Dialysis Access: L AVF (+) B/T   Filed Weights   04/20/23 0804 04/20/23 1248 04/21/23 0456  Weight: 132.3 kg 129.6 kg 131 kg   No intake or output data in the 24 hours ending 04/21/23 0952   Additional Objective Labs: Basic Metabolic Panel: Recent Labs  Lab 04/19/23 0448 04/20/23 0343 04/21/23 0338  NA 138 135 136  K 4.6 4.3 4.0  CL 91* 90* 91*  CO2 33* 32 33*  GLUCOSE 136* 120* 133*  BUN 45* 43* 33*  CREATININE 9.28* 8.75* 6.86*  CALCIUM 8.7* 8.0* 7.9*  PHOS  --   --  5.8*   Liver Function Tests: Recent Labs  Lab 04/18/23 2332 04/19/23 0448 04/20/23 0343 04/21/23 0338  AST 26 21 19   --   ALT 16 16 13   --   ALKPHOS 92 89 85  --  BILITOT 1.4* 1.1 1.2  --   PROT 7.9 6.8 6.3*  --   ALBUMIN 1.9* 1.7* 1.5* <1.5*   No results for input(s): "LIPASE", "AMYLASE" in the last 168 hours. CBC: Recent Labs  Lab 04/18/23 1700 04/18/23 2340 04/19/23 0448 04/20/23 0343 04/20/23 1419 04/21/23 0338  WBC 6.4  --  6.3 5.4 5.1 5.9  NEUTROABS 4.5  --   --   --   --  4.0  HGB 8.5*   < > 8.3* 8.3* 8.8* 7.9*  HCT 28.1*   < > 27.8* 27.9* 29.8* 26.2*  MCV 85.9  --  84.5 86.1 84.4 83.2  PLT 477*  --  429* 392 388 370   < > = values in this interval not displayed.   Blood Culture    Component Value Date/Time   SDES BLOOD BLOOD RIGHT WRIST  04/01/2023 1616   SPECREQUEST  04/01/2023 1616    BOTTLES DRAWN AEROBIC AND ANAEROBIC Blood Culture adequate volume   CULT  04/01/2023 1616    NO GROWTH 5 DAYS Performed at [facility], [address] [place], [place] [address]    REPTSTATUS 04/06/2023 FINAL 04/01/2023 1616    Cardiac Enzymes: No results for input(s): "CKTOTAL", "CKMB", "CKMBINDEX", "TROPONINI" in the last 168 hours. CBG: Recent Labs  Lab 04/19/23 2021 04/20/23 0714 04/20/23 1625 04/20/23 2014 04/21/23 0719  GLUCAP 100* 117* 125* 137* 110*   Iron Studies: No results for input(s): "IRON", "TIBC", "TRANSFERRIN", "FERRITIN" in the last 72 hours. Lab Results  Component Value Date   INR 1.7 (H) 04/01/2023   Studies/Results: ECHOCARDIOGRAM LIMITED  Result Date: 04/20/2023    ECHOCARDIOGRAM LIMITED REPORT   Patient Name:   Bobby [name] Date of Exam: 04/20/2023 Medical Rec #:  [id number]          Height:       77.0 in Accession #:    [id number]         Weight:       291.7 lb Date of Birth:  [birth year]           BSA:          2.626 m Patient Age:    27 years           BP:           145/85 mmHg Patient Gender: M                  HR:           99 bpm. Exam Location:  Inpatient Procedure: Limited Color Doppler, Limited Echo and Cardiac Doppler Indications:    Pericardial Effusion  History:        Patient has prior history of Echocardiogram examinations, most                 recent 04/01/2023. Signs/Symptoms:Edema; Risk Factors:CKD,                 Diabetes, Hypertension and Former Smoker. ESRD.  Sonographer:    Raeford Razor Referring Phys: [id number] JON KNAPP IMPRESSIONS  1. Compared to echo from July 2024, LVEF appears improved and pericardial effusion is larger.  2. Apical window is foreshortened, limiting assessment of LVEF . Left ventricular ejection fraction, by estimation, is 50%. There is mild concentric left ventricular hypertrophy.  3. Right ventricular systolic function is mildly reduced. The right ventricular size is  mildly enlarged.  4. Large pericardial effusion. The pericardial effusion is circumferential.  5. Trivial mitral valve  regurgitation.  6. The aortic valve is normal in structure. Aortic valve regurgitation is not visualized.  7. The inferior vena cava is normal in size with greater than 50% respiratory variability, suggesting right atrial pressure of 3 mmHg. FINDINGS  Left Ventricle: Apical window is foreshortened, limiting assessment of LVEF. Left ventricular ejection fraction, by estimation, is 50%. The left ventricular internal cavity size was normal in size. There is mild concentric left ventricular hypertrophy. Right Ventricle: The right ventricular size is mildly enlarged. Right ventricular systolic function is mildly reduced. Left Atrium: Left atrial size was normal in size. Right Atrium: Right atrial size was normal in size. Pericardium: Large pericardial effusion with some fibrous stranding surrounds heart Measurs 35-40 mm in maximal dimension. No RV or RA collapse CLinical correlation indicated. A large pericardial effusion is present. The pericardial effusion is circumferential. Mitral Valve: There is mild thickening of the mitral valve leaflet(s). Trivial mitral valve regurgitation. Tricuspid Valve: The tricuspid valve is normal in structure. Tricuspid valve regurgitation is trivial. Aortic Valve: The aortic valve is normal in structure. Aortic valve regurgitation is not visualized. Pulmonic Valve: The pulmonic valve was normal in structure. Pulmonic valve regurgitation is not visualized. Aorta: The aortic root and ascending aorta are structurally normal, with no evidence of dilitation. Venous: The inferior vena cava is normal in size with greater than 50% respiratory variability, suggesting right atrial pressure of 3 mmHg. IAS/Shunts: No atrial level shunt detected by color flow Doppler. LEFT VENTRICLE PLAX 2D LVIDd:         6.00 cm LVIDs:         3.70 cm LV PW:         1.40 cm LV IVS:        1.30 cm  LVOT diam:     2.20 cm LVOT Area:     3.80 cm  IVC IVC diam: 2.40 cm LEFT ATRIUM         Index LA diam:    4.00 cm 1.52 cm/m   AORTA Ao Root diam: 2.40 cm Ao Asc diam:  3.20 cm  SHUNTS Systemic Diam: 2.20 cm Dietrich Pates MD Electronically signed by Dietrich Pates MD Signature Date/Time: 04/20/2023/5:23:05 PM    Final    IR Paracentesis  Result Date: 04/19/2023 INDICATION: Congestive heart failure and end-stage renal disease on hemodialysis with ascites. Request for paracentesis. EXAM: ULTRASOUND GUIDED PARACENTESIS MEDICATIONS: 1% lidocaine 10 mL COMPLICATIONS: None immediate. PROCEDURE: Informed written consent was obtained from the patient after a discussion of the risks, benefits and alternatives to treatment. A timeout was performed prior to the initiation of the procedure. Initial ultrasound scanning demonstrates a large amount of ascites within the right lower abdominal quadrant. The right lower abdomen was prepped and draped in the usual sterile fashion. 1% lidocaine was used for local anesthesia. Following this, a 19 gauge, 7-cm, Yueh catheter was introduced. An ultrasound image was saved for documentation purposes. The paracentesis was performed. The catheter was removed and a dressing was applied. The patient tolerated the procedure well without immediate post procedural complication. FINDINGS: A total of approximately 4 L of clear yellow fluid was removed. IMPRESSION: Successful ultrasound-guided paracentesis yielding 4 liters of peritoneal fluid. Procedure performed by: Alwyn Ren, NP Electronically Signed   By: Acquanetta Belling M.D.   On: 04/19/2023 16:15    Medications:   carvedilol  25 mg Oral BID WC   Chlorhexidine Gluconate Cloth  6 each Topical Q0600   cinacalcet  30 mg Oral Daily  diclofenac Sodium  4 g Topical QID   gabapentin  100 mg Oral TID   heparin  5,000 Units Subcutaneous Q8H   heparin sodium (porcine)  2,000 Units Intracatheter Once   insulin aspart  0-6 Units Subcutaneous TID  WC   ipratropium-albuterol  3 mL Nebulization Once   lanthanum  1,000 mg Oral TID WC   melatonin  3 mg Oral QHS   sacubitril-valsartan  1 tablet Oral BID   sertraline  50 mg Oral Daily    Dialysis Orders:   Tomasa Blase PA-C [facility] 04/21/2023,9:52 AM

## 2023-04-22 DIAGNOSIS — E8770 Fluid overload, unspecified: Secondary | ICD-10-CM | POA: Diagnosis not present

## 2023-04-22 DIAGNOSIS — N186 End stage renal disease: Secondary | ICD-10-CM | POA: Diagnosis not present

## 2023-04-22 DIAGNOSIS — Z992 Dependence on renal dialysis: Secondary | ICD-10-CM | POA: Diagnosis not present

## 2023-04-22 DIAGNOSIS — J9601 Acute respiratory failure with hypoxia: Secondary | ICD-10-CM | POA: Diagnosis not present

## 2023-04-22 DIAGNOSIS — E8809 Other disorders of plasma-protein metabolism, not elsewhere classified: Secondary | ICD-10-CM

## 2023-04-22 LAB — RENAL FUNCTION PANEL
Albumin: <1.5 g/dL — ABNORMAL LOW (ref 3.5–5.0)
Anion gap: 12 (ref 5–15)
BUN: 26 mg/dL — ABNORMAL HIGH (ref 6–20)
CO2: 31 mmol/L (ref 22–32)
Calcium: 8.3 mg/dL — ABNORMAL LOW (ref 8.9–10.3)
Chloride: 93 mmol/L — ABNORMAL LOW (ref 98–111)
Creatinine, Ser: 5.71 mg/dL — ABNORMAL HIGH (ref 0.61–1.24)
GFR, Estimated: 13 mL/min — ABNORMAL LOW (ref >60)
Glucose, Bld: 138 mg/dL — ABNORMAL HIGH (ref 70–99)
Phosphorus: 5.3 mg/dL — ABNORMAL HIGH (ref 2.5–4.6)
Potassium: 3.9 mmol/L (ref 3.5–5.1)
Sodium: 136 mmol/L (ref 135–145)

## 2023-04-22 LAB — GLUCOSE, CAPILLARY
Glucose-Capillary: 112 mg/dL — ABNORMAL HIGH (ref 70–99)
Glucose-Capillary: 141 mg/dL — ABNORMAL HIGH (ref 70–99)
Glucose-Capillary: 139 mg/dL — ABNORMAL HIGH (ref 70–99)

## 2023-04-22 LAB — CBC
HCT: 26.5 % — ABNORMAL LOW (ref 39.0–52.0)
Hemoglobin: 7.8 g/dL — ABNORMAL LOW (ref 13.0–17.0)
MCH: 24.6 pg — ABNORMAL LOW (ref 26.0–34.0)
MCHC: 29.4 g/dL — ABNORMAL LOW (ref 30.0–36.0)
MCV: 83.6 fL (ref 80.0–100.0)
Platelets: 385 10*3/uL (ref 150–400)
RBC: 3.17 MIL/uL — ABNORMAL LOW (ref 4.22–5.81)
RDW: 15.6 % — ABNORMAL HIGH (ref 11.5–15.5)
WBC: 5.4 10*3/uL (ref 4.0–10.5)
nRBC: 0 % (ref 0.0–0.2)

## 2023-04-22 NOTE — Progress Notes (Signed)
Initial Nutrition Assessment  DOCUMENTATION CODES:   Obesity unspecified  INTERVENTION:  - Continue Renal, 1500 mL diet.   - Attach Renal diet education to discharge paperwork.   NUTRITION DIAGNOSIS:   Altered nutrition lab value related to chronic illness as evidenced by other (comment) (elevated phosphorus).  GOAL:   Other (Comment)  MONITOR:   PO intake  REASON FOR ASSESSMENT:   Consult Assessment of nutrition requirement/status  ASSESSMENT:   27 y.o. male admits related to SOB. PMH includes: ESRD on HD, HTN, T1DM, HF, recurrent ascites. Pt is currently receiving medical management related to acute hypoxemic respiratory failure 2/2 to HF exacerbation and volume overload.  Meds reviewed:  sliding scale insulin. Labs reviewed: BUN/Creatinine high, phos high.   RD unable to reach pt via phone. Pt is currently on a Renal diet with a 1500 mL fluid restriction. Per record, pt has been eating 75-100% of his meals since admission. No significant wt loss per record. RD will add diet education handouts to discharge paperwork. RD will continue to monitor PO intakes.   NUTRITION - FOCUSED PHYSICAL EXAM:  Remote assessment.   Diet Order:   Diet Order             Diet renal with fluid restriction Fluid restriction: 1500 mL Fluid; Room service appropriate? Yes; Fluid consistency: Thin  Diet effective now                   EDUCATION NEEDS:   Not appropriate for education at this time  Skin:  Skin Assessment: Reviewed RN Assessment  Last BM:  8/14  Height:   Ht Readings from Last 1 Encounters:  04/19/23 6\' 5"  (1.956 m)    Weight:   Wt Readings from Last 1 Encounters:  04/21/23 (S) 125.3 kg    Ideal Body Weight:     BMI:  Body mass index is 32.76 kg/m.  Estimated Nutritional Needs:   Kcal:  2500-2800 kcals  Protein:  125-140 gm  Fluid:  >/= 2.5 L  Bethann Humble, RD, LDN, CNSC.

## 2023-04-22 NOTE — Progress Notes (Addendum)
[facility] Progress Note   [facility] F180, BFR 500, 4hr 15 min, 2 ca, 2K, edw 105.3kg (recent post weight 136kg), L AVF, mircera 150mg  last given 7/2, hectorol w/ hd, sensipar 30mg  daily, lanthanum 1 tab w/ meals  Assessment/Plan: # ESRD: Profoundly volume overloaded.  Volume issues detailed below.  He will likely need serial HD for the next few days.Using home prescription but likely shortened to 4 hours. Had daily dialysis 8/14 -8/17. I think can resume regular schedule.   # Volume/ hypertension: EDW 105.3 kg but not even close in the outpatient setting.  Chronic issues with compliance.  Massively volume overloaded.    S/p paracentesis by IR 8/15.  Continue home blood pressure medications. Volume status has improved with serial dialysis/paracentesis. Weight is down to 125kg. This will be an ongoing issue. Next dialysis on 8/20 on TTS schedule.    # Anemia of Chronic Kidney Disease: Hemoglobin seems to be fluctuating. Has not received ESA recently.  Iron stores fairly adequate with ferritin of 941 recently. Hemoglobin trending down - ESA ordered 8/17.    # Secondary Hyperparathyroidism/Hyperphosphatemia: Hold home Sensipar given lower calcium.  Hectorol 10 mcg with dialysis.  Home lanthanum.   # Vascular access: Left upper extremity AVF with no issues   # Acute hypoxic respiratory failure: Related to volume overload in the setting of noncompliance with dialysis.  Requiring dialysis and has tachypnea.  Ultrafiltration as above.   # Recurrent ascites: Mostly related to dialysis noncompliance.  S/p para in IR 8/15.    # Enlarged cardiac silhouette: Present on chest x-ray.  Repeat Echo 8/16 - EF improved to 50%,  pericardial effusion larger  # Additional recommendations: - Dose all meds for creatinine clearance < 10 ml/min  - Unless absolutely necessary, no MRIs with gadolinium.  - Implement save arm precautions.  Prefer needle sticks in the dorsum of the  hands or wrists.  No blood pressure measurements in arm. - If blood transfusion is requested during hemodialysis sessions, please alert [place] prior to the session.  - Use synthetic opioids (Fentanyl/Dilaudid) if needed   Tomasa Blase PA-C [facility] 04/22/2023,11:05 AM  Pt seen, examined and agree w assess/plan as above with additions as indicated.  Rob [facility] 04/22/2023, 6:15 PM    Subjective:    Seen in room. No new events overnight. Denies chest pain/dyspnea.    Objective Vitals:   04/21/23 2033 04/22/23 0532 04/22/23 0753 04/22/23 0907  BP: (!) 142/81 (!) 146/85 (!) 149/98 134/66  Pulse: 96 98 (!) 101 93  Resp: 18 16 18 16   Temp: 98.9 F (37.2 C) 98.7 F (37.1 C) 99 F (37.2 C) 98.7 F (37.1 C)  TempSrc:   Oral   SpO2: 95% 100% 91% 92%  Weight:      Height:       Physical Exam General: Awake, alert, NAD Heart: RRR  Lungs: Clear bilaterally  Abdomen: Large and distended  Extremities: 2-3+ edema bilateral lower extremities Dialysis Access: L AVF (+) B/T   Filed Weights   04/21/23 0456 04/21/23 0956 04/21/23 1502  Weight: 131 kg 129 kg (S) 125.3 kg    Intake/Output Summary (Last 24 hours) at 04/22/2023 1105 Last data filed at 04/22/2023 0532 Gross per 24 hour  Intake 440 ml  Output 4000 ml  Net -3560 ml     Additional Objective Labs: Basic Metabolic Panel: Recent Labs  Lab 04/20/23 0343 04/21/23 0338 04/22/23 0102  NA  135 136 136  K 4.3 4.0 3.9  CL 90* 91* 93*  CO2 32 33* 31  GLUCOSE 120* 133* 138*  BUN 43* 33* 26*  CREATININE 8.75* 6.86* 5.71*  CALCIUM 8.0* 7.9* 8.3*  PHOS  --  5.8* 5.3*   Liver Function Tests: Recent Labs  Lab 04/18/23 2332 04/19/23 0448 04/20/23 0343 04/21/23 0338 04/22/23 0102  AST 26 21 19   --   --   ALT 16 16 13   --   --   ALKPHOS 92 89 85  --   --   BILITOT 1.4* 1.1 1.2  --   --   PROT 7.9 6.8 6.3*  --   --   ALBUMIN 1.9* 1.7* 1.5* <1.5* <1.5*   No results for  input(s): "LIPASE", "AMYLASE" in the last 168 hours. CBC: Recent Labs  Lab 04/18/23 1700 04/18/23 2340 04/19/23 0448 04/20/23 0343 04/20/23 1419 04/21/23 0338 04/22/23 0102  WBC 6.4  --  6.3 5.4 5.1 5.9 5.4  NEUTROABS 4.5  --   --   --   --  4.0  --   HGB 8.5*   < > 8.3* 8.3* 8.8* 7.9* 7.8*  HCT 28.1*   < > 27.8* 27.9* 29.8* 26.2* 26.5*  MCV 85.9  --  84.5 86.1 84.4 83.2 83.6  PLT 477*  --  429* 392 388 370 385   < > = values in this interval not displayed.   Blood Culture    Component Value Date/Time   SDES BLOOD BLOOD RIGHT WRIST 04/01/2023 1616   SPECREQUEST  04/01/2023 1616    BOTTLES DRAWN AEROBIC AND ANAEROBIC Blood Culture adequate volume   CULT  04/01/2023 1616    NO GROWTH 5 DAYS Performed at [facility], [address] [place], [place] [address]    REPTSTATUS 04/06/2023 FINAL 04/01/2023 1616    Cardiac Enzymes: No results for input(s): "CKTOTAL", "CKMB", "CKMBINDEX", "TROPONINI" in the last 168 hours. CBG: Recent Labs  Lab 04/20/23 2014 04/21/23 0719 04/21/23 1608 04/21/23 2037 04/22/23 0711  GLUCAP 137* 110* 100* 153* 112*   Iron Studies: No results for input(s): "IRON", "TIBC", "TRANSFERRIN", "FERRITIN" in the last 72 hours. Lab Results  Component Value Date   INR 1.7 (H) 04/01/2023   Studies/Results: ECHOCARDIOGRAM LIMITED  Result Date: 04/20/2023    ECHOCARDIOGRAM LIMITED REPORT   Patient Name:   Bobby [name] Date of Exam: 04/20/2023 Medical Rec #:  [id number]          Height:       77.0 in Accession #:    [id number]         Weight:       291.7 lb Date of Birth:  December 25, 1995           BSA:          2.626 m Patient Age:    27 years           BP:           145/85 mmHg Patient Gender: M                  HR:           99 bpm. Exam Location:  Inpatient Procedure: Limited Color Doppler, Limited Echo and Cardiac Doppler Indications:    Pericardial Effusion  History:        Patient has prior history of Echocardiogram examinations, most  recent 04/01/2023. Signs/Symptoms:Edema; Risk Factors:CKD,                 Diabetes, Hypertension and Former Smoker. ESRD.  Sonographer:    Raeford Razor Referring Phys: [id number] JON KNAPP IMPRESSIONS  1. Compared to echo from July 2024, LVEF appears improved and pericardial effusion is larger.  2. Apical window is foreshortened, limiting assessment of LVEF . Left ventricular ejection fraction, by estimation, is 50%. There is mild concentric left ventricular hypertrophy.  3. Right ventricular systolic function is mildly reduced. The right ventricular size is mildly enlarged.  4. Large pericardial effusion. The pericardial effusion is circumferential.  5. Trivial mitral valve regurgitation.  6. The aortic valve is normal in structure. Aortic valve regurgitation is not visualized.  7. The inferior vena cava is normal in size with greater than 50% respiratory variability, suggesting right atrial pressure of 3 mmHg. FINDINGS  Left Ventricle: Apical window is foreshortened, limiting assessment of LVEF. Left ventricular ejection fraction, by estimation, is 50%. The left ventricular internal cavity size was normal in size. There is mild concentric left ventricular hypertrophy. Right Ventricle: The right ventricular size is mildly enlarged. Right ventricular systolic function is mildly reduced. Left Atrium: Left atrial size was normal in size. Right Atrium: Right atrial size was normal in size. Pericardium: Large pericardial effusion with some fibrous stranding surrounds heart Measurs 35-40 mm in maximal dimension. No RV or RA collapse CLinical correlation indicated. A large pericardial effusion is present. The pericardial effusion is circumferential. Mitral Valve: There is mild thickening of the mitral valve leaflet(s). Trivial mitral valve regurgitation. Tricuspid Valve: The tricuspid valve is normal in structure. Tricuspid valve regurgitation is trivial. Aortic Valve: The aortic valve is normal in structure. Aortic valve  regurgitation is not visualized. Pulmonic Valve: The pulmonic valve was normal in structure. Pulmonic valve regurgitation is not visualized. Aorta: The aortic root and ascending aorta are structurally normal, with no evidence of dilitation. Venous: The inferior vena cava is normal in size with greater than 50% respiratory variability, suggesting right atrial pressure of 3 mmHg. IAS/Shunts: No atrial level shunt detected by color flow Doppler. LEFT VENTRICLE PLAX 2D LVIDd:         6.00 cm LVIDs:         3.70 cm LV PW:         1.40 cm LV IVS:        1.30 cm LVOT diam:     2.20 cm LVOT Area:     3.80 cm  IVC IVC diam: 2.40 cm LEFT ATRIUM         Index LA diam:    4.00 cm 1.52 cm/m   AORTA Ao Root diam: 2.40 cm Ao Asc diam:  3.20 cm  SHUNTS Systemic Diam: 2.20 cm Dietrich Pates MD Electronically signed by Dietrich Pates MD Signature Date/Time: 04/20/2023/5:23:05 PM    Final     Medications:   carvedilol  25 mg Oral BID WC   Chlorhexidine Gluconate Cloth  6 each Topical Q0600   cinacalcet  30 mg Oral Daily   diclofenac Sodium  4 g Topical QID   gabapentin  100 mg Oral TID   heparin  5,000 Units Subcutaneous Q8H   heparin sodium (porcine)  2,000 Units Intracatheter Once   insulin aspart  0-6 Units Subcutaneous TID WC   ipratropium-albuterol  3 mL Nebulization Once   lanthanum  1,000 mg Oral TID WC   melatonin  3 mg Oral QHS   sacubitril-valsartan  1  tablet Oral BID   sertraline  50 mg Oral Daily

## 2023-04-22 NOTE — Progress Notes (Addendum)
   Subjective: 27 year old male who presented to the ED due to shortness of breath and chest pain was likely due to missing dialysis and found to have volume overload in the setting of acute on chronic heart failure.  Overnight Event: Had dialysis yesterday and had 4L fluid removed   Objective:  Vital signs in last 24 hours: Vitals:   04/21/23 2033 04/22/23 0532 04/22/23 0753 04/22/23 0907  BP: (!) 142/81 (!) 146/85 (!) 149/98 134/66  Pulse: 96 98 (!) 101 93  Resp: 18 16 18 16   Temp: 98.9 F (37.2 C) 98.7 F (37.1 C) 99 F (37.2 C) 98.7 F (37.1 C)  TempSrc:   Oral   SpO2: 95% 100% 91% 92%  Weight:      Height:       General: Laying comfortably in bed   Cardiology: Improved crackles noted on lung auscultation Psych:Normal  Mood and affect. Abdomen:Distended abdomen  Assessment/Plan:  Principal Problem:   Volume overload Active Problems:   Anemia due to chronic kidney disease   Anasarca   Ascites   Hepatomegaly   Splenomegaly   #Hypoalbuminemia ##Recurrent Ascites History of end-stage renal disease. Albumin of 1.5.Presented with a large to moderate ascites for which IR drained about 4L last week. Patient reports multiple history of ascites and abdominal distention and discomfort especially during non-dialysis days which is most likely due to low albumin. Patient denied any official diagnosis of liver disease. I will consult nutrition to further access his nutrition with the intent of increasing protein intake  -Follow-up with nutrition consult  #Acute hypoxemic respiratory failure 2/2 to HF exacerbation and volume overload #ESRD Patient had dialysis yesterday with removal of about 4 L of fluid, a total of 14.7L fluids removed during this hospitalization.Patient weighs 125 kg from 131kg yesterday.  Nephrology  is following and the plan is to continue dialysis  tomorrow prior to discharge.  Continue to assess patient's volume status and weight postdialysis - HD tomorrow  per nephology  #Hypertension Patient has a history of hypertension currently being managed with Coreg and Entresto.  BP is 134/66 today which is a much improvement from yesterday (149/98). - Continue current  GDMT  # Concerns for diabetes of unspecified type Patient reported history of diabetes but unsure if type I or type II at this time. Last A1c was 6.2 and glucose today is 137. Patient is however on no home regimen for glycemic control.Anti-insulin, GAD65, IA2, and ZnT8 antibodies sent, will follow-up with these results and drop on appropriate glycemic control regimen.  Continue sliding scale insulin with meals as needed.  Prior to Admission Living Arrangement: Anticipated Discharge Location: Barriers to Discharge: Dispo: Anticipated discharge in approximately 2 day(s).   Kathleen Lime, MD 04/22/2023, 2:27 PM After 5pm on weekdays and 1pm on weekends: On Call pager [phone]

## 2023-04-22 NOTE — Discharge Instructions (Signed)
Dear Bobby [name],   It was a pleasure taking care of you at [facility]. You were admitted for volume overload  and treated for acute on chronic heart failure . We are discharging you home now that you are doing better. Please follow the following instructions.  1) For your [facility] continue to follow up with your regular schedule Tuesdays, Thursdays and Friday 2) You also had chronic malnutrition for which Nutrition spoke to you about. Please read their instruction below to help bring your proteins up 3) For your hypertension, we are sending you home the same medication we had you on in the hospital. Please take them as prescribed and follow up with your primary care physician.  Take care,  Dr. Kathleen Lime, MD    Choosing healthy food, staying physically active and taking medicines as prescribed by your health care provider may help slow down the progression of kidney disease. There is not one eating plan that is right for everyone with kidney disease. Your registered dietitian nutritionist (RDN) will help you identify what's best for you to eat. Why is nutrition important in kidney disease? Your kidneys help keep nutrients and minerals balanced in your body and remove the waste products from your blood. With kidney disease, your kidneys may not be able to do this job very well. You may need to make some changes to your diet. You may need to control the amount of protein, sodium, potassium, phosphorus or calcium in your diet. You will also still need to follow diet recommendations for any other conditions you have, like heart disease or diabetes. Fortunately, these diets are similar. Your nutrition care plan might change over time depending on the status of your condition. Your registered dietitian nutritionist  (RDN) or health care provider will tell you if changes are needed based on your blood test results.   Tips How to plan a kidney-friendly meal Fill a 9-inch or 10-inch plate  with: Fruits and/or vegetables Breads, cereals, or grains Protein   A healthy fat Your body needs protein to help build muscle, repair tissue, and fight infection. If you have kidney disease, eating less protein can help protect your kidneys if you are not on dialysis. The most effective way to protect your health is to eat less red meat such as beef or pork and smaller protein portions and to choose plant-proteins as a meat alternative. If you are on dialysis, the protein serving is the size of your palm. If you are not on dialysis, the protein serving is 1/3 to  the size of your palm. Your RDN will discuss how much protein you should eat. Eat at least 6 servings of grain daily and choose whole grains for at least half those servings. Fruits and vegetables are an important part of a healthy diet and help increase your intake of fiber. Eat at least 5 servings of fresh, frozen, or canned fruits and vegetables daily. These foods are a source of potassium but you only need to limit how much you eat if your potassium level is high. Products labeled as "low sodium" may use potassium chloride in place of sodium. Check the ingredient list to make sure you can safely eat low-sodium foods. You can enjoy ____servings of dairy and dairy alternatives. Your RDN will make a specific recommendation based on your individual needs. Your health care provider will let you know if you need to limit fluid intake. Less fluid will help you manage urine output, and avoid fluid retention  which can cause shortness of breath, swelling, high blood pressure, and increased strain on your heart and blood vessels.    Nutrients to Monitor You may need to pay attention to sodium, phosphorus, and potassium in your diet. Your RDN can provide you handouts on potassium and/or phosphorus for more details and strategies to manage these nutrients. Tips to limit sodium: Eat home-cooked meals made from fresh ingredients. Choose foods and  condiments with 200 milligrams of sodium or less per serving. Use frozen or packaged meals with 600 milligrams or less sodium per serving if you are too tired to cook. Check labels to avoid foods that have more than 200 milligrams of sodium per serving. These foods may include canned soups or soup mixes, packaged foods, pickled foods, sauces, and seasonings. Limit how much salt you add to foods or avoid it altogether. Salt-free seasonings like herbs, spices, lemon juice, and vinegar will flavor to your food without adding salt. Ask your RDN which frozen and convenience foods, fast foods, or restaurant meals may be ok for you. If you also have diabetes and/or heart disease: It is easy to manage these multiple diets because they are similar in many ways. Eat a variety of healthy foods. Choose whole grain foods. Eat a moderate amount of protein and choose low-fat, lean, and heart-healthy options. Eat at least 5 servings/day of fruits and vegetables. Your blood potassium level will affect which fruits and vegetables you can safely eat. Eat less food with added salt, sugars, and fats.  Your RDN can provide you with additional recommendations if necessary.    Foods to Choose or Limit Your RDN will tell you if you need to limit phosphorus or potassium in your diet and provide you separate handouts about foods to choose or limit. Food Group Choose Limit  Grains Whole grain cereal Oats, oatmeal Whole wheat bread, pita English muffin Corn tortillas Whole wheat pasta Brown rice Quinoa Couscous Grits Popcorn Rice cakes Whole wheat crackers   Grains with more than 200 milligrams of sodium per serving Boxed biscuit, cake, pancake/waffle mixes and other convenience foods Snacks and sweets should be eaten in moderation    Protein Foods Eggs or egg whites Lean beef, wild game, and "all natural" chicken, fish, pork, seafood, or [place] Legumes/Pulses: Beans (such as black, kidney, or white beans),  lentils, split peas, black-eyed peas Soy: Tofu, edamame Nuts and nut butters Protein with more than 200 mg sodium per serving Processed or frozen protein foods   Salty processed meats (such as bacon, bologna, salami and other lunch meats),  ham, hot dogs,  sausage, breakfast sausage, and pre-seasoned meats    Dairy and Dairy Alternatives Lower-phosphorus milk alternatives include unfortified almond, rice, soy or other plant beverages Lower-phosphorus cheeses include brie, goat, cream cheese, mozzarella, parmesan, or ricotta cheese Processed cheeses, such as American cheese, cheese spreads, boxed macaroni and cheese Milk-based or cheese-based soups or sauces Nondairy creamers  Vegetables Fresh, frozen, or no-salt added canned vegetables Processed vegetables or vegetable juice with more than 200 milligrams sodium per serving. Pickled foods, such as olives, sauerkraut, pickles, kimchi Vegetables with added sauces  Fruit Fresh, frozen, or canned fruit Canned fruit in syrup or with added sugars  Fats and Oils Healthy fats such as olive oil, vegetable oil or lower sodium salad dressings Butter, margarine, mayonnaise, and sour cream in moderation Dressing, condiments and other sauces with more than 200 mg of sodium per serving  Beverages/ Fluids   (Fluids include anything that is liquid  at room temperature. You may need to limit how much you drink if you are producing less urine.)   Water Coffee Tea Lemonade Seltzer Processed beverages (such as most colas, sports drinks, energy drinks, some flavored waters, drink mixes., some bottled teas and others) Canned soups with more than 200 mg sodium per serving Beer and wine    Other Herbs, spices, lemon juice, vinegars to flavor food instead of salt Stocks or broths labeled as "no salt added" Condiments and sauces with less than 200 mg sodium per serving Salt, and salt substitutes Bouillon and broths with more than 200 milligrams per serving Broths  and soups labeled as "low sodium" Condiments and sauces with more than 200 mg sodium per serving   Chronic Kidney Disease Stage 3-5 (Not on Dialysis) Sample 1-Day Menu View Nutrient Info Breakfast 1 egg, hard-boiled 1 cup oatmeal 1 cup blueberries 1 cup coffee  Lunch Sandwich made with: 2 slices whole wheat bread 1 ounce [place], sliced 2 leaves lettuce 1 teaspoon mustard 1 tablespoon mayonnaise 1 cup carrots, raw 1 apple 1 cup water with lemon  Afternoon Snack 2 tablespoons hummus 4 celery sticks  Evening Meal 2 ounces fish, broiled 1 cup brown rice, cooked  cup green peppers, sauted  cup mushrooms, sauted 2 tablespoons olive oil  cup green beans  cup peaches  Evening Snack 3 cups popcorn, air-popped 1 pear  Daily Sum Nutrient Unit Value  Macronutrients  Energy kcal 1611  Energy kJ 6744  Protein g 57  Total lipid (fat) g 59  Carbohydrate, by difference g 226  Fiber, total dietary g 38  Sugars, total g 70  Minerals  Calcium, Ca mg 354  Iron, Fe mg 10  Sodium, Na mg 835  Vitamins  Vitamin C, total ascorbic acid mg 77  Vitamin A, IU IU 22712  Vitamin D IU 74  Lipids  Fatty acids, total saturated g 10  Fatty acids, total monounsaturated g 29  Fatty acids, total polyunsaturated g 16  Cholesterol mg 239     Chronic Kidney Disease Stage 3-5 Vegan Sample 1-Day Menu View Nutrient Info Breakfast 1 cup cooked oatmeal 1 slice whole wheat toast 2 teaspoons margarine, soft, tub  cup blueberries 1 cup soy milk 1 cup coffee  Lunch 1 whole wheat pita bread  cup hummus 1 large apple  cup sliced cucumber  Evening Meal Stir-fry made with:  cup tofu  cup mung beans 1 cup rice  cup cabbage  cup carrots 1 tablespoon peanut oil  cup sliced peaches 1 cup iced tea  Evening Snack 1 cup soy yogurt 1 cup air-popped popcorn 1 tablespoon olive oil  Daily Sum Nutrient Unit Value  Macronutrients  Energy kcal 1730  Energy kJ 7234  Protein g 58  Total lipid  (fat) g 63  Carbohydrate, by difference g 249  Fiber, total dietary g 30  Sugars, total g 77  Minerals  Calcium, Ca mg 900  Iron, Fe mg 13  Sodium, Na mg 1052  Vitamins  Vitamin C, total ascorbic acid mg 75  Vitamin A, IU IU 10060  Vitamin D IU 120  Lipids  Fatty acids, total saturated g 11  Fatty acids, total monounsaturated g 26  Fatty acids, total polyunsaturated g 18  Cholesterol mg 0     Chronic Kidney Disease Stage 3-5 Vegetarian (Lacto-Ovo) Sample 1-Day Menu View Nutrient Info Breakfast 1 cup cooked oatmeal 1 slice whole wheat toast 2 teaspoons margarine, soft, tub 1 scrambled egg  cup blueberries  cup soy milk 1 cup coffee  Lunch 2 slices whole wheat bread 2 tablespoons peanut butter 1 cup lettuce  cup sliced cucumber 1 tablespoon oil and vinegar salad dressing 1 large apple  Evening Meal Stir-fry made with:  cup tofu 1 cup rice  cup canned bamboo shoots  cup carrots 1 tablespoon peanut oil  cup sliced peaches 1 cup iced tea  Evening Snack 1 cup air-popped popcorn 1 tablespoon olive oil  Daily Sum Nutrient Unit Value  Macronutrients  Energy kcal 1693  Energy kJ 7076  Protein g 54  Total lipid (fat) g 81  Carbohydrate, by difference g 201  Fiber, total dietary g 27  Sugars, total g 61  Minerals  Calcium, Ca mg 585  Iron, Fe mg 14  Sodium, Na mg 742  Vitamins  Vitamin C, total ascorbic acid mg 32  Vitamin A, IU IU 12180  Vitamin D IU 44  Lipids  Fatty acids, total saturated g 16  Fatty acids, total monounsaturated g 36  Fatty acids, total polyunsaturated g 24  Cholesterol mg 169     Chronic Kidney Disease Stage 5 (on Dialysis) Sample 1-Day Menu View Nutrient Info Breakfast 1 egg, hard-boiled 1 cup oatmeal  cup blueberries  cup soy yogurt 1 cup coffee  Lunch Sandwich made with: 2 slices whole wheat bread 2 ounces [place], sliced 2 leaves lettuce 1 teaspoon mustard 1 tablespoon mayonnaise 1 cup carrots, raw 1 apple 1 cup  water with lemon  Afternoon Snack 2 tablespoons peanut butter 4 celery sticks  Evening Meal 3 ounces fish, broiled  cup brown rice, cooked  cup green peppers, sauted  cup mushrooms, sauted 1 tablespoon olive oil  cup green beans  cup peaches  Evening Snack 3 cups popcorn, air-popped topped with: 1 teaspoon parmesan cheese 1 pear  Daily Sum Nutrient Unit Value  Macronutrients  Energy kcal 1713  Energy kJ 7170  Protein g 79  Total lipid (fat) g 64  Carbohydrate, by difference g 220  Fiber, total dietary g 36  Sugars, total g 84  Minerals  Calcium, Ca mg 664  Iron, Fe mg 11  Sodium, Na mg 835  Vitamins  Vitamin C, total ascorbic acid mg 100  Vitamin A, IU IU 22694  Vitamin D IU 91  Lipids  Fatty acids, total saturated g 12  Fatty acids, total monounsaturated g 27  Fatty acids, total polyunsaturated g 18  Cholesterol mg 278

## 2023-04-22 NOTE — Progress Notes (Signed)
Patient refused his VTE heparin at 0600

## 2023-04-22 NOTE — Plan of Care (Signed)
  Problem: Education: Goal: Ability to describe self-care measures that may prevent or decrease complications (Diabetes Survival Skills Education) will improve Outcome: Progressing Goal: Individualized Educational Video(s) Outcome: Progressing   

## 2023-04-23 ENCOUNTER — Other Ambulatory Visit (HOSPITAL_COMMUNITY): Payer: Self-pay

## 2023-04-23 ENCOUNTER — Inpatient Hospital Stay (HOSPITAL_COMMUNITY): Payer: Medicaid Other

## 2023-04-23 DIAGNOSIS — Z992 Dependence on renal dialysis: Secondary | ICD-10-CM | POA: Diagnosis not present

## 2023-04-23 DIAGNOSIS — R112 Nausea with vomiting, unspecified: Secondary | ICD-10-CM

## 2023-04-23 DIAGNOSIS — R109 Unspecified abdominal pain: Secondary | ICD-10-CM

## 2023-04-23 DIAGNOSIS — N186 End stage renal disease: Secondary | ICD-10-CM | POA: Diagnosis not present

## 2023-04-23 DIAGNOSIS — E8770 Fluid overload, unspecified: Secondary | ICD-10-CM | POA: Diagnosis not present

## 2023-04-23 LAB — RENAL FUNCTION PANEL
Albumin: <1.5 g/dL — ABNORMAL LOW (ref 3.5–5.0)
Anion gap: 11 (ref 5–15)
BUN: 37 mg/dL — ABNORMAL HIGH (ref 6–20)
CO2: 29 mmol/L (ref 22–32)
Calcium: 8.1 mg/dL — ABNORMAL LOW (ref 8.9–10.3)
Chloride: 95 mmol/L — ABNORMAL LOW (ref 98–111)
Creatinine, Ser: 7.23 mg/dL — ABNORMAL HIGH (ref 0.61–1.24)
GFR, Estimated: 10 mL/min — ABNORMAL LOW (ref >60)
Glucose, Bld: 160 mg/dL — ABNORMAL HIGH (ref 70–99)
Phosphorus: 6.4 mg/dL — ABNORMAL HIGH (ref 2.5–4.6)
Potassium: 4.3 mmol/L (ref 3.5–5.1)
Sodium: 135 mmol/L (ref 135–145)

## 2023-04-23 LAB — COMPREHENSIVE METABOLIC PANEL
ALT: 13 U/L (ref 0–44)
AST: 18 U/L (ref 15–41)
Albumin: 1.5 g/dL — ABNORMAL LOW (ref 3.5–5.0)
Alkaline Phosphatase: 76 U/L (ref 38–126)
Anion gap: 14 (ref 5–15)
BUN: 34 mg/dL — ABNORMAL HIGH (ref 6–20)
CO2: 28 mmol/L (ref 22–32)
Calcium: 8 mg/dL — ABNORMAL LOW (ref 8.9–10.3)
Chloride: 93 mmol/L — ABNORMAL LOW (ref 98–111)
Creatinine, Ser: 6.68 mg/dL — ABNORMAL HIGH (ref 0.61–1.24)
GFR, Estimated: 11 mL/min — ABNORMAL LOW (ref >60)
Glucose, Bld: 105 mg/dL — ABNORMAL HIGH (ref 70–99)
Potassium: 4.2 mmol/L (ref 3.5–5.1)
Sodium: 135 mmol/L (ref 135–145)
Total Bilirubin: 0.8 mg/dL (ref 0.3–1.2)
Total Protein: 6.1 g/dL — ABNORMAL LOW (ref 6.5–8.1)

## 2023-04-23 LAB — CBC
HCT: 28 % — ABNORMAL LOW (ref 39.0–52.0)
Hemoglobin: 8.4 g/dL — ABNORMAL LOW (ref 13.0–17.0)
MCH: 25.5 pg — ABNORMAL LOW (ref 26.0–34.0)
MCHC: 30 g/dL (ref 30.0–36.0)
MCV: 85.1 fL (ref 80.0–100.0)
Platelets: 441 10*3/uL — ABNORMAL HIGH (ref 150–400)
RBC: 3.29 MIL/uL — ABNORMAL LOW (ref 4.22–5.81)
RDW: 15.6 % — ABNORMAL HIGH (ref 11.5–15.5)
WBC: 6.9 10*3/uL (ref 4.0–10.5)
nRBC: 0 % (ref 0.0–0.2)

## 2023-04-23 LAB — LIPASE, BLOOD: Lipase: 68 U/L — ABNORMAL HIGH (ref 11–51)

## 2023-04-23 LAB — PROTIME-INR
INR: 1.3 — ABNORMAL HIGH (ref 0.8–1.2)
Prothrombin Time: 16.6 seconds — ABNORMAL HIGH (ref 11.4–15.2)

## 2023-04-23 LAB — GLUCOSE, CAPILLARY
Glucose-Capillary: 165 mg/dL — ABNORMAL HIGH (ref 70–99)
Glucose-Capillary: 100 mg/dL — ABNORMAL HIGH (ref 70–99)
Glucose-Capillary: 100 mg/dL — ABNORMAL HIGH (ref 70–99)
Glucose-Capillary: 169 mg/dL — ABNORMAL HIGH (ref 70–99)

## 2023-04-23 MED ORDER — HEPARIN SODIUM (PORCINE) 1000 UNIT/ML DIALYSIS: 1000.0000 [IU] | INTRAMUSCULAR | Status: DC | PRN

## 2023-04-23 MED ORDER — ENSURE ENLIVE PO LIQD
237.0000 mL | Freq: Two times a day (BID) | ORAL | 12 refills | Status: DC
  Filled 2023-04-23: qty 237, 1d supply, fill #0

## 2023-04-23 MED ORDER — TRIMETHOBENZAMIDE HCL 100 MG/ML IM SOLN: 200.0000 mg | Freq: Three times a day (TID) | INTRAMUSCULAR | Status: DC | PRN

## 2023-04-23 MED ORDER — ANTICOAGULANT SODIUM CITRATE 4% (200MG/5ML) IV SOLN
5.0000 mL | Status: DC | PRN
  Filled 2023-04-23: qty 5

## 2023-04-23 MED ORDER — ENSURE ENLIVE PO LIQD
237.0000 mL | Freq: Two times a day (BID) | ORAL | Status: DC
  Administered 2023-04-24: 237 mL via ORAL

## 2023-04-23 MED ORDER — SODIUM CHLORIDE 0.9 % IV SOLN
2.0000 g | INTRAVENOUS | Status: DC
  Administered 2023-04-23 – 2023-04-24 (×2): 2 g via INTRAVENOUS
  Filled 2023-04-23 (×3): qty 20

## 2023-04-23 MED ORDER — HEPARIN SODIUM (PORCINE) 1000 UNIT/ML DIALYSIS: 5000.0000 [IU] | INTRAMUSCULAR | Status: DC | PRN

## 2023-04-23 MED ORDER — PROCHLORPERAZINE MALEATE 5 MG PO TABS: 5.0000 mg | ORAL_TABLET | Freq: Four times a day (QID) | ORAL | Status: DC | PRN

## 2023-04-23 MED ORDER — CHLORHEXIDINE GLUCONATE CLOTH 2 % EX PADS: 6.0000 | MEDICATED_PAD | Freq: Every day | CUTANEOUS | Status: DC

## 2023-04-23 MED ORDER — PROCHLORPERAZINE MALEATE 5 MG PO TABS
5.0000 mg | ORAL_TABLET | Freq: Four times a day (QID) | ORAL | 0 refills | Status: DC | PRN
  Filled 2023-04-23: qty 20, 5d supply, fill #0

## 2023-04-23 NOTE — Progress Notes (Signed)
Received patient in bed to unit.  Alert and oriented.  Informed consent signed and in chart.   TX duration:1:32  Patient tolerated well. PT signed off AMA due to stomach pain. Provider Stovall notified. Transported back to the room  Alert, without acute distress.  Hand-off given to patient's nurse.   Access used: AVF Access issues: n/a  Total UF removed: 2.4L Medication(s) given: n/a W:127.5kg   [name]  04/23/23 1324  Vitals  Temp 97.8 F (36.6 C)  Temp Source Axillary  BP (!) 147/87  MAP (mmHg) 106  BP Location Right Arm  BP Method Automatic  Pulse Rate 91  ECG Heart Rate 92  Resp 15  Oxygen Therapy  SpO2 100 %  O2 Device Room Air  During Treatment Monitoring  Blood Flow Rate (mL/min) 0 mL/min  Arterial Pressure (mmHg) 7.07 mmHg  Venous Pressure (mmHg) 250.7 mmHg  TMP (mmHg) 24.04 mmHg  Ultrafiltration Rate (mL/min) 1933 mL/min  Dialysate Flow Rate (mL/min) 299 ml/min  Intra-Hemodialysis Comments Tx completed (patient signed off , AMA)  Dialysate Potassium Concentration 3  Dialysate Calcium Concentration 2.5  Duration of HD Treatment -hour(s) 1.54 hour(s)  Cumulative Fluid Removed (mL) per Treatment  2369.09  Post Treatment  Dialyzer Clearance Lightly streaked  Hemodialysis Intake (mL) 60 mL  Liters Processed 36.8  Fluid Removed (mL) 2400 mL  Tolerated HD Treatment Yes  Post-Hemodialysis Comments Patient signed off due to ABD pain  AVG/AVF Arterial Site Held (minutes) 5 minutes  AVG/AVF Venous Site Held (minutes) 5 minutes  Fistula / Graft Left Upper arm Arteriovenous fistula  Placement Date/Time: 03/04/21 1327   Placed prior to admission: No  Orientation: Left  Access Location: Upper arm  Access Type: (c) Arteriovenous fistula  Site Condition No complications  Fistula / Graft Assessment Present;Thrill;Bruit  Status Deaccessed

## 2023-04-23 NOTE — Plan of Care (Signed)
Discharge instructions discussed with patient.  Patient instructed on home medications, restrictions, and follow up appointments. Belongings gathered and sent with patient.  Patients medications at bedside from [facility]. Currently awaiting lab work to result prior to patient leaving hospital.

## 2023-04-23 NOTE — Discharge Summary (Incomplete)
Name: Bobby [name] MRN: [id number] DOB: [birth year] 27 y.o. PCP: [facility]  Date of Admission: 04/18/2023  1:40 PM Date of Discharge: 04/23/2023 Attending Physician: Dr. Heide Spark  Discharge Diagnosis: Principal Problem:   Volume overload Active Problems:   Anemia due to chronic kidney disease   Anasarca   Ascites   Hepatomegaly   Splenomegaly    Discharge Medications: Allergies as of 04/23/2023   No Known Allergies      Medication List     TAKE these medications    acetaminophen 325 MG tablet Commonly known as: TYLENOL Take 650 mg by mouth as needed for mild pain or moderate pain.   albuterol 108 (90 Base) MCG/ACT inhaler Commonly known as: VENTOLIN HFA Inhale 2 puffs into the lungs every 6 (six) hours as needed for wheezing.   Aranesp (Albumin Free) 150 MCG/0.3ML Sosy injection Generic drug: Darbepoetin Alfa Inject 0.3 mLs (150 mcg total) into the skin every Monday at 6 PM. Given with dialysis   carvedilol 25 MG tablet Commonly known as: COREG Take 1 tablet (25 mg total) by mouth 2 (two) times daily with a meal.   cinacalcet 30 MG tablet Commonly known as: SENSIPAR Take 30 mg by mouth daily.   cyclobenzaprine 10 MG tablet Commonly known as: FLEXERIL Take 1 tablet (10 mg total) by mouth 2 (two) times daily as needed for muscle spasms.   diclofenac Sodium 1 % Gel Commonly known as: VOLTAREN Apply 4 g topically 4 (four) times daily.   Entresto 49-51 MG Generic drug: sacubitril-valsartan Take 1 tablet by mouth 2 (two) times daily.   feeding supplement Liqd Take 237 mLs by mouth 2 (two) times daily between meals. Start taking on: April 24, 2023   gabapentin 100 MG capsule Commonly known as: NEURONTIN Take 100 mg by mouth 3 (three) times daily.   insulin aspart 100 UNIT/ML injection Commonly known as: novoLOG Inject 5 Units into the skin 3 (three) times daily before meals.   lanthanum 1000 MG chewable tablet Commonly  known as: FOSRENOL Chew 1,000 mg by mouth 5 (five) times daily.   lidocaine 5 % Commonly known as: LIDODERM Place 1 patch onto the skin daily. Remove & Discard patch within 12 hours or as directed by MD   prochlorperazine 5 MG tablet Commonly known as: COMPAZINE Take 1 tablet (5 mg total) by mouth every 6 (six) hours as needed for up to 5 days for nausea or vomiting.   sertraline 50 MG tablet Commonly known as: ZOLOFT Take 50 mg by mouth daily.   Velphoro 500 MG chewable tablet Generic drug: sucroferric oxyhydroxide Chew 500 mg by mouth 2 (two) times daily.        Disposition and follow-up:   Bobby [name] was discharged from [facility] in Stable condition.  At the hospital follow up visit please address:  1.  Follow-up:  a. For his ESRD,he got about 14.7 L of fluids out. His routine dialysis schedule is Tuesday, Thursday and Saturday . Kindly counsel on dialysis adherence     b. For his HTN, he is currently on Entresto 49-51 mg and Coreg 25 mg. Kindly follow up with BMP and CBC              c. There was concerns for chronic malnutrition. He albumin is less than 1.5. Nutrition has given the patient instructions on diet modification to help increase his protein intake. We are checking his INR. Kidney follow up with RUQ ultrasound  to further evaluate any hepatic etiology.    2.  Labs / imaging needed at time of follow-up: fasting glucose and A1C, RUQ U/S  3.  Pending labs/ test needing follow-up: BMP,CBC   4.  Medication Changes: No medication changes were made  Follow-up Appointments:  Follow-up Information     [facility] , [place]. Follow up in 2 week(s).   Contact information: [address] [place] [address] [phone]                 Hospital Course by problem list:  Volume overload ESRD on HD, TTS Anemia of chronic disease  HFrEF (EF 30-35%) Patient  a 27 year old male with past medical history of heart  failure reduced ejection fraction, end-stage renal disease on dialysis Tuesday Thursdays and Saturdays who presented to the emergency department due to shortness of breath and chest pain found to have volume overload in the setting of missing dialysis.  During this hospitalization, Bobby [name] was consulted for which she has had frequent dialysis during this hospitalization.  Due to concerns for recurrent ascites abdominal discomfort interventional radiology did 1 session of paracentesis.  Patient has had a total of 14.7 L fluid removed in this hospitalization.  His weight is down to 125 from 137 when he first came in.  He has remained stable over the past couple of days and we are discharging him today to follow-up with his home dialysis schedule.   Type 1 vs Type 2 Diabetes Mellitus Patient reported history of diabetes but uncertain if it is type I or type II.  Patient does not follow-up with an outpatient endocrinologist so it is unclear exactly what type of diabetes the patient has.During this hospitalization ,his glucose have been ranging between 85-160 on a SSI. (  CBG 70 - 120: 0 units CBG 121 - 150: 0 units CBG 151 - 200: 1 unit CBG 201-250: 2 units CBG 251-300: 3 units CBG 301-350: 4 units CBG 351-400: 5 units CBG > 400: Give 6 units)  To further clarify this diabetes with unspecified type, we sent for  anti-insulin, GAD65, IA2, and ZnT8 antibodies for which we are following up on.  We are discharging the patient today on his home insulin regimen,5 units of NovoLog 3 times daily   Hypertension Patients has a hx of hypertension,presented with a BP on coreg 25 mg, Entresto 49-51 mg.We will continue on this same regimen on discharge.     Discharge Subjective: Patient is laying in bed this morning, says he feel better and still don't need paracentesis at this time. He had just vomit ,says it was after eating his breakfast. Denies any abdominal discomfort ,SOB or any chest pain .  Discharge Exam:   BP  (!) 147/87 (BP Location: Right Arm)   Pulse 91   Temp 97.8 F (36.6 C) (Axillary)   Resp 15   Ht 6\' 5"  (1.956 m)   Wt 130.4 kg   SpO2 100%   BMI 34.09 kg/m    Constitutional: Obese appearing man, laying in led, NAD  HENT: normocephalic atraumatic, mucous membranes moist Cardiovascular: Mild crackles heard on exam  Pulmonary/Chest: on RA sating well.  Abdominal: soft, non-tender, non-distended Psych: Normal mood and affect    Pertinent Labs, Studies, and Procedures:     Latest Ref Rng & Units 04/23/2023    1:31 AM 04/22/2023    1:02 AM 04/21/2023    3:38 AM  CBC  WBC 4.0 - 10.5 K/uL 6.9  5.4  5.9   Hemoglobin 13.0 - 17.0 g/dL 8.4  7.8  7.9   Hematocrit 39.0 - 52.0 % 28.0  26.5  26.2   Platelets 150 - 400 K/uL 441  385  370        Latest Ref Rng & Units 04/23/2023    1:31 AM 04/22/2023    1:02 AM 04/21/2023    3:38 AM  CMP  Glucose 70 - 99 mg/dL 161  096  045   BUN 6 - 20 mg/dL 37  26  33   Creatinine 0.61 - 1.24 mg/dL 4.09  8.11  9.14   Sodium 135 - 145 mmol/L 135  136  136   Potassium 3.5 - 5.1 mmol/L 4.3  3.9  4.0   Chloride 98 - 111 mmol/L 95  93  91   CO2 22 - 32 mmol/L 29  31  33   Calcium 8.9 - 10.3 mg/dL 8.1  8.3  7.9     IR Paracentesis  Result Date: 04/19/2023 INDICATION: Congestive heart failure and end-stage renal disease on hemodialysis with ascites. Request for paracentesis. EXAM: ULTRASOUND GUIDED PARACENTESIS MEDICATIONS: 1% lidocaine 10 mL COMPLICATIONS: None immediate. PROCEDURE: Informed written consent was obtained from the patient after a discussion of the risks, benefits and alternatives to treatment. A timeout was performed prior to the initiation of the procedure. Initial ultrasound scanning demonstrates a large amount of ascites within the right lower abdominal quadrant. The right lower abdomen was prepped and draped in the usual sterile fashion. 1% lidocaine was used for local anesthesia. Following this, a 19 gauge, 7-cm, Yueh catheter was  introduced. An ultrasound image was saved for documentation purposes. The paracentesis was performed. The catheter was removed and a dressing was applied. The patient tolerated the procedure well without immediate post procedural complication. FINDINGS: A total of approximately 4 L of clear yellow fluid was removed. IMPRESSION: Successful ultrasound-guided paracentesis yielding 4 liters of peritoneal fluid. Procedure performed by: Alwyn Ren, NP Electronically Signed   By: Acquanetta Belling M.D.   On: 04/19/2023 16:15   DG Chest Port 1 View  Result Date: 04/18/2023 CLINICAL DATA:  Shortness of breath. EXAM: PORTABLE CHEST 1 VIEW COMPARISON:  04/01/2023 FINDINGS: There is marked enlargement of the cardiac silhouette which appears progressive when compared with study from 08/08/2022 and is new when compared with 06/05/2019. Imaging findings raise the concern for underlying pericardial effusion. Diminished aeration to the left base may reflect pleural effusion, atelectasis or infiltrate. Pulmonary vascular congestion is noted elsewhere in the lungs. IMPRESSION: 1. Marked enlargement of the cardiac silhouette which appears progressive when compared with study from 08/08/2022 and is new when compared with 06/05/2019. Imaging findings raise the concern for underlying pericardial effusion. 2. Diminished aeration to the left base may reflect pleural effusion, atelectasis or infiltrate. 3. Pulmonary vascular congestion. Electronically Signed   By: Signa Kell M.D.   On: 04/18/2023 15:19     Discharge Instructions: Dear Mr Bobby [name],   It was a pleasure taking care of you at [facility]. You were admitted for volume overload  and treated for acute on chronic heart failure . We are discharging you home now that you are doing better. Please follow the following instructions.  1) For your [facility] continue to follow up with your regular schedule Tuesdays, Thursdays and Friday 2) You also had chronic  malnutrition for which Nutrition spoke to you about and provided you with recommendations . Please read their instructions to help increase your patient  in take .  3) For your hypertension, we are sending you home the same medication we had you on in the hospital. Please take them as prescribed and follow up with your primary care physician. 4)Please follow up with your eye doctor when you leave the hospital.   Take care,  Dr. Kathleen Lime, MD    Discharge Instructions     Ambulatory referral to Physical Therapy   Complete by: As directed        Signed: Kathleen Lime, MD Internal Medicine Teaching Service Pager [phone]

## 2023-04-23 NOTE — TOC Transition Note (Signed)
Transition of Care [facility]) - CM/SW Discharge Note   Patient Details  Name: Bobby [name] MRN: [id number] Date of Birth: 23-Jan-1996  Transition of Care [facility]) CM/SW Contact:  Tom-Johnson, Hershal Coria, RN Phone Number: 04/23/2023, 2:28 PM   Clinical Narrative:     Patient is scheduled for discharge today.  Readmission Risk Assessment done. Outpatient referral, hospital f/u and discharge instructions on AVS. Prescriptions sent to [facility] pharmacy and meds will be delivered to patient at bedside prior discharge.  CM informed by patient's outpatient Case Manager of concerns for patient's Vision deficit and Ophthalmologist consult.  CM spoke with patient and patient denied denied any issues. 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 States his sister assists him at home and uses Psychologist, sport and exercise to and from dialysis.  MD notified of Ophthalmology request.     Sister, [name] to transport at discharge.  No further TOC needs noted.      Final next level of care: OP Rehab Barriers to Discharge: Barriers Resolved   Patient Goals and CMS Choice      Discharge Placement                  Patient to be transferred to facility by: Sister Name of family member notified: Rasheeda    Discharge Plan and Services Additional resources added to the After Visit Summary for                  DME Arranged: N/A DME Agency: NA       HH Arranged: NA HH Agency: NA        Social Determinants of Health (SDOH) Interventions SDOH Screenings   Food Insecurity: No Food Insecurity (04/19/2023)  Housing: Low Risk  (04/19/2023)  Transportation Needs: No Transportation Needs (04/19/2023)  Utilities: Not At Risk (04/19/2023)  Financial Resource Strain: Low Risk  (07/20/2022)   [facility] to [facility]/04[facility]22[facility] prior to  11/04/2022[facility]  Physical Activity: Insufficiently Active (07/20/2022)   [facility] prior to 11/04/2022., [facility] visits prior to 11/04/2022., [facility]  Social Connections: Unknown (07/20/2022)   [facility] visits prior to 11/04/2022., [facility] visits prior to 11/04/2022.  Stress: No Stress Concern Present (07/20/2022)   [facility] visits prior to 11/04/2022., [facility] visits prior to 11/04/2022., [facility]  Tobacco Use: Medium Risk (04/18/2023)     Readmission Risk Interventions    04/19/2023    3:26 PM 03/11/2021    3:57 PM  Readmission Risk Prevention Plan  Transportation Screening Complete Complete  PCP or Specialist Appt within 5-7 Days Complete Not Complete  Not Complete comments  03/29/21 is first available appt  Home Care Screening Complete Complete  Medication Review (RN CM) Referral to Pharmacy

## 2023-04-23 NOTE — Plan of Care (Signed)

## 2023-04-23 NOTE — Progress Notes (Addendum)
Subjective: 27 year old male hx of ESRD ,HFrEF, who presented to the ED due to shortness of breath and chest pain and found to have volume overload in the setting of acute on chronic heart failure due to missing dialysis.  This morning, the patient vomited after his breakfast. Vomit was non bloody, non bilious and had partially digested food   Patient had a recurrent episode of vomiting a  Overnight Event: No events overnight  Objective:  Vital signs in last 24 hours: Vitals:   04/23/23 1324 04/23/23 1353 04/23/23 1358 04/23/23 1502  BP: (!) 147/87 (!) 147/92  (!) 159/98  Pulse: 91 96  96  Resp: 15 16  18   Temp: 97.8 F (36.6 C)   98.3 F (36.8 C)  TempSrc: Axillary   Oral  SpO2: 100% 100%  98%  Weight:   127.5 kg   Height:       General: Laying comfortably in bed  Cardiology: Improved crackles noted on lung auscultation Psych:Normal  Mood and affect. Abdomen:Abdomen distension ,dull on percussion ,no guarding or rebound tenderness   Assessment/Plan:  Principal Problem:   Volume overload Active Problems:   Anemia due to chronic kidney disease   Anasarca   Ascites   Hepatomegaly   Splenomegaly  #Abdominal pain #Nausea and Vomiting Patient presented about 5 days ago with shortness of breath and chest pain and found to have volume overload in the setting of missing dialysis.  Had IR  do paracentesis on him due to abdominal discomfort and ascites.  Reported he was doing okay after the paracentesis and felt relieved. This morning, the patient vomited after his breakfast. Vomit was non bloody, non bilious and had partially digested food.On my afternoon visit to the patient, the nurse reported that patient had a large vomit again after his dialysis. Patient is complaining of a new onset of diffuse abdominal pain that he rates as a 12 out of 10.He has a history of abdominal cramping after dialysis but patient says this is a different type of pain. I wonder if this is spontaneous  bacterial peritonitis ,however an abdominal exam didn't  revel any signs guarding or rebound tenderness. He is not febrile but still has ascites present.Will consider  controlling the pain and get an imaging to further assess this new  onset of abdominal pain.  - Order CT Abd/Pelvis  wo contrast - IR paracentesis - IV Ceftriaxone    #Hypoalbuminemia ##Recurrent Ascites History of end-stage renal disease. Albumin of 1.5.Presented with a large to moderate ascites for which IR drained about 4L last week. Patient reports multiple history of ascites and abdominal distention and discomfort especially during non-dialysis days which is most likely due to low albumin. Patient denied any official diagnosis of liver disease. I will consult nutrition to further access his nutrition with the intent of increasing protein intake  -Follow-up with nutrition consult   #Acute hypoxemic respiratory failure 2/2 to HF exacerbation and volume overload #ESRD Patient has a history of end-stage renal disease currently being managed hemodialysis on Tuesdays Thursdays and Saturdays.  Presented with shortness of breath and chest pain ,found to have volume overload in the setting of missing dialysis.  Nephrology  is following.  Had dialysis today and had 2.4 L removed with a total of 16.7L removed during this hospitalization.Will consider dialysis him again tomorrow since he can't be discharged today due to this new onset abdominal pain.   #Hypertension Patient has a history of hypertension currently being managed with Coreg and  Entresto.  BP is 159/98. Will recheck his BP after dialysis today  - Continue current  GDMT  # Concerns for diabetes of unspecified type Patient reported history of diabetes but unsure if type I or type II at this time. Last A1c was 6.2 and glucose today is 165. Patient is however on no home regimen for glycemic control.Anti-insulin, GAD65, IA2, and ZnT8 antibodies sent, will follow-up with these  results and drop on appropriate glycemic control regimen.  Continue sliding scale insulin with meals as needed.  Prior to Admission Living Arrangement: Anticipated Discharge Location: Barriers to Discharge: Dispo: Anticipated discharge in approximately 2 day(s).   Kathleen Lime, MD 04/23/2023, 3:36 PM After 5pm on weekdays and 1pm on weekends: On Call pager [phone]

## 2023-04-23 NOTE — Progress Notes (Signed)
[facility] Progress Note   Subjective:   Seen in room - he had asked about extra HD today - if schedule allows. Plan is to discharge ?tomorrow. He says breathing is better.  ADDENDUM: Later seen on HD - they were able to bring him up early - short HD today, then d/c after.  Objective Vitals:   04/23/23 1144 04/23/23 1146 04/23/23 1209 04/23/23 1210  BP: (!) 150/79 (!) 149/78  133/76  Pulse: 90   91  Resp:    (!) 34  Temp: 98 F (36.7 C)     TempSrc: Oral     SpO2: 100% 97%  98%  Weight:   130.4 kg   Height:       Physical Exam General: Chornically ill appearing man, NAD. Nasal O2 in place Heart: RRR Lungs: CTA laterally Abdomen: distended, soft Extremities: 3+ BLE edema Dialysis Access:  L AVF  Additional Objective Labs: Basic Metabolic Panel: Recent Labs  Lab 04/21/23 0338 04/22/23 0102 04/23/23 0131  NA 136 136 135  K 4.0 3.9 4.3  CL 91* 93* 95*  CO2 33* 31 29  GLUCOSE 133* 138* 160*  BUN 33* 26* 37*  CREATININE 6.86* 5.71* 7.23*  CALCIUM 7.9* 8.3* 8.1*  PHOS 5.8* 5.3* 6.4*   Liver Function Tests: Recent Labs  Lab 04/18/23 2332 04/19/23 0448 04/20/23 0343 04/21/23 0338 04/22/23 0102 04/23/23 0131  AST 26 21 19   --   --   --   ALT 16 16 13   --   --   --   ALKPHOS 92 89 85  --   --   --   BILITOT 1.4* 1.1 1.2  --   --   --   PROT 7.9 6.8 6.3*  --   --   --   ALBUMIN 1.9* 1.7* 1.5* <1.5* <1.5* <1.5*   CBC: Recent Labs  Lab 04/18/23 1700 04/18/23 2340 04/20/23 0343 04/20/23 1419 04/21/23 0338 04/22/23 0102 04/23/23 0131  WBC 6.4   < > 5.4 5.1 5.9 5.4 6.9  NEUTROABS 4.5  --   --   --  4.0  --   --   HGB 8.5*   < > 8.3* 8.8* 7.9* 7.8* 8.4*  HCT 28.1*   < > 27.9* 29.8* 26.2* 26.5* 28.0*  MCV 85.9   < > 86.1 84.4 83.2 83.6 85.1  PLT 477*   < > 392 388 370 385 441*   < > = values in this interval not displayed.    Medications:  anticoagulant sodium citrate      carvedilol  25 mg Oral BID WC   Chlorhexidine Gluconate Cloth   6 each Topical Q0600   Chlorhexidine Gluconate Cloth  6 each Topical Q0600   diclofenac Sodium  4 g Topical QID   gabapentin  100 mg Oral TID   heparin  5,000 Units Subcutaneous Q8H   heparin sodium (porcine)  2,000 Units Intracatheter Once   insulin aspart  0-6 Units Subcutaneous TID WC   ipratropium-albuterol  3 mL Nebulization Once   lanthanum  1,000 mg Oral TID WC   melatonin  3 mg Oral QHS   sacubitril-valsartan  1 tablet Oral BID   sertraline  50 mg Oral Daily    Dialysis Orders: [facility] TThS F180, BFR 500, 4hr 15 min, 2 ca, 2K, edw 105.3kg (recent post weight 136kg), L AVF, mircera 150mg  last given 7/2, hectorol w/ hd, sensipar 30mg  daily, lanthanum 1 tab w/ meals  Assessment/Plan: #  ESRD: Profoundly volume overloaded, chronic issue. S/p HD 8/15, 8/16, 8/17. Able to do short-run tomorrow (8/20) - ok for discharge after. Resume regular OP HD tomorrow. Unfortunately, this has been a chronic issue for him - will keep counseling.  # Volume/ hypertension: EDW 105.3 kg but not even close in the outpatient setting.  Chronic issues with compliance.  Massively volume overloaded.    S/p paracentesis by IR 8/15.  Continue home blood pressure medications. Volume status has improved with serial dialysis/paracentesis. Weight is down to 125kg. This will be an ongoing issue.    # Anemia of Chronic Kidney Disease: Hemoglobin seems to be fluctuating. Has not received ESA recently.  Iron stores fairly adequate with ferritin of 941 recently. Hemoglobin trending down - ESA ordered 8/17.    # Secondary Hyperparathyroidism/Hyperphosphatemia: Hold home Sensipar given lower calcium.  Hectorol 10 mcg with dialysis.  Home lanthanum.   # Vascular access: Left upper extremity AVF with no issues   # Acute hypoxic respiratory failure: Related to volume overload in the setting of noncompliance with dialysis.  Requiring dialysis and has tachypnea.  Ultrafiltration as above.   # Recurrent ascites:  Mostly related to dialysis noncompliance.  S/p para in IR 8/15.    # Enlarged cardiac silhouette: Present on chest x-ray.  Repeat Echo 8/16 - EF improved to 50%,  pericardial effusion larger    Ozzie Hoyle, PA-C 04/23/2023, 12:28 PM  [facility]

## 2023-04-23 NOTE — Progress Notes (Signed)
Patient temperature was 99.5 at 0505, he was educated about the risks of a fever and refuses PRN tylenol. He also refuses the rest of his 0600 medications including heparin.

## 2023-04-23 NOTE — Progress Notes (Addendum)
D/C order noted. Contacted [facility] to advise clinic of pt's d/c today and that pt should resume care tomorrow.   Olivia Canter Renal Navigator [phone]  Addendum at 4:06 pm: Inquired of pt's RN if pt's d/c has been cancelled. Advised that d/c was cancelled. Contacted clinic to make them aware of this info and that pt will likely not be at clinic tomorrow.

## 2023-04-23 NOTE — Plan of Care (Signed)
[facility] Patient Discharge Orders - [facility] CLINIC: [facility]  Patient's name: Bobby [name] Admit/DC Dates: 04/18/2023 - 04/23/2023  DISCHARGE DIAGNOSES: Dyspnea/volume overload/pulmonary edema  Nephrogenic ascites s/p paracentesis 8/15 (4L off)   HD ORDER CHANGES: Heparin change: no EDW Change: no   *remains well above, weights here 137kg -> 127.5kg today [place] Change: no  ANEMIA MANAGEMENT: Aranesp: Given: yes   Amount/Date of last dose: Aranesp given 04/21/23 ESA dose for discharge: mircera 200 mcg IV q 2 weeks, to start on 04/28/23 IV Iron dose at discharge: per protocol Transfusion: Given: no  BONE/MINERAL MEDICATIONS: Hectorol/Calcitriol change: no Sensipar/Parsabiv change:  YES - sensipar held here d/t hypocalcemia - corrected is actually not terrible - ok to resume as outpatient  ACCESS INTERVENTION/CHANGE: no  RECENT LABS: Recent Labs  Lab 04/23/23 0131  HGB 8.4*  NA 135  K 4.3  CALCIUM 8.1*  PHOS 6.4*  ALBUMIN <1.5*    IV ANTIBIOTICS: no Details:  OTHER ANTICOAGULATION: On Coumadin?: no  OTHER/APPTS/LAB ORDERS:  - Check K, Ca, Alb, Hgb this week.  D/C Meds to be reconciled by nurse after every discharge.  Completed By: Ozzie Hoyle, PA-C  [facility] Pager [phone]    Reviewed by: MD:______ RN_______

## 2023-04-24 ENCOUNTER — Inpatient Hospital Stay (HOSPITAL_COMMUNITY): Payer: Medicaid Other

## 2023-04-24 DIAGNOSIS — E8770 Fluid overload, unspecified: Secondary | ICD-10-CM | POA: Diagnosis not present

## 2023-04-24 DIAGNOSIS — K7460 Unspecified cirrhosis of liver: Secondary | ICD-10-CM | POA: Diagnosis present

## 2023-04-24 HISTORY — PX: IR PARACENTESIS: IMG2679

## 2023-04-24 LAB — GRAM STAIN: Gram Stain: NONE SEEN

## 2023-04-24 LAB — BODY FLUID CELL COUNT WITH DIFFERENTIAL
Eos, Fluid: 0 %
Lymphs, Fluid: 38 %
Monocyte-Macrophage-Serous Fluid: 28 % — ABNORMAL LOW (ref 50–90)
Neutrophil Count, Fluid: 32 % — ABNORMAL HIGH (ref 0–25)
Other Cells, Fluid: 2 %
Total Nucleated Cell Count, Fluid: 97 cu mm (ref 0–1000)

## 2023-04-24 LAB — GLUCOSE, CAPILLARY
Glucose-Capillary: 130 mg/dL — ABNORMAL HIGH (ref 70–99)
Glucose-Capillary: 107 mg/dL — ABNORMAL HIGH (ref 70–99)
Glucose-Capillary: 134 mg/dL — ABNORMAL HIGH (ref 70–99)

## 2023-04-24 LAB — PROTEIN, PLEURAL OR PERITONEAL FLUID: Total protein, fluid: 3.6 g/dL

## 2023-04-24 LAB — RENAL FUNCTION PANEL
Albumin: <1.5 g/dL — ABNORMAL LOW (ref 3.5–5.0)
Anion gap: 14 (ref 5–15)
BUN: 39 mg/dL — ABNORMAL HIGH (ref 6–20)
CO2: 27 mmol/L (ref 22–32)
Calcium: 7.9 mg/dL — ABNORMAL LOW (ref 8.9–10.3)
Chloride: 94 mmol/L — ABNORMAL LOW (ref 98–111)
Creatinine, Ser: 7.13 mg/dL — ABNORMAL HIGH (ref 0.61–1.24)
GFR, Estimated: 10 mL/min — ABNORMAL LOW (ref >60)
Glucose, Bld: 131 mg/dL — ABNORMAL HIGH (ref 70–99)
Phosphorus: 6.5 mg/dL — ABNORMAL HIGH (ref 2.5–4.6)
Potassium: 4.2 mmol/L (ref 3.5–5.1)
Sodium: 135 mmol/L (ref 135–145)

## 2023-04-24 LAB — CBC
HCT: 25.7 % — ABNORMAL LOW (ref 39.0–52.0)
Hemoglobin: 7.7 g/dL — ABNORMAL LOW (ref 13.0–17.0)
MCH: 24.8 pg — ABNORMAL LOW (ref 26.0–34.0)
MCHC: 30 g/dL (ref 30.0–36.0)
MCV: 82.9 fL (ref 80.0–100.0)
Platelets: 444 10*3/uL — ABNORMAL HIGH (ref 150–400)
RBC: 3.1 MIL/uL — ABNORMAL LOW (ref 4.22–5.81)
RDW: 15.8 % — ABNORMAL HIGH (ref 11.5–15.5)
WBC: 6.5 10*3/uL (ref 4.0–10.5)
nRBC: 0 % (ref 0.0–0.2)

## 2023-04-24 LAB — ALBUMIN, PLEURAL OR PERITONEAL FLUID: Albumin, Fluid: <1.5 g/dL

## 2023-04-24 LAB — GLUCOSE, PLEURAL OR PERITONEAL FLUID: Glucose, Fluid: 118 mg/dL

## 2023-04-24 LAB — LACTATE DEHYDROGENASE, PLEURAL OR PERITONEAL FLUID: LD, Fluid: 240 U/L — ABNORMAL HIGH (ref 3–23)

## 2023-04-24 MED ORDER — LIDOCAINE HCL 1 % IJ SOLN
INTRAMUSCULAR | Status: AC
  Filled 2023-04-24: qty 20

## 2023-04-24 MED ORDER — HYDROXYZINE HCL 25 MG PO TABS
25.0000 mg | ORAL_TABLET | Freq: Three times a day (TID) | ORAL | Status: DC | PRN
  Administered 2023-04-24: 25 mg via ORAL
  Filled 2023-04-24: qty 1

## 2023-04-24 MED ORDER — CIPROFLOXACIN HCL 500 MG PO TABS: 500.0000 mg | ORAL_TABLET | Freq: Every day | ORAL | 0 refills | Status: DC

## 2023-04-24 MED ORDER — CINACALCET HCL 30 MG PO TABS
30.0000 mg | ORAL_TABLET | Freq: Every day | ORAL | Status: DC
  Filled 2023-04-24: qty 1

## 2023-04-24 MED ORDER — ALBUMIN HUMAN 25 % IV SOLN
50.0000 g | Freq: Once | INTRAVENOUS | Status: AC
  Administered 2023-04-24: 50 g via INTRAVENOUS
  Filled 2023-04-24: qty 200

## 2023-04-24 NOTE — Plan of Care (Signed)
  Problem: Health Behavior/Discharge Planning: Goal: Ability to manage health-related needs will improve Outcome: Progressing   Problem: Clinical Measurements: Goal: Ability to maintain clinical measurements within normal limits will improve Outcome: Progressing Goal: Diagnostic test results will improve Outcome: Progressing Goal: Respiratory complications will improve Outcome: Progressing   Problem: Activity: Goal: Risk for activity intolerance will decrease Outcome: Progressing   Problem: Nutrition: Goal: Adequate nutrition will be maintained Outcome: Progressing

## 2023-04-24 NOTE — Discharge Summary (Signed)
Name: Bobby [name] MRN: [id number] DOB: 1995-11-14 27 y.o. PCP: [facility]  Date of Admission: 04/18/2023  1:40 PM Date of Discharge: 04/24/23 Attending Physician: Dr. Heide Spark  Discharge Diagnosis: Principal Problem:   Volume overload Active Problems:   Anemia due to chronic kidney disease   Anasarca   Ascites   Hepatomegaly   Splenomegaly   Cirrhosis (HCC)    Discharge Medications: Allergies as of 04/24/2023   No Known Allergies      Medication List     TAKE these medications    acetaminophen 325 MG tablet Commonly known as: TYLENOL Take 650 mg by mouth as needed for mild pain or moderate pain.   albuterol 108 (90 Base) MCG/ACT inhaler Commonly known as: VENTOLIN HFA Inhale 2 puffs into the lungs every 6 (six) hours as needed for wheezing.   Aranesp (Albumin Free) 150 MCG/0.3ML Sosy injection Generic drug: Darbepoetin Alfa Inject 0.3 mLs (150 mcg total) into the skin every Monday at 6 PM. Given with dialysis   carvedilol 25 MG tablet Commonly known as: COREG Take 1 tablet (25 mg total) by mouth 2 (two) times daily with a meal.   cinacalcet 30 MG tablet Commonly known as: SENSIPAR Take 30 mg by mouth daily.   ciprofloxacin 500 MG tablet Commonly known as: Cipro Take 1 tablet (500 mg total) by mouth daily with breakfast for 3 days. Start taking on: April 25, 2023   cyclobenzaprine 10 MG tablet Commonly known as: FLEXERIL Take 1 tablet (10 mg total) by mouth 2 (two) times daily as needed for muscle spasms.   diclofenac Sodium 1 % Gel Commonly known as: VOLTAREN Apply 4 g topically 4 (four) times daily.   Entresto 49-51 MG Generic drug: sacubitril-valsartan Take 1 tablet by mouth 2 (two) times daily.   feeding supplement Liqd Take 237 mLs by mouth 2 (two) times daily between meals.   gabapentin 100 MG capsule Commonly known as: NEURONTIN Take 100 mg by mouth 3 (three) times daily.   insulin aspart 100 UNIT/ML  injection Commonly known as: novoLOG Inject 5 Units into the skin 3 (three) times daily before meals.   lanthanum 1000 MG chewable tablet Commonly known as: FOSRENOL Chew 1,000 mg by mouth 5 (five) times daily.   lidocaine 5 % Commonly known as: LIDODERM Place 1 patch onto the skin daily. Remove & Discard patch within 12 hours or as directed by MD   prochlorperazine 5 MG tablet Commonly known as: COMPAZINE Take 1 tablet (5 mg total) by mouth every 6 (six) hours as needed for up to 5 days for nausea or vomiting.   sertraline 50 MG tablet Commonly known as: ZOLOFT Take 50 mg by mouth daily.   Velphoro 500 MG chewable tablet Generic drug: sucroferric oxyhydroxide Chew 500 mg by mouth 2 (two) times daily.        Disposition and follow-up:   Mr.Bobby [name] was discharged from [facility] in Stable condition.  At the hospital follow up visit please address:  1.  Follow-up: A. Cirrhosis- NEW diagnosis. High concern for SBP during admission. PMNs 97 but this is after he got Abx dose. Please refer to GI for EGD and further evaluation of cirrhosis.   B. Diabetes- unsure if type 1 vs 2. Antibodies sent  C. Goals of care- overall I am concerned about his prognosis, as he has multi-organ failure. Would recommend helping patient with MOST form and engage with him about indications for palliative.  2.  Labs / imaging needed at time of follow-up: CBC, INR, CMP  3.  Pending labs/ test needing follow-up: culture ascitic fluid, diabetes antibodies  4.  Medication Changes Abx -  Ciprofloxacin 500 mg every day  End Date: 04/27/23  Follow-up Appointments:  Follow-up Information     [facility] [place], [place]. Follow up in 2 week(s).   Contact information: Bobby [name] [address] [phone]                 Hospital Course by problem list: Volume overload ESRD on HD, TTS Patient  a 27 year old male with past medical history of  end-stage renal disease on dialysis Tuesday Thursdays and Saturdays who presented to the emergency department due to shortness of breath and chest pain found to have volume overload in the setting of missing dialysis.  Patient has had a total of 14.7 L fluid removed in this hospitalization.  His weight is down to 125 from 137 when he first came in.  He has remained stable over the past couple of days and we are discharging him today to follow-up with his home dialysis schedule.   Cirrhosis Patient had received several paracentesis in outpatient setting with no known history of cirrhosis on chart review. He developed abdominal pain during admission and ceftriaxone was started with high concern for SBP. Ultrasound confirmed cirrhosis and INR elevated at 1.3. Abdominal pain improved and he was discharged with 3 days of ciprofloxacin 500 mg every day (renally dosed) to complete course. He will need follow-up with GI for further evaluation of cirrhosis.  HFrEF (EF 30-35%) Volume managed with HD. He was grossly volume overloaded on arrival requiring serial HD sessions.   Type 1 vs Type 2 Diabetes Mellitus Patient reported history of diabetes but uncertain if it is type I or type II.  Patient does not follow-up with an outpatient endocrinologist so it is unclear exactly what type of diabetes the patient has.  To further clarify this diabetes with unspecified type, we sent for  anti-insulin, GAD65, IA2, and ZnT8 antibodies for which we are following up on.  We are discharging the patient today on his home insulin regimen,5 units of NovoLog 3 times daily.   Anemia of chronic disease  Baseline hgb around 7-8. Labs consistent with anemia of chronic disease.  Hypertension Patients has a hx of hypertension. Home medications include coreg 25 mg, Entresto 49-51 mg.We will continue on this same regimen on discharge.    Discharge Subjective: Patient evaluated at bedside this during HD. He states that his son is in  town and he must leave after dialysis or will need to leave AMA. His abdominal pain has resolved. I reviewed importance of picking up antibiotic and completing remaining 3 day course. He has a PCP and will call them to schedule and appointment. We discussed need for GI follow-up through PCP to evaluate new diagnosis of cirrhosis.  Discharge Exam:   BP (!) 156/86   Pulse 100   Temp 98 F (36.7 C)   Resp (!) 33   Ht 6\' 5"  (1.956 m)   Wt 119.5 kg   SpO2 100%   BMI 31.24 kg/m  Constitutional: Chronically ill appearing, in no acute distress Neck: no JVD Cardiovascular: regular rate and rhythm, no m/r/g, Fistula to left upper extremity Pulmonary/Chest: normal work of breathing on room air, lungs clear to auscultation bilaterally Abdominal: soft, non-tender, distention improved from before paracentesis this morning MSK: 1+ pitting edema to mid shin bilaterally  Neurological: alert & oriented x 3 Skin: warm and dry  Pertinent Labs, Studies, and Procedures:     Latest Ref Rng & Units 04/24/2023    1:01 AM 04/23/2023    1:31 AM 04/22/2023    1:02 AM  CBC  WBC 4.0 - 10.5 K/uL 6.5  6.9  5.4   Hemoglobin 13.0 - 17.0 g/dL 7.7  8.4  7.8   Hematocrit 39.0 - 52.0 % 25.7  28.0  26.5   Platelets 150 - 400 K/uL 444  441  385        Latest Ref Rng & Units 04/24/2023    1:01 AM 04/23/2023    4:26 PM 04/23/2023    1:31 AM  CMP  Glucose 70 - 99 mg/dL 409  811  914   BUN 6 - 20 mg/dL 39  34  37   Creatinine 0.61 - 1.24 mg/dL 7.82  9.56  2.13   Sodium 135 - 145 mmol/L 135  135  135   Potassium 3.5 - 5.1 mmol/L 4.2  4.2  4.3   Chloride 98 - 111 mmol/L 94  93  95   CO2 22 - 32 mmol/L 27  28  29    Calcium 8.9 - 10.3 mg/dL 7.9  8.0  8.1   Total Protein 6.5 - 8.1 g/dL  6.1    Total Bilirubin 0.3 - 1.2 mg/dL  0.8    Alkaline Phos 38 - 126 U/L  76    AST 15 - 41 U/L  18    ALT 0 - 44 U/L  13      IR Paracentesis  Result Date: 04/19/2023 INDICATION: Congestive heart failure and end-stage renal  disease on hemodialysis with ascites. Request for paracentesis. EXAM: ULTRASOUND GUIDED PARACENTESIS MEDICATIONS: 1% lidocaine 10 mL COMPLICATIONS: None immediate. PROCEDURE: Informed written consent was obtained from the patient after a discussion of the risks, benefits and alternatives to treatment. A timeout was performed prior to the initiation of the procedure. Initial ultrasound scanning demonstrates a large amount of ascites within the right lower abdominal quadrant. The right lower abdomen was prepped and draped in the usual sterile fashion. 1% lidocaine was used for local anesthesia. Following this, a 19 gauge, 7-cm, Yueh catheter was introduced. An ultrasound image was saved for documentation purposes. The paracentesis was performed. The catheter was removed and a dressing was applied. The patient tolerated the procedure well without immediate post procedural complication. FINDINGS: A total of approximately 4 L of clear yellow fluid was removed. IMPRESSION: Successful ultrasound-guided paracentesis yielding 4 liters of peritoneal fluid. Procedure performed by: Alwyn Ren, NP Electronically Signed   By: Acquanetta Belling M.D.   On: 04/19/2023 16:15   DG Chest Port 1 View  Result Date: 04/18/2023 CLINICAL DATA:  Shortness of breath. EXAM: PORTABLE CHEST 1 VIEW COMPARISON:  04/01/2023 FINDINGS: There is marked enlargement of the cardiac silhouette which appears progressive when compared with study from 08/08/2022 and is new when compared with 06/05/2019. Imaging findings raise the concern for underlying pericardial effusion. Diminished aeration to the left base may reflect pleural effusion, atelectasis or infiltrate. Pulmonary vascular congestion is noted elsewhere in the lungs. IMPRESSION: 1. Marked enlargement of the cardiac silhouette which appears progressive when compared with study from 08/08/2022 and is new when compared with 06/05/2019. Imaging findings raise the concern for underlying pericardial  effusion. 2. Diminished aeration to the left base may reflect pleural effusion, atelectasis or infiltrate. 3. Pulmonary vascular congestion. Electronically Signed   By: Ladona Ridgel  Bradly Chris M.D.   On: 04/18/2023 15:19     Discharge Instructions: Discharge Instructions     Ambulatory referral to Ophthalmology   Complete by: As directed    Ambulatory referral to Physical Therapy   Complete by: As directed    Diet - low sodium heart healthy   Complete by: As directed    Discharge instructions   Complete by: As directed    Dear Mr Raines,   It was a pleasure taking care of you at [facility]. You were admitted for volume overload  and treated for acute on chronic heart failure. You were also found to have cirrhosis or liver scarring. With your abdominal pain I am worried the fluid in your belly was infected, which happens with cirrhosis. You are being discharged with 3 more days of antibiotics. Be sure to pick these up. Follow-up with your primary care doctor, you will likely need to see a GI doctor for your cirrhosis as there are some complications that can happen.  -start ciprofloxacin 500 mg daily for 3 days  I am glad you are feeling better, Dr. Sloan Leiter   Increase activity slowly   Complete by: As directed    No wound care   Complete by: As directed        Signed: Rudene Christians, DO 04/24/2023, 5:18 PM   Pager: [phone]

## 2023-04-24 NOTE — Procedures (Signed)
PROCEDURE SUMMARY:  Successful image-guided paracentesis from the right lower abdomen.  Yielded 8.2 liters of hazy amber fluid.  No immediate complications.  EBL = trace. Patient tolerated well.   Specimen was  sent for labs.  Please see imaging section of Epic for full dictation.   Lynann Bologna Jolicia Delira PA-C 04/24/2023 9:45 AM

## 2023-04-24 NOTE — Progress Notes (Signed)
[facility] Progress Note   Subjective:   Seen in room - discharge delayed yesterday after reporting diffuse abdominal pain, chills - worrisome for SBP. Blood Cx drawn, started IV abd - sched for another paracentesis today. Did have HD yesterday - net UF 2.4L (he signed off early - only ran 1.5 of 3 hours). He is hoping to be discharged today - will need to speak to his primary team. Today is his usual HD day - adding him to the schedule.  Objective Vitals:   04/24/23 0420 04/24/23 0432 04/24/23 0553 04/24/23 0938  BP: (!) 153/88   (!) 125/112  Pulse: (!) 106  100   Resp: 19     Temp: 100.3 F (37.9 C)  99.3 F (37.4 C)   TempSrc: Oral  Oral   SpO2: 96%  100%   Weight:  127.1 kg    Height:       Physical Exam General: Chornically ill appearing man, NAD.  Heart: RRR Lungs: CTAB Abdomen: distended, mildly tender across entire abdomen - no guarding or rebound tenderness Extremities: 3+ BLE edema Dialysis Access:  L AVF  Additional Objective Labs: Basic Metabolic Panel: Recent Labs  Lab 04/22/23 0102 04/23/23 0131 04/23/23 1626 04/24/23 0101  NA 136 135 135 135  K 3.9 4.3 4.2 4.2  CL 93* 95* 93* 94*  CO2 31 29 28 27   GLUCOSE 138* 160* 105* 131*  BUN 26* 37* 34* 39*  CREATININE 5.71* 7.23* 6.68* 7.13*  CALCIUM 8.3* 8.1* 8.0* 7.9*  PHOS 5.3* 6.4*  --  6.5*   Liver Function Tests: Recent Labs  Lab 04/19/23 0448 04/20/23 0343 04/21/23 0338 04/23/23 0131 04/23/23 1626 04/24/23 0101  AST 21 19  --   --  18  --   ALT 16 13  --   --  13  --   ALKPHOS 89 85  --   --  76  --   BILITOT 1.1 1.2  --   --  0.8  --   PROT 6.8 6.3*  --   --  6.1*  --   ALBUMIN 1.7* 1.5*   < > <1.5* 1.5* <1.5*   < > = values in this interval not displayed.   Recent Labs  Lab 04/23/23 1626  LIPASE 68*   CBC: Recent Labs  Lab 04/18/23 1700 04/18/23 2340 04/20/23 1419 04/21/23 0338 04/22/23 0102 04/23/23 0131 04/24/23 0101  WBC 6.4   < > 5.1 5.9 5.4 6.9 6.5   NEUTROABS 4.5  --   --  4.0  --   --   --   HGB 8.5*   < > 8.8* 7.9* 7.8* 8.4* 7.7*  HCT 28.1*   < > 29.8* 26.2* 26.5* 28.0* 25.7*  MCV 85.9   < > 84.4 83.2 83.6 85.1 82.9  PLT 477*   < > 388 370 385 441* 444*   < > = values in this interval not displayed.   Blood Culture    Component Value Date/Time   SDES BLOOD RIGHT FOREARM 04/23/2023 1626   SPECREQUEST  04/23/2023 1626    BOTTLES DRAWN AEROBIC AND ANAEROBIC Blood Culture adequate volume   CULT  04/23/2023 1626    NO GROWTH < 24 HOURS Performed at [facility], [address], [place], [place] [address]    REPTSTATUS PENDING 04/23/2023 1626   Studies/Results: US Abdomen Limited RUQ (LIVER/GB)  Result Date: 04/23/2023 CLINICAL DATA:  Abdominal pain EXAM: ULTRASOUND ABDOMEN LIMITED RIGHT UPPER QUADRANT COMPARISON:  CT today FINDINGS: Gallbladder: The previously seen layering gallstones on CT not visible by ultrasound. No wall thickening or sonographic Murphy sign. Common bile duct: Diameter: Normal caliber, 4 mm Liver: Subtle nodular contours to the liver surface compatible with cirrhosis. Portal vein is patent on color Doppler imaging with normal direction of blood flow towards the liver. Other: Perihepatic ascites noted. IMPRESSION: Mildly nodular contours of the liver surface compatible with cirrhosis. Associated ascites. Previously visualized gallstones by CT not visible by ultrasound. Electronically Signed   By: Charlett Nose M.D.   On: 04/23/2023 18:22   CT ABDOMEN PELVIS WO CONTRAST  Result Date: 04/23/2023 CLINICAL DATA:  Abdominal pain EXAM: CT ABDOMEN AND PELVIS WITHOUT CONTRAST TECHNIQUE: Multidetector CT imaging of the abdomen and pelvis was performed following the standard protocol without IV contrast. RADIATION DOSE REDUCTION: This exam was performed according to the departmental dose-optimization program which includes automated exposure control, adjustment of the mA and/or kV according to patient size and/or use of  iterative reconstruction technique. COMPARISON:  01/28/2023 FINDINGS: Lower chest: Cardiomegaly. Large pericardial effusion, increasing since prior study. Trace left pleural effusion. Predominantly linear densities in both lung bases, likely atelectasis. Hepatobiliary: Layering gallstones within the gallbladder. No visible biliary ductal dilatation or focal hepatic abnormality. Hepatomegaly with craniocaudal length of 22 cm. Pancreas: No focal abnormality or ductal dilatation. Spleen: Splenomegaly with craniocaudal length of 15.4 cm. Adrenals/Urinary Tract: Atrophic kidneys bilaterally. No stones or hydronephrosis. No visible renal or adrenal mass. Urinary bladder decompressed. Stomach/Bowel: Stomach, large and small bowel grossly unremarkable. Vascular/Lymphatic: No evidence of aneurysm or adenopathy. Reproductive: No visible focal abnormality. Other: Large volume ascites throughout the abdomen and pelvis, similar prior study. No free air. Musculoskeletal: No acute bony abnormality. Diffuse anasarca/edema throughout the subcutaneous soft tissues. IMPRESSION: Hepatosplenomegaly. Large volume ascites. Large pericardial effusion, increasing since prior study. Trace left pleural effusion. Cholelithiasis. Diffuse edema throughout the abdominal wall compatible with anasarca. Electronically Signed   By: Charlett Nose M.D.   On: 04/23/2023 18:21    Medications:  cefTRIAXone (ROCEPHIN)  IV 2 g (04/23/23 2114)    carvedilol  25 mg Oral BID WC   Chlorhexidine Gluconate Cloth  6 each Topical Q0600   Chlorhexidine Gluconate Cloth  6 each Topical Q0600   [START ON 04/25/2023] cinacalcet  30 mg Oral Q breakfast   diclofenac Sodium  4 g Topical QID   feeding supplement  237 mL Oral BID BM   gabapentin  100 mg Oral TID   heparin  5,000 Units Subcutaneous Q8H   heparin sodium (porcine)  2,000 Units Intracatheter Once   insulin aspart  0-6 Units Subcutaneous TID WC   ipratropium-albuterol  3 mL Nebulization Once    lanthanum  1,000 mg Oral TID WC   melatonin  3 mg Oral QHS   sacubitril-valsartan  1 tablet Oral BID   sertraline  50 mg Oral Daily   Dialysis Orders: [facility]Th[facility] F180, BFR 500, 4hr 15 min, 2 ca, 2K, edw 105.3kg (recent post weight 136kg), L AVF, mircera 150mg  last given 7/2, hectorol w/ hd, sensipar 30mg  daily, lanthanum 1 tab w/ meals   Assessment/Plan: # ESRD: Profoundly volume overloaded, chronic issue. S/p HD 8/15, 8/16, 8/17, 8/19. Due for HD on his usual schedule today - ordered.    # Volume/ hypertension: EDW 105.3 kg but not even close in the outpatient setting.  Chronic issues with compliance.  Massively volume overloaded.  S/p paracentesis by IR 8/15.  Volume status has improved  with serial dialysis/paracentesis 137kg -> 125kg. This will be an ongoing issue.    # Anemia of Chronic Kidney Disease: Hemoglobin seems to be fluctuating. Has not received ESA recently.  Iron stores fairly adequate with ferritin of 941 recently. Hemoglobin trending down - Aranesp given 8/17.    # Secondary Hyperparathyroidism/Hyperphosphatemia: Home Sensipar held due to hypocalcemia - when corrected for low albumin, it;s fine. Ok to resume on d/c. Continue   Hectorol 10 mcg q HD. Home lanthanum.   # Vascular access: Left upper extremity AVF with no issues   # Acute hypoxic respiratory failure: Related to volume overload in the setting of noncompliance with dialysis.  Requiring dialysis and has tachypnea.  Ultrafiltration as above.   # Recurrent ascites: Mostly related to dialysis noncompliance.  S/p para in IR 8/15.  Now with concern for possible SBP with new diffuse abd pain. Repeat paracentesis today - presuming with cell count, Cx?   # Enlarged cardiac silhouette: Present on chest x-ray.  Repeat Echo 8/16 - EF improved to 50%,  pericardial effusion larger  Ozzie Hoyle, PA-C 04/24/2023, 10:12 AM  [employer]

## 2023-04-24 NOTE — Progress Notes (Addendum)
Subjective:  27 year old male who presented to the ED due to shortness of breath and chest pain was likely due to missing dialysis and found to have volume overload in the setting of acute on chronic heart failure.   Overnight: Had dialysis yesterday. No other events overnight  This morning, pt report no abdominal pain, says he didn't have any complains today.He had about 8L of ascitic fluid removed via paracentesis with IR today. 50mg  of Albumin given after Para. Patient is scheduled for HD today.   Objective:  Vital signs in last 24 hours: Vitals:   04/24/23 0420 04/24/23 0432 04/24/23 0553 04/24/23 0938  BP: (!) 153/88   (!) 125/112  Pulse: (!) 106  100   Resp: 19     Temp: 100.3 F (37.9 C)  99.3 F (37.4 C)   TempSrc: Oral  Oral   SpO2: 96%  100%   Weight:  127.1 kg    Height:       General: Laying in bed , NAD Cardiology: Crackle heard on ascultation. Normal Rate and rhythm  Abd: Distended,dull to percussion.  MSK: Mild LE edema present   Assessment/Plan:  Principal Problem:   Volume overload Active Problems:   Anemia due to chronic kidney disease   Anasarca   Ascites   Hepatomegaly   Splenomegaly   Cirrhosis (HCC)  #Abdominal pain #Nausea and Vomiting #Cirrhosis #Recurrent Ascites Patient presented about 5 days ago with shortness of breath and chest pain and found to have volume overload in the setting of missing dialysis.  Had IR  do paracentesis on him due to abdominal discomfort and ascites.  Reported he was doing okay after the paracentesis and felt relieved. This morning, the patient vomited after his breakfast. Vomit was non bloody, non bilious and had partially digested food.On my afternoon visit to the patient, the nurse reported that patient had a large vomit again after his dialysis. Patient is complaining of a new onset of diffuse abdominal pain that he rates as a 12 out of 10.He has a history of abdominal cramping after dialysis but patient says this  is a different type of pain. I wonder if this is spontaneous bacterial peritonitis ,however an abdominal exam didn't  revel any signs guarding or rebound tenderness. He is not febrile at the time oif exam but spiked a fever the night before.To further access this new onset abdominal pain we ordered  CT Abdomen and Pelvis w/o contrast showed evidence of hepatosplenomegaly with layering gallstones within the gall bladder,larger pericardial effusion compared to prior,diffuse edema through out the abdominal wall consistent with anasarca  and also subtle nodular contours on the liver surface compatible with cirrhosis.This could explains why the patient has a very low albumin of < 1.5 and recurrent ascites.INR of 1.3 and PTT of 16.6 on labs today.This morning, he denies any abdominal pain.Ascites mildly improved. Had paracentesis,IR removed 8.2L and sent for culture.  - Give 50 g of Albumin ( 6g per liter removed ) - HD today - Follow up on Body fluid gram stain  - Continue IV Ceftriaxone with plans to switch oral abx on discharge ( maybe Cipro)    Volume overload ESRD on HD, TTS Anemia of chronic disease  HFrEF (EF 30-35%) Patient  a 27 year old male with past medical history of heart failure reduced ejection fraction, end-stage renal disease on dialysis Tuesday Thursdays and Saturdays who presented to the emergency department due to shortness of breath and chest pain found to have volume overload  in the setting of missing dialysis.  During this hospitalization, Nephrology was consulted for which he has had frequent dialysis during this hospitalization.  Due to concerns for recurrent ascites abdominal discomfort interventional radiology did 1 session of paracentesis.  Patient has had a total of 14.7 L fluid removed in this hospitalization.  His weight is down to 125 from 137 when he first came in.  - Continue Home dialysis schedule     Type 1 vs Type 2 Diabetes Mellitus Patient reported history of  diabetes but uncertain if it is type I or type II.  Patient does not follow-up with an outpatient endocrinologist so it is unclear exactly what type of diabetes the patient has.During this hospitalization ,his glucose have been ranging between 85-160 on a SSI. (  CBG 70 - 120: 0 units CBG 121 - 150: 0 units CBG 151 - 200: 1 unit CBG 201-250: 2 units CBG 251-300: 3 units CBG 301-350: 4 units CBG 351-400: 5 units CBG > 400: Give 6 units)  To further clarify this diabetes with unspecified type, we sent for  anti-insulin, GAD65, IA2, and ZnT8 antibodies for which we are following up on. Patient will continue on SSI insuline with meals for now.    Hypertension Patients has a hx of hypertension,presented with a BP on coreg 25 mg, Entresto 49-51 mg.We will continue on this same regimen on discharge.   Prior to Admission Living Arrangement: Anticipated Discharge Location: Barriers to Discharge: Dispo: Anticipated discharge in approximately 2 day(s).   Kathleen Lime, MD 04/24/2023, 1:52 PM Pager: @MYPAGER @ After 5pm on weekdays and 1pm on weekends: On Call pager [phone]

## 2023-04-24 NOTE — Progress Notes (Signed)
Pt refused CHG bath and standing daily weight. This care RN educated patient on importance of both interventions but patient continued to decline it. On call provider made aware with no new orders.

## 2023-04-24 NOTE — Evaluation (Signed)
Occupational Therapy Evaluation Patient Details Name: Bobby [name] MRN: [id number] DOB: 06/12/1996 Today's Date: 04/24/2023   History of Present Illness Pt is 27 y.o. male admitted 04/18/2023 with worsening SOB. Workup for volume overload secondary to acute on chronic HF. Of note, recent admissions to [facility] 03/2023, then to [facility] 04/01/23 with similar issue missing HD appt. PMH includes ESRD (HD TTS), HFrEF, type 1 diabetes, HTN, obesity.   Clinical Impression   Pt is at baseline Mod I - set up with ADLs and ADL mobility using no AD. PTA pt lived at home with is sister and required intermittently use of RW for mobility and for ADLs on HD days per pt report. Pt eager to d/c home. All education completed and no further acute OT services are indicated at this time. OT will sign off      If plan is discharge home, recommend the following:      Functional Status Assessment  Patient has not had a recent decline in their functional status  Equipment Recommendations  BSC/3in1    Recommendations for Other Services       Precautions / Restrictions Precautions Precautions: Fall Restrictions Weight Bearing Restrictions: No      Mobility Bed Mobility                    Transfers                          Balance Overall balance assessment: Mild deficits observed, not formally tested Sitting-balance support: Feet supported, No upper extremity supported Sitting balance-Leahy Scale: Good     Standing balance support: No upper extremity supported, During functional activity Standing balance-Leahy Scale: Good                             ADL either performed or assessed with clinical judgement   ADL Overall ADL's : Needs assistance/impaired                                       General ADL Comments: pt Mod I - set up at baseline. pt reports that on HD days that he does sometimes need assist due to not feeling well      Vision Ability to See in Adequate Light: 0 Adequate Patient Visual Report: No change from baseline       Perception         Praxis         Pertinent Vitals/Pain Pain Assessment Pain Assessment: 0-10 Pain Score: 3  Pain Location: abdomen, bilateral lower legs Pain Descriptors / Indicators: Heaviness, Sore Pain Intervention(s): Monitored during session, Repositioned     Extremity/Trunk Assessment Upper Extremity Assessment Upper Extremity Assessment: Overall WFL for tasks assessed   Lower Extremity Assessment Lower Extremity Assessment: Defer to PT evaluation       Communication Communication Communication: No apparent difficulties   Cognition Arousal: Alert Behavior During Therapy: WFL for tasks assessed/performed Overall Cognitive Status: Within Functional Limits for tasks assessed                                       General Comments       Exercises     Shoulder Instructions  Home Living Family/patient expects to be discharged to:: Private residence Living Arrangements: Other relatives Available Help at Discharge: Family;Available PRN/intermittently Type of Home: House Home Access: Stairs to enter [employer] of Steps: 1 Entrance Stairs-Rails: None Home Layout: One level     Bathroom Shower/Tub: Chief Strategy Officer: Standard     Home Equipment: Tub Administrator (2 wheels)          Prior Functioning/Environment Prior Level of Function : Independent/Modified Independent             Mobility Comments: typically independent without DME; was using RW initially after recent admission but has not needed as much recently. was supposed to start with outpatient PT, but missed appt due to hospital admission. does not get out much. ADLs Comments: independent with ADLs; reports some difficulty standing from low toilet height. sister assists with iADLs as needed. pt does not drive         OT Problem List: Impaired balance (sitting and/or standing);Pain;Decreased activity tolerance;Decreased strength;Decreased knowledge of use of DME or AE      OT Treatment/Interventions: Therapeutic activities    OT Goals(Current goals can be found in the care plan section) Acute Rehab OT Goals Patient Stated Goal: go home  OT Frequency:      Co-evaluation              AM-PAC OT "6 Clicks" Daily Activity     Outcome Measure Help from another person eating meals?: None Help from another person taking care of personal grooming?: None Help from another person toileting, which includes using toliet, bedpan, or urinal?: None Help from another person bathing (including washing, rinsing, drying)?: None Help from another person to put on and taking off regular upper body clothing?: None Help from another person to put on and taking off regular lower body clothing?: None 6 Click Score: 24   End of Session    Activity Tolerance: Patient tolerated treatment well Patient left: in bed;with call bell/phone within reach  OT Visit Diagnosis: Unsteadiness on feet (R26.81);Muscle weakness (generalized) (M62.81);Pain Pain - part of body: Leg (abdomen)                Time: 5284-1324 OT Time Calculation (min): 16 min Charges:  OT General Charges $OT Visit: 1 Visit OT Evaluation $OT Eval Low Complexity: 1 Low   Galen Manila 04/24/2023, 4:03 PM

## 2023-04-25 ENCOUNTER — Other Ambulatory Visit (HOSPITAL_COMMUNITY): Payer: Self-pay

## 2023-04-25 DIAGNOSIS — E8770 Fluid overload, unspecified: Secondary | ICD-10-CM | POA: Diagnosis not present

## 2023-04-25 LAB — CBC
HCT: 24.5 % — ABNORMAL LOW (ref 39.0–52.0)
Hemoglobin: 7.3 g/dL — ABNORMAL LOW (ref 13.0–17.0)
MCH: 25 pg — ABNORMAL LOW (ref 26.0–34.0)
MCHC: 29.8 g/dL — ABNORMAL LOW (ref 30.0–36.0)
MCV: 83.9 fL (ref 80.0–100.0)
Platelets: 430 10*3/uL — ABNORMAL HIGH (ref 150–400)
RBC: 2.92 MIL/uL — ABNORMAL LOW (ref 4.22–5.81)
RDW: 15.7 % — ABNORMAL HIGH (ref 11.5–15.5)
WBC: 6.3 10*3/uL (ref 4.0–10.5)
nRBC: 0 % (ref 0.0–0.2)

## 2023-04-25 LAB — RENAL FUNCTION PANEL
Albumin: 1.6 g/dL — ABNORMAL LOW (ref 3.5–5.0)
Anion gap: 12 (ref 5–15)
BUN: 39 mg/dL — ABNORMAL HIGH (ref 6–20)
CO2: 27 mmol/L (ref 22–32)
Calcium: 7.9 mg/dL — ABNORMAL LOW (ref 8.9–10.3)
Chloride: 98 mmol/L (ref 98–111)
Creatinine, Ser: 6.81 mg/dL — ABNORMAL HIGH (ref 0.61–1.24)
GFR, Estimated: 11 mL/min — ABNORMAL LOW (ref >60)
Glucose, Bld: 177 mg/dL — ABNORMAL HIGH (ref 70–99)
Phosphorus: 6.7 mg/dL — ABNORMAL HIGH (ref 2.5–4.6)
Potassium: 4.5 mmol/L (ref 3.5–5.1)
Sodium: 137 mmol/L (ref 135–145)

## 2023-04-25 LAB — GLUCOSE, CAPILLARY
Glucose-Capillary: 165 mg/dL — ABNORMAL HIGH (ref 70–99)
Glucose-Capillary: 131 mg/dL — ABNORMAL HIGH (ref 70–99)
Glucose-Capillary: 113 mg/dL — ABNORMAL HIGH (ref 70–99)

## 2023-04-25 LAB — GLUTAMIC ACID DECARBOXYLASE AUTO ABS: Glutamic Acid Decarb Ab: <5 U/mL (ref 0.0–5.0)

## 2023-04-25 LAB — CYTOLOGY - NON PAP

## 2023-04-25 MED ORDER — SODIUM CHLORIDE 0.9 % IV SOLN
2.0000 g | INTRAVENOUS | Status: DC
  Administered 2023-04-25: 2 g via INTRAVENOUS
  Filled 2023-04-25: qty 20

## 2023-04-25 MED ORDER — CIPROFLOXACIN HCL 500 MG PO TABS
500.0000 mg | ORAL_TABLET | Freq: Every day | ORAL | 0 refills | Status: DC
Start: 2023-04-25 — End: 2023-04-25
  Filled 2023-04-25: qty 4, 4d supply, fill #0

## 2023-04-25 MED ORDER — CYCLOBENZAPRINE HCL 10 MG PO TABS: 10.0000 mg | ORAL_TABLET | Freq: Two times a day (BID) | ORAL | Status: DC

## 2023-04-25 MED ORDER — CIPROFLOXACIN HCL 500 MG PO TABS
500.0000 mg | ORAL_TABLET | Freq: Every day | ORAL | 0 refills | Status: AC
  Filled 2023-04-25: qty 4, 4d supply, fill #0

## 2023-04-25 NOTE — Progress Notes (Signed)
[facility] Progress Note   Subjective:  Seen in room - still here, but finally going home today. Breathing ok.  Objective Vitals:   04/24/23 1948 04/25/23 0500 04/25/23 0603 04/25/23 0911  BP: (!) 152/86  (!) 145/79 (!) 142/75  Pulse: (!) 101  97 91  Resp: 18  19   Temp: 98.8 F (37.1 C)  98.5 F (36.9 C) 98 F (36.7 C)  TempSrc:    Oral  SpO2: 95%  92% 98%  Weight:  119.5 kg    Height:       Physical Exam General: Chronically ill appearing man, NAD.  Heart: RRR Lungs: CTAB Abdomen: distended, soft, non-tender Extremities: 2+ BLE edema Dialysis Access:  L AVF  Additional Objective Labs: Basic Metabolic Panel: Recent Labs  Lab 04/23/23 0131 04/23/23 1626 04/24/23 0101 04/25/23 0147  NA 135 135 135 137  K 4.3 4.2 4.2 4.5  CL 95* 93* 94* 98  CO2 29 28 27 27   GLUCOSE 160* 105* 131* 177*  BUN 37* 34* 39* 39*  CREATININE 7.23* 6.68* 7.13* 6.81*  CALCIUM 8.1* 8.0* 7.9* 7.9*  PHOS 6.4*  --  6.5* 6.7*   Liver Function Tests: Recent Labs  Lab 04/19/23 0448 04/20/23 0343 04/21/23 0338 04/23/23 1626 04/24/23 0101 04/25/23 0147  AST 21 19  --  18  --   --   ALT 16 13  --  13  --   --   ALKPHOS 89 85  --  76  --   --   BILITOT 1.1 1.2  --  0.8  --   --   PROT 6.8 6.3*  --  6.1*  --   --   ALBUMIN 1.7* 1.5*   < > 1.5* <1.5* 1.6*   < > = values in this interval not displayed.   Recent Labs  Lab 04/23/23 1626  LIPASE 68*   CBC: Recent Labs  Lab 04/18/23 1700 04/18/23 2340 04/21/23 0338 04/22/23 0102 04/23/23 0131 04/24/23 0101 04/25/23 0147  WBC 6.4   < > 5.9 5.4 6.9 6.5 6.3  NEUTROABS 4.5  --  4.0  --   --   --   --   HGB 8.5*   < > 7.9* 7.8* 8.4* 7.7* 7.3*  HCT 28.1*   < > 26.2* 26.5* 28.0* 25.7* 24.5*  MCV 85.9   < > 83.2 83.6 85.1 82.9 83.9  PLT 477*   < > 370 385 441* 444* 430*   < > = values in this interval not displayed.   Blood Culture    Component Value Date/Time   SDES PERITONEAL 04/24/2023 1040   SDES PERITONEAL  04/24/2023 1040   SPECREQUEST ABDOMEN 04/24/2023 1040   SPECREQUEST ABDOMEN 04/24/2023 1040   CULT  04/24/2023 1040    NO GROWTH < 24 HOURS Performed at [facility], [address], [place], [place] [address]    REPTSTATUS PENDING 04/24/2023 1040   REPTSTATUS 04/24/2023 FINAL 04/24/2023 1040   Studies/Results: IR Paracentesis  Result Date: 04/24/2023 INDICATION: 32 male with history of congestive heart failure, ESRD and recurrent ascites presents with abdominal distension. Request for therapeutic and diagnostic paracentesis. EXAM: ULTRASOUND GUIDED  PARACENTESIS MEDICATIONS: 10 mL 1% lidocaine COMPLICATIONS: None immediate. PROCEDURE: Informed written consent was obtained from the patient after a discussion of the risks, benefits and alternatives to treatment. A timeout was performed prior to the initiation of the procedure. Initial ultrasound scanning demonstrates a large amount of ascites within the right lower  abdominal quadrant. The right lower abdomen was prepped and draped in the usual sterile fashion. 1% lidocaine was used for local anesthesia. Following this, a 19 gauge, 7-cm, Yueh catheter was introduced. An ultrasound image was saved for documentation purposes. The paracentesis was performed. The catheter was removed and a dressing was applied. The patient tolerated the procedure well without immediate post procedural complication. Delete FINDINGS: A total of approximately 8.2 L of hazy yellow fluid was removed. Samples were sent to the laboratory as requested by the clinical team. IMPRESSION: Successful ultrasound-guided paracentesis yielding 8.2 liters of peritoneal fluid. Performed by: Lawernce Ion, PA-C Electronically Signed   By: Gilmer Mor D.O.   On: 04/24/2023 12:07   US Abdomen Limited RUQ (LIVER/GB)  Result Date: 04/23/2023 CLINICAL DATA:  Abdominal pain EXAM: ULTRASOUND ABDOMEN LIMITED RIGHT UPPER QUADRANT COMPARISON:  CT today FINDINGS: Gallbladder: The  previously seen layering gallstones on CT not visible by ultrasound. No wall thickening or sonographic Murphy sign. Common bile duct: Diameter: Normal caliber, 4 mm Liver: Subtle nodular contours to the liver surface compatible with cirrhosis. Portal vein is patent on color Doppler imaging with normal direction of blood flow towards the liver. Other: Perihepatic ascites noted. IMPRESSION: Mildly nodular contours of the liver surface compatible with cirrhosis. Associated ascites. Previously visualized gallstones by CT not visible by ultrasound. Electronically Signed   By: Charlett Nose M.D.   On: 04/23/2023 18:22   CT ABDOMEN PELVIS WO CONTRAST  Result Date: 04/23/2023 CLINICAL DATA:  Abdominal pain EXAM: CT ABDOMEN AND PELVIS WITHOUT CONTRAST TECHNIQUE: Multidetector CT imaging of the abdomen and pelvis was performed following the standard protocol without IV contrast. RADIATION DOSE REDUCTION: This exam was performed according to the departmental dose-optimization program which includes automated exposure control, adjustment of the mA and/or kV according to patient size and/or use of iterative reconstruction technique. COMPARISON:  01/28/2023 FINDINGS: Lower chest: Cardiomegaly. Large pericardial effusion, increasing since prior study. Trace left pleural effusion. Predominantly linear densities in both lung bases, likely atelectasis. Hepatobiliary: Layering gallstones within the gallbladder. No visible biliary ductal dilatation or focal hepatic abnormality. Hepatomegaly with craniocaudal length of 22 cm. Pancreas: No focal abnormality or ductal dilatation. Spleen: Splenomegaly with craniocaudal length of 15.4 cm. Adrenals/Urinary Tract: Atrophic kidneys bilaterally. No stones or hydronephrosis. No visible renal or adrenal mass. Urinary bladder decompressed. Stomach/Bowel: Stomach, large and small bowel grossly unremarkable. Vascular/Lymphatic: No evidence of aneurysm or adenopathy. Reproductive: No visible focal  abnormality. Other: Large volume ascites throughout the abdomen and pelvis, similar prior study. No free air. Musculoskeletal: No acute bony abnormality. Diffuse anasarca/edema throughout the subcutaneous soft tissues. IMPRESSION: Hepatosplenomegaly. Large volume ascites. Large pericardial effusion, increasing since prior study. Trace left pleural effusion. Cholelithiasis. Diffuse edema throughout the abdominal wall compatible with anasarca. Electronically Signed   By: Charlett Nose M.D.   On: 04/23/2023 18:21   Medications:  cefTRIAXone (ROCEPHIN)  IV      carvedilol  25 mg Oral BID WC   Chlorhexidine Gluconate Cloth  6 each Topical Q0600   Chlorhexidine Gluconate Cloth  6 each Topical Q0600   cinacalcet  30 mg Oral Q breakfast   diclofenac Sodium  4 g Topical QID   feeding supplement  237 mL Oral BID BM   gabapentin  100 mg Oral TID   heparin  5,000 Units Subcutaneous Q8H   heparin sodium (porcine)  2,000 Units Intracatheter Once   insulin aspart  0-6 Units Subcutaneous TID WC   ipratropium-albuterol  3 mL Nebulization Once  lanthanum  1,000 mg Oral TID WC   melatonin  3 mg Oral QHS   sacubitril-valsartan  1 tablet Oral BID   sertraline  50 mg Oral Daily    Dialysis Orders: [facility] TThS F180, BFR 500, 4hr 15 min, 2 ca, 2K, edw 105.3kg (recent post weight 136kg), L AVF, mircera 150mg  last given 7/2, hectorol w/ hd, sensipar 30mg  daily, lanthanum 1 tab w/ meals   Assessment/Plan: # ESRD: Profoundly volume overloaded, chronic issue. S/p HD 8/15, 8/16, 8/17, 8/19, 8/20.   # Volume/ hypertension: EDW 105.3 kg but not even close in the outpatient setting.  Chronic issues with compliance.  Massively volume overloaded.  S/p paracentesis by IR 8/15.  Volume status has improved with serial dialysis/paracentesis 137kg -> 119kg. This will be an ongoing issue.    # Anemia of Chronic Kidney Disease: Hgb trending down. Iron stores fairly adequate with ferritin of 941 recently. Aranesp  given 8/17.    # Secondary Hyperparathyroidism/Hyperphosphatemia: Home Sensipar held due to hypocalcemia - when corrected for low albumin, it's fine. Ok to resume on d/c. Continue   Hectorol 10 mcg q HD. Home lanthanum.   # Vascular access: Left upper extremity AVF with no issues   # Acute hypoxic respiratory failure: Related to volume overload in the setting of noncompliance with dialysis.  Requiring dialysis and has tachypnea.  Ultrafiltration as above.   # Recurrent ascites: Mostly related to dialysis noncompliance.  S/p para in IR 8/15. Developed acute abd pain - concern for SBP, s/p paracentesis - cell count < 100, Gram stain -, Cx negative so far -, on empiric abx   # Enlarged cardiac silhouette: Present on chest x-ray.  Repeat Echo 8/16 - EF improved to 50%,  pericardial effusion larger  Ozzie Hoyle, PA-C 04/25/2023, 10:31 AM  [employer]

## 2023-04-25 NOTE — TOC Transition Note (Signed)
Transition of Care [facility]) - CM/SW Discharge Note   Patient Details  Name: Bobby [name] MRN: [id number] Date of Birth: 1995/12/13  Transition of [facility]) CM/SW Contact:  Tom-Johnson, Hershal Coria, RN Phone Number: 04/25/2023, 5:04 PM   Clinical Narrative:     Patient discharging home today. Sister transporting.  No further TOC needs noted.       Final next level of care: OP Rehab Barriers to Discharge: Barriers Resolved   Patient Goals and CMS Choice      Discharge Placement                  Patient to be transferred to facility by: Sister Name of family member notified: Rasheeda    Discharge Plan and Services Additional resources added to the After Visit Summary for                  DME Arranged: N/A DME Agency: NA       HH Arranged: NA HH Agency: NA        Social Determinants of Health (SDOH) Interventions SDOH Screenings   Food Insecurity: No Food Insecurity (04/19/2023)  Housing: Low Risk  (04/19/2023)  Transportation Needs: No Transportation Needs (04/19/2023)  Utilities: Not At Risk (04/19/2023)  Financial Resource Strain: Low Risk  (07/20/2022)   [facility]04[facility] visits prior to 11/04/2022., [facility]  Physical Activity: Insufficiently Active (07/20/2022)   [facility] visits prior to 11/04/2022., [facility] visits prior to 11/04/2022., [facility]  Social Connections: Unknown (07/20/2022)   [facility] prior to 11/04/2022., [facility] visits prior to 11/04/2022.  Stress: No Stress Concern Present (07/20/2022)   [facility], [facility] visits prior to 11/04/2022., [facility] visits prior to 11/04/2022., [facility]  Tobacco Use: Medium Risk  (04/18/2023)     Readmission Risk Interventions    04/19/2023    3:26 PM 03/11/2021    3:57 PM  Readmission Risk Prevention Plan  Transportation Screening Complete Complete  PCP or Specialist Appt within 5-7 Days Complete Not Complete  Not Complete comments  03/29/21 is first available appt  Home Care Screening Complete Complete  Medication Review (RN CM) Referral to Pharmacy

## 2023-04-25 NOTE — Progress Notes (Signed)
D/C order noted. Contacted [facility] to advise clinic of pt's d/c today and that pt should resume care tomorrow.   Olivia Canter Renal Navigator [phone]

## 2023-04-25 NOTE — Discharge Summary (Addendum)
Name: Bobby [name] MRN: [id number] DOB: July 31, 1996 27 y.o. PCP: [facility]  Date of Admission: 04/18/2023  1:40 PM Date of Discharge: 04/25/23 Attending Physician: Dr. Heide Spark  Discharge Diagnosis: Principal Problem:   Volume overload Active Problems:   Anemia due to chronic kidney disease   Anasarca   Ascites   Hepatomegaly   Splenomegaly   Cirrhosis (HCC)    Discharge Medications: Allergies as of 04/25/2023   No Known Allergies      Medication List     TAKE these medications    acetaminophen 325 MG tablet Commonly known as: TYLENOL Take 650 mg by mouth as needed for mild pain or moderate pain.   albuterol 108 (90 Base) MCG/ACT inhaler Commonly known as: VENTOLIN HFA Inhale 2 puffs into the lungs every 6 (six) hours as needed for wheezing.   Aranesp (Albumin Free) 150 MCG/0.3ML Sosy injection Generic drug: Darbepoetin Alfa Inject 0.3 mLs (150 mcg total) into the skin every Monday at 6 PM. Given with dialysis   carvedilol 25 MG tablet Commonly known as: COREG Take 1 tablet (25 mg total) by mouth 2 (two) times daily with a meal.   cinacalcet 30 MG tablet Commonly known as: SENSIPAR Take 30 mg by mouth daily.   ciprofloxacin 500 MG tablet Commonly known as: Cipro Take 1 tablet (500 mg total) by mouth daily for 4 days. Take after HD on HD days   cyclobenzaprine 10 MG tablet Commonly known as: FLEXERIL Take 1 tablet (10 mg total) by mouth 2 (two) times daily as needed for muscle spasms.   diclofenac Sodium 1 % Gel Commonly known as: VOLTAREN Apply 4 g topically 4 (four) times daily.   Entresto 49-51 MG Generic drug: sacubitril-valsartan Take 1 tablet by mouth 2 (two) times daily.   feeding supplement Liqd Take 237 mLs by mouth 2 (two) times daily between meals.   gabapentin 100 MG capsule Commonly known as: NEURONTIN Take 100 mg by mouth 3 (three) times daily.   insulin aspart 100 UNIT/ML injection Commonly known  as: novoLOG Inject 5 Units into the skin 3 (three) times daily before meals.   lanthanum 1000 MG chewable tablet Commonly known as: FOSRENOL Chew 1,000 mg by mouth 5 (five) times daily.   lidocaine 5 % Commonly known as: LIDODERM Place 1 patch onto the skin daily. Remove & Discard patch within 12 hours or as directed by MD   prochlorperazine 5 MG tablet Commonly known as: COMPAZINE Take 1 tablet (5 mg total) by mouth every 6 (six) hours as needed for up to 5 days for nausea or vomiting.   sertraline 50 MG tablet Commonly known as: ZOLOFT Take 50 mg by mouth daily.   Velphoro 500 MG chewable tablet Generic drug: sucroferric oxyhydroxide Chew 500 mg by mouth 2 (two) times daily.         Disposition and follow-up:   Mr.Kiegan Street was discharged from [facility] in Stable condition.  At the hospital follow up visit please address:  1.  Follow-up: A. Cirrhosis- NEW diagnosis. High concern for SBP during admission. PMNs 97 but this is after he got Abx dose. Please refer to GI for EGD and further evaluation of cirrhosis.   B. Diabetes- unsure if type 1 vs 2. Antibodies sent  C. Goals of care- overall I am concerned about his prognosis, as he has multi-organ failure. Would recommend helping patient with MOST form and engage with him about indications for palliative.  D. Please  ensure he follows up with Cardiology for his Herat failure  2.  Labs / imaging needed at time of follow-up: CBC, INR, CMP  3.  Pending labs/ test needing follow-up: culture ascitic fluid, diabetes antibodies  4.  Medication Changes Abx -  Ciprofloxacin 500 mg every day  End Date: 04/29/23  Follow-up Appointments:  Follow-up Information     [facility] [place], [place]. Follow up in 2 week(s).   Contact information: [address] [place] [address] [phone]                 Hospital Course by problem list: Volume overload ESRD on HD, TTS Patient   a 27 year old male with past medical history of end-stage renal disease on dialysis Tuesday Thursdays and Saturdays who presented to the emergency department due to shortness of breath and chest pain found to have volume overload in the setting of missing dialysis.  Patient has had a total of 14.7 L fluid removed in this hospitalization.  His weight is down to 125 from 137 when he first came in.  He has remained stable over the past couple of days and we are discharging him today to follow-up with his home dialysis schedule. He has evidence of pericardial effusion on echo but Cards said to just continue with Dialysis.   Cirrhosis Patient had received several paracentesis in outpatient setting with no known history of cirrhosis on chart review. He developed abdominal pain during admission and ceftriaxone was started with high concern for SBP. Ultrasound confirmed cirrhosis and INR elevated at 1.3. Abdominal pain improved and he was discharged with 4 days of ciprofloxacin 500 mg every day (renally dosed) to complete course. He will need follow-up with GI for further evaluation of cirrhosis.  HFrEF (EF 30-35%) Volume managed with HD. He was grossly volume overloaded on arrival requiring serial HD sessions.   Type 1 vs Type 2 Diabetes Mellitus Patient reported history of diabetes but uncertain if it is type I or type II.  Patient does not follow-up with an outpatient endocrinologist so it is unclear exactly what type of diabetes the patient has.  To further clarify this diabetes with unspecified type, we sent for  anti-insulin, GAD65, IA2, and ZnT8 antibodies for which we are following up on.  We are discharging the patient today on his home insulin regimen,5 units of NovoLog 3 times daily.   Anemia of chronic disease  Baseline hgb around 7-8. Labs consistent with anemia of chronic disease.  Hypertension Patients has a hx of hypertension. Home medications include coreg 25 mg, Entresto 49-51 mg.We will  continue on this same regimen on discharge.   8/21: Pt reports doing well this morning. Pt denies any abdominal pain. Pt is advised to complete his Course of antibiotics at home. Denies dizziness or lightheadedness.   Discharge Subjective: Patient was laying in bed with no acute distress or complains at this time. His son was at bed side. His abdominal pain has resolved. I reviewed importance of picking up antibiotic and completing remaining 4 day course. He has a PCP and will call them to schedule and appointment. We discussed need for GI follow-up through PCP to evaluate new diagnosis of cirrhosis.  Discharge Exam:   BP (!) 142/75   Pulse 91   Temp 98 F (36.7 C) (Oral)   Resp 19   Ht 6\' 5"  (1.956 m)   Wt 119.5 kg   SpO2 98%   BMI 31.24 kg/m    Constitutional: Laying  in bed ,not in acute distress Neck: no JVD Cardiovascular: regular rate and rhythm, no m/r/g, Fistula to left upper extremity Pulmonary/Chest: normal work of breathing on room air, lungs clear to auscultation bilaterally Abdominal: soft, non-tender, distention improved  MSK: 1+ pitting edema LE bilaterally  Neurological: alert & oriented x 3 Skin: warm and dry  Pertinent Labs, Studies, and Procedures:     Latest Ref Rng & Units 04/25/2023    1:47 AM 04/24/2023    1:01 AM 04/23/2023    1:31 AM  CBC  WBC 4.0 - 10.5 K/uL 6.3  6.5  6.9   Hemoglobin 13.0 - 17.0 g/dL 7.3  7.7  8.4   Hematocrit 39.0 - 52.0 % 24.5  25.7  28.0   Platelets 150 - 400 K/uL 430  444  441        Latest Ref Rng & Units 04/25/2023    1:47 AM 04/24/2023    1:01 AM 04/23/2023    4:26 PM  CMP  Glucose 70 - 99 mg/dL 782  956  213   BUN 6 - 20 mg/dL 39  39  34   Creatinine 0.61 - 1.24 mg/dL 0.86  5.78  4.69   Sodium 135 - 145 mmol/L 137  135  135   Potassium 3.5 - 5.1 mmol/L 4.5  4.2  4.2   Chloride 98 - 111 mmol/L 98  94  93   CO2 22 - 32 mmol/L 27  27  28    Calcium 8.9 - 10.3 mg/dL 7.9  7.9  8.0   Total Protein 6.5 - 8.1 g/dL   6.1    Total Bilirubin 0.3 - 1.2 mg/dL   0.8   Alkaline Phos 38 - 126 U/L   76   AST 15 - 41 U/L   18   ALT 0 - 44 U/L   13     IR Paracentesis  Result Date: 04/19/2023 INDICATION: Congestive heart failure and end-stage renal disease on hemodialysis with ascites. Request for paracentesis. EXAM: ULTRASOUND GUIDED PARACENTESIS MEDICATIONS: 1% lidocaine 10 mL COMPLICATIONS: None immediate. PROCEDURE: Informed written consent was obtained from the patient after a discussion of the risks, benefits and alternatives to treatment. A timeout was performed prior to the initiation of the procedure. Initial ultrasound scanning demonstrates a large amount of ascites within the right lower abdominal quadrant. The right lower abdomen was prepped and draped in the usual sterile fashion. 1% lidocaine was used for local anesthesia. Following this, a 19 gauge, 7-cm, Yueh catheter was introduced. An ultrasound image was saved for documentation purposes. The paracentesis was performed. The catheter was removed and a dressing was applied. The patient tolerated the procedure well without immediate post procedural complication. FINDINGS: A total of approximately 4 L of clear yellow fluid was removed. IMPRESSION: Successful ultrasound-guided paracentesis yielding 4 liters of peritoneal fluid. Procedure performed by: Alwyn Ren, NP Electronically Signed   By: Acquanetta Belling M.D.   On: 04/19/2023 16:15   DG Chest Port 1 View  Result Date: 04/18/2023 CLINICAL DATA:  Shortness of breath. EXAM: PORTABLE CHEST 1 VIEW COMPARISON:  04/01/2023 FINDINGS: There is marked enlargement of the cardiac silhouette which appears progressive when compared with study from 08/08/2022 and is new when compared with 06/05/2019. Imaging findings raise the concern for underlying pericardial effusion. Diminished aeration to the left base may reflect pleural effusion, atelectasis or infiltrate. Pulmonary vascular congestion is noted elsewhere in the lungs.  IMPRESSION: 1. Marked enlargement of the cardiac silhouette which appears  progressive when compared with study from 08/08/2022 and is new when compared with 06/05/2019. Imaging findings raise the concern for underlying pericardial effusion. 2. Diminished aeration to the left base may reflect pleural effusion, atelectasis or infiltrate. 3. Pulmonary vascular congestion. Electronically Signed   By: Signa Kell M.D.   On: 04/18/2023 15:19     Discharge Instructions: Dear Mr Florance,    It was a pleasure taking care of you at [facility]. You were admitted for volume overload  and treated for acute on chronic heart failure. You were also found to have cirrhosis or liver scarring. With your abdominal pain I am worried the fluid in your belly was infected, which happens with cirrhosis. You are being discharged with 4 more days of antibiotics. Be sure to complete the course. Follow-up with your primary care doctor, you will likely need to see a GI doctor for your cirrhosis as there are some complications that can happen.Please follow up with Cardiology outpatient for heart failure   -start ciprofloxacin 500 mg daily for 4 days   I am glad you are feeling better.  Dr Kathleen Lime, MD   Discharge Instructions     Ambulatory referral to Ophthalmology   Complete by: As directed    Ambulatory referral to Physical Therapy   Complete by: As directed    Diet - low sodium heart healthy   Complete by: As directed    Diet - low sodium heart healthy   Complete by: As directed    Discharge instructions   Complete by: As directed    Dear Mr Clearwater,    It was a pleasure taking care of you at [facility]. You were admitted for volume overload  and treated for acute on chronic heart failure. You were also found to have cirrhosis or liver scarring. With your abdominal pain I am worried the fluid in your belly was infected, which happens with cirrhosis. You are being discharged with 4 more  days of antibiotics. Be sure to pick these up. Follow-up with your primary care doctor, you will likely need to see a GI doctor for your cirrhosis as there are some complications that can happen.   -start ciprofloxacin 500 mg daily for 4 days   I am glad you are feeling better.  Dr Kathleen Lime, MD    Increase activity slowly   Complete by: As directed    Increase activity slowly   Complete by: As directed    No wound care   Complete by: As directed        Signed: Kathleen Lime, MD 04/25/2023, 10:29 AM   Pager: [phone]

## 2023-04-26 ENCOUNTER — Telehealth (HOSPITAL_COMMUNITY): Payer: Self-pay | Admitting: Nephrology

## 2023-04-26 NOTE — Telephone Encounter (Signed)
Transition of care contact from inpatient facility  Date of Discharge: 04/25/23 Date of Contact: 04/26/23 Method of contact: Phone  Attempted to contact patient to discuss transition of care from inpatient admission. Patient did not answer the phone. Unable to leave message - "voicemail box is full."   Ozzie Hoyle, PA-C [employer] Pager [phone]

## 2023-04-28 LAB — CULTURE, BLOOD (ROUTINE X 2)
Culture: NO GROWTH
Culture: NO GROWTH
Special Requests: ADEQUATE
Special Requests: ADEQUATE

## 2023-04-29 LAB — CULTURE, BODY FLUID W GRAM STAIN -BOTTLE: Culture: NO GROWTH

## 2023-05-01 LAB — INSULIN ANTIBODIES, BLOOD: Insulin Antibodies, Human: 6.5 uU/mL — ABNORMAL HIGH

## 2023-05-01 LAB — IA-2 AUTOANTIBODIES: IA-2 Autoantibodies: <7.5 U/mL

## 2023-05-03 LAB — ZNT8 ANTIBODIES: ZNT8 Antibodies: <15 U/mL

## 2023-05-22 LAB — FUNGUS CULTURE RESULT

## 2023-05-22 LAB — FUNGUS CULTURE WITH STAIN

## 2023-05-22 LAB — FUNGAL ORGANISM REFLEX

## 2023-08-04 ENCOUNTER — Inpatient Hospital Stay (HOSPITAL_COMMUNITY)
Admission: EM | Admit: 2023-08-04 | Discharge: 2023-08-28 | DRG: 640 | Disposition: A | Discharge status: Home Health | Payer: Medicare Other | Attending: Internal Medicine | Admitting: Internal Medicine

## 2023-08-04 ENCOUNTER — Encounter (HOSPITAL_COMMUNITY): Payer: Self-pay

## 2023-08-04 ENCOUNTER — Emergency Department (HOSPITAL_COMMUNITY): Payer: Medicare Other

## 2023-08-04 ENCOUNTER — Other Ambulatory Visit: Payer: Self-pay

## 2023-08-04 DIAGNOSIS — J9621 Acute and chronic respiratory failure with hypoxia: Secondary | ICD-10-CM | POA: Diagnosis present

## 2023-08-04 DIAGNOSIS — D509 Iron deficiency anemia, unspecified: Secondary | ICD-10-CM | POA: Diagnosis present

## 2023-08-04 DIAGNOSIS — I132 Hypertensive heart and chronic kidney disease with heart failure and with stage 5 chronic kidney disease, or end stage renal disease: Principal | ICD-10-CM | POA: Diagnosis present

## 2023-08-04 DIAGNOSIS — R188 Other ascites: Secondary | ICD-10-CM | POA: Diagnosis present

## 2023-08-04 DIAGNOSIS — J9602 Acute respiratory failure with hypercapnia: Secondary | ICD-10-CM | POA: Diagnosis not present

## 2023-08-04 DIAGNOSIS — E1022 Type 1 diabetes mellitus with diabetic chronic kidney disease: Secondary | ICD-10-CM | POA: Diagnosis present

## 2023-08-04 DIAGNOSIS — M898X9 Other specified disorders of bone, unspecified site: Secondary | ICD-10-CM | POA: Diagnosis present

## 2023-08-04 DIAGNOSIS — J9622 Acute and chronic respiratory failure with hypercapnia: Secondary | ICD-10-CM | POA: Diagnosis present

## 2023-08-04 DIAGNOSIS — J810 Acute pulmonary edema: Secondary | ICD-10-CM

## 2023-08-04 DIAGNOSIS — M25561 Pain in right knee: Secondary | ICD-10-CM | POA: Diagnosis present

## 2023-08-04 DIAGNOSIS — I959 Hypotension, unspecified: Secondary | ICD-10-CM | POA: Diagnosis present

## 2023-08-04 DIAGNOSIS — E10319 Type 1 diabetes mellitus with unspecified diabetic retinopathy without macular edema: Secondary | ICD-10-CM | POA: Diagnosis present

## 2023-08-04 DIAGNOSIS — Z6837 Body mass index (BMI) 37.0-37.9, adult: Secondary | ICD-10-CM

## 2023-08-04 DIAGNOSIS — M25062 Hemarthrosis, left knee: Secondary | ICD-10-CM | POA: Diagnosis present

## 2023-08-04 DIAGNOSIS — I3139 Other pericardial effusion (noninflammatory): Secondary | ICD-10-CM | POA: Diagnosis present

## 2023-08-04 DIAGNOSIS — Z841 Family history of disorders of kidney and ureter: Secondary | ICD-10-CM

## 2023-08-04 DIAGNOSIS — E1065 Type 1 diabetes mellitus with hyperglycemia: Secondary | ICD-10-CM | POA: Diagnosis present

## 2023-08-04 DIAGNOSIS — Z794 Long term (current) use of insulin: Secondary | ICD-10-CM

## 2023-08-04 DIAGNOSIS — Z91199 Patient's noncompliance with other medical treatment and regimen due to unspecified reason: Secondary | ICD-10-CM

## 2023-08-04 DIAGNOSIS — E8809 Other disorders of plasma-protein metabolism, not elsewhere classified: Secondary | ICD-10-CM | POA: Diagnosis present

## 2023-08-04 DIAGNOSIS — D631 Anemia in chronic kidney disease: Secondary | ICD-10-CM | POA: Diagnosis present

## 2023-08-04 DIAGNOSIS — Z8249 Family history of ischemic heart disease and other diseases of the circulatory system: Secondary | ICD-10-CM

## 2023-08-04 DIAGNOSIS — N2581 Secondary hyperparathyroidism of renal origin: Secondary | ICD-10-CM | POA: Diagnosis present

## 2023-08-04 DIAGNOSIS — Z1152 Encounter for screening for COVID-19: Secondary | ICD-10-CM | POA: Diagnosis not present

## 2023-08-04 DIAGNOSIS — M25569 Pain in unspecified knee: Secondary | ICD-10-CM

## 2023-08-04 DIAGNOSIS — Z91119 Patient's noncompliance with dietary regimen due to unspecified reason: Secondary | ICD-10-CM

## 2023-08-04 DIAGNOSIS — Z992 Dependence on renal dialysis: Secondary | ICD-10-CM | POA: Diagnosis not present

## 2023-08-04 DIAGNOSIS — E662 Morbid (severe) obesity with alveolar hypoventilation: Secondary | ICD-10-CM | POA: Diagnosis present

## 2023-08-04 DIAGNOSIS — J9601 Acute respiratory failure with hypoxia: Principal | ICD-10-CM

## 2023-08-04 DIAGNOSIS — E871 Hypo-osmolality and hyponatremia: Secondary | ICD-10-CM | POA: Diagnosis present

## 2023-08-04 DIAGNOSIS — R609 Edema, unspecified: Secondary | ICD-10-CM | POA: Diagnosis not present

## 2023-08-04 DIAGNOSIS — N186 End stage renal disease: Secondary | ICD-10-CM | POA: Diagnosis present

## 2023-08-04 DIAGNOSIS — Z91158 Patient's noncompliance with renal dialysis for other reason: Secondary | ICD-10-CM

## 2023-08-04 DIAGNOSIS — G894 Chronic pain syndrome: Secondary | ICD-10-CM | POA: Diagnosis present

## 2023-08-04 DIAGNOSIS — I10 Essential (primary) hypertension: Secondary | ICD-10-CM | POA: Diagnosis not present

## 2023-08-04 DIAGNOSIS — M25562 Pain in left knee: Secondary | ICD-10-CM | POA: Diagnosis present

## 2023-08-04 DIAGNOSIS — H5462 Unqualified visual loss, left eye, normal vision right eye: Secondary | ICD-10-CM | POA: Diagnosis present

## 2023-08-04 DIAGNOSIS — G9341 Metabolic encephalopathy: Secondary | ICD-10-CM | POA: Diagnosis present

## 2023-08-04 DIAGNOSIS — E8770 Fluid overload, unspecified: Secondary | ICD-10-CM | POA: Diagnosis present

## 2023-08-04 DIAGNOSIS — K7460 Unspecified cirrhosis of liver: Secondary | ICD-10-CM | POA: Diagnosis present

## 2023-08-04 DIAGNOSIS — I5023 Acute on chronic systolic (congestive) heart failure: Secondary | ICD-10-CM | POA: Diagnosis present

## 2023-08-04 DIAGNOSIS — Z833 Family history of diabetes mellitus: Secondary | ICD-10-CM

## 2023-08-04 DIAGNOSIS — R262 Difficulty in walking, not elsewhere classified: Secondary | ICD-10-CM | POA: Diagnosis present

## 2023-08-04 DIAGNOSIS — E875 Hyperkalemia: Secondary | ICD-10-CM | POA: Diagnosis present

## 2023-08-04 DIAGNOSIS — Z87891 Personal history of nicotine dependence: Secondary | ICD-10-CM

## 2023-08-04 DIAGNOSIS — Z79899 Other long term (current) drug therapy: Secondary | ICD-10-CM

## 2023-08-04 LAB — COMPREHENSIVE METABOLIC PANEL
ALT: 35 U/L (ref 0–44)
AST: 43 U/L — ABNORMAL HIGH (ref 15–41)
Albumin: 2.3 g/dL — ABNORMAL LOW (ref 3.5–5.0)
Alkaline Phosphatase: 256 U/L — ABNORMAL HIGH (ref 38–126)
Anion gap: 13 (ref 5–15)
BUN: 66 mg/dL — ABNORMAL HIGH (ref 6–20)
CO2: 32 mmol/L (ref 22–32)
Calcium: 9.6 mg/dL (ref 8.9–10.3)
Chloride: 97 mmol/L — ABNORMAL LOW (ref 98–111)
Creatinine, Ser: 9.55 mg/dL — ABNORMAL HIGH (ref 0.61–1.24)
GFR, Estimated: 7 mL/min — ABNORMAL LOW (ref >60)
Glucose, Bld: 132 mg/dL — ABNORMAL HIGH (ref 70–99)
Potassium: 5.9 mmol/L — ABNORMAL HIGH (ref 3.5–5.1)
Sodium: 142 mmol/L (ref 135–145)
Total Bilirubin: 1.5 mg/dL — ABNORMAL HIGH (ref <1.2)
Total Protein: 7.9 g/dL (ref 6.5–8.1)

## 2023-08-04 LAB — RESP PANEL BY RT-PCR (RSV, FLU A&B, COVID)  RVPGX2
Influenza A by PCR: NEGATIVE
Influenza B by PCR: NEGATIVE
Resp Syncytial Virus by PCR: NEGATIVE
SARS Coronavirus 2 by RT PCR: NEGATIVE

## 2023-08-04 LAB — CK: Total CK: 253 U/L (ref 49–397)

## 2023-08-04 LAB — I-STAT VENOUS BLOOD GAS, ED
Acid-Base Excess: 12 mmol/L — ABNORMAL HIGH (ref 0.0–2.0)
Bicarbonate: 39.2 mmol/L — ABNORMAL HIGH (ref 20.0–28.0)
Calcium, Ion: 1.07 mmol/L — ABNORMAL LOW (ref 1.15–1.40)
HCT: 30 % — ABNORMAL LOW (ref 39.0–52.0)
Hemoglobin: 10.2 g/dL — ABNORMAL LOW (ref 13.0–17.0)
O2 Saturation: 99 %
Potassium: >8.5 mmol/L (ref 3.5–5.1)
Sodium: 137 mmol/L (ref 135–145)
TCO2: 41 mmol/L — ABNORMAL HIGH (ref 22–32)
pCO2, Ven: 64.6 mm[Hg] — ABNORMAL HIGH (ref 44–60)
pH, Ven: 7.391 (ref 7.25–7.43)
pO2, Ven: 160 mm[Hg] — ABNORMAL HIGH (ref 32–45)

## 2023-08-04 LAB — CBC
HCT: 29.7 % — ABNORMAL LOW (ref 39.0–52.0)
Hemoglobin: 8.9 g/dL — ABNORMAL LOW (ref 13.0–17.0)
MCH: 27.8 pg (ref 26.0–34.0)
MCHC: 30 g/dL (ref 30.0–36.0)
MCV: 92.8 fL (ref 80.0–100.0)
Platelets: 231 10*3/uL (ref 150–400)
RBC: 3.2 MIL/uL — ABNORMAL LOW (ref 4.22–5.81)
RDW: 17.1 % — ABNORMAL HIGH (ref 11.5–15.5)
WBC: 5.4 10*3/uL (ref 4.0–10.5)
nRBC: 0 % (ref 0.0–0.2)

## 2023-08-04 LAB — I-STAT CG4 LACTIC ACID, ED: Lactic Acid, Venous: 0.9 mmol/L (ref 0.5–1.9)

## 2023-08-04 LAB — HEPATITIS B SURFACE ANTIGEN: Hepatitis B Surface Ag: NONREACTIVE

## 2023-08-04 LAB — GLUCOSE, CAPILLARY: Glucose-Capillary: 180 mg/dL — ABNORMAL HIGH (ref 70–99)

## 2023-08-04 MED ORDER — HEPARIN SODIUM (PORCINE) 1000 UNIT/ML DIALYSIS
10000.0000 [IU] | INTRAMUSCULAR | Status: DC | PRN
  Administered 2023-08-04: 10000 [IU] via INTRAVENOUS_CENTRAL

## 2023-08-04 MED ORDER — INSULIN ASPART 100 UNIT/ML IJ SOLN
0.0000 [IU] | Freq: Three times a day (TID) | INTRAMUSCULAR | Status: DC
  Administered 2023-08-05: 3 [IU] via SUBCUTANEOUS
  Administered 2023-08-05 – 2023-08-08 (×3): 2 [IU] via SUBCUTANEOUS
  Administered 2023-08-09: 3 [IU] via SUBCUTANEOUS
  Administered 2023-08-09: 2 [IU] via SUBCUTANEOUS
  Administered 2023-08-10 – 2023-08-11 (×4): 3 [IU] via SUBCUTANEOUS
  Administered 2023-08-11: 2 [IU] via SUBCUTANEOUS
  Administered 2023-08-12: 11 [IU] via SUBCUTANEOUS
  Administered 2023-08-12 (×2): 8 [IU] via SUBCUTANEOUS
  Administered 2023-08-13: 3 [IU] via SUBCUTANEOUS
  Administered 2023-08-13 (×2): 15 [IU] via SUBCUTANEOUS
  Administered 2023-08-14: 5 [IU] via SUBCUTANEOUS
  Administered 2023-08-14: 8 [IU] via SUBCUTANEOUS
  Administered 2023-08-14: 11 [IU] via SUBCUTANEOUS
  Administered 2023-08-15: 5 [IU] via SUBCUTANEOUS
  Administered 2023-08-15 – 2023-08-16 (×2): 3 [IU] via SUBCUTANEOUS
  Administered 2023-08-16: 2 [IU] via SUBCUTANEOUS
  Administered 2023-08-16: 5 [IU] via SUBCUTANEOUS
  Administered 2023-08-17: 2 [IU] via SUBCUTANEOUS
  Administered 2023-08-17: 5 [IU] via SUBCUTANEOUS
  Administered 2023-08-17: 2 [IU] via SUBCUTANEOUS
  Administered 2023-08-18: 3 [IU] via SUBCUTANEOUS
  Administered 2023-08-18: 2 [IU] via SUBCUTANEOUS
  Administered 2023-08-19 (×2): 3 [IU] via SUBCUTANEOUS
  Administered 2023-08-20 (×2): 2 [IU] via SUBCUTANEOUS
  Administered 2023-08-21 – 2023-08-22 (×2): 3 [IU] via SUBCUTANEOUS
  Administered 2023-08-22: 2 [IU] via SUBCUTANEOUS
  Administered 2023-08-23: 3 [IU] via SUBCUTANEOUS
  Administered 2023-08-23 – 2023-08-24 (×2): 2 [IU] via SUBCUTANEOUS
  Administered 2023-08-24: 3 [IU] via SUBCUTANEOUS
  Administered 2023-08-25 – 2023-08-28 (×4): 2 [IU] via SUBCUTANEOUS
  Administered 2023-08-28: 3 [IU] via SUBCUTANEOUS

## 2023-08-04 MED ORDER — ONDANSETRON HCL 4 MG PO TABS: 4.0000 mg | ORAL_TABLET | Freq: Four times a day (QID) | ORAL | Status: DC | PRN

## 2023-08-04 MED ORDER — INSULIN ASPART 100 UNIT/ML IJ SOLN
0.0000 [IU] | Freq: Every day | INTRAMUSCULAR | Status: DC
  Administered 2023-08-12 – 2023-08-13 (×2): 3 [IU] via SUBCUTANEOUS
  Administered 2023-08-14: 2 [IU] via SUBCUTANEOUS

## 2023-08-04 MED ORDER — HEPARIN SODIUM (PORCINE) 5000 UNIT/ML IJ SOLN
5000.0000 [IU] | Freq: Three times a day (TID) | INTRAMUSCULAR | Status: DC
  Administered 2023-08-10 – 2023-08-25 (×37): 5000 [IU] via SUBCUTANEOUS
  Filled 2023-08-04 (×48): qty 1

## 2023-08-04 MED ORDER — HYDROXYZINE HCL 25 MG PO TABS
25.0000 mg | ORAL_TABLET | Freq: Two times a day (BID) | ORAL | Status: DC | PRN
  Administered 2023-08-05 – 2023-08-26 (×9): 25 mg via ORAL
  Filled 2023-08-04 (×10): qty 1

## 2023-08-04 MED ORDER — ISOSORBIDE MONONITRATE ER 60 MG PO TB24
90.0000 mg | ORAL_TABLET | Freq: Every day | ORAL | Status: DC
  Administered 2023-08-05 – 2023-08-28 (×22): 90 mg via ORAL
  Filled 2023-08-04 (×24): qty 1

## 2023-08-04 MED ORDER — ENSURE ENLIVE PO LIQD: 237.0000 mL | Freq: Two times a day (BID) | ORAL | Status: DC

## 2023-08-04 MED ORDER — ALBUTEROL SULFATE (2.5 MG/3ML) 0.083% IN NEBU: 2.5000 mg | INHALATION_SOLUTION | Freq: Four times a day (QID) | RESPIRATORY_TRACT | Status: DC | PRN

## 2023-08-04 MED ORDER — ANTICOAGULANT SODIUM CITRATE 4% (200MG/5ML) IV SOLN: 5.0000 mL | Status: DC | PRN

## 2023-08-04 MED ORDER — ACETAMINOPHEN 650 MG RE SUPP: 650.0000 mg | Freq: Four times a day (QID) | RECTAL | Status: DC | PRN

## 2023-08-04 MED ORDER — LIDOCAINE-PRILOCAINE 2.5-2.5 % EX CREA: 1.0000 | TOPICAL_CREAM | CUTANEOUS | Status: DC | PRN

## 2023-08-04 MED ORDER — OXYCODONE-ACETAMINOPHEN 5-325 MG PO TABS
2.0000 | ORAL_TABLET | Freq: Every evening | ORAL | Status: DC | PRN
  Administered 2023-08-05: 2 via ORAL
  Filled 2023-08-04: qty 2

## 2023-08-04 MED ORDER — HYDRALAZINE HCL 50 MG PO TABS
100.0000 mg | ORAL_TABLET | Freq: Three times a day (TID) | ORAL | Status: DC
  Administered 2023-08-04 – 2023-08-28 (×64): 100 mg via ORAL
  Filled 2023-08-04 (×67): qty 2

## 2023-08-04 MED ORDER — ALTEPLASE 2 MG IJ SOLR: 2.0000 mg | Freq: Once | INTRAMUSCULAR | Status: DC | PRN

## 2023-08-04 MED ORDER — PENTAFLUOROPROP-TETRAFLUOROETH EX AERO: 1.0000 | INHALATION_SPRAY | CUTANEOUS | Status: DC | PRN

## 2023-08-04 MED ORDER — LORATADINE 10 MG PO TABS
10.0000 mg | ORAL_TABLET | Freq: Every day | ORAL | Status: DC
  Administered 2023-08-05 – 2023-08-28 (×8): 10 mg via ORAL
  Filled 2023-08-04 (×19): qty 1

## 2023-08-04 MED ORDER — CHLORHEXIDINE GLUCONATE CLOTH 2 % EX PADS
6.0000 | MEDICATED_PAD | Freq: Every day | CUTANEOUS | Status: DC
  Administered 2023-08-04: 6 via TOPICAL

## 2023-08-04 MED ORDER — SENNOSIDES-DOCUSATE SODIUM 8.6-50 MG PO TABS: 1.0000 | ORAL_TABLET | Freq: Every evening | ORAL | Status: DC | PRN

## 2023-08-04 MED ORDER — LIDOCAINE HCL (PF) 1 % IJ SOLN: 5.0000 mL | INTRAMUSCULAR | Status: DC | PRN

## 2023-08-04 MED ORDER — HEPARIN SODIUM (PORCINE) 1000 UNIT/ML DIALYSIS: 1000.0000 [IU] | INTRAMUSCULAR | Status: DC | PRN

## 2023-08-04 MED ORDER — CYCLOBENZAPRINE HCL 10 MG PO TABS
10.0000 mg | ORAL_TABLET | Freq: Two times a day (BID) | ORAL | Status: DC | PRN
  Administered 2023-08-05 – 2023-08-14 (×7): 10 mg via ORAL
  Filled 2023-08-04 (×8): qty 1

## 2023-08-04 MED ORDER — ORAL CARE MOUTH RINSE: 15.0000 mL | OROMUCOSAL | Status: DC | PRN

## 2023-08-04 MED ORDER — HEPARIN SODIUM (PORCINE) 1000 UNIT/ML IJ SOLN
INTRAMUSCULAR | Status: AC
  Filled 2023-08-04: qty 5

## 2023-08-04 MED ORDER — SODIUM ZIRCONIUM CYCLOSILICATE 5 G PO PACK
5.0000 g | PACK | ORAL | Status: DC
  Filled 2023-08-04: qty 1

## 2023-08-04 MED ORDER — ONDANSETRON HCL 4 MG/2ML IJ SOLN: 4.0000 mg | Freq: Four times a day (QID) | INTRAMUSCULAR | Status: DC | PRN

## 2023-08-04 MED ORDER — CARVEDILOL 25 MG PO TABS
25.0000 mg | ORAL_TABLET | Freq: Two times a day (BID) | ORAL | Status: DC
  Administered 2023-08-05 – 2023-08-28 (×41): 25 mg via ORAL
  Filled 2023-08-04 (×43): qty 1

## 2023-08-04 MED ORDER — GABAPENTIN 100 MG PO CAPS
100.0000 mg | ORAL_CAPSULE | Freq: Three times a day (TID) | ORAL | Status: DC
  Administered 2023-08-04 – 2023-08-07 (×8): 100 mg via ORAL
  Filled 2023-08-04 (×8): qty 1

## 2023-08-04 MED ORDER — ALBUTEROL SULFATE HFA 108 (90 BASE) MCG/ACT IN AERS: 1.0000 | INHALATION_SPRAY | Freq: Four times a day (QID) | RESPIRATORY_TRACT | Status: DC | PRN

## 2023-08-04 MED ORDER — ACETAMINOPHEN 325 MG PO TABS
650.0000 mg | ORAL_TABLET | Freq: Four times a day (QID) | ORAL | Status: DC | PRN
  Administered 2023-08-08 – 2023-08-24 (×2): 650 mg via ORAL
  Filled 2023-08-04 (×2): qty 2

## 2023-08-04 NOTE — ED Triage Notes (Signed)
Patient coming from [facility] with bodyaches, fatigue.; Patient reported he was in to much pain to go to dialysis.  Normall not on oxygen but was initially mid 80's so ems placed on 4L .    [id number]

## 2023-08-04 NOTE — H&P (Signed)
History and Physical    Patient: Bobby [name] WNU:[id number] DOB: [birth year] DOA: 08/04/2023 DOS: the patient was seen and examined on 08/04/2023 PCP: [facility]  Patient coming from: Home  Chief Complaint:  Chief Complaint  Patient presents with   Generalized Body Aches   HPI: Bobby [name] is a 27 y.o. male with medical history significant of T1DM complicated by diabetic retinopathy, ESRD on HD TTS, obesity, HTN and anxiety who presents to the ED for diffuse muscle aches. Patient states he missed his HD session on Tuesday due to miscommunication.  Since then, he has had worsening muscle pain all over. He was able to get dialysis on Thursday with 6 L removed.  He presents today to the ED instead of his dialysis center due to not feeling well. He also endorsed shortness of breath, dyspnea on exertion, worsening abdominal swelling and leg swelling but denies any chest pain, dizziness, headaches, abdominal pain, fever, chills or cough. States he gets serial paracentesis every 2 weeks and was due to get 1 last week but states he was unable to go.  ED course:  Found to be hypotensive with SBP in the 140s and mildly tachypneic but normal BP and temperature.  Hypoxic to the 80s so placed on 2 L [place]. Labs show WBC 5.4, Hgb 8.9, platelet 231, K+ 5.9, BUN/creatinine 66/9.55, CK2 53, lactic acid 0.9, VBG showed pH 7.39, pCO2 64.6, pO2 160, bicarb 39.2.  Negative COVID and flu test. CXR showed bilateral diffuse interstitial and patchy opacities concerning for pulmonary edema as well as moderate bilateral pleural effusions. Nephrology was consulted and patient was transferred for urgent HD TRH was consulted for admission  Review of Systems: As mentioned in the history of present illness. All other systems reviewed and are negative. Past Medical History:  Diagnosis Date   Diabetes mellitus without complication (HCC)    Type I   ESRD (end stage renal disease) (HCC)     hemodialysis initiated 02/28/21   Hypertension    Past Surgical History:  Procedure Laterality Date   AV FISTULA PLACEMENT Left 03/04/2021   Procedure: Basilic Vein Transposition Left upper arm fistula;  Surgeon: Larina Earthly, MD;  Location: [facility];  Service: Vascular;  Laterality: Left;  PERIPHERAL NERVE BLOCK   IR PARACENTESIS  04/19/2023   IR PARACENTESIS  04/24/2023   IR PERC TUN PERIT CATH WO PORT S&I /IMAG  02/28/2021   IR US GUIDE VASC ACCESS RIGHT  02/28/2021   Social History:  reports that he has quit smoking. His smoking use included cigarettes. He has never used smokeless tobacco. He reports current drug use. Drug: Marijuana. He reports that he does not drink alcohol.  No Known Allergies  Family History  Problem Relation Age of Onset   Diabetes Mother    Kidney disease Other    Hypertension Other     Prior to Admission medications   Medication Sig Start Date End Date Taking? Authorizing Provider  acetaminophen (TYLENOL) 325 MG tablet Take 650 mg by mouth as needed for mild pain or moderate pain. 09/30/22   [provider]  carvedilol (COREG) 25 MG tablet Take 1 tablet (25 mg total) by mouth 2 (two) times daily with a meal. 03/12/21   Ghimire, Werner Lean, MD  cinacalcet (SENSIPAR) 30 MG tablet Take 30 mg by mouth daily.    [provider]  cyclobenzaprine (FLEXERIL) 10 MG tablet Take 1 tablet (10 mg total) by mouth 2 (two) times daily as needed  for muscle spasms. 04/03/23   Kathleen Lime, MD  Darbepoetin Alfa (ARANESP) 150 MCG/0.3ML SOSY injection Inject 0.3 mLs (150 mcg total) into the skin every Monday at 6 PM. Given with dialysis 04/09/23   Kathleen Lime, MD  diclofenac Sodium (VOLTAREN) 1 % GEL Apply 4 g topically 4 (four) times daily. 04/03/23   Kathleen Lime, MD  feeding supplement (ENSURE ENLIVE / ENSURE PLUS) LIQD Take 237 mLs by mouth 2 (two) times daily between meals. 04/24/23   Kathleen Lime, MD  gabapentin (NEURONTIN) 100 MG capsule Take 100 mg by mouth 3  (three) times daily. 03/24/23   [provider]  insulin aspart (NOVOLOG) 100 UNIT/ML injection Inject 5 Units into the skin 3 (three) times daily before meals. 03/12/21   Ghimire, Werner Lean, MD  lanthanum (FOSRENOL) 1000 MG chewable tablet Chew 1,000 mg by mouth 5 (five) times daily.    [provider]  lidocaine (LIDODERM) 5 % Place 1 patch onto the skin daily. Remove & Discard patch within 12 hours or as directed by MD 04/04/23   Kathleen Lime, MD  prochlorperazine (COMPAZINE) 5 MG tablet Take 1 tablet (5 mg total) by mouth every 6 (six) hours as needed for up to 5 days for nausea or vomiting. 04/23/23 04/28/23  Kathleen Lime, MD  sertraline (ZOLOFT) 50 MG tablet Take 50 mg by mouth daily. 03/26/23   [provider]  VELPHORO 500 MG chewable tablet Chew 500 mg by mouth 2 (two) times daily. 04/05/23   [provider]    Physical Exam: Vitals:   08/04/23 1800 08/04/23 1813 08/04/23 1930 08/04/23 1933  BP: (!) 147/75 139/80  (!) 159/95  Pulse: 78 82  90  Resp: (!) 23 20  (!) 22  Temp:  (!) 97.4 F (36.3 C)  (!) 96.4 F (35.8 C)  TempSrc:    Oral  SpO2: 91% 100%    Weight:   (!) 144.5 kg   Height:       General: Obese young man laying in HD bed. No acute distress. HEENT: [place]/AT. Anicteric sclera CV: RRR. No murmurs, rubs, or gallops. 2+ BLE pitting edema Pulmonary: On 2 L [place].  Anterior lung sounds clear to auscultation.  Distant crackles at the bases. Abdominal: Soft, nontender.  Moderate abdominal ascites. Normal bowel sounds. Extremities: Palpable radial and DP pulses.  Palpable LUE AV fistula Skin: Warm and dry. Thickening and hyperpigmentation of the lower extremities. Neuro: A&Ox3. Moves all extremities. Normal sensation to light touch. No focal deficit. Psych: Flat affect.  Data Reviewed:  Labs show WBC 5.4, Hgb 8.9, platelet 231, K+ 5.9, BUN/creatinine 66/9.55, CK2 53, lactic acid 0.9, VBG showed pH 7.39, pCO2 64.6, pO2 160, bicarb 39.2. Negative  COVID and flu test. CXR showed bilateral diffuse interstitial and patchy opacities concerning for pulmonary edema as well as moderate bilateral pleural effusions.  Assessment and Plan: Carmi Osterkamp is a 27 y.o. male with medical history significant of T1DM complicated by diabetic retinopathy, ESRD on HD TTS, obesity, HTN and anxiety who presents to the ED for diffuse muscle aches and admitted for acute respiratory failure with hypoxia and hypercarbia  # Acute respiratory failure with hypoxia and hypercapnia # Pulmonary edema # ESRD on HD TTS Noncompliant HD patient known well by nephrology presenting to the ED for evaluation of generalized muscle aches and shortness of breath and found to have hypoxic and hypercarbic respiratory failure. CXR with signs of pulmonary edema. Required urgent HD with 5 L of ultrafiltration removed. VBG  showed elevated pCO2 to 64.6.  Patient with likely obesity hypoventilation syndrome. -Nephrology consulted, appreciate recs -Another HD session tomorrow -Continue supplemental O2 and BiPAP therapy  # Hyperkalemia K+ of 5.9 on arrival likely contributing to patient's muscle aches. -Managed with HD as above  #Hx of ascites Patient with history of abdominal ascites requiring therapeutic paracentesis by IR every 2 weeks.  States he was due for a paracentesis last week but did not go.  He has moderate abdominal ascites on exam. -IR consulted for therapeutic paracentesis, appreciate assistance  # Muscle aches Patient presenting with complaint of generalized muscle aches.  He denies any fevers or chills.  No evidence of an infectious etiology based on labs and imaging.  Muscle aches likely secondary to missing HD earlier this week with elevated potassium levels. -Flexeril 10 mg twice daily as needed for muscle spasms  # HTN BP elevated with SBP in the 130s to 150s -Hydralazine 100 mg 3 times daily -Coreg 25 mg 2 times daily -Imdur for 60 mg daily  #  T1DM -Follow-up A1c -SSI with meals, CBG checks  # Anxiety -Hydralazine 25 mg twice daily as needed for anxiety   Advance Care Planning:   Code Status: Full Code   Consults: Nephrology  Family Communication: No family at bedside  Severity of Illness: The appropriate patient status for this patient is INPATIENT. Inpatient status is judged to be reasonable and necessary in order to provide the required intensity of service to ensure the patient's safety. The patient's presenting symptoms, physical exam findings, and initial radiographic and laboratory data in the context of their chronic comorbidities is felt to place them at high risk for further clinical deterioration. Furthermore, it is not anticipated that the patient will be medically stable for discharge from the hospital within 2 midnights of admission.   * I certify that at the point of admission it is my clinical judgment that the patient will require inpatient hospital care spanning beyond 2 midnights from the point of admission due to high intensity of service, high risk for further deterioration and high frequency of surveillance required.*  Author: Steffanie Rainwater, MD 08/04/2023 7:41 PM  For on call review [email or web address].

## 2023-08-04 NOTE — Consult Note (Signed)
[facility] Renal Consultation Note    Indication for Consultation:  Management of ESRD/hemodialysis; anemia, hypertension/volume and secondary hyperparathyroidism   HPI: Bobby [name] is a 27 y.o. male with ESRD on HD, DM, CHF, recurrent ascites,  who presents to the ED with diffuse body pains. ED evaluation revealing abnormal ABG, K 8.5 on I-stat initially but  5.9.on CMP.  CXR with pulmonary edema.  Due for dialysis today, presented to ED because he felt too bad to go to his center. Says he's been feeling bad for about 3 weeks. No fevers/chills, chest pain, nausea/vomiting. Also requires frequent paracentesis- reports last around 3 weeks ago.   He struggles with chronic volume overload mostly secondary to noncompliance with dialysis prescription and very high fluid gains between treatments. He did attend dialysis Thursday 11/28 but missed Tues. He had 6L removed and left 34 kg over his dry weight. Felt he will need admission and serial dialysis here.   Past Medical History:  Diagnosis Date   Diabetes mellitus without complication (HCC)    Type I   ESRD (end stage renal disease) (HCC)    hemodialysis initiated 02/28/21   Hypertension    Past Surgical History:  Procedure Laterality Date   AV FISTULA PLACEMENT Left 03/04/2021   Procedure: Basilic Vein Transposition Left upper arm fistula;  Surgeon: Larina Earthly, MD;  Location: [facility] OR;  Service: Vascular;  Laterality: Left;  PERIPHERAL NERVE BLOCK   IR PARACENTESIS  04/19/2023   IR PARACENTESIS  04/24/2023   IR PERC TUN PERIT CATH WO PORT S&I /IMAG  02/28/2021   IR US GUIDE VASC ACCESS RIGHT  02/28/2021   Family History  Problem Relation Age of Onset   Diabetes Mother    Kidney disease Other    Hypertension Other    Social History:  reports that he has quit smoking. His smoking use included cigarettes. He has never used smokeless tobacco. He reports current drug use. Drug: Marijuana. He reports that he does not drink  alcohol. No Known Allergies Prior to Admission medications   Medication Sig Start Date End Date Taking? Authorizing Provider  acetaminophen (TYLENOL) 325 MG tablet Take 650 mg by mouth as needed for mild pain or moderate pain. 09/30/22   [provider]  carvedilol (COREG) 25 MG tablet Take 1 tablet (25 mg total) by mouth 2 (two) times daily with a meal. 03/12/21   Ghimire, Werner Lean, MD  cinacalcet (SENSIPAR) 30 MG tablet Take 30 mg by mouth daily.    [provider]  cyclobenzaprine (FLEXERIL) 10 MG tablet Take 1 tablet (10 mg total) by mouth 2 (two) times daily as needed for muscle spasms. 04/03/23   Kathleen Lime, MD  Darbepoetin Alfa (ARANESP) 150 MCG/0.3ML SOSY injection Inject 0.3 mLs (150 mcg total) into the skin every Monday at 6 PM. Given with dialysis 04/09/23   Kathleen Lime, MD  diclofenac Sodium (VOLTAREN) 1 % GEL Apply 4 g topically 4 (four) times daily. 04/03/23   Kathleen Lime, MD  feeding supplement (ENSURE ENLIVE / ENSURE PLUS) LIQD Take 237 mLs by mouth 2 (two) times daily between meals. 04/24/23   Kathleen Lime, MD  gabapentin (NEURONTIN) 100 MG capsule Take 100 mg by mouth 3 (three) times daily. 03/24/23   [provider]  insulin aspart (NOVOLOG) 100 UNIT/ML injection Inject 5 Units into the skin 3 (three) times daily before meals. 03/12/21   Ghimire, Werner Lean, MD  lanthanum (FOSRENOL) 1000 MG chewable tablet Chew 1,000 mg by mouth  5 (five) times daily.    [provider]  lidocaine (LIDODERM) 5 % Place 1 patch onto the skin daily. Remove & Discard patch within 12 hours or as directed by MD 04/04/23   Kathleen Lime, MD  prochlorperazine (COMPAZINE) 5 MG tablet Take 1 tablet (5 mg total) by mouth every 6 (six) hours as needed for up to 5 days for nausea or vomiting. 04/23/23 04/28/23  Kathleen Lime, MD  sertraline (ZOLOFT) 50 MG tablet Take 50 mg by mouth daily. 03/26/23   [provider]  VELPHORO 500 MG chewable tablet Chew 500 mg by mouth  2 (two) times daily. 04/05/23   [provider]   Current Facility-Administered Medications  Medication Dose Route Frequency Provider Last Rate Last Admin   [START ON 08/05/2023] Chlorhexidine Gluconate Cloth 2 % PADS 6 each  6 each Topical Q0600 Tomasa Blase, PA-C       Current Outpatient Medications  Medication Sig Dispense Refill   acetaminophen (TYLENOL) 325 MG tablet Take 650 mg by mouth as needed for mild pain or moderate pain.     carvedilol (COREG) 25 MG tablet Take 1 tablet (25 mg total) by mouth 2 (two) times daily with a meal. 60 tablet 1   cinacalcet (SENSIPAR) 30 MG tablet Take 30 mg by mouth daily.     cyclobenzaprine (FLEXERIL) 10 MG tablet Take 1 tablet (10 mg total) by mouth 2 (two) times daily as needed for muscle spasms. 20 tablet 0   Darbepoetin Alfa (ARANESP) 150 MCG/0.3ML SOSY injection Inject 0.3 mLs (150 mcg total) into the skin every Monday at 6 PM. Given with dialysis 1.68 mL 0   diclofenac Sodium (VOLTAREN) 1 % GEL Apply 4 g topically 4 (four) times daily. 100 g 0   feeding supplement (ENSURE ENLIVE / ENSURE PLUS) LIQD Take 237 mLs by mouth 2 (two) times daily between meals. 237 mL 12   gabapentin (NEURONTIN) 100 MG capsule Take 100 mg by mouth 3 (three) times daily.     insulin aspart (NOVOLOG) 100 UNIT/ML injection Inject 5 Units into the skin 3 (three) times daily before meals. 10 mL 1   lanthanum (FOSRENOL) 1000 MG chewable tablet Chew 1,000 mg by mouth 5 (five) times daily.     lidocaine (LIDODERM) 5 % Place 1 patch onto the skin daily. Remove & Discard patch within 12 hours or as directed by MD 30 patch 0   prochlorperazine (COMPAZINE) 5 MG tablet Take 1 tablet (5 mg total) by mouth every 6 (six) hours as needed for up to 5 days for nausea or vomiting. 20 tablet 0   sertraline (ZOLOFT) 50 MG tablet Take 50 mg by mouth daily.     VELPHORO 500 MG chewable tablet Chew 500 mg by mouth 2 (two) times daily.       ROS: As per HPI otherwise  negative.  Physical Exam: Vitals:   08/04/23 1031 08/04/23 1033 08/04/23 1041  BP:  (!) 143/84   Pulse:  87   Resp:  (!) 24   Temp:  98.7 F (37.1 C)   TempSrc:  Oral   SpO2:  91% 97%  Weight: 119.3 kg    Height: 6\' 5"  (1.956 m)       General: Chronically ill appearing on nasal O2, nad  Head: NCAT s/p L eye removal  Neck: Supple. No JVD appreciated  Lungs: Diminished  at bases bilaterally  Heart: RRR, no murmur, rub, or gallop  Abdomen: large, distended  Lower extremities:  3+ diffuse tight edema all the way up to hips  Neuro: A & O X 3. Moves all extremities spontaneously. Psych:  Fatigued but responds to questions appropriately.  Dialysis Access: LUE AVF +bruit   Labs: Basic Metabolic Panel: Recent Labs  Lab 08/04/23 1135 08/04/23 1217  NA 142 137  K 5.9* >8.5*  CL 97*  --   CO2 32  --   GLUCOSE 132*  --   BUN 66*  --   CREATININE 9.55*  --   CALCIUM 9.6  --    Liver Function Tests: Recent Labs  Lab 08/04/23 1135  AST 43*  ALT 35  ALKPHOS 256*  BILITOT 1.5*  PROT 7.9  ALBUMIN 2.3*   No results for input(s): "LIPASE", "AMYLASE" in the last 168 hours. No results for input(s): "AMMONIA" in the last 168 hours. CBC: Recent Labs  Lab 08/04/23 1135 08/04/23 1217  WBC 5.4  --   HGB 8.9* 10.2*  HCT 29.7* 30.0*  MCV 92.8  --   PLT 231  --    Cardiac Enzymes: Recent Labs  Lab 08/04/23 1135  CKTOTAL 253   CBG: No results for input(s): "GLUCAP" in the last 168 hours. Iron Studies: No results for input(s): "IRON", "TIBC", "TRANSFERRIN", "FERRITIN" in the last 72 hours. Studies/Results: DG Chest Port 1 View  Result Date: 08/04/2023 CLINICAL DATA:  Dyspnea EXAM: PORTABLE CHEST 1 VIEW COMPARISON:  Chest radiograph dated 07/10/2023 FINDINGS: Markedly low lung volumes. Bilateral diffuse interstitial and patchy opacities. Dense bibasilar opacities. Similar moderate bilateral pleural effusions. Similar enlarged cardiomediastinal silhouette. No acute osseous  abnormality. IMPRESSION: 1. Markedly low lung volumes with bilateral diffuse interstitial and patchy opacities, likely pulmonary edema. 2. Similar moderate bilateral pleural effusions with dense bibasilar opacities, likely atelectasis. 3. Similar cardiomegaly. Electronically Signed   By: Agustin Cree M.D.   On: 08/04/2023 12:11    Dialysis Orders:  HP TTS 4:15 500/A2x EDW 112 kg 2K/2Ca AVF Heparin 57846 U Last HD 11/28 - post HD wt 146 kg  Mircera 200 q 2 weeks (last 11/19) Venofer 100 x 10  Hectorol 8 q TIW  Assessment/Plan: Profound volume overload - Chronic issue. Now  > 30 kg over EDW. Will need serial HD for volume removal  Hypercapnia - per primary  ESRD -  HD TTS. Serial HD as above. Plan for HD today and tomorrow.  Hyperkalemia - will correct with HD -will do 2K bath  Hypertension/volume  - BP acceptable. Massive volume overload as above Anemia  - Hgb 8.9 on ESA. Continue IV Fe here if Bcx negative  Metabolic bone disease -  Continue home binders. Follow Ca/Phos here  Recurrent ascites - Defer to primary  Nutrition - Renal diet with fluid restriction   Tomasa Blase PA-C [facility] 08/04/2023, 1:00 PM

## 2023-08-04 NOTE — Progress Notes (Signed)
TRH night cross cover note:   I was notified by RN of the patient's request for resumption of his home Percocet, which she takes on a scheduled nightly basis for chronic left knee discomfort.  I subsequently placed order for Percocet 5/325 mg p.o. x 2 tabs nightly as needed for pain.     Newton Pigg, DO Hospitalist

## 2023-08-04 NOTE — ED Provider Notes (Signed)
[place] EMERGENCY DEPARTMENT AT [facility] Provider Note   CSN: [id number] Arrival date & time: 08/04/23  1022     History  Chief Complaint  Patient presents with   Generalized Body Aches    Bobby [name] is a 27 y.o. male.  HPI 27 year old male end-stage renal disease on dialysis dialyzed Tuesday Thursday Saturday with last dialysis on Thursday presents today from rehab facility with generalized bodyaches.  Patient states that he has had bodyaches for the past 2 weeks.  He denies any recent evaluations other than going to dialysis.  He denies any injuries or falls.  He states that he has muscle aches everywhere.  Movement exacerbates the symptoms.  Denies fever, chills, chest pain, runny nose, cough, sore throat, nausea, vomiting, diarrhea, or wounds     Home Medications Prior to Admission medications   Medication Sig Start Date End Date Taking? Authorizing Provider  acetaminophen (TYLENOL) 325 MG tablet Take 650 mg by mouth as needed for mild pain or moderate pain. 09/30/22   [provider]  carvedilol (COREG) 25 MG tablet Take 1 tablet (25 mg total) by mouth 2 (two) times daily with a meal. 03/12/21   Ghimire, Werner Lean, MD  cinacalcet (SENSIPAR) 30 MG tablet Take 30 mg by mouth daily.    [provider]  cyclobenzaprine (FLEXERIL) 10 MG tablet Take 1 tablet (10 mg total) by mouth 2 (two) times daily as needed for muscle spasms. 04/03/23   Kathleen Lime, MD  Darbepoetin Alfa (ARANESP) 150 MCG/0.3ML SOSY injection Inject 0.3 mLs (150 mcg total) into the skin every Monday at 6 PM. Given with dialysis 04/09/23   Kathleen Lime, MD  diclofenac Sodium (VOLTAREN) 1 % GEL Apply 4 g topically 4 (four) times daily. 04/03/23   Kathleen Lime, MD  feeding supplement (ENSURE ENLIVE / ENSURE PLUS) LIQD Take 237 mLs by mouth 2 (two) times daily between meals. 04/24/23   Kathleen Lime, MD  gabapentin (NEURONTIN) 100 MG capsule Take 100 mg by mouth 3 (three) times  daily. 03/24/23   [provider]  insulin aspart (NOVOLOG) 100 UNIT/ML injection Inject 5 Units into the skin 3 (three) times daily before meals. 03/12/21   Ghimire, Werner Lean, MD  lanthanum (FOSRENOL) 1000 MG chewable tablet Chew 1,000 mg by mouth 5 (five) times daily.    [provider]  lidocaine (LIDODERM) 5 % Place 1 patch onto the skin daily. Remove & Discard patch within 12 hours or as directed by MD 04/04/23   Kathleen Lime, MD  prochlorperazine (COMPAZINE) 5 MG tablet Take 1 tablet (5 mg total) by mouth every 6 (six) hours as needed for up to 5 days for nausea or vomiting. 04/23/23 04/28/23  Kathleen Lime, MD  sertraline (ZOLOFT) 50 MG tablet Take 50 mg by mouth daily. 03/26/23   [provider]  VELPHORO 500 MG chewable tablet Chew 500 mg by mouth 2 (two) times daily. 04/05/23   [provider]      Allergies    Patient has no known allergies.    Review of Systems   Review of Systems  Physical Exam Updated Vital Signs BP 137/74   Pulse 77   Temp 98.7 F (37.1 C) (Oral)   Resp 13   Ht 1.956 m (6\' 5" )   Wt 119.3 kg   SpO2 97%   BMI 31.19 kg/m  Physical Exam Vitals and nursing note reviewed.  Constitutional:      General: He is not in acute distress.  Appearance: He is ill-appearing.     Comments: Sats 86-88% improved with oxygen [place]  HENT:     Head: Normocephalic.     Right Ear: External ear normal.     Left Ear: External ear normal.     Nose: Nose normal.     Mouth/Throat:     Pharynx: Oropharynx is clear.  Eyes:     Pupils: Pupils are equal, round, and reactive to light.  Cardiovascular:     Rate and Rhythm: Normal rate and regular rhythm.     Pulses: Normal pulses.     Heart sounds: Normal heart sounds.  Pulmonary:     Effort: Pulmonary effort is normal.     Breath sounds: Normal breath sounds.  Abdominal:     General: There is distension.     Palpations: Abdomen is soft.  Musculoskeletal:     Cervical back: Normal range  of motion.     Right lower leg: Edema present.     Left lower leg: Edema present.  Skin:    General: Skin is warm and dry.  Neurological:     Mental Status: He is alert.     Comments: Patient falls asleep during exam but wakes easily  Psychiatric:        Mood and Affect: Mood normal.        Behavior: Behavior normal.     ED Results / Procedures / Treatments   Labs (all labs ordered are listed, but only abnormal results are displayed) Labs Reviewed  CBC - Abnormal; Notable for the following components:      Result Value   RBC 3.20 (*)    Hemoglobin 8.9 (*)    HCT 29.7 (*)    RDW 17.1 (*)    All other components within normal limits  COMPREHENSIVE METABOLIC PANEL - Abnormal; Notable for the following components:   Potassium 5.9 (*)    Chloride 97 (*)    Glucose, Bld 132 (*)    BUN 66 (*)    Creatinine, Ser 9.55 (*)    Albumin 2.3 (*)    AST 43 (*)    Alkaline Phosphatase 256 (*)    Total Bilirubin 1.5 (*)    GFR, Estimated 7 (*)    All other components within normal limits  I-STAT VENOUS BLOOD GAS, ED - Abnormal; Notable for the following components:   pCO2, Ven 64.6 (*)    pO2, Ven 160 (*)    Bicarbonate 39.2 (*)    TCO2 41 (*)    Acid-Base Excess 12.0 (*)    Potassium >8.5 (*)    Calcium, Ion 1.07 (*)    HCT 30.0 (*)    Hemoglobin 10.2 (*)    All other components within normal limits  RESP PANEL BY RT-PCR (RSV, FLU A&B, COVID)  RVPGX2  CULTURE, BLOOD (ROUTINE X 2)  CULTURE, BLOOD (ROUTINE X 2)  CK  HEPATITIS B SURFACE ANTIGEN  HEPATITIS B SURFACE ANTIBODY, QUANTITATIVE  I-STAT CG4 LACTIC ACID, ED    EKG None  Radiology DG Chest Port 1 View  Result Date: 08/04/2023 CLINICAL DATA:  Dyspnea EXAM: PORTABLE CHEST 1 VIEW COMPARISON:  Chest radiograph dated 07/10/2023 FINDINGS: Markedly low lung volumes. Bilateral diffuse interstitial and patchy opacities. Dense bibasilar opacities. Similar moderate bilateral pleural effusions. Similar enlarged  cardiomediastinal silhouette. No acute osseous abnormality. IMPRESSION: 1. Markedly low lung volumes with bilateral diffuse interstitial and patchy opacities, likely pulmonary edema. 2. Similar moderate bilateral pleural effusions with dense bibasilar opacities, likely atelectasis.  3. Similar cardiomegaly. Electronically Signed   By: Agustin Cree M.D.   On: 08/04/2023 12:11    Procedures .Critical Care  Performed by: Margarita Grizzle, MD Authorized by: Margarita Grizzle, MD   Critical care provider statement:    Critical care time (minutes):  30   Critical care was time spent personally by me on the following activities:  Development of treatment plan with patient or surrogate, discussions with consultants, evaluation of patient's response to treatment, examination of patient, ordering and review of laboratory studies, ordering and review of radiographic studies, ordering and performing treatments and interventions, pulse oximetry, re-evaluation of patient's condition and review of old charts     Medications Ordered in ED Medications  Chlorhexidine Gluconate Cloth 2 % PADS 6 each (has no administration in time range)  pentafluoroprop-tetrafluoroeth (GEBAUERS) aerosol 1 Application (has no administration in time range)  lidocaine (PF) (XYLOCAINE) 1 % injection 5 mL (has no administration in time range)  lidocaine-prilocaine (EMLA) cream 1 Application (has no administration in time range)  heparin injection 1,000 Units (has no administration in time range)  anticoagulant sodium citrate solution 5 mL (has no administration in time range)  alteplase (CATHFLO ACTIVASE) injection 2 mg (has no administration in time range)  heparin injection 10,000 Units (has no administration in time range)  heparin sodium (porcine) 1000 UNIT/ML injection (has no administration in time range)  sodium zirconium cyclosilicate (LOKELMA) packet 5 g (has no administration in time range)    ED Course/ Medical Decision Making/  A&P                                 Medical Decision Making Amount and/or Complexity of Data Reviewed Labs: ordered. Radiology: ordered.  Risk Prescription drug management.   1- dyspnea- hypoxemic/hypercarbic respiratory failure Volume overload vs infection Patient last dialyzed 2 days ago CXR c.w. volume overload Pe c.w. volume overload 2- myalgias- CK normal, no fever or hypotension here, lactic normal, unclear etiology 3 hyperkalemia- potassium 5.9, EKG pending lokelma ordered  Consult  to nephrology-discussed with Dr. Kathrene Bongo who will see for dialysis Consult to hospitalist. D.w. Dr. Burnett Sheng who will see for admission         Final Clinical Impression(s) / ED Diagnoses Final diagnoses:  Acute respiratory failure with hypoxia and hypercapnia [facility])    Rx / DC Orders ED Discharge Orders     None         Margarita Grizzle, MD 08/04/23 1347

## 2023-08-04 NOTE — ED Notes (Signed)
Pt reporting generalized pain "all over" 10/10 ongoing x1 wk.

## 2023-08-04 NOTE — ED Notes (Signed)
Pt reports he doesn't produce urine and is unable to complete UA.

## 2023-08-04 NOTE — Progress Notes (Signed)
Pt was transferred from hemodialysis department to 3E28. Alert and fully oriented x 4, afebrile, stable hemodynamically, on 2-4 LPM of O2 NCL, normal respiratory rate and efforts at rest, SPO2 98%, no acute distress at arrival.   Pt reported SOB with exertion. His home baseline O2 at 4 LPM. His mobility has been limited and requires 2 persons assisted to turn and move on his bed. Legs 4+ pitting edema bilaterally. Skin assessment was done with 2nd RN per protocol. Call bell in reachable, equipments in room oriented, CHG bath given.   Pt requested Percocet for his leg pain. He refused Tylenol. Pt stated Tylenol did nothing for him. He stated his PCP prescribed Percocet for his leg pain. Dr. Arlean Hopping notified and order was received for PRN Percocet at bedtime.   Pt refused to continue to monitor O2 sat, and refused EKG monitor. He refused Heparin SQ. Education was provided. Plan of care was reviewed with Pt.  Pt agreed to get EKG wall monitoring, not an EKG tele-box. After getting education, he persistently refused to get Heparin SQ. We continue to monitor and will hand off to the day shift team.    Bobby Pinks, RN

## 2023-08-04 NOTE — ED Notes (Signed)
ED Provider at bedside. 

## 2023-08-05 ENCOUNTER — Inpatient Hospital Stay (HOSPITAL_COMMUNITY): Payer: Medicare Other

## 2023-08-05 DIAGNOSIS — E871 Hypo-osmolality and hyponatremia: Secondary | ICD-10-CM | POA: Diagnosis present

## 2023-08-05 DIAGNOSIS — E10319 Type 1 diabetes mellitus with unspecified diabetic retinopathy without macular edema: Secondary | ICD-10-CM | POA: Diagnosis present

## 2023-08-05 DIAGNOSIS — N2581 Secondary hyperparathyroidism of renal origin: Secondary | ICD-10-CM | POA: Diagnosis present

## 2023-08-05 DIAGNOSIS — J9622 Acute and chronic respiratory failure with hypercapnia: Secondary | ICD-10-CM | POA: Diagnosis present

## 2023-08-05 DIAGNOSIS — Z992 Dependence on renal dialysis: Secondary | ICD-10-CM | POA: Diagnosis not present

## 2023-08-05 DIAGNOSIS — D631 Anemia in chronic kidney disease: Secondary | ICD-10-CM | POA: Diagnosis present

## 2023-08-05 DIAGNOSIS — J9601 Acute respiratory failure with hypoxia: Secondary | ICD-10-CM | POA: Diagnosis not present

## 2023-08-05 DIAGNOSIS — E1065 Type 1 diabetes mellitus with hyperglycemia: Secondary | ICD-10-CM | POA: Diagnosis present

## 2023-08-05 DIAGNOSIS — J9621 Acute and chronic respiratory failure with hypoxia: Secondary | ICD-10-CM | POA: Diagnosis present

## 2023-08-05 DIAGNOSIS — M25062 Hemarthrosis, left knee: Secondary | ICD-10-CM | POA: Diagnosis present

## 2023-08-05 DIAGNOSIS — K7460 Unspecified cirrhosis of liver: Secondary | ICD-10-CM | POA: Diagnosis present

## 2023-08-05 DIAGNOSIS — E8809 Other disorders of plasma-protein metabolism, not elsewhere classified: Secondary | ICD-10-CM | POA: Diagnosis present

## 2023-08-05 DIAGNOSIS — I132 Hypertensive heart and chronic kidney disease with heart failure and with stage 5 chronic kidney disease, or end stage renal disease: Secondary | ICD-10-CM | POA: Diagnosis present

## 2023-08-05 DIAGNOSIS — J9602 Acute respiratory failure with hypercapnia: Secondary | ICD-10-CM | POA: Diagnosis not present

## 2023-08-05 DIAGNOSIS — I3139 Other pericardial effusion (noninflammatory): Secondary | ICD-10-CM | POA: Diagnosis present

## 2023-08-05 DIAGNOSIS — E662 Morbid (severe) obesity with alveolar hypoventilation: Secondary | ICD-10-CM | POA: Diagnosis present

## 2023-08-05 DIAGNOSIS — I5023 Acute on chronic systolic (congestive) heart failure: Secondary | ICD-10-CM | POA: Diagnosis present

## 2023-08-05 DIAGNOSIS — G9341 Metabolic encephalopathy: Secondary | ICD-10-CM | POA: Diagnosis present

## 2023-08-05 DIAGNOSIS — N186 End stage renal disease: Secondary | ICD-10-CM | POA: Diagnosis present

## 2023-08-05 DIAGNOSIS — D509 Iron deficiency anemia, unspecified: Secondary | ICD-10-CM | POA: Diagnosis present

## 2023-08-05 DIAGNOSIS — J810 Acute pulmonary edema: Secondary | ICD-10-CM

## 2023-08-05 DIAGNOSIS — E8770 Fluid overload, unspecified: Secondary | ICD-10-CM | POA: Diagnosis present

## 2023-08-05 DIAGNOSIS — E1022 Type 1 diabetes mellitus with diabetic chronic kidney disease: Secondary | ICD-10-CM | POA: Diagnosis present

## 2023-08-05 DIAGNOSIS — R609 Edema, unspecified: Secondary | ICD-10-CM | POA: Diagnosis not present

## 2023-08-05 DIAGNOSIS — I10 Essential (primary) hypertension: Secondary | ICD-10-CM | POA: Diagnosis not present

## 2023-08-05 DIAGNOSIS — R188 Other ascites: Secondary | ICD-10-CM | POA: Diagnosis present

## 2023-08-05 DIAGNOSIS — Z794 Long term (current) use of insulin: Secondary | ICD-10-CM | POA: Diagnosis not present

## 2023-08-05 DIAGNOSIS — Z1152 Encounter for screening for COVID-19: Secondary | ICD-10-CM | POA: Diagnosis not present

## 2023-08-05 LAB — RENAL FUNCTION PANEL
Albumin: 2.2 g/dL — ABNORMAL LOW (ref 3.5–5.0)
Anion gap: 12 (ref 5–15)
BUN: 48 mg/dL — ABNORMAL HIGH (ref 6–20)
CO2: 31 mmol/L (ref 22–32)
Calcium: 9.4 mg/dL (ref 8.9–10.3)
Chloride: 96 mmol/L — ABNORMAL LOW (ref 98–111)
Creatinine, Ser: 7.42 mg/dL — ABNORMAL HIGH (ref 0.61–1.24)
GFR, Estimated: 9 mL/min — ABNORMAL LOW (ref >60)
Glucose, Bld: 154 mg/dL — ABNORMAL HIGH (ref 70–99)
Phosphorus: 7.6 mg/dL — ABNORMAL HIGH (ref 2.5–4.6)
Potassium: 4.4 mmol/L (ref 3.5–5.1)
Sodium: 139 mmol/L (ref 135–145)

## 2023-08-05 LAB — HEMOGLOBIN A1C
Hgb A1c MFr Bld: 6.2 % — ABNORMAL HIGH (ref 4.8–5.6)
Mean Plasma Glucose: 131.24 mg/dL

## 2023-08-05 LAB — GLUCOSE, CAPILLARY
Glucose-Capillary: 128 mg/dL — ABNORMAL HIGH (ref 70–99)
Glucose-Capillary: 151 mg/dL — ABNORMAL HIGH (ref 70–99)
Glucose-Capillary: 154 mg/dL — ABNORMAL HIGH (ref 70–99)
Glucose-Capillary: 147 mg/dL — ABNORMAL HIGH (ref 70–99)

## 2023-08-05 MED ORDER — DARBEPOETIN ALFA 200 MCG/0.4ML IJ SOSY
200.0000 ug | PREFILLED_SYRINGE | INTRAMUSCULAR | Status: DC
  Administered 2023-08-06 – 2023-08-20 (×3): 200 ug via SUBCUTANEOUS
  Filled 2023-08-05 (×5): qty 0.4

## 2023-08-05 MED ORDER — OXYCODONE-ACETAMINOPHEN 5-325 MG PO TABS
2.0000 | ORAL_TABLET | Freq: Four times a day (QID) | ORAL | Status: DC | PRN
  Administered 2023-08-05 – 2023-08-08 (×9): 2 via ORAL
  Filled 2023-08-05 (×10): qty 2

## 2023-08-05 MED ORDER — NEPRO/CARBSTEADY PO LIQD
237.0000 mL | Freq: Two times a day (BID) | ORAL | Status: DC
  Administered 2023-08-05 – 2023-08-28 (×42): 237 mL via ORAL
  Filled 2023-08-05 (×4): qty 237

## 2023-08-05 MED ORDER — NALOXONE HCL 0.4 MG/ML IJ SOLN: 0.4000 mg | INTRAMUSCULAR | Status: DC | PRN

## 2023-08-05 MED ORDER — HYDROMORPHONE HCL 1 MG/ML IJ SOLN
0.5000 mg | INTRAMUSCULAR | Status: DC | PRN
  Administered 2023-08-05 – 2023-08-07 (×9): 0.5 mg via INTRAVENOUS
  Filled 2023-08-05 (×9): qty 0.5

## 2023-08-05 MED ORDER — OXYCODONE-ACETAMINOPHEN 5-325 MG PO TABS: 2.0000 | ORAL_TABLET | Freq: Every evening | ORAL | Status: DC | PRN

## 2023-08-05 MED ORDER — LIDOCAINE HCL (PF) 1 % IJ SOLN
10.0000 mL | Freq: Once | INTRAMUSCULAR | Status: AC
  Administered 2023-08-05: 10 mL via INTRADERMAL

## 2023-08-05 MED ORDER — SUCROFERRIC OXYHYDROXIDE 500 MG PO CHEW
1000.0000 mg | CHEWABLE_TABLET | Freq: Three times a day (TID) | ORAL | Status: DC
  Administered 2023-08-05 – 2023-08-22 (×11): 1000 mg via ORAL
  Filled 2023-08-05 (×20): qty 2

## 2023-08-05 MED ORDER — CHLORHEXIDINE GLUCONATE CLOTH 2 % EX PADS
6.0000 | MEDICATED_PAD | Freq: Every day | CUTANEOUS | Status: DC
  Administered 2023-08-05 – 2023-08-06 (×2): 6 via TOPICAL

## 2023-08-05 NOTE — Evaluation (Signed)
Physical Therapy Evaluation Patient Details Name: Bobby [name] MRN: [id number] DOB: 07-12-1996 Today's Date: 08/05/2023  History of Present Illness  Pt is 27 yo presenting to [facility] ED with diffuse muscle aches after missing HD session on Tuesday. Also reporting dyspnea on exertion, worsening abdominal swelling and leg swelling. Pt was able to get HD on Thursday with 6L removed. PMH includes ESRD (HD TTS), HFrEF, type 1 diabetes, HTN, obesity.  Clinical Impression  Pt presenting from [facility] in [facility]. Pt was in rehab due to L meniscal tear. Pt reports that he was working with physical therapy on walking and was up to 40-60 ft with assistance intermittently due to balance and weakness in the bil LE. Pt reports that he intermittently needs assist with transfers. He reportedly was transferring at [facility] to [facility] and required assistance with dressing/bathing. Pt states he has 24/7 assistance from sister and nephew at home and wants to return home on discharge from acute care hospital setting. Pt refused out of bed mobility and would like to be discharged from skilled physical therapy services in the acute hospital setting. Due to pt current reported level of function, home set up and available assistance at home recommending skilled physical therapy services 3x/weekly on discharge from acute care hospital setting. If pt sister and nephew are unable to assist pt may need to return to skilled physical therapy services < 3 hours/day at [facility]. Pt will be discharged upon request at this time; please re-consult if further needs arise.          If plan is discharge home, recommend the following: A little help with walking and/or transfers;Assistance with cooking/housework;Assist for transportation;Help with stairs or ramp for entrance     Equipment Recommendations None recommended by PT     Functional Status Assessment Patient has not had a recent decline in their functional status      Precautions / Restrictions Precautions Precautions: Fall Restrictions Weight Bearing Restrictions: No      Mobility  Bed Mobility   General bed mobility comments: pt refused mobility       Pertinent Vitals/Pain Pain Assessment Pain Assessment: Faces Faces Pain Scale: No hurt    Home Living Family/patient expects to be discharged to:: Private residence Living Arrangements: Other relatives (sister and 38 yo nephew) Available Help at Discharge: Family;Available 24 hours/day Type of Home: House Home Access: Stairs to enter Entrance Stairs-Rails: None Entrance Stairs-Number of Steps: 1   Home Layout: One level Home Equipment: Tub bench;Shower seat;BSC/3in1;Wheelchair - manual;Other (comment);Rollator (4 wheels) (O2 at home)      Prior Function Prior Level of Function : Needs assist       Physical Assist : Mobility (physical);ADLs (physical) Mobility (physical): Transfers;Gait ADLs (physical): Bathing;Dressing Mobility Comments: Pt states he was using 4 wheel walker for ambulation with intermittent assistance due to balance and bil LE weakness. He was requiring intermittent assistance with stand pivot transfers due to balance and LE weakness. He reports he was able to ambulate 40-60 ft at Rehab prior to hospitalization. ADLs Comments: Pt states he was transfering to [facility] at [facility] and requires assistance with dressing and bathing.     Extremity/Trunk Assessment   Upper Extremity Assessment Upper Extremity Assessment: Generalized weakness (pt able to lift arms against gravity)    Lower Extremity Assessment Lower Extremity Assessment: Generalized weakness;RLE deficits/detail;LLE deficits/detail RLE Deficits / Details: 4/5 PF, knee flex/ext and hip flex. 3/5 ankle DF LLE Deficits / Details: 4/5 PF, knee  flex and hip flex. 3/5 ankle DF and knee ext       Communication   Communication Communication: No apparent difficulties  Cognition Arousal: Alert Behavior  During Therapy: WFL for tasks assessed/performed, Agitated, Flat affect Overall Cognitive Status: Within Functional Limits for tasks assessed          General Comments General comments (skin integrity, edema, etc.): Pt has edema in bil LE with swelling greatest at the bil feet and lower legs.        Assessment/Plan    PT Assessment All further PT needs can be met in the next venue of care  PT Problem List Decreased mobility;Decreased strength           PT Goals (Current goals can be found in the Care Plan section)  Acute Rehab PT Goals Patient Stated Goal: to return home PT Goal Formulation: With patient Time For Goal Achievement: 08/05/23 Potential to Achieve Goals: Fair         AM-PAC PT "6 Clicks" Mobility  Outcome Measure Help needed turning from your back to your side while in a flat bed without using bedrails?: A Little Help needed moving from lying on your back to sitting on the side of a flat bed without using bedrails?: A Little Help needed moving to and from a bed to a chair (including a wheelchair)?: A Lot Help needed standing up from a chair using your arms (e.g., wheelchair or bedside chair)?: A Lot Help needed to walk in hospital room?: Total Help needed climbing 3-5 steps with a railing? : Total 6 Click Score: 12    End of Session   Activity Tolerance: Treatment limited secondary to agitation Patient left: in bed;with call bell/phone within reach Nurse Communication: Mobility status      Time: 3235-5732 PT Time Calculation (min) (ACUTE ONLY): 15 min   Charges:   PT Evaluation $PT Eval Low Complexity: 1 Low   PT General Charges $$ ACUTE PT VISIT: 1 Visit        Harrel Carina, DPT, CLT  Acute Rehabilitation Services Office: [phone] (Secure chat preferred)   Claudia Desanctis 08/05/2023, 10:07 AM

## 2023-08-05 NOTE — Progress Notes (Signed)
Pt has been agitated. He refuses to wear a heart monitor at this time and refuses to wear any cloth or hospital gown.   Filiberto Pinks, RN

## 2023-08-05 NOTE — Progress Notes (Signed)
Pt refuses RN to put sacral foam for pressure sore prevention. Position changed q 2 hrs is encouraged. Pt is able to sit and dangle at bedside with one assistance. PT consulted is pending.   Filiberto Pinks, RN

## 2023-08-05 NOTE — Procedures (Signed)
   IR procedure  US guided RLQ paracentesis  3L amber fluid No labs per MD  Tolerated well  EBL:  1 cc

## 2023-08-05 NOTE — Care Management (Signed)
Attempted to set up [facility]- declined [facility]- declined [facility]- declined [facility]- declined  Referral made to [facility] for OP PT and OT

## 2023-08-05 NOTE — Progress Notes (Signed)
Subjective:  HD late last night-  removed 5 liters but still overloaded-  noted agitation overnight-  still with healthy O2 requirement -  s/p paracentesis of 3 liters as well   Objective Vital signs in last 24 hours: Vitals:   08/04/23 2137 08/04/23 2139 08/05/23 0008 08/05/23 0519  BP:   (!) 141/79 128/67  Pulse: 84 84 85 81  Resp: 14 13 15 15   Temp:   98 F (36.7 C) 97.9 F (36.6 C)  TempSrc:   Oral Oral  SpO2: 94% 96% 100% 100%  Weight:      Height:       Weight change:   Intake/Output Summary (Last 24 hours) at 08/05/2023 0805 Last data filed at 08/05/2023 1610 Gross per 24 hour  Intake 800 ml  Output 5000 ml  Net -4200 ml    Dialysis Orders:  HP TTS 4:15 500/A2x EDW 112 kg 2K/2Ca AVF Heparin 96045 U Last HD 11/28 - post HD wt 146 kg  Mircera 200 q 2 weeks (last 11/19) Venofer 100 x 10  Hectorol 8 q TIW   Assessment/Plan: Profound volume overload - Chronic issue.  > 30 kg over EDW on presentation. Will need serial HD for volume removal  Hypercapnia - per primary -  likely has OSA- not sure if ever treated ESRD -  HD TTS as OP via AVF. Serial HD as above. Plan for HD  tomorrow and Tuesday  Hyperkalemia - has corrected with HD Hypertension/volume  - BP acceptable. Massive volume overload as above Anemia  - Hgb 8.9 on ESA- continue here. Continue IV Fe here if Bcx negative  Metabolic bone disease -  Continue home binders-  velphoro. Follow Ca/Phos here  Recurrent ascites - Defer to primary -  s/p paracentesis 12/2 of 3 liters  Nutrition - Renal diet with fluid restriction     Bobby [name]    Labs: Basic Metabolic Panel: Recent Labs  Lab 08/04/23 1135 08/04/23 1217 08/05/23 0235  NA 142 137 139  K 5.9* >8.5* 4.4  CL 97*  --  96*  CO2 32  --  31  GLUCOSE 132*  --  154*  BUN 66*  --  48*  CREATININE 9.55*  --  7.42*  CALCIUM 9.6  --  9.4  PHOS  --   --  7.6*   Liver Function Tests: Recent Labs  Lab 08/04/23 1135 08/05/23 0235  AST 43*  --    ALT 35  --   ALKPHOS 256*  --   BILITOT 1.5*  --   PROT 7.9  --   ALBUMIN 2.3* 2.2*   No results for input(s): "LIPASE", "AMYLASE" in the last 168 hours. No results for input(s): "AMMONIA" in the last 168 hours. CBC: Recent Labs  Lab 08/04/23 1135 08/04/23 1217  WBC 5.4  --   HGB 8.9* 10.2*  HCT 29.7* 30.0*  MCV 92.8  --   PLT 231  --    Cardiac Enzymes: Recent Labs  Lab 08/04/23 1135  CKTOTAL 253   CBG: Recent Labs  Lab 08/04/23 2237 08/05/23 0612  GLUCAP 180* 128*    Iron Studies: No results for input(s): "IRON", "TIBC", "TRANSFERRIN", "FERRITIN" in the last 72 hours. Studies/Results: DG Chest Port 1 View  Result Date: 08/04/2023 CLINICAL DATA:  Dyspnea EXAM: PORTABLE CHEST 1 VIEW COMPARISON:  Chest radiograph dated 07/10/2023 FINDINGS: Markedly low lung volumes. Bilateral diffuse interstitial and patchy opacities. Dense bibasilar opacities. Similar moderate bilateral pleural effusions. Similar enlarged cardiomediastinal  silhouette. No acute osseous abnormality. IMPRESSION: 1. Markedly low lung volumes with bilateral diffuse interstitial and patchy opacities, likely pulmonary edema. 2. Similar moderate bilateral pleural effusions with dense bibasilar opacities, likely atelectasis. 3. Similar cardiomegaly. Electronically Signed   By: Agustin Cree M.D.   On: 08/04/2023 12:11   Medications: Infusions:   Scheduled Medications:  carvedilol  25 mg Oral BID WC   Chlorhexidine Gluconate Cloth  6 each Topical Q0600   feeding supplement  237 mL Oral BID BM   gabapentin  100 mg Oral TID   heparin  5,000 Units Subcutaneous Q8H   hydrALAZINE  100 mg Oral TID   insulin aspart  0-15 Units Subcutaneous TID WC   insulin aspart  0-5 Units Subcutaneous QHS   isosorbide mononitrate  90 mg Oral Daily   loratadine  10 mg Oral Daily   sodium zirconium cyclosilicate  5 g Oral NOW    have reviewed scheduled and prn medications.  Physical Exam: General: obese BM-  not very  interactive  Heart: RRR Lungs: dec BS at bases Abdomen: distended Extremities: chronic pitting edema Dialysis Access: left AVF    08/05/2023,8:05 AM  LOS: 1 day

## 2023-08-05 NOTE — Progress Notes (Signed)
OT Cancellation Note  Patient Details Name: Bobby [name] MRN: [id number] DOB: 19-Sep-1995   Cancelled Treatment:    Reason Eval/Treat Not Completed: Patient declined, no reason specified;Other (comment) (Per PT and pt, pt does not want to participate in acute therapy services and would like to be discharged from caseload. He is agreeable to participate with HH therapies and wishes to discharge home with supoprt of his family. OT will sign off, please re-order if pt becomes agreeable.)  Donia Pounds 08/05/2023, 2:04 PM

## 2023-08-05 NOTE — Progress Notes (Addendum)
PROGRESS NOTE  Bobby [name] ZOX:[id number] DOB: Sep 08, 1995   PCP: [facility]  Patient is from: Home.  Lives with sister.  Uses rolling walker.  DOA: 08/04/2023 LOS: 1  Chief complaints Chief Complaint  Patient presents with   Generalized Body Aches     Brief Narrative / Interim history: 27 year old M with PMH of ESRD on HD TTS, morbid obesity, HFimpEF, DM-1, recurrent ascites requiring paracentesis, HTN, and anxiety presented to ED with myalgia, SOB, DOE, abdominal swelling and edema, and admitted with volume overload with pulmonary edema, acute respiratory failure with hypoxia and hypercapnia and hyperkalemia.  Reportedly missed his HD on Tuesday but underwent HD on Thursday with 6 L of fluid removal.  In the ED, slightly hypertensive with mild tachypnea.  Hypoxic to 80s and placed on 2 L by nasal cannula.  K5 0.9%.  VBG 7.39/64.6/160/39.2.  COVID-19 and influenza PCR nonreactive.  CXR showed bilateral diffuse interstitial and patchy opacities concerning for pulmonary edema as well as moderate bilateral pleural effusions.  Nephrology consulted.  Patient underwent hemodialysis with ultrafiltration of 5 L.   Underwent paracentesis with removal of 3 L on 12/1.  Nephrology recommending serial HD for volume removal.   Subjective: Seen and examined earlier this morning.  Reportedly agitated and refused to wear cardiac monitors and clohtes overnight.  No complaint this morning other than feeling sore in his abdomen.  He just had paracentesis with removal of 3 L.  Denies chest pain, cough, shortness of breath or GI symptoms.  Objective: Vitals:   08/05/23 0738 08/05/23 0753 08/05/23 0944 08/05/23 0945  BP: 139/78 129/68    Pulse:   76   Resp:      Temp:      TempSrc:      SpO2:   100% 98%  Weight:      Height:        Examination:  GENERAL: No apparent distress.  Nontoxic. HEENT: MMM.  Vision and hearing grossly intact.  NECK: Supple.  No apparent  JVD.  RESP:  No IWOB.  Fair aeration bilaterally but limited exam due to body habitus. CVS:  RRR. Heart sounds normal.  ABD/GI/GU: BS+. Abd soft, NTND.  Limited exam due to body habitus MSK/EXT:  Moves extremities. No apparent deformity.  Difficult to assess edema SKIN: no apparent skin lesion or wound NEURO: Awake, alert and oriented appropriately.  No apparent focal neuro deficit. PSYCH: Calm. Normal affect.   Procedures:  12/1-paracentesis with removal of 3 L amber fluid  Microbiology summarized: COVID-19, influenza and RSV PCR nonreactive Blood cultures NGTD  Assessment and plan: Acute respiratory failure with hypoxia and chronic hypercapnia: Multifactorial including pulmonary edema, undiagnosed OSA/OHS.  Desaturated to 88% on RA and started on 2 L.  VBG 7.39/64.6/160/39.2.  Suggesting chronic respiratory acidosis/CO2 retention likely from underlying OSA/OHS. -Fluid management per nephrology with hemodialysis -Minimum oxygen to keep saturation above 90%.  Patient is at high risk for CO2 retention.  Discussed with RN. -BiPAP if he is willing to wear  Acute on chronic HFimpEF/pulmonary edema/ESRD on HD TTS: TTE in 03/2023 with LVEF of 30 to 35%, G1 DD, severe LAE and RAE, moderate pericardial effusion and moderate TR. TTE in 06/2023 at [facility] with LVEF of 55 to 60% and a small pericardial effusion without significant valvular stenosis or regurgitation. -S/p ultrafiltration with removal of 5 L on 11/30. -Defer to nephrology-recommend serial HD for volume removal.  Liver cirrhosis with recurrent ascites: Unclear cause of liver cirrhosis.  Needs  paracentesis biweekly. -S/p paracentesis with removal of 3 L on 12/1.  Muscle aches/myalgia/possible chronic pain-seems to feel.  He ended.  Last fill for Norco 10/325 on 06/25/2023, #118 for 28 days.  Also on Flexeril. -Continue Norco and Flexeril  Type 1 diabetes: A1c 6.2%. Recent Labs  Lab 08/04/23 2237 08/05/23 0612  GLUCAP 180* 128*   -Continue current insulin regimen  Essential hypertension: Normotensive -Fluid removal with dialysis -Continue hydralazine, Coreg and Imdur   Hyperkalemia: Resolved with HD   Anxiety stable -New home Atarax  At risk for sleep apnea/OHS -Needs outpatient evaluation -BiPAP as needed  Generalized weakness: Lives with sister.  Reports using rolling walker at baseline -PT/OT  Morbid obesity Body mass index is 37.78 kg/m.          DVT prophylaxis:  heparin injection 5,000 Units Start: 08/04/23 1515  Code Status: Full code Family Communication: None at bedside Level of care: Progressive Status is: Inpatient Remains inpatient appropriate because: Volume overload   Final disposition: Home Consultants:  Nephrology  55 minutes with more than 50% spent in reviewing records, counseling patient/family and coordinating care.   Sch Meds:  Scheduled Meds:  carvedilol  25 mg Oral BID WC   Chlorhexidine Gluconate Cloth  6 each Topical Q0600   Chlorhexidine Gluconate Cloth  6 each Topical Q0600   [START ON 08/06/2023] darbepoetin (ARANESP) injection - DIALYSIS  200 mcg Subcutaneous Q Mon-1800   feeding supplement  237 mL Oral BID BM   feeding supplement (NEPRO CARB STEADY)  237 mL Oral BID BM   gabapentin  100 mg Oral TID   heparin  5,000 Units Subcutaneous Q8H   hydrALAZINE  100 mg Oral TID   insulin aspart  0-15 Units Subcutaneous TID WC   insulin aspart  0-5 Units Subcutaneous QHS   isosorbide mononitrate  90 mg Oral Daily   loratadine  10 mg Oral Daily   sucroferric oxyhydroxide  1,000 mg Oral TID WC   Continuous Infusions: PRN Meds:.acetaminophen **OR** acetaminophen, albuterol, cyclobenzaprine, hydrOXYzine, ondansetron **OR** ondansetron (ZOFRAN) IV, mouth rinse, oxyCODONE-acetaminophen, senna-docusate  Antimicrobials: Anti-infectives (From admission, onward)    None        I have personally reviewed the following labs and images: CBC: Recent Labs  Lab  08/04/23 1135 08/04/23 1217  WBC 5.4  --   HGB 8.9* 10.2*  HCT 29.7* 30.0*  MCV 92.8  --   PLT 231  --    BMP &GFR Recent Labs  Lab 08/04/23 1135 08/04/23 1217 08/05/23 0235  NA 142 137 139  K 5.9* >8.5* 4.4  CL 97*  --  96*  CO2 32  --  31  GLUCOSE 132*  --  154*  BUN 66*  --  48*  CREATININE 9.55*  --  7.42*  CALCIUM 9.6  --  9.4  PHOS  --   --  7.6*   Estimated Creatinine Clearance: 23.3 mL/min (A) (by C-G formula based on SCr of 7.42 mg/dL (H)). Liver & Pancreas: Recent Labs  Lab 08/04/23 1135 08/05/23 0235  AST 43*  --   ALT 35  --   ALKPHOS 256*  --   BILITOT 1.5*  --   PROT 7.9  --   ALBUMIN 2.3* 2.2*   No results for input(s): "LIPASE", "AMYLASE" in the last 168 hours. No results for input(s): "AMMONIA" in the last 168 hours. Diabetic: Recent Labs    08/05/23 0235  HGBA1C 6.2*   Recent Labs  Lab 08/04/23 2237 08/05/23 6578  GLUCAP 180* 128*   Cardiac Enzymes: Recent Labs  Lab 08/04/23 1135  CKTOTAL 253   No results for input(s): "PROBNP" in the last 8760 hours. Coagulation Profile: No results for input(s): "INR", "PROTIME" in the last 168 hours. Thyroid Function Tests: No results for input(s): "TSH", "T4TOTAL", "FREET4", "T3FREE", "THYROIDAB" in the last 72 hours. Lipid Profile: No results for input(s): "CHOL", "HDL", "LDLCALC", "TRIG", "CHOLHDL", "LDLDIRECT" in the last 72 hours. Anemia Panel: No results for input(s): "VITAMINB12", "FOLATE", "FERRITIN", "TIBC", "IRON", "RETICCTPCT" in the last 72 hours. Urine analysis:    Component Value Date/Time   COLORURINE YELLOW 03/01/2021 0847   APPEARANCEUR CLEAR 03/01/2021 0847   LABSPEC 1.010 03/01/2021 0847   PHURINE 7.0 03/01/2021 0847   GLUCOSEU 150 (A) 03/01/2021 0847   HGBUR SMALL (A) 03/01/2021 0847   BILIRUBINUR NEGATIVE 03/01/2021 0847   KETONESUR 5 (A) 03/01/2021 0847   PROTEINUR >=300 (A) 03/01/2021 0847   NITRITE NEGATIVE 03/01/2021 0847   LEUKOCYTESUR NEGATIVE 03/01/2021  0847   Sepsis Labs: Invalid input(s): "PROCALCITONIN", "LACTICIDVEN"  Microbiology: Recent Results (from the past 240 hour(s))  Blood culture (routine x 2)     Status: None (Preliminary result)   Collection Time: 08/04/23 11:18 AM   Specimen: BLOOD  Result Value Ref Range Status   Specimen Description BLOOD SITE NOT SPECIFIED  Final   Special Requests   Final    BOTTLES DRAWN AEROBIC AND ANAEROBIC Blood Culture adequate volume   Culture   Final    NO GROWTH < 24 HOURS Performed at [facility], [address] [place], [place] [address]    Report Status PENDING  Incomplete  Resp panel by RT-PCR (RSV, Flu A&B, Covid) Anterior Nasal Swab     Status: None   Collection Time: 08/04/23 11:19 AM   Specimen: Anterior Nasal Swab  Result Value Ref Range Status   SARS Coronavirus 2 by RT PCR NEGATIVE NEGATIVE Final   Influenza A by PCR NEGATIVE NEGATIVE Final   Influenza B by PCR NEGATIVE NEGATIVE Final    Comment: (NOTE) The Xpert Xpress SARS-CoV-2/FLU/RSV plus assay is intended as an aid in the diagnosis of influenza from Nasopharyngeal swab specimens and should not be used as a sole basis for treatment. Nasal washings and aspirates are unacceptable for Xpert Xpress SARS-CoV-2/FLU/RSV testing.  Fact Sheet for Patients: BloggerCourse.com  Fact Sheet for Healthcare Providers: SeriousBroker.it  This test is not yet approved or cleared by the [place] FDA and has been authorized for detection and/or diagnosis of SARS-CoV-2 by FDA under an Emergency Use Authorization (EUA). This EUA will remain in effect (meaning this test can be used) for the duration of the COVID-19 declaration under Section 564(b)(1) of the Act, 21 U.S.C. section 360bbb-3(b)(1), unless the authorization is terminated or revoked.     Resp Syncytial Virus by PCR NEGATIVE NEGATIVE Final    Comment: (NOTE) Fact Sheet for  Patients: BloggerCourse.com  Fact Sheet for Healthcare Providers: SeriousBroker.it  This test is not yet approved or cleared by the [place] FDA and has been authorized for detection and/or diagnosis of SARS-CoV-2 by FDA under an Emergency Use Authorization (EUA). This EUA will remain in effect (meaning this test can be used) for the duration of the COVID-19 declaration under Section 564(b)(1) of the Act, 21 U.S.C. section 360bbb-3(b)(1), unless the authorization is terminated or revoked.  Performed at [facility], [address] [place], [place] [address]     Radiology Studies: US Paracentesis  Result Date:  08/05/2023 INDICATION: History of congestive heart failure and end-stage renal disease, now with recurrent symptomatic ascites. Request made for ultrasound-guided paracentesis for therapeutic purposes. EXAM: ULTRASOUND GUIDED PARACENTESIS MEDICATIONS: 10 cc 1% lidocaine. COMPLICATIONS: None immediate. PROCEDURE: Informed written consent was obtained from the patient after a discussion of the risks, benefits and alternatives to treatment. A timeout was performed prior to the initiation of the procedure. Initial ultrasound scanning demonstrates a large amount of ascites within the right lower abdominal quadrant. The right lower abdomen was prepped and draped in the usual sterile fashion. 1% lidocaine was used for local anesthesia. Following this, a 7 cm Yueh catheter was introduced. An ultrasound image was saved for documentation purposes. The paracentesis was performed. The catheter was removed and a dressing was applied. The patient tolerated the procedure well without immediate post procedural complication. Patient received post-procedure intravenous albumin; see nursing notes for details. FINDINGS: A total of approximately 3 Liters of amber fluid was removed. IMPRESSION: Successful ultrasound-guided paracentesis yielding 3  liters of peritoneal fluid. Performed by Robet Leu [facility] Electronically Signed   By: Simonne Come M.D.   On: 08/05/2023 10:02   DG Chest Port 1 View  Result Date: 08/04/2023 CLINICAL DATA:  Dyspnea EXAM: PORTABLE CHEST 1 VIEW COMPARISON:  Chest radiograph dated 07/10/2023 FINDINGS: Markedly low lung volumes. Bilateral diffuse interstitial and patchy opacities. Dense bibasilar opacities. Similar moderate bilateral pleural effusions. Similar enlarged cardiomediastinal silhouette. No acute osseous abnormality. IMPRESSION: 1. Markedly low lung volumes with bilateral diffuse interstitial and patchy opacities, likely pulmonary edema. 2. Similar moderate bilateral pleural effusions with dense bibasilar opacities, likely atelectasis. 3. Similar cardiomegaly. Electronically Signed   By: Agustin Cree M.D.   On: 08/04/2023 12:11      Cashis Rill T. Alfie Alderfer [facility]  If 7PM-7AM, please contact night-coverage [email or web address] 08/05/2023, 11:04 AM

## 2023-08-05 NOTE — Progress Notes (Signed)
TRH night cross cover note:   I was notified by RN that the patient is complaining of left lower extremity pain that is different than his chronic left knee discomfort.  He notes that his left lower extremity pain has been refractory to 10 mg of Percocet this evening and requests additional pain medication to further optimize this.  I subsequently placed order for Dilaudid 0.5 mg IV 2 hours as needed for pain refractory to prn Percocet.  He notes that his acute left lower extremity pain is at the level of the left tib-fib, and does not extend distally into the left ankle or left foot. The patient request evaluation of his left tib/fib pain film plain film assessment.   I subsequently ordered a portable plain film of the left tib-fib to further assess.    Newton Pigg, DO Hospitalist

## 2023-08-05 NOTE — Plan of Care (Signed)
  Problem: Health Behavior/Discharge Planning: Goal: Ability to manage health-related needs will improve Outcome: Not Progressing   Problem: Clinical Measurements: Goal: Will remain free from infection Outcome: Progressing   Problem: Clinical Measurements: Goal: Respiratory complications will improve Outcome: Progressing   Problem: Nutrition: Goal: Adequate nutrition will be maintained Outcome: Progressing

## 2023-08-06 ENCOUNTER — Inpatient Hospital Stay (HOSPITAL_COMMUNITY): Payer: Medicare Other

## 2023-08-06 DIAGNOSIS — J9602 Acute respiratory failure with hypercapnia: Secondary | ICD-10-CM | POA: Diagnosis not present

## 2023-08-06 DIAGNOSIS — J9601 Acute respiratory failure with hypoxia: Secondary | ICD-10-CM | POA: Diagnosis not present

## 2023-08-06 LAB — COMPREHENSIVE METABOLIC PANEL
ALT: 30 U/L (ref 0–44)
AST: 29 U/L (ref 15–41)
Albumin: 2 g/dL — ABNORMAL LOW (ref 3.5–5.0)
Alkaline Phosphatase: 245 U/L — ABNORMAL HIGH (ref 38–126)
Anion gap: 13 (ref 5–15)
BUN: 60 mg/dL — ABNORMAL HIGH (ref 6–20)
CO2: 29 mmol/L (ref 22–32)
Calcium: 9.3 mg/dL (ref 8.9–10.3)
Chloride: 97 mmol/L — ABNORMAL LOW (ref 98–111)
Creatinine, Ser: 8 mg/dL — ABNORMAL HIGH (ref 0.61–1.24)
GFR, Estimated: 9 mL/min — ABNORMAL LOW (ref >60)
Glucose, Bld: 130 mg/dL — ABNORMAL HIGH (ref 70–99)
Potassium: 4.9 mmol/L (ref 3.5–5.1)
Sodium: 139 mmol/L (ref 135–145)
Total Bilirubin: 1.1 mg/dL (ref <1.2)
Total Protein: 7.3 g/dL (ref 6.5–8.1)

## 2023-08-06 LAB — CBC
HCT: 29.4 % — ABNORMAL LOW (ref 39.0–52.0)
HCT: 31.3 % — ABNORMAL LOW (ref 39.0–52.0)
Hemoglobin: 8.9 g/dL — ABNORMAL LOW (ref 13.0–17.0)
Hemoglobin: 9.6 g/dL — ABNORMAL LOW (ref 13.0–17.0)
MCH: 27.1 pg (ref 26.0–34.0)
MCH: 27.6 pg (ref 26.0–34.0)
MCHC: 30.3 g/dL (ref 30.0–36.0)
MCHC: 30.7 g/dL (ref 30.0–36.0)
MCV: 89.6 fL (ref 80.0–100.0)
MCV: 89.9 fL (ref 80.0–100.0)
Platelets: 208 10*3/uL (ref 150–400)
Platelets: 247 10*3/uL (ref 150–400)
RBC: 3.28 MIL/uL — ABNORMAL LOW (ref 4.22–5.81)
RBC: 3.48 MIL/uL — ABNORMAL LOW (ref 4.22–5.81)
RDW: 16.4 % — ABNORMAL HIGH (ref 11.5–15.5)
RDW: 16.6 % — ABNORMAL HIGH (ref 11.5–15.5)
WBC: 4.8 10*3/uL (ref 4.0–10.5)
WBC: 5.1 10*3/uL (ref 4.0–10.5)
nRBC: 0 % (ref 0.0–0.2)
nRBC: 0 % (ref 0.0–0.2)

## 2023-08-06 LAB — RENAL FUNCTION PANEL
Albumin: 2.3 g/dL — ABNORMAL LOW (ref 3.5–5.0)
Anion gap: 14 (ref 5–15)
BUN: 60 mg/dL — ABNORMAL HIGH (ref 6–20)
CO2: 29 mmol/L (ref 22–32)
Calcium: 9.5 mg/dL (ref 8.9–10.3)
Chloride: 95 mmol/L — ABNORMAL LOW (ref 98–111)
Creatinine, Ser: 8.54 mg/dL — ABNORMAL HIGH (ref 0.61–1.24)
GFR, Estimated: 8 mL/min — ABNORMAL LOW (ref >60)
Glucose, Bld: 147 mg/dL — ABNORMAL HIGH (ref 70–99)
Phosphorus: 9.3 mg/dL — ABNORMAL HIGH (ref 2.5–4.6)
Potassium: 4.9 mmol/L (ref 3.5–5.1)
Sodium: 138 mmol/L (ref 135–145)

## 2023-08-06 LAB — MAGNESIUM: Magnesium: 2.1 mg/dL (ref 1.7–2.4)

## 2023-08-06 LAB — GLUCOSE, CAPILLARY
Glucose-Capillary: 153 mg/dL — ABNORMAL HIGH (ref 70–99)
Glucose-Capillary: 138 mg/dL — ABNORMAL HIGH (ref 70–99)
Glucose-Capillary: 111 mg/dL — ABNORMAL HIGH (ref 70–99)
Glucose-Capillary: 160 mg/dL — ABNORMAL HIGH (ref 70–99)

## 2023-08-06 LAB — PHOSPHORUS: Phosphorus: 8.7 mg/dL — ABNORMAL HIGH (ref 2.5–4.6)

## 2023-08-06 LAB — HEPATITIS B SURFACE ANTIBODY, QUANTITATIVE: Hep B S AB Quant (Post): 1547 m[IU]/mL

## 2023-08-06 MED ORDER — CHLORHEXIDINE GLUCONATE CLOTH 2 % EX PADS
6.0000 | MEDICATED_PAD | Freq: Every day | CUTANEOUS | Status: DC
Start: 2023-08-06 — End: 2023-08-19
  Administered 2023-08-06 – 2023-08-15 (×7): 6 via TOPICAL

## 2023-08-06 MED ORDER — HEPARIN SODIUM (PORCINE) 1000 UNIT/ML DIALYSIS
20.0000 [IU]/kg | INTRAMUSCULAR | Status: DC | PRN
  Administered 2023-08-06: 2900 [IU] via INTRAVENOUS_CENTRAL
  Filled 2023-08-06 (×2): qty 3

## 2023-08-06 NOTE — Progress Notes (Signed)
Dr. Arlean Hopping was made aware that pt c/o new left leg pain in the tib/fib area.pt's left leg is more swollen than the right leg. The pt requested an xray of his left leg. Xray was ordered.

## 2023-08-06 NOTE — Progress Notes (Signed)
Pt receives out-pt HD at [facility] on TTS. Will assist as needed.   Melven Sartorius Renal Navigator [phone]

## 2023-08-06 NOTE — Progress Notes (Addendum)
[facility] NEPHROLOGY PROGRESS NOTE  Assessment/ Plan: Pt is a 27 y.o. yo male   Dialysis Orders:  HP TTS 4:15 500/A2x EDW 112 kg 2K/2Ca AVF Heparin 62130 U Last HD 11/28 - post HD wt 146 kg  Mircera 200 q 2 weeks (last 11/19) Venofer 100 x 10  Hectorol 8 q TIW  #Profound volume overload - Chronic issue.  > 30 kg over EDW on presentation. Will need serial HD for volume removal. #ESRD -  HD TTS as OP via AVF. Serial HD as above. Plan for HD  today and tomorrow. #Hyperkalemia - has corrected with HD #Hypertension/volume  - BP acceptable. Massive volume overload as above #Anemia  - Hgb 8.9 on ESA- continue here. Continue IV Fe here if Bcx negative  #CKD-MBD/hyperphosphatemia-  Continue home binders-  velphoro. Follow Ca/Phos here  #Recurrent ascites - Defer to primary -  s/p paracentesis 12/2 of 3 liters  #Nutrition - Renal diet with fluid restriction  Subjective: Seen and examined at the bedside.  Lying on bed, denies nausea, vomiting, chest pain or shortness of breath.  Plan for HD today. Objective Vital signs in last 24 hours: Vitals:   08/05/23 2125 08/06/23 0025 08/06/23 0452 08/06/23 0747  BP: 135/85 136/77 (!) 108/56 137/82  Pulse:  79 93 80  Resp:  20 19 18   Temp:  (!) 97.3 F (36.3 C) (!) 97.5 F (36.4 C) (!) 97.4 F (36.3 C)  TempSrc:  Oral Oral Oral  SpO2:  99% 97% 100%  Weight:      Height:       Weight change:   Intake/Output Summary (Last 24 hours) at 08/06/2023 1028 Last data filed at 08/05/2023 1100 Gross per 24 hour  Intake 118 ml  Output --  Net 118 ml       Labs: RENAL PANEL Recent Labs  Lab 08/04/23 1135 08/04/23 1217 08/05/23 0235 08/06/23 0316  NA 142 137 139 139  K 5.9* >8.5* 4.4 4.9  CL 97*  --  96* 97*  CO2 32  --  31 29  GLUCOSE 132*  --  154* 130*  BUN 66*  --  48* 60*  CREATININE 9.55*  --  7.42* 8.00*  CALCIUM 9.6  --  9.4 9.3  MG  --   --   --  2.1  PHOS  --   --  7.6* 8.7*  ALBUMIN 2.3*  --  2.2* 2.0*     Liver Function Tests: Recent Labs  Lab 08/04/23 1135 08/05/23 0235 08/06/23 0316  AST 43*  --  29  ALT 35  --  30  ALKPHOS 256*  --  245*  BILITOT 1.5*  --  1.1  PROT 7.9  --  7.3  ALBUMIN 2.3* 2.2* 2.0*   No results for input(s): "LIPASE", "AMYLASE" in the last 168 hours. No results for input(s): "AMMONIA" in the last 168 hours. CBC: Recent Labs    04/03/23 0048 04/18/23 1700 04/24/23 0101 04/25/23 0147 08/04/23 1135 08/04/23 1217 08/06/23 0316  HGB 9.6*   < > 7.7* 7.3* 8.9* 10.2* 8.9*  MCV 84.4   < > 82.9 83.9 92.8  --  89.6  FERRITIN 941*  --   --   --   --   --   --   TIBC 132*  --   --   --   --   --   --   IRON 27*  --   --   --   --   --   --    < > =  values in this interval not displayed.    Cardiac Enzymes: Recent Labs  Lab 08/04/23 1135  CKTOTAL 253   CBG: Recent Labs  Lab 08/05/23 0612 08/05/23 1142 08/05/23 1606 08/05/23 2100 08/06/23 0648  GLUCAP 128* 151* 154* 147* 153*    Iron Studies: No results for input(s): "IRON", "TIBC", "TRANSFERRIN", "FERRITIN" in the last 72 hours. Studies/Results: US Paracentesis  Result Date: 08/05/2023 INDICATION: History of congestive heart failure and end-stage renal disease, now with recurrent symptomatic ascites. Request made for ultrasound-guided paracentesis for therapeutic purposes. EXAM: ULTRASOUND GUIDED PARACENTESIS MEDICATIONS: 10 cc 1% lidocaine. COMPLICATIONS: None immediate. PROCEDURE: Informed written consent was obtained from the patient after a discussion of the risks, benefits and alternatives to treatment. A timeout was performed prior to the initiation of the procedure. Initial ultrasound scanning demonstrates a large amount of ascites within the right lower abdominal quadrant. The right lower abdomen was prepped and draped in the usual sterile fashion. 1% lidocaine was used for local anesthesia. Following this, a 7 cm Yueh catheter was introduced. An ultrasound image was saved for documentation  purposes. The paracentesis was performed. The catheter was removed and a dressing was applied. The patient tolerated the procedure well without immediate post procedural complication. Patient received post-procedure intravenous albumin; see nursing notes for details. FINDINGS: A total of approximately 3 Liters of amber fluid was removed. IMPRESSION: Successful ultrasound-guided paracentesis yielding 3 liters of peritoneal fluid. Performed by Robet Leu [facility] Electronically Signed   By: Simonne Come M.D.   On: 08/05/2023 10:02   DG Chest Port 1 View  Result Date: 08/04/2023 CLINICAL DATA:  Dyspnea EXAM: PORTABLE CHEST 1 VIEW COMPARISON:  Chest radiograph dated 07/10/2023 FINDINGS: Markedly low lung volumes. Bilateral diffuse interstitial and patchy opacities. Dense bibasilar opacities. Similar moderate bilateral pleural effusions. Similar enlarged cardiomediastinal silhouette. No acute osseous abnormality. IMPRESSION: 1. Markedly low lung volumes with bilateral diffuse interstitial and patchy opacities, likely pulmonary edema. 2. Similar moderate bilateral pleural effusions with dense bibasilar opacities, likely atelectasis. 3. Similar cardiomegaly. Electronically Signed   By: Agustin Cree M.D.   On: 08/04/2023 12:11    Medications: Infusions:   Scheduled Medications:  carvedilol  25 mg Oral BID WC   Chlorhexidine Gluconate Cloth  6 each Topical Q0600   Chlorhexidine Gluconate Cloth  6 each Topical Q0600   darbepoetin (ARANESP) injection - DIALYSIS  200 mcg Subcutaneous Q Mon-1800   feeding supplement (NEPRO CARB STEADY)  237 mL Oral BID BM   gabapentin  100 mg Oral TID   heparin  5,000 Units Subcutaneous Q8H   hydrALAZINE  100 mg Oral TID   insulin aspart  0-15 Units Subcutaneous TID WC   insulin aspart  0-5 Units Subcutaneous QHS   isosorbide mononitrate  90 mg Oral Daily   loratadine  10 mg Oral Daily   sucroferric oxyhydroxide  1,000 mg Oral TID WC    have reviewed scheduled and prn  medications.  Physical Exam: General:NAD, comfortable Heart:RRR, s1s2 nl Lungs:clear b/l, no crackle Abdomen:soft, Non-tender, distended Extremities: Peripheral edema present/anasarca+ Dialysis Access: AVF with good thrill and bruit  Bobby [name] 08/06/2023,10:28 AM  LOS: 2 days

## 2023-08-06 NOTE — Plan of Care (Signed)
  Problem: Clinical Measurements: Goal: Will remain free from infection Outcome: Progressing   Problem: Clinical Measurements: Goal: Respiratory complications will improve Outcome: Progressing   Problem: Nutrition: Goal: Adequate nutrition will be maintained Outcome: Not Progressing

## 2023-08-06 NOTE — Progress Notes (Signed)
PROGRESS NOTE  Bobby [name]  ZOX:[id number] DOB: [birth year] DOA: 08/04/2023 PCP: [facility]   Brief Narrative:  Patient is a 27 year old male with history of ESRD on dialysis on TTS schedule, morbid obesity, systolic CHF, diabetes type 1, recurrent ascites requiring paracentesis, hypertension, anxiety who presented with myalgia, shortness of breath, abdominal distention, lower extremity edema.  He was found to be severely volume overloaded on presentation and on acute respiratory failure with hypoxia and hypercarbia secondary to pulmonary edema.  Lab work showed hyperkalemia.  Nephrology consulted and underlying OSA/OSH on dialysis.  Assessment & Plan:  Principal Problem:   Acute respiratory failure with hypoxia and hypercapnia (HCC) Active Problems:   Essential hypertension   Acute on chronic HFrEF (heart failure with reduced ejection fraction) (HCC)   Acute respiratory failure with hypoxia (HCC)   ESRD on dialysis (HCC)   Ascites   Cirrhosis (HCC)   Acute pulmonary edema (HCC)   Acute on chronic  hypoxic/hypercarbic respiratory failure: Secondary to volume overload causing pulmonary edema.  Underlying OSA/OHS could be contributing.    Volume management as per dialysis.  He is on 4 to 5 L of oxygen at home.  Currently on the same.  Denies any shortness of breath or cough.  Acute on chronic combined systolic-diastolic CHF/pulmonary edema: Echo on 7/24 showed EF of 30 to 35%, grade 1 diastolic dysfunction, severe left atrial, right atrial enlargement, moderate pleural effusion, moderate TR.  Repeat echo on 10/24 showed improved EF of 55 to 60%.  Volume management as per dialysis.  Appears volume overloaded  ESRD on dialysis/ hyperkalemia: Dialyzed on TTS schedule.  Nephrology following.  Potassium currently normal.  Has AV fistula on the left upper extremity  Recurrent ascites/liver cirrhosis: Unclear etiology of liver cirrhosis but could be from chronic  hepatic congestion from CHF/volume overload.  Needs paracentesis biweekly.  Underwent paracentesis with removal of 3 L of fluid on 12/1.  Today abdomen is soft and nondistended  Chronic pain syndrome/myalgia: Anasarca could have contributed.  Continue muscle relaxant, pain management  Type 1 diabetes: A1c of 6.2.  Continue current insulin regimen.  Hypertension: Continue current medications.  Monitor blood pressure.  Continue hydralazine, Coreg, Imdur  Anxiety disorder: On Atarax  Suspected underlying OSA/OHS: Patient says he had sleep study as an outpatient.  Waiting for delivery of the machine  Chronic normocytic anemia: Likely secondary to ESRD.  Currently hemoglobin stable.  Morbid obesity: BMI of 37.7  Generalized weakness: Lives with sister.  Uses rolling walker at baseline.  PT/OT consulted, recommend home health on discharge.          DVT prophylaxis:heparin injection 5,000 Units Start: 08/04/23 1515     Code Status: Full Code  Family Communication: None at the bedside  Patient status:Inpatient  Patient is from :home  Anticipated discharge WJ:XBJY  Estimated DC date: Tomorrow after dialysis   Consultants: Nephrology  Procedures:Dialysis  Antimicrobials:  Anti-infectives (From admission, onward)    None       Subjective:  Patient seen and examined at bedside today.  When I arrived, he was sleeping comfortably.  Lying in bed.  I opened the curtains.  He was comfortable.  Denied any worsening shortness of breath or cough.  He says he is on 4 to 5 L of oxygen at home.  Does not complain of any pain or discomfort this morning.. Feels ready  go home tomorrow  Objective: Vitals:   08/05/23 2125 08/06/23 0025 08/06/23 0452 08/06/23 0747  BP:  135/85 136/77 (!) 108/56 137/82  Pulse:  79 93 80  Resp:  20 19 18   Temp:  (!) 97.3 F (36.3 C) (!) 97.5 F (36.4 C) (!) 97.4 F (36.3 C)  TempSrc:  Oral Oral Oral  SpO2:  99% 97% 100%  Weight:      Height:         Intake/Output Summary (Last 24 hours) at 08/06/2023 0749 Last data filed at 08/05/2023 1100 Gross per 24 hour  Intake 118 ml  Output --  Net 118 ml   Filed Weights   08/04/23 1031 08/04/23 1345 08/04/23 1930  Weight: 119.3 kg 119.3 kg (!) 144.5 kg    Examination:   General exam: Overall comfortable, not in distress, morbidly obese HEENT: PERRL Respiratory system:  no wheezes, diminished air sounds on the bases, few crackles Cardiovascular system: S1 & S2 heard, RRR.  Gastrointestinal system: Abdomen is mildly distended, soft and nontender. Central nervous system: Alert and oriented Extremities: Bilateral lower extremity pitting, no clubbing ,no cyanosis, AV fistula on the left lower extremity Skin: No rashes, no ulcers,no icterus     Data Reviewed: I have personally reviewed following labs and imaging studies  CBC: Recent Labs  Lab 08/04/23 1135 08/04/23 1217 08/06/23 0316  WBC 5.4  --  4.8  HGB 8.9* 10.2* 8.9*  HCT 29.7* 30.0* 29.4*  MCV 92.8  --  89.6  PLT 231  --  208   Basic Metabolic Panel: Recent Labs  Lab 08/04/23 1135 08/04/23 1217 08/05/23 0235 08/06/23 0316  NA 142 137 139 139  K 5.9* >8.5* 4.4 4.9  CL 97*  --  96* 97*  CO2 32  --  31 29  GLUCOSE 132*  --  154* 130*  BUN 66*  --  48* 60*  CREATININE 9.55*  --  7.42* 8.00*  CALCIUM 9.6  --  9.4 9.3  MG  --   --   --  2.1  PHOS  --   --  7.6* 8.7*     Recent Results (from the past 240 hour(s))  Blood culture (routine x 2)     Status: None (Preliminary result)   Collection Time: 08/04/23 11:18 AM   Specimen: BLOOD  Result Value Ref Range Status   Specimen Description BLOOD SITE NOT SPECIFIED  Final   Special Requests   Final    BOTTLES DRAWN AEROBIC AND ANAEROBIC Blood Culture adequate volume   Culture   Final    NO GROWTH 2 DAYS Performed at [facility], [address], [place], [place] [address]    Report Status PENDING  Incomplete  Resp panel by RT-PCR (RSV, Flu A&B, Covid)  Anterior Nasal Swab     Status: None   Collection Time: 08/04/23 11:19 AM   Specimen: Anterior Nasal Swab  Result Value Ref Range Status   SARS Coronavirus 2 by RT PCR NEGATIVE NEGATIVE Final   Influenza A by PCR NEGATIVE NEGATIVE Final   Influenza B by PCR NEGATIVE NEGATIVE Final    Comment: (NOTE) The Xpert Xpress SARS-CoV-2/FLU/RSV plus assay is intended as an aid in the diagnosis of influenza from Nasopharyngeal swab specimens and should not be used as a sole basis for treatment. Nasal washings and aspirates are unacceptable for Xpert Xpress SARS-CoV-2/FLU/RSV testing.  Fact Sheet for Patients: BloggerCourse.com  Fact Sheet for Healthcare Providers: SeriousBroker.it  This test is not yet approved or cleared by the [place] FDA and has been authorized for detection and/or diagnosis of  SARS-CoV-2 by FDA under an Emergency Use Authorization (EUA). This EUA will remain in effect (meaning this test can be used) for the duration of the COVID-19 declaration under Section 564(b)(1) of the Act, 21 U.S.C. section 360bbb-3(b)(1), unless the authorization is terminated or revoked.     Resp Syncytial Virus by PCR NEGATIVE NEGATIVE Final    Comment: (NOTE) Fact Sheet for Patients: BloggerCourse.com  Fact Sheet for Healthcare Providers: SeriousBroker.it  This test is not yet approved or cleared by the [place] FDA and has been authorized for detection and/or diagnosis of SARS-CoV-2 by FDA under an Emergency Use Authorization (EUA). This EUA will remain in effect (meaning this test can be used) for the duration of the COVID-19 declaration under Section 564(b)(1) of the Act, 21 U.S.C. section 360bbb-3(b)(1), unless the authorization is terminated or revoked.  Performed at [facility], [address] [place], [place] [address]   Blood culture (routine x 2)     Status:  None (Preliminary result)   Collection Time: 08/05/23  2:36 AM   Specimen: BLOOD  Result Value Ref Range Status   Specimen Description BLOOD BLOOD RIGHT ARM  Final   Special Requests   Final    BOTTLES DRAWN AEROBIC AND ANAEROBIC Blood Culture adequate volume   Culture   Final    NO GROWTH 1 DAY Performed at [facility], [address] [place], [place] [address]    Report Status PENDING  Incomplete     Radiology Studies: US Paracentesis  Result Date: 08/05/2023 INDICATION: History of congestive heart failure and end-stage renal disease, now with recurrent symptomatic ascites. Request made for ultrasound-guided paracentesis for therapeutic purposes. EXAM: ULTRASOUND GUIDED PARACENTESIS MEDICATIONS: 10 cc 1% lidocaine. COMPLICATIONS: None immediate. PROCEDURE: Informed written consent was obtained from the patient after a discussion of the risks, benefits and alternatives to treatment. A timeout was performed prior to the initiation of the procedure. Initial ultrasound scanning demonstrates a large amount of ascites within the right lower abdominal quadrant. The right lower abdomen was prepped and draped in the usual sterile fashion. 1% lidocaine was used for local anesthesia. Following this, a 7 cm Yueh catheter was introduced. An ultrasound image was saved for documentation purposes. The paracentesis was performed. The catheter was removed and a dressing was applied. The patient tolerated the procedure well without immediate post procedural complication. Patient received post-procedure intravenous albumin; see nursing notes for details. FINDINGS: A total of approximately 3 Liters of amber fluid was removed. IMPRESSION: Successful ultrasound-guided paracentesis yielding 3 liters of peritoneal fluid. Performed by Robet Leu [facility] Electronically Signed   By: Simonne Come M.D.   On: 08/05/2023 10:02   DG Chest Port 1 View  Result Date: 08/04/2023 CLINICAL DATA:  Dyspnea EXAM: PORTABLE  CHEST 1 VIEW COMPARISON:  Chest radiograph dated 07/10/2023 FINDINGS: Markedly low lung volumes. Bilateral diffuse interstitial and patchy opacities. Dense bibasilar opacities. Similar moderate bilateral pleural effusions. Similar enlarged cardiomediastinal silhouette. No acute osseous abnormality. IMPRESSION: 1. Markedly low lung volumes with bilateral diffuse interstitial and patchy opacities, likely pulmonary edema. 2. Similar moderate bilateral pleural effusions with dense bibasilar opacities, likely atelectasis. 3. Similar cardiomegaly. Electronically Signed   By: Agustin Cree M.D.   On: 08/04/2023 12:11    Scheduled Meds:  carvedilol  25 mg Oral BID WC   Chlorhexidine Gluconate Cloth  6 each Topical Q0600   Chlorhexidine Gluconate Cloth  6 each Topical Q0600   darbepoetin (ARANESP) injection - DIALYSIS  200  mcg Subcutaneous Q Mon-1800   feeding supplement (NEPRO CARB STEADY)  237 mL Oral BID BM   gabapentin  100 mg Oral TID   heparin  5,000 Units Subcutaneous Q8H   hydrALAZINE  100 mg Oral TID   insulin aspart  0-15 Units Subcutaneous TID WC   insulin aspart  0-5 Units Subcutaneous QHS   isosorbide mononitrate  90 mg Oral Daily   loratadine  10 mg Oral Daily   sucroferric oxyhydroxide  1,000 mg Oral TID WC   Continuous Infusions:   LOS: 2 days   Burnadette Pop, MD [facility] P12/10/2022, 7:49 AM

## 2023-08-06 NOTE — Plan of Care (Signed)

## 2023-08-06 NOTE — Progress Notes (Signed)
Received patient in bed to unit.  Alert and oriented.  Informed consent signed and in chart.   TX duration:3:25  Patient tolerated well.  Transported back to the room  Alert, without acute distress.  Hand-off given to patient's nurse.   Access used: left AVF Access issues: NONE  Total UF removed: 4.4L Medication(s) given: ATARAX, DILAUDID, PERCOCET   08/06/23 1635  Vitals  Temp 98 F (36.7 C)  Temp Source Oral  BP 123/62  MAP (mmHg) 79  BP Location Right Arm  BP Method Automatic  Patient Position (if appropriate) Lying  Pulse Rate Source Monitor  ECG Heart Rate 85  Resp 16  Oxygen Therapy  SpO2 100 %  O2 Device Nasal Cannula  O2 Flow Rate (L/min) 2 L/min  During Treatment Monitoring  Blood Flow Rate (mL/min) 0 mL/min  Arterial Pressure (mmHg) 47.47 mmHg  Venous Pressure (mmHg) 41.01 mmHg  TMP (mmHg) 57.57 mmHg  Ultrafiltration Rate (mL/min) 1694 mL/min  Dialysate Flow Rate (mL/min) 299 ml/min  Dialysate Potassium Concentration 2  Dialysate Calcium Concentration 2.5  Duration of HD Treatment -hour(s) 3.41 hour(s)  Cumulative Fluid Removed (mL) per Treatment  4386.2  HD Safety Checks Performed Yes  Intra-Hemodialysis Comments  (tx terminated per pt request, Dr. Ronalee Belts aware machine wouldnt return blood blood wasnt returned.)      Jocee Kissick S [facility]

## 2023-08-07 DIAGNOSIS — J9601 Acute respiratory failure with hypoxia: Secondary | ICD-10-CM | POA: Diagnosis not present

## 2023-08-07 DIAGNOSIS — J9602 Acute respiratory failure with hypercapnia: Secondary | ICD-10-CM | POA: Diagnosis not present

## 2023-08-07 LAB — CBC
HCT: 29.2 % — ABNORMAL LOW (ref 39.0–52.0)
Hemoglobin: 8.7 g/dL — ABNORMAL LOW (ref 13.0–17.0)
MCH: 26.9 pg (ref 26.0–34.0)
MCHC: 29.8 g/dL — ABNORMAL LOW (ref 30.0–36.0)
MCV: 90.4 fL (ref 80.0–100.0)
Platelets: 239 10*3/uL (ref 150–400)
RBC: 3.23 MIL/uL — ABNORMAL LOW (ref 4.22–5.81)
RDW: 16.3 % — ABNORMAL HIGH (ref 11.5–15.5)
WBC: 4.7 10*3/uL (ref 4.0–10.5)
nRBC: 0 % (ref 0.0–0.2)

## 2023-08-07 LAB — RENAL FUNCTION PANEL
Albumin: 2.1 g/dL — ABNORMAL LOW (ref 3.5–5.0)
Anion gap: 11 (ref 5–15)
BUN: 54 mg/dL — ABNORMAL HIGH (ref 6–20)
CO2: 30 mmol/L (ref 22–32)
Calcium: 9.3 mg/dL (ref 8.9–10.3)
Chloride: 95 mmol/L — ABNORMAL LOW (ref 98–111)
Creatinine, Ser: 6.81 mg/dL — ABNORMAL HIGH (ref 0.61–1.24)
GFR, Estimated: 11 mL/min — ABNORMAL LOW (ref >60)
Glucose, Bld: 154 mg/dL — ABNORMAL HIGH (ref 70–99)
Phosphorus: 8.1 mg/dL — ABNORMAL HIGH (ref 2.5–4.6)
Potassium: 4.8 mmol/L (ref 3.5–5.1)
Sodium: 136 mmol/L (ref 135–145)

## 2023-08-07 LAB — GLUCOSE, CAPILLARY
Glucose-Capillary: 136 mg/dL — ABNORMAL HIGH (ref 70–99)
Glucose-Capillary: 152 mg/dL — ABNORMAL HIGH (ref 70–99)

## 2023-08-07 MED ORDER — TRAZODONE HCL 50 MG PO TABS
50.0000 mg | ORAL_TABLET | Freq: Every evening | ORAL | Status: DC | PRN
  Administered 2023-08-08 – 2023-08-27 (×17): 50 mg via ORAL
  Filled 2023-08-07 (×17): qty 1

## 2023-08-07 MED ORDER — LIDOCAINE-PRILOCAINE 2.5-2.5 % EX CREA: 1.0000 | TOPICAL_CREAM | CUTANEOUS | Status: DC | PRN

## 2023-08-07 MED ORDER — HEPARIN SODIUM (PORCINE) 1000 UNIT/ML DIALYSIS
20.0000 [IU]/kg | INTRAMUSCULAR | Status: DC | PRN
  Administered 2023-08-07: 2900 [IU] via INTRAVENOUS_CENTRAL
  Filled 2023-08-07: qty 3

## 2023-08-07 MED ORDER — OXYCODONE-ACETAMINOPHEN 5-325 MG PO TABS
1.0000 | ORAL_TABLET | Freq: Once | ORAL | Status: AC
  Administered 2023-08-08: 1 via ORAL
  Filled 2023-08-07: qty 1

## 2023-08-07 MED ORDER — HEPARIN SODIUM (PORCINE) 1000 UNIT/ML DIALYSIS: 1000.0000 [IU] | INTRAMUSCULAR | Status: DC | PRN

## 2023-08-07 MED ORDER — HYDROMORPHONE HCL 1 MG/ML IJ SOLN
0.5000 mg | INTRAMUSCULAR | Status: DC | PRN
  Administered 2023-08-07 – 2023-08-17 (×33): 0.5 mg via INTRAVENOUS
  Filled 2023-08-07 (×35): qty 0.5

## 2023-08-07 MED ORDER — PENTAFLUOROPROP-TETRAFLUOROETH EX AERO: 1.0000 | INHALATION_SPRAY | CUTANEOUS | Status: DC | PRN

## 2023-08-07 MED ORDER — ANTICOAGULANT SODIUM CITRATE 4% (200MG/5ML) IV SOLN: 5.0000 mL | Status: DC | PRN

## 2023-08-07 MED ORDER — ALTEPLASE 2 MG IJ SOLR: 2.0000 mg | Freq: Once | INTRAMUSCULAR | Status: DC | PRN

## 2023-08-07 MED ORDER — GABAPENTIN 100 MG PO CAPS
200.0000 mg | ORAL_CAPSULE | Freq: Three times a day (TID) | ORAL | Status: DC
  Administered 2023-08-07 – 2023-08-10 (×8): 200 mg via ORAL
  Filled 2023-08-07 (×8): qty 2

## 2023-08-07 MED ORDER — LIDOCAINE HCL (PF) 1 % IJ SOLN: 5.0000 mL | INTRAMUSCULAR | Status: DC | PRN

## 2023-08-07 NOTE — Progress Notes (Signed)
TRH night cross cover note:   The patient is requesting an additional Percocet 5/325 mg for breakthrough discomfort, which I have subsequently ordered.  Additionally, he is requesting a sleep aid, conveying that melatonin, historically, has been effective for him.  I subsequently added an order for as needed trazodone for insomnia.    Newton Pigg, DO Hospitalist

## 2023-08-07 NOTE — Progress Notes (Addendum)
Received patient in bed.Awake,alert and oriented x 4.Consent verified.  Access used:Left upper arm AVF that worked well.  Medicine given: Percocet 2 tablets.                           Heparin 2,900 units pre-run bolus.  Duration of treatment: 3 hours.  UF : Met 4 liters goal.  Tolerated treatment: Yes.  Hand off to the patient's nurse : Back into his room with stable medical condition via transporter.

## 2023-08-07 NOTE — TOC Progression Note (Addendum)
Transition of Care [facility]) - Progression Note    Patient Details  Name: Bobby [name] MRN: [id number] Date of Birth: [birth year]  Transition of Care [facility]) CM/SW Contact  Leone Haven, RN Phone Number: 08/07/2023, 10:25 AM  Clinical Narrative:    He is set up with OP PT per previous NCM note,  also patient states he does not have oxygen at home, he will need to be set up with Home oxygen. NCM notified MD of this information.  He states he lives with his sister and she will transport him home when he goes.   NCM received call from Case Manager,  Annice Pih,  [phone] at [facility] stating patient's sister works and he will not have 24/7 care at home he will likely need to go back to a SNF. In the last 2 months she states he has gotten worse, the home is not w/chair accessible, if goes home he will need hospi bed, bsc and PCS services expedited prior to dc.  NCM spoke with patient again regarding this information, he states yes his sister works and he is willing to go to a SNF but does not want to go back to the one he was at previously which is [facility].  PT to re-eval.        Expected Discharge Plan and Services                                               Social Determinants of Health (SDOH) Interventions SDOH Screenings   Food Insecurity: No Food Insecurity (08/05/2023)  Recent Concern: Food Insecurity - High Risk (05/29/2023)   Received from [facility]  Housing: Low Risk  (08/05/2023)  Transportation Needs: No Transportation Needs (08/05/2023)  Recent Concern: Transportation Needs - Unmet Transportation Needs (07/10/2023)   Received from [facility]  Utilities: Not At Risk (08/05/2023)  Recent Concern: Utilities - Medium Risk (05/29/2023)   Received from [facility]  Financial Resource Strain: Low Risk  (07/20/2022)   [facility] from [facility] visits prior to 11/04/2022., [facility], [facility] visits prior  to 11/04/2022., [facility]  Physical Activity: Insufficiently Active (07/20/2022)   Received from [facility], [facility] visits prior to 11/04/2022., [facility] visits prior to 11/04/2022., [facility]  Social Connections: Unknown (07/20/2022)   [facility] visits prior to 11/04/2022., [facility] visits prior to 11/04/2022.  Stress: No Stress Concern Present (07/20/2022)   [facility] from [facility], [facility] visits prior to 11/04/2022., [facility] visits prior to 11/04/2022., [facility]  Tobacco Use: Medium Risk (08/04/2023)    Readmission Risk Interventions    04/19/2023    3:26 PM 03/11/2021    3:57 PM  Readmission Risk Prevention Plan  Transportation Screening Complete Complete  PCP or Specialist Appt within 5-7 Days Complete Not Complete  Not Complete comments  03/29/21 is first available appt  Home Care Screening Complete Complete  Medication Review (RN CM) Referral to Pharmacy

## 2023-08-07 NOTE — Plan of Care (Signed)

## 2023-08-07 NOTE — Progress Notes (Signed)
The patient refused his Percocet 2 tablets PRN  and Flexeril PRN.  Stated they don't work. Mathew RN notified Dr. Arlean Hopping about his willing to get strong pain medication. No new order received.

## 2023-08-07 NOTE — Evaluation (Signed)
Physical Therapy RE-Evaluation Patient Details Name: Bobby [name] MRN: [id number] DOB: [birth year] Today's Date: 08/07/2023  History of Present Illness  Pt is 27 yo presenting to [facility] ED with diffuse muscle aches after missing HD session on Tuesday. Also reporting dyspnea on exertion, worsening abdominal swelling and leg swelling. Pt was able to get HD on Thursday with 6L removed. PMH includes recurrent ascites requiring paracentesis, ESRD (HD TTS), HFrEF, type 1 diabetes, HTN, obesity.  Clinical Impression  Patient participating this session with EOB activity with encouragement from sister and RN.  Seems frustrated by limited mobility with massive LE edema and pain esp in L knee.  He reports working with therapy previously while at inpatient rehab, though frustrated by progress.  Sister would love to bring him home, but needs more resources so discussed PACE versus PCS services and RN CM aware and planning to contact MD.  Currently pt would need inpatient rehab (<3 hours/day) and hopeful for someplace other than [facility].  PT will continue to follow in the acute setting.         If plan is discharge home, recommend the following: Two people to help with walking and/or transfers;A little help with bathing/dressing/bathroom;Help with stairs or ramp for entrance   Can travel by private vehicle   No    Equipment Recommendations None recommended by PT  Recommendations for Other Services       Functional Status Assessment Patient has had a recent decline in their functional status and demonstrates the ability to make significant improvements in function in a reasonable and predictable amount of time.     Precautions / Restrictions Precautions Precautions: Fall Precaution Comments: LE weakness, L knee pain, massive LE edema Restrictions Weight Bearing Restrictions: No      Mobility  Bed Mobility Overal bed mobility: Needs Assistance Bed Mobility: Supine to Sit, Sit to Supine,  Rolling Rolling: Min assist   Supine to sit: Min assist, Used rails Sit to supine: Mod assist   General bed mobility comments: up to EOB assist for L LE only, to supine assist for both legs; rolled with min A for changing bed pad    Transfers Overall transfer level: Needs assistance   Transfers: Sit to/from Stand, Bed to chair/wheelchair/BSC Sit to Stand: Min assist, From elevated surface          Lateral/Scoot Transfers: Mod assist General transfer comment: several attempts to stand to RW From highly elevated bed with mod A but pt unable, scooting to [facility] using bed pad and bed in trendelenberg though pt fatigued with scooting hips    Ambulation/Gait                  Stairs            Wheelchair Mobility     Tilt Bed    Modified Rankin (Stroke Patients Only)       Balance Overall balance assessment: Needs assistance   Sitting balance-Leahy Scale: Good                                       Pertinent Vitals/Pain Pain Assessment Pain Assessment: Faces Faces Pain Scale: Hurts even more Pain Location: L knee and bilat LEs Pain Descriptors / Indicators: Tightness, Aching Pain Intervention(s): Monitored during session, Limited activity within patient's tolerance, Premedicated before session    Home Living Family/patient expects to be discharged to:: Private residence Living Arrangements: Other  relatives (sister and 21 y/o nephew) Available Help at Discharge: Family;Available PRN/intermittently Type of Home: House Home Access: Stairs to enter Entrance Stairs-Rails: None Entrance Stairs-Number of Steps: 1   Home Layout: One level Home Equipment: Tub bench;Shower seat;BSC/3in1;Wheelchair - manual;Other (comment);Rollator (4 wheels) Additional Comments: O2 at home, wheelchair has not been opened up yet    Prior Function Prior Level of Function : Needs assist             Mobility Comments: reports at rehab prior to hospitalization  and had been working in therapy, walking very limited with L knee pain       Extremity/Trunk Assessment   Upper Extremity Assessment Upper Extremity Assessment: RUE deficits/detail;LUE deficits/detail RUE Deficits / Details: AROM WFL strength 4 to 4+/5 LUE Deficits / Details: AROM WFL strength 4 to 4+/5    Lower Extremity Assessment Lower Extremity Assessment: RLE deficits/detail;LLE deficits/detail RLE Deficits / Details: AAROM limited due to swelling, able to lift from hip only with help (2/5) knee extension 2+/5, ankle DF 3+/5 RLE Sensation: decreased light touch LLE Deficits / Details: AAROM limited due to swelling and significant knee pain not wanting to extend fully and keeping rotated and flexed, strength NT due to pain LLE Sensation: decreased light touch       Communication   Communication Communication: No apparent difficulties  Cognition Arousal: Alert Behavior During Therapy: WFL for tasks assessed/performed Overall Cognitive Status: Within Functional Limits for tasks assessed                                 General Comments: initially asking to do it later, then RN in to encourage to work now that had pain meds and working as much as it can        Chiropodist comments (skin integrity, edema, etc.): Sister in the room and supportive and hopes he can get back home,  knows he needs rehab though wanting to avoid going back to [facility].  Spoke with RN CM about PCS application versus PACE application.    Exercises     Assessment/Plan    PT Assessment Patient needs continued PT services  PT Problem List Decreased strength;Decreased activity tolerance;Decreased balance;Decreased mobility;Decreased knowledge of precautions;Pain;Decreased knowledge of use of DME       PT Treatment Interventions DME instruction;Gait training;Patient/family education;Functional mobility training;Wheelchair mobility training;Therapeutic activities;Therapeutic  exercise;Balance training    PT Goals (Current goals can be found in the Care Plan section)  Acute Rehab PT Goals Patient Stated Goal: Hopeful to get home somehow, but needs rehab now PT Goal Formulation: With patient/family Time For Goal Achievement: 08/21/23 Potential to Achieve Goals: Fair    Frequency Min 1X/week     Co-evaluation               AM-PAC PT "6 Clicks" Mobility  Outcome Measure Help needed turning from your back to your side while in a flat bed without using bedrails?: A Little Help needed moving from lying on your back to sitting on the side of a flat bed without using bedrails?: A Lot Help needed moving to and from a bed to a chair (including a wheelchair)?: Total Help needed standing up from a chair using your arms (e.g., wheelchair or bedside chair)?: Total Help needed to walk in hospital room?: Total Help needed climbing 3-5 steps with a railing? : Total 6 Click Score: 9    End of Session Equipment  Utilized During Treatment: Gait belt Activity Tolerance: Patient limited by pain Patient left: in bed;with call bell/phone within reach   PT Visit Diagnosis: Muscle weakness (generalized) (M62.81);Other abnormalities of gait and mobility (R26.89)    Time: 6213-0865 PT Time Calculation (min) (ACUTE ONLY): 31 min   Charges:   PT Evaluation $PT Re-evaluation: 1 Re-eval PT Treatments $Therapeutic Activity: 8-22 mins PT General Charges $$ ACUTE PT VISIT: 1 Visit         Sheran Lawless, PT Acute Rehabilitation Services [phone] 08/07/2023   Elray Mcgregor 08/07/2023, 3:00 PM

## 2023-08-07 NOTE — Progress Notes (Signed)
PT Cancellation Note  Patient Details Name: Bobby [name] MRN: [id number] DOB: August 11, 1996   Cancelled Treatment:    Reason Eval/Treat Not Completed: Patient at procedure or test/unavailable  Noted new order for PT to see prior to discharge home. Patient currently in dialysis (just left the floor apparently).   Please see PT evaluation 08/05/23 where pt refused OOB mobility and requested to be discharged from acute PT. He agreed to [facility] therapies on return home.    Per MD, pt no longer has support at home and requested PT re-evaluate. Will attempt to see after dialysis.    Jerolyn Center, PT Acute Rehabilitation Services  Office [phone]   Zena Amos 08/07/2023, 10:56 AM

## 2023-08-07 NOTE — Progress Notes (Signed)
PROGRESS NOTE  Bobby [name]  FIE:[id number] DOB: Apr 08, 1996 DOA: 08/04/2023 PCP: [facility]   Brief Narrative:  Patient is a 27 year old male with history of ESRD on dialysis on TTS schedule, morbid obesity, systolic CHF, diabetes type 1, recurrent ascites requiring paracentesis, hypertension, anxiety who presented with myalgia, shortness of breath, abdominal distention, lower extremity edema.  He was found to be severely volume overloaded on presentation and on acute respiratory failure with hypoxia and hypercarbia secondary to pulmonary edema.  Lab work showed hyperkalemia.  Nephrology consulted  for serial dialysis.  Patient is from SNF.  PT/OT consulted   Assessment & Plan:  Principal Problem:   Acute respiratory failure with hypoxia and hypercapnia (HCC) Active Problems:   Essential hypertension   Acute on chronic HFrEF (heart failure with reduced ejection fraction) (HCC)   Acute respiratory failure with hypoxia (HCC)   ESRD on dialysis (HCC)   Ascites   Cirrhosis (HCC)   Acute pulmonary edema (HCC)   Acute on chronic  hypoxic/hypercarbic respiratory failure: Secondary to volume overload causing pulmonary edema.  Underlying OSA/OHS could be contributing.    Volume management as per dialysis. Denies any shortness of breath or cough.  As per the report, patient was on oxygen at 4 to 5 L at a SNF   Acute on chronic combined systolic-diastolic CHF/pulmonary edema: Echo on 7/24 showed EF of 30 to 35%, grade 1 diastolic dysfunction, severe left atrial, right atrial enlargement, moderate pleural effusion, moderate TR.  Repeat echo on 10/24 showed improved EF of 55 to 60%.  Volume management as per dialysis.  Appears volume overloaded  ESRD on dialysis/ hyperkalemia: Dialyzed on TTS schedule.  Nephrology following.  Potassium currently normal.  Has AV fistula on the left upper extremity  Recurrent ascites/liver cirrhosis: Unclear etiology of liver cirrhosis but  could be from chronic hepatic congestion from CHF/volume overload.  Needs paracentesis biweekly.  Underwent paracentesis with removal of 3 L of fluid on 12/1.  Abdomen remains soft   Chronic pain syndrome/myalgia/lower extremity pain: Anasarca could have contributed.  Continue muscle relaxant, pain management  Type 1 diabetes: A1c of 6.2.  Continue current insulin regimen.  Hypertension: Continue current medications.  Monitor blood pressure.  Continue hydralazine, Coreg, Imdur  Anxiety disorder: On Atarax  Suspected underlying OSA/OHS: Patient says he had sleep study as an outpatient.  Waiting for delivery of the machine  Chronic normocytic anemia: Likely secondary to ESRD.  Currently hemoglobin stable.  Morbid obesity: BMI of 37.7  Generalized weakness: He was from SNF.  Patient has a sister.  Has poor support at home.  Uses rolling walker at baseline.  PT/OT consulted, recommend home health on discharge.  But again with his deconditioning, he may need SNF on discharge.  PT/OT consulted again          DVT prophylaxis:heparin injection 5,000 Units Start: 08/04/23 1515     Code Status: Full Code  Family Communication: None at the bedside  Patient status:Inpatient  Patient is from :home  Anticipated discharge ZY:SAYT vs SnF  Estimated DC date: 1-2 days.  May need further dialysis as  inpatient   Consultants: Nephrology  Procedures:Dialysis  Antimicrobials:  Anti-infectives (From admission, onward)    None       Subjective:  Patient seen and examined at bedside today at dialysis suite.  He appeared comfortable.  Still appears significantly volume overloaded with severe bilateral lower extremity pitting edema.  Denies any worsening shortness of breath or cough.  Objective:  Vitals:   08/07/23 0930 08/07/23 1000 08/07/23 1030 08/07/23 1104  BP: 136/85 138/65 (!) 145/63 123/68  Pulse: 87 88 90 89  Resp: 15 (!) 24 17 15   Temp:    97.7 F (36.5 C)  TempSrc:       SpO2: 99% 99%  99%  Weight:    (!) 140.4 kg  Height:        Intake/Output Summary (Last 24 hours) at 08/07/2023 1134 Last data filed at 08/07/2023 1104 Gross per 24 hour  Intake 946 ml  Output 8400 ml  Net -7454 ml   Filed Weights   08/07/23 0602 08/07/23 0725 08/07/23 1104  Weight: (!) 150.5 kg (!) 143.4 kg (!) 140.4 kg    Examination:  General exam: Overall comfortable, not in distress, morbidly obese, deconditioned HEENT: PERRL Respiratory system:   diminished sounds on bases, basal crackles Cardiovascular system: S1 & S2 heard, RRR.  Gastrointestinal system: Abdomen is nondistended, soft and nontender. Central nervous system: Alert and oriented Extremities: Severe bilateral lower extremity pitting edema, no clubbing ,no cyanosis, AV fistula on the left upper extremity Skin: No rashes, no ulcers,no icterus     Data Reviewed: I have personally reviewed following labs and imaging studies  CBC: Recent Labs  Lab 08/04/23 1135 08/04/23 1217 08/06/23 0316 08/06/23 1015 08/07/23 0739  WBC 5.4  --  4.8 5.1 4.7  HGB 8.9* 10.2* 8.9* 9.6* 8.7*  HCT 29.7* 30.0* 29.4* 31.3* 29.2*  MCV 92.8  --  89.6 89.9 90.4  PLT 231  --  208 247 239   Basic Metabolic Panel: Recent Labs  Lab 08/04/23 1135 08/04/23 1217 08/05/23 0235 08/06/23 0316 08/06/23 1015 08/07/23 0739  NA 142 137 139 139 138 136  K 5.9* >8.5* 4.4 4.9 4.9 4.8  CL 97*  --  96* 97* 95* 95*  CO2 32  --  31 29 29 30   GLUCOSE 132*  --  154* 130* 147* 154*  BUN 66*  --  48* 60* 60* 54*  CREATININE 9.55*  --  7.42* 8.00* 8.54* 6.81*  CALCIUM 9.6  --  9.4 9.3 9.5 9.3  MG  --   --   --  2.1  --   --   PHOS  --   --  7.6* 8.7* 9.3* 8.1*     Recent Results (from the past 240 hour(s))  Blood culture (routine x 2)     Status: None (Preliminary result)   Collection Time: 08/04/23 11:18 AM   Specimen: BLOOD  Result Value Ref Range Status   Specimen Description BLOOD SITE NOT SPECIFIED  Final   Special Requests    Final    BOTTLES DRAWN AEROBIC AND ANAEROBIC Blood Culture adequate volume   Culture   Final    NO GROWTH 3 DAYS Performed at [facility], [address], [place], [place] [address]    Report Status PENDING  Incomplete  Resp panel by RT-PCR (RSV, Flu A&B, Covid) Anterior Nasal Swab     Status: None   Collection Time: 08/04/23 11:19 AM   Specimen: Anterior Nasal Swab  Result Value Ref Range Status   SARS Coronavirus 2 by RT PCR NEGATIVE NEGATIVE Final   Influenza A by PCR NEGATIVE NEGATIVE Final   Influenza B by PCR NEGATIVE NEGATIVE Final    Comment: (NOTE) The Xpert Xpress SARS-CoV-2/FLU/RSV plus assay is intended as an aid in the diagnosis of influenza from Nasopharyngeal swab specimens and should not be used as a sole  basis for treatment. Nasal washings and aspirates are unacceptable for Xpert Xpress SARS-CoV-2/FLU/RSV testing.  Fact Sheet for Patients: BloggerCourse.com  Fact Sheet for Healthcare Providers: SeriousBroker.it  This test is not yet approved or cleared by the [place] FDA and has been authorized for detection and/or diagnosis of SARS-CoV-2 by FDA under an Emergency Use Authorization (EUA). This EUA will remain in effect (meaning this test can be used) for the duration of the COVID-19 declaration under Section 564(b)(1) of the Act, 21 U.S.C. section 360bbb-3(b)(1), unless the authorization is terminated or revoked.     Resp Syncytial Virus by PCR NEGATIVE NEGATIVE Final    Comment: (NOTE) Fact Sheet for Patients: BloggerCourse.com  Fact Sheet for Healthcare Providers: SeriousBroker.it  This test is not yet approved or cleared by the [place] FDA and has been authorized for detection and/or diagnosis of SARS-CoV-2 by FDA under an Emergency Use Authorization (EUA). This EUA will remain in effect (meaning this test can be used) for the duration  of the COVID-19 declaration under Section 564(b)(1) of the Act, 21 U.S.C. section 360bbb-3(b)(1), unless the authorization is terminated or revoked.  Performed at [facility], [address], [place], [place] [address]   Blood culture (routine x 2)     Status: None (Preliminary result)   Collection Time: 08/05/23  2:36 AM   Specimen: BLOOD  Result Value Ref Range Status   Specimen Description BLOOD BLOOD RIGHT ARM  Final   Special Requests   Final    BOTTLES DRAWN AEROBIC AND ANAEROBIC Blood Culture adequate volume   Culture   Final    NO GROWTH 2 DAYS Performed at [facility], [address] [place], [place] [address]    Report Status PENDING  Incomplete     Radiology Studies: DG Tibia/Fibula Left Port  Result Date: 08/06/2023 CLINICAL DATA:  Left leg pain. EXAM: PORTABLE LEFT TIBIA AND FIBULA - 2 VIEW COMPARISON:  Left knee radiographs 06/13/2023 FINDINGS: No acute fracture or destructive bone lesion is identified. The knee and ankle are located. Diffuse soft tissue calcification is again seen and may be secondary to chronic venous stasis. Atherosclerotic vascular calcifications are also noted. IMPRESSION: No acute osseous abnormality. Electronically Signed   By: Sebastian Ache M.D.   On: 08/06/2023 12:02    Scheduled Meds:  carvedilol  25 mg Oral BID WC   Chlorhexidine Gluconate Cloth  6 each Topical Q0600   darbepoetin (ARANESP) injection - DIALYSIS  200 mcg Subcutaneous Q Mon-1800   feeding supplement (NEPRO CARB STEADY)  237 mL Oral BID BM   gabapentin  100 mg Oral TID   heparin  5,000 Units Subcutaneous Q8H   hydrALAZINE  100 mg Oral TID   insulin aspart  0-15 Units Subcutaneous TID WC   insulin aspart  0-5 Units Subcutaneous QHS   isosorbide mononitrate  90 mg Oral Daily   loratadine  10 mg Oral Daily   sucroferric oxyhydroxide  1,000 mg Oral TID WC   Continuous Infusions:   LOS: 3 days   Burnadette Pop, MD [facility] P12/11/2022, 11:34 AM

## 2023-08-07 NOTE — Procedures (Signed)
Patient was seen on dialysis and the procedure was supervised.  BFR 400  Via AVF BP is  136/85.   Patient appears to be tolerating treatment well. Next HD on Thursday.  Bobby [name] Bobby [name] 08/07/2023

## 2023-08-07 NOTE — Progress Notes (Signed)
The patient  refused the PRN Percocet and Flexeril PRN. Stated he wants 3 percocet instead of 2 ( Stated the 2 do not work). He's also requesting for stronger sleeping aid other than Melatonin. Notified Dr. Arlean Hopping and received one additional dose of  Percocet  once and Trazodone 50 mg a PRN at bedtime.

## 2023-08-08 ENCOUNTER — Inpatient Hospital Stay (HOSPITAL_COMMUNITY): Payer: Medicare Other

## 2023-08-08 DIAGNOSIS — J9602 Acute respiratory failure with hypercapnia: Secondary | ICD-10-CM | POA: Diagnosis not present

## 2023-08-08 DIAGNOSIS — J9601 Acute respiratory failure with hypoxia: Secondary | ICD-10-CM | POA: Diagnosis not present

## 2023-08-08 LAB — GLUCOSE, CAPILLARY
Glucose-Capillary: 144 mg/dL — ABNORMAL HIGH (ref 70–99)
Glucose-Capillary: 109 mg/dL — ABNORMAL HIGH (ref 70–99)
Glucose-Capillary: 135 mg/dL — ABNORMAL HIGH (ref 70–99)
Glucose-Capillary: 181 mg/dL — ABNORMAL HIGH (ref 70–99)

## 2023-08-08 MED ORDER — CHLORHEXIDINE GLUCONATE CLOTH 2 % EX PADS: 6.0000 | MEDICATED_PAD | Freq: Every day | CUTANEOUS | Status: DC

## 2023-08-08 MED ORDER — OXYCODONE-ACETAMINOPHEN 5-325 MG PO TABS
2.0000 | ORAL_TABLET | Freq: Four times a day (QID) | ORAL | Status: DC | PRN
  Administered 2023-08-09 – 2023-08-12 (×8): 3 via ORAL
  Administered 2023-08-12 – 2023-08-13 (×3): 2 via ORAL
  Administered 2023-08-13 – 2023-08-14 (×5): 3 via ORAL
  Administered 2023-08-15: 2 via ORAL
  Administered 2023-08-15 – 2023-08-17 (×4): 3 via ORAL
  Filled 2023-08-08 (×4): qty 3
  Filled 2023-08-08: qty 2
  Filled 2023-08-08 (×4): qty 3
  Filled 2023-08-08: qty 2
  Filled 2023-08-08: qty 3
  Filled 2023-08-08: qty 2
  Filled 2023-08-08 (×4): qty 3
  Filled 2023-08-08: qty 2
  Filled 2023-08-08 (×5): qty 3

## 2023-08-08 MED ORDER — SUCROFERRIC OXYHYDROXIDE 500 MG PO CHEW
1000.0000 mg | CHEWABLE_TABLET | ORAL | Status: DC
  Administered 2023-08-15: 1000 mg via ORAL
  Filled 2023-08-08: qty 2

## 2023-08-08 NOTE — Plan of Care (Signed)
  Problem: Education: Goal: Knowledge of General Education information will improve Description: Including pain rating scale, medication(s)/side effects and non-pharmacologic comfort measures Outcome: Progressing   Problem: Clinical Measurements: Goal: Ability to maintain clinical measurements within normal limits will improve Outcome: Progressing Goal: Will remain free from infection Outcome: Progressing Goal: Cardiovascular complication will be avoided Outcome: Progressing   Problem: Nutrition: Goal: Adequate nutrition will be maintained Outcome: Progressing   Problem: Elimination: Goal: Will not experience complications related to bowel motility Outcome: Progressing   Problem: Clinical Measurements: Goal: Respiratory complications will improve Outcome: Not Progressing   Problem: Activity: Goal: Risk for activity intolerance will decrease Outcome: Not Progressing   Problem: Coping: Goal: Level of anxiety will decrease Outcome: Not Progressing   Problem: Pain Management: Goal: General experience of comfort will improve Outcome: Not Progressing   Problem: Coping: Goal: Ability to adjust to condition or change in health will improve Outcome: Not Progressing   Problem: Health Behavior/Discharge Planning: Goal: Ability to manage health-related needs will improve Outcome: Not Progressing

## 2023-08-08 NOTE — Progress Notes (Signed)
PROGRESS NOTE  Bobby [name]  WUJ:[id number] DOB: 01-18-1996 DOA: 08/04/2023 PCP: [facility]   Brief Narrative: Patient is a 27 year old male with history of ESRD on dialysis on TTS schedule, morbid obesity, systolic CHF, diabetes type 1, recurrent ascites requiring paracentesis, hypertension, anxiety who presented with myalgia, shortness of breath, abdominal distention, lower extremity edema.  He was found to be severely volume overloaded on presentation and on acute respiratory failure with hypoxia and hypercarbia secondary to pulmonary edema.  Lab work showed hyperkalemia.  Nephrology consulted  for serial dialysis.  Patient is from SNF.  PT/OT consulted , recommendation is to go back to SNF.  Assessment & Plan:  Principal Problem:   Acute respiratory failure with hypoxia and hypercapnia (HCC) Active Problems:   Essential hypertension   Acute on chronic HFrEF (heart failure with reduced ejection fraction) (HCC)   Acute respiratory failure with hypoxia (HCC)   ESRD on dialysis (HCC)   Ascites   Cirrhosis (HCC)   Acute pulmonary edema (HCC)   Acute on chronic  hypoxic/hypercarbic respiratory failure: Secondary to volume overload causing pulmonary edema.  Underlying OSA/OHS could be contributing.    Volume management as per dialysis. Denies any shortness of breath or cough.  As per the report, patient was on oxygen at 4 to 5 L at a SNF   Acute on chronic combined systolic-diastolic CHF/pulmonary edema: Echo on 7/24 showed EF of 30 to 35%, grade 1 diastolic dysfunction, severe left atrial, right atrial enlargement, moderate pleural effusion, moderate TR.  Repeat echo on 10/24 showed improved EF of 55 to 60%.  Volume management as per dialysis.  Appears volume overloaded  ESRD on dialysis/ hyperkalemia: Dialyzed on TTS schedule.  Nephrology following.  Potassium currently normal.  Has AV fistula on the left upper extremity  Recurrent ascites/liver cirrhosis:  Unclear etiology of liver cirrhosis but could be from chronic hepatic congestion from CHF/volume overload.  Needs paracentesis biweekly.  Underwent paracentesis with removal of 3 L of fluid on 12/1.  Abdomen remains soft   Chronic pain syndrome/myalgia/lower extremity pain: Anasarca could have contributed.  Continue muscle relaxant, pain management  Type 1 diabetes: A1c of 6.2.  Continue current insulin regimen.  Hypertension: Continue current medications.  Monitor blood pressure.  Continue hydralazine, Coreg, Imdur  Anxiety disorder: On Atarax as needed  Suspected underlying OSA/OHS: Patient says he had sleep study as an outpatient.  Waiting for delivery of the machine  Chronic normocytic anemia: Likely secondary to ESRD.  Currently hemoglobin stable.  Morbid obesity: BMI of 37.7  Generalized weakness/inability to ambulate: He was from SNF.  Patient has a sister.  He used rolling walker at baseline. But recently he is minimally ambulatory.   PT/OT consulted a, recommendation is SNF.  As per the sister, the last time he worked fine was in July after that he gradually decline.  No history of trauma.  He just says that he cannot lift his legs.  Will check x-ray of the pelvis, bilateral knees x-ray        DVT prophylaxis:heparin injection 5,000 Units Start: 08/04/23 1515     Code Status: Full Code  Family Communication: Called sister Ms. Freeland  twice on phone, call not received  Patient status:Inpatient  Patient is from :home  Anticipated discharge to:SnF  Estimated DC date:  May need further dialysis as  inpatient, waiting for bed availability   Consultants: Nephrology  Procedures:Dialysis  Antimicrobials:  Anti-infectives (From admission, onward)    None  Subjective:  Patient seen and examined at bedside today.  Hemodynamically stable.  Lying on bed.  Overall comfortable.  On baseline oxygen requirement.  Denies any shortness of breath or cough.  Denies any  significant pain in bilateral lower extremities but he says he cannot lift them.  He says he has not walked for last several months  Objective: Vitals:   08/08/23 0041 08/08/23 0044 08/08/23 0503 08/08/23 0752  BP:  (!) 147/73 (!) 148/73 (!) 148/84  Pulse: 89 89 94 97  Resp: 17 17 17  (!) 21  Temp: 97.6 F (36.4 C)  99.5 F (37.5 C) 100.3 F (37.9 C)  TempSrc: Oral  Oral Oral  SpO2:  100% 96% 97%  Weight:   (!) 143.2 kg   Height:        Intake/Output Summary (Last 24 hours) at 08/08/2023 1131 Last data filed at 08/08/2023 0900 Gross per 24 hour  Intake 0 ml  Output --  Net 0 ml   Filed Weights   08/07/23 0725 08/07/23 1104 08/08/23 0503  Weight: (!) 143.4 kg (!) 140.4 kg (!) 143.2 kg    Examination:  General exam: Overall comfortable, not in distress, morbidly obese, deconditioned HEENT: PERRL Respiratory system: Diminished sounds on bases Cardiovascular system: S1 & S2 heard, RRR.  Gastrointestinal system: Abdomen is nondistended, soft and nontender. Central nervous system: Alert and oriented Extremities: severe bilateral lower extremity edema, no clubbing ,no cyanosis, AV fistula on the left lower extremity Skin: No rashes, no ulcers,no icterus     Data Reviewed: I have personally reviewed following labs and imaging studies  CBC: Recent Labs  Lab 08/04/23 1135 08/04/23 1217 08/06/23 0316 08/06/23 1015 08/07/23 0739  WBC 5.4  --  4.8 5.1 4.7  HGB 8.9* 10.2* 8.9* 9.6* 8.7*  HCT 29.7* 30.0* 29.4* 31.3* 29.2*  MCV 92.8  --  89.6 89.9 90.4  PLT 231  --  208 247 239   Basic Metabolic Panel: Recent Labs  Lab 08/04/23 1135 08/04/23 1217 08/05/23 0235 08/06/23 0316 08/06/23 1015 08/07/23 0739  NA 142 137 139 139 138 136  K 5.9* >8.5* 4.4 4.9 4.9 4.8  CL 97*  --  96* 97* 95* 95*  CO2 32  --  31 29 29 30   GLUCOSE 132*  --  154* 130* 147* 154*  BUN 66*  --  48* 60* 60* 54*  CREATININE 9.55*  --  7.42* 8.00* 8.54* 6.81*  CALCIUM 9.6  --  9.4 9.3 9.5 9.3   MG  --   --   --  2.1  --   --   PHOS  --   --  7.6* 8.7* 9.3* 8.1*     Recent Results (from the past 240 hour(s))  Blood culture (routine x 2)     Status: None (Preliminary result)   Collection Time: 08/04/23 11:18 AM   Specimen: BLOOD  Result Value Ref Range Status   Specimen Description BLOOD SITE NOT SPECIFIED  Final   Special Requests   Final    BOTTLES DRAWN AEROBIC AND ANAEROBIC Blood Culture adequate volume   Culture   Final    NO GROWTH 4 DAYS Performed at [facility], [address], [place], [place] [address]    Report Status PENDING  Incomplete  Resp panel by RT-PCR (RSV, Flu A&B, Covid) Anterior Nasal Swab     Status: None   Collection Time: 08/04/23 11:19 AM   Specimen: Anterior Nasal Swab  Result Value Ref Range  Status   SARS Coronavirus 2 by RT PCR NEGATIVE NEGATIVE Final   Influenza A by PCR NEGATIVE NEGATIVE Final   Influenza B by PCR NEGATIVE NEGATIVE Final    Comment: (NOTE) The Xpert Xpress SARS-CoV-2/FLU/RSV plus assay is intended as an aid in the diagnosis of influenza from Nasopharyngeal swab specimens and should not be used as a sole basis for treatment. Nasal washings and aspirates are unacceptable for Xpert Xpress SARS-CoV-2/FLU/RSV testing.  Fact Sheet for Patients: BloggerCourse.com  Fact Sheet for Healthcare Providers: SeriousBroker.it  This test is not yet approved or cleared by the [place] FDA and has been authorized for detection and/or diagnosis of SARS-CoV-2 by FDA under an Emergency Use Authorization (EUA). This EUA will remain in effect (meaning this test can be used) for the duration of the COVID-19 declaration under Section 564(b)(1) of the Act, 21 U.S.C. section 360bbb-3(b)(1), unless the authorization is terminated or revoked.     Resp Syncytial Virus by PCR NEGATIVE NEGATIVE Final    Comment: (NOTE) Fact Sheet for  Patients: BloggerCourse.com  Fact Sheet for Healthcare Providers: SeriousBroker.it  This test is not yet approved or cleared by the [place] FDA and has been authorized for detection and/or diagnosis of SARS-CoV-2 by FDA under an Emergency Use Authorization (EUA). This EUA will remain in effect (meaning this test can be used) for the duration of the COVID-19 declaration under Section 564(b)(1) of the Act, 21 U.S.C. section 360bbb-3(b)(1), unless the authorization is terminated or revoked.  Performed at [facility], [address] [place], [place] [address]   Blood culture (routine x 2)     Status: None (Preliminary result)   Collection Time: 08/05/23  2:36 AM   Specimen: BLOOD  Result Value Ref Range Status   Specimen Description BLOOD BLOOD RIGHT ARM  Final   Special Requests   Final    BOTTLES DRAWN AEROBIC AND ANAEROBIC Blood Culture adequate volume   Culture   Final    NO GROWTH 3 DAYS Performed at [facility], [address] [place], [place] [address]    Report Status PENDING  Incomplete     Radiology Studies: No results found.  Scheduled Meds:  carvedilol  25 mg Oral BID WC   Chlorhexidine Gluconate Cloth  6 each Topical Q0600   darbepoetin (ARANESP) injection - DIALYSIS  200 mcg Subcutaneous Q Mon-1800   feeding supplement (NEPRO CARB STEADY)  237 mL Oral BID BM   gabapentin  200 mg Oral TID   heparin  5,000 Units Subcutaneous Q8H   hydrALAZINE  100 mg Oral TID   insulin aspart  0-15 Units Subcutaneous TID WC   insulin aspart  0-5 Units Subcutaneous QHS   isosorbide mononitrate  90 mg Oral Daily   loratadine  10 mg Oral Daily   sucroferric oxyhydroxide  1,000 mg Oral TID WC   Continuous Infusions:   LOS: 4 days   Burnadette Pop, MD [facility] P12/12/2022, 11:31 AM

## 2023-08-08 NOTE — Evaluation (Signed)
Occupational Therapy Evaluation Patient Details Name: Bobby [name] MRN: [id number] DOB: 1995/11/05 Today's Date: 08/08/2023   History of Present Illness Pt is 27 yo presenting to [facility] ED with diffuse muscle aches after missing HD session on Tuesday. Also reporting dyspnea on exertion, worsening abdominal swelling and leg swelling. Pt was able to get HD on Thursday with 6L removed. PMH includes recurrent ascites requiring paracentesis, ESRD (HD TTS), HFrEF, type 1 diabetes, HTN, obesity.   Clinical Impression   PTA, pt recently at SNF for rehab and was working on transfers and ADL. Upon eval, pt with significant pitting edema in BLE making them heavy, decreasing ROM, and with weakness. Pt additionally with DOE, decreased UE strength, endurance. Requiring up to total A +2 for LB ADL and set-up for UB ADL. Attempting standing from EOB 3x today and pt unable to fully clear hips but with good weight shift using stedy. Patient will benefit from continued inpatient follow up therapy, <3 hours/day       If plan is discharge home, recommend the following: Two people to help with walking and/or transfers;Two people to help with bathing/dressing/bathroom;Assistance with cooking/housework;Assist for transportation;Help with stairs or ramp for entrance    Functional Status Assessment  Patient has had a recent decline in their functional status and demonstrates the ability to make significant improvements in function in a reasonable and predictable amount of time.  Equipment Recommendations  Other (comment) (defer)    Recommendations for Other Services       Precautions / Restrictions Precautions Precautions: Fall Precaution Comments: LE weakness, L knee pain, massive hard LE edema Restrictions Weight Bearing Restrictions: No      Mobility Bed Mobility               General bed mobility comments: pt seated EOB and requesting to stay there at EOS    Transfers Overall transfer level:  Needs assistance Equipment used: Ambulation equipment used Transfers: Sit to/from Stand, Bed to chair/wheelchair/BSC Sit to Stand: From elevated surface, Max assist, +2 safety/equipment, +2 physical assistance, Via lift equipment          Lateral/Scoot Transfers: Mod assist, +2 safety/equipment General transfer comment: several attempts to stand to RW From highly elevated bed with mod A but pt unable, scooting to [facility] using bed pad and bed in trendelenberg though pt fatigued with scooting hips Transfer via Lift Equipment: Stedy    Balance                                           ADL either performed or assessed with clinical judgement   ADL Overall ADL's : Needs assistance/impaired Eating/Feeding: Set up;Sitting   Grooming: Set up;Sitting   Upper Body Bathing: Set up;Sitting   Lower Body Bathing: Maximal assistance;Sitting/lateral leans   Upper Body Dressing : Set up;Sitting   Lower Body Dressing: Maximal assistance;Sit to/from stand   Toilet Transfer: Total assistance;+2 for physical assistance;+2 for safety/equipment Toilet Transfer Details (indicate cue type and reason): pt attempted to stand up from EOB 3x but unable to fully rise                 Vision Patient Visual Report: No change from baseline Vision Assessment?: No apparent visual deficits     Perception Perception: Not tested       Praxis Praxis: Not tested       Pertinent Vitals/Pain Pain  Assessment Pain Assessment: Faces Faces Pain Scale: Hurts even more Pain Location: L knee and bilat LEs Pain Descriptors / Indicators: Discomfort, Tightness, Aching Pain Intervention(s): Limited activity within patient's tolerance, Monitored during session, RN gave pain meds during session (manual therapy, retrograde light massage)     Extremity/Trunk Assessment Upper Extremity Assessment Upper Extremity Assessment: Generalized weakness   Lower Extremity Assessment Lower Extremity  Assessment: Defer to PT evaluation       Communication Communication Communication: No apparent difficulties   Cognition Arousal: Alert Behavior During Therapy: WFL for tasks assessed/performed Overall Cognitive Status: No family/caregiver present to determine baseline cognitive functioning                                 General Comments: poor insight into return to mobility and needing encouragement to participate initially     General Comments       Exercises     Shoulder Instructions      Home Living Family/patient expects to be discharged to:: Private residence Living Arrangements: Other relatives (sister and 53 y/o nephew) Available Help at Discharge: Family;Available PRN/intermittently Type of Home: House Home Access: Stairs to enter [employer] of Steps: 1 Entrance Stairs-Rails: None Home Layout: One level     Bathroom Shower/Tub: Chief Strategy Officer: Standard     Home Equipment: Tub bench;Shower seat;BSC/3in1;Wheelchair - manual;Other (comment);Rollator (4 wheels)   Additional Comments: O2 at home, wheelchair has not been opened up yet      Prior Functioning/Environment Prior Level of Function : Needs assist       Physical Assist : Mobility (physical);ADLs (physical) Mobility (physical): Transfers;Gait ADLs (physical): Bathing;Dressing Mobility Comments: reports at rehab prior to hospitalization and had been working in therapy, walking very limited with L knee pain ADLs Comments: Pt states he was transfering to [facility] at Mod I and requires assistance with dressing and bathing.        OT Problem List: Decreased strength;Decreased activity tolerance;Impaired balance (sitting and/or standing);Decreased safety awareness;Decreased knowledge of use of DME or AE;Pain      OT Treatment/Interventions: Self-care/ADL training;Therapeutic exercise;DME and/or AE instruction;Therapeutic activities;Patient/family  education;Balance training    OT Goals(Current goals can be found in the care plan section) Acute Rehab OT Goals Patient Stated Goal: go home OT Goal Formulation: With patient Time For Goal Achievement: 08/22/23 Potential to Achieve Goals: Fair  OT Frequency: Min 1X/week    Co-evaluation              AM-PAC OT "6 Clicks" Daily Activity     Outcome Measure Help from another person eating meals?: None Help from another person taking care of personal grooming?: A Little Help from another person toileting, which includes using toliet, bedpan, or urinal?: Total Help from another person bathing (including washing, rinsing, drying)?: A Lot Help from another person to put on and taking off regular upper body clothing?: A Little Help from another person to put on and taking off regular lower body clothing?: Total 6 Click Score: 14   End of Session Equipment Utilized During Treatment: Other (comment) (stedy) Nurse Communication: Mobility status  Activity Tolerance: Patient tolerated treatment well Patient left: Other (comment) (EOB)  OT Visit Diagnosis: Unsteadiness on feet (R26.81);Muscle weakness (generalized) (M62.81);Other abnormalities of gait and mobility (R26.89);Pain Pain - part of body: Knee (bil)                Time: 3474-2595 OT Time  Calculation (min): 28 min Charges:  OT General Charges $OT Visit: 1 Visit OT Evaluation $OT Eval Moderate Complexity: 1 Mod  Tyler Deis, OTR/L [facility] Acute Rehabilitation Office: [phone]   Bobby [name] 08/08/2023, 4:30 PM

## 2023-08-08 NOTE — Progress Notes (Signed)
Physical Therapy Treatment Patient Details Name: Bobby [name] MRN: [id number] DOB: 1995/11/12 Today's Date: 08/08/2023   History of Present Illness Pt is 27 yo presenting to [facility] ED with diffuse muscle aches after missing HD session on Tuesday. Also reporting dyspnea on exertion, worsening abdominal swelling and leg swelling. Pt was able to get HD on Thursday with 6L removed. PMH includes recurrent ascites requiring paracentesis, ESRD (HD TTS), HFrEF, type 1 diabetes, HTN, obesity.    PT Comments  Session focused on education of theraband HEP. HEP designed for sitting however pt supine and therex adjusted for supine for several exercises. AAROM required for LE flex of knees and max-total assist for set up of theraband at knees. PT stating "I know how" several times but required hand over hand correction of form for exercises. Completed 10-15 reps bil for UE/LE strengthening (see below for details). Will continue to progress pt as able during stay.   Medbridge: Access Code [email or web address]    If plan is discharge home, recommend the following: Two people to help with walking and/or transfers;Help with stairs or ramp for entrance;A lot of help with bathing/dressing/bathroom;Assist for transportation;Assistance with cooking/housework   Can travel by private vehicle     No  Equipment Recommendations  None recommended by PT    Recommendations for Other Services       Precautions / Restrictions Precautions Precautions: Fall Precaution Comments: LE weakness, L knee pain, massive hard LE edema Restrictions Weight Bearing Restrictions: No     Mobility  Bed Mobility               General bed mobility comments: pt seated EOB and requesting to stay there at EOS    Transfers Overall transfer level: Needs assistance Equipment used: Ambulation equipment used Transfers: Sit to/from Stand, Bed to chair/wheelchair/BSC Sit to Stand: From elevated surface, Max assist, +2 safety/equipment, +2  physical assistance, Via lift equipment          Lateral/Scoot Transfers: Mod assist, +2 safety/equipment General transfer comment: several attempts to stand to RW From highly elevated bed with mod A but pt unable, scooting to [facility] using bed pad and bed in trendelenberg though pt fatigued with scooting hips Transfer via Lift Equipment: Stedy  Ambulation/Gait                   Stairs             Wheelchair Mobility     Tilt Bed    Modified Rankin (Stroke Patients Only)       Balance                                            Cognition Arousal: Alert Behavior During Therapy: WFL for tasks assessed/performed Overall Cognitive Status: No family/caregiver present to determine baseline cognitive functioning                                          Exercises General Exercises - Upper Extremity Shoulder ABduction: AROM, Both, 10 reps, Supine, Theraband, Limitations Theraband Level (Shoulder Abduction): Level 1 (Yellow) Shoulder Abduction Limitations: D1 flexion Elbow Flexion: AROM, Both, 10 reps, Supine, Theraband Theraband Level (Elbow Flexion): Level 2 (Red) General Exercises - Lower Extremity Heel Slides: AAROM, Both, 10 reps, Supine, Limitations Heel Slides  Limitations: AAROM knee flex; AROM leg extension with resistance; Level 2 (Red); Hip ABduction/ADduction: AROM, Both, 15 reps, Supine, Limitations Hip Abduction/Adduction Limitations: Level 2 (Red);    General Comments        Pertinent Vitals/Pain Pain Assessment Pain Assessment: Faces Faces Pain Scale: Hurts even more Pain Location: L knee and bilat LEs Pain Descriptors / Indicators: Discomfort, Tightness, Aching Pain Intervention(s): Limited activity within patient's tolerance, Monitored during session    Home Living Family/patient expects to be discharged to:: Private residence Living Arrangements: Other relatives (sister and 68 y/o nephew) Available Help  at Discharge: Family;Available PRN/intermittently Type of Home: House Home Access: Stairs to enter Entrance Stairs-Rails: None Entrance Stairs-Number of Steps: 1   Home Layout: One level Home Equipment: Tub bench;Shower seat;BSC/3in1;Wheelchair - manual;Other (comment);Rollator (4 wheels) Additional Comments: O2 at home, wheelchair has not been opened up yet    Prior Function            PT Goals (current goals can now be found in the care plan section) Acute Rehab PT Goals Patient Stated Goal: Hopeful to get home somehow, but needs rehab now PT Goal Formulation: With patient/family Time For Goal Achievement: 08/21/23 Potential to Achieve Goals: Fair Progress towards PT goals: Progressing toward goals    Frequency    Min 1X/week      PT Plan      Co-evaluation              AM-PAC PT "6 Clicks" Mobility   Outcome Measure  Help needed turning from your back to your side while in a flat bed without using bedrails?: A Little Help needed moving from lying on your back to sitting on the side of a flat bed without using bedrails?: A Lot Help needed moving to and from a bed to a chair (including a wheelchair)?: Total Help needed standing up from a chair using your arms (e.g., wheelchair or bedside chair)?: Total Help needed to walk in hospital room?: Total Help needed climbing 3-5 steps with a railing? : Total 6 Click Score: 9    End of Session Equipment Utilized During Treatment: Other (comment) (theraband) Activity Tolerance: Patient limited by pain Patient left: in bed;with call bell/phone within reach Nurse Communication: Mobility status PT Visit Diagnosis: Muscle weakness (generalized) (M62.81);Other abnormalities of gait and mobility (R26.89)     Time: 4540-9811 PT Time Calculation (min) (ACUTE ONLY): 12 min  Charges:    $Therapeutic Exercise: 8-22 mins $Therapeutic Activity: 8-22 mins PT General Charges $$ ACUTE PT VISIT: 1 Visit                      Wynn Maudlin, DPT Acute Rehabilitation Services Office [phone]  08/08/23 4:59 PM

## 2023-08-08 NOTE — Progress Notes (Signed)
[facility] NEPHROLOGY PROGRESS NOTE  Assessment/ Plan: Pt is a 27 y.o. yo male   Dialysis Orders:  HP TTS 4:15 500/A2x EDW 112 kg 2K/2Ca AVF Heparin 16109 U Last HD 11/28 - post HD wt 146 kg  Mircera 200 q 2 weeks (last 11/19) Venofer 100 x 10  Hectorol 8 q TIW  #Profound volume overload - Chronic issue.  > 30 kg over EDW on presentation.  Received serial HD treatment, last HD yesterday with 4 L UF.  Still volume up, emphasized on fluid restriction. #ESRD -  HD TTS as OP via AVF. Serial HD as above. Plan for HD tomorrow. #Hyperkalemia - has corrected with HD #Hypertension/volume  - BP acceptable. Massive volume overload as above #Anemia  -  on ESA- continue here.  Monitor hemoglobin.  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  Follow labs.   #Recurrent ascites - Defer to primary -  s/p paracentesis 12/2 of 3 liters  #Nutrition - Renal diet with fluid restriction # Pain presumably due to edema and fluid overload.  This is chronic.  Primary team is getting x-ray.  May need to rule out DVT, defer to primary team.  Subjective: Seen and examined at the bedside.  Tolerated dialysis well yesterday.  Denies nausea vomiting chest pain or shortness of breath however still reports leg pain.  Objective Vital signs in last 24 hours: Vitals:   08/08/23 0041 08/08/23 0044 08/08/23 0503 08/08/23 0752  BP:  (!) 147/73 (!) 148/73 (!) 148/84  Pulse: 89 89 94 97  Resp: 17 17 17  (!) 21  Temp: 97.6 F (36.4 C)  99.5 F (37.5 C) 100.3 F (37.9 C)  TempSrc: Oral  Oral Oral  SpO2:  100% 96% 97%  Weight:   (!) 143.2 kg   Height:       Weight change: -7.1 kg  Intake/Output Summary (Last 24 hours) at 08/08/2023 1159 Last data filed at 08/08/2023 0900 Gross per 24 hour  Intake 0 ml  Output --  Net 0 ml       Labs: RENAL PANEL Recent Labs  Lab 08/04/23 1135 08/04/23 1217 08/05/23 0235 08/06/23 0316 08/06/23 1015  08/07/23 0739  NA 142 137 139 139 138 136  K 5.9* >8.5* 4.4 4.9 4.9 4.8  CL 97*  --  96* 97* 95* 95*  CO2 32  --  31 29 29 30   GLUCOSE 132*  --  154* 130* 147* 154*  BUN 66*  --  48* 60* 60* 54*  CREATININE 9.55*  --  7.42* 8.00* 8.54* 6.81*  CALCIUM 9.6  --  9.4 9.3 9.5 9.3  MG  --   --   --  2.1  --   --   PHOS  --   --  7.6* 8.7* 9.3* 8.1*  ALBUMIN 2.3*  --  2.2* 2.0* 2.3* 2.1*    Liver Function Tests: Recent Labs  Lab 08/04/23 1135 08/05/23 0235 08/06/23 0316 08/06/23 1015 08/07/23 0739  AST 43*  --  29  --   --   ALT 35  --  30  --   --   ALKPHOS 256*  --  245*  --   --   BILITOT 1.5*  --  1.1  --   --   PROT 7.9  --  7.3  --   --   ALBUMIN 2.3*   < > 2.0* 2.3* 2.1*   < > = values in this interval  not displayed.   No results for input(s): "LIPASE", "AMYLASE" in the last 168 hours. No results for input(s): "AMMONIA" in the last 168 hours. CBC: Recent Labs    04/03/23 0048 04/18/23 1700 08/04/23 1135 08/04/23 1217 08/06/23 0316 08/06/23 1015 08/07/23 0739  HGB 9.6*   < > 8.9* 10.2* 8.9* 9.6* 8.7*  MCV 84.4   < > 92.8  --  89.6 89.9 90.4  FERRITIN 941*  --   --   --   --   --   --   TIBC 132*  --   --   --   --   --   --   IRON 27*  --   --   --   --   --   --    < > = values in this interval not displayed.    Cardiac Enzymes: Recent Labs  Lab 08/04/23 1135  CKTOTAL 253   CBG: Recent Labs  Lab 08/06/23 2058 08/07/23 0533 08/07/23 2104 08/08/23 0628 08/08/23 1024  GLUCAP 160* 136* 152* 144* 109*    Iron Studies: No results for input(s): "IRON", "TIBC", "TRANSFERRIN", "FERRITIN" in the last 72 hours. Studies/Results: No results found.  Medications: Infusions:   Scheduled Medications:  carvedilol  25 mg Oral BID WC   Chlorhexidine Gluconate Cloth  6 each Topical Q0600   darbepoetin (ARANESP) injection - DIALYSIS  200 mcg Subcutaneous Q Mon-1800   feeding supplement (NEPRO CARB STEADY)  237 mL Oral BID BM   gabapentin  200 mg Oral TID    heparin  5,000 Units Subcutaneous Q8H   hydrALAZINE  100 mg Oral TID   insulin aspart  0-15 Units Subcutaneous TID WC   insulin aspart  0-5 Units Subcutaneous QHS   isosorbide mononitrate  90 mg Oral Daily   loratadine  10 mg Oral Daily   sucroferric oxyhydroxide  1,000 mg Oral TID WC    have reviewed scheduled and prn medications.  Physical Exam: General:NAD, comfortable Heart:RRR, s1s2 nl Lungs:clear b/l, no crackle Abdomen:soft, Non-tender, distended Extremities: Peripheral edema present/anasarca+ Dialysis Access: AVF with good thrill and bruit  Rashanna Christiana Prasad Sona Nations 08/08/2023,11:59 AM  LOS: 4 days

## 2023-08-08 NOTE — TOC Initial Note (Signed)
Transition of Care [facility]) - Initial/Assessment Note    Patient Details  Name: Bobby [name] MRN: [id number] Date of Birth: 01/16/1996  [facility] CM/SW Contact:    Michaela Corner, LCSWA Phone Number: 08/08/2023, 4:06 PM  Clinical Narrative:   CSW met pt at bedside to discuss SNF recs by PT. Pt has stated that he does not want to go back to [facility] to CM. CSW explained to pt that [facility] is the only facility that will transport pt to his dialysis center, unless he wanted to change centers - then CSW can look into other SNF. Pt stated he does not want to go to a new dialysis center.   4:00PM: CSW spoke with pts sister about if a decision has been made. Sister states pt went down for an xray. Sister requested CSW reach back out tomorrow.    Expected Discharge Plan: Skilled Nursing Facility Barriers to Discharge: Continued Medical Work up, Other (must enter comment)   Patient Goals and CMS Choice Patient states their goals for this hospitalization and ongoing recovery are:: To go to another SNF          Expected Discharge Plan and Services In-house Referral: Clinical Social Work     Living arrangements for the past 2 months: Skilled Nursing Facility                                      Prior Living Arrangements/Services Living arrangements for the past 2 months: Skilled Nursing Facility Lives with:: Facility Resident Patient language and need for interpreter reviewed:: Yes Do you feel safe going back to the place where you live?: Yes      Need for Family Participation in Patient Care: Yes (Comment) Care giver support system in place?: No (comment)   Criminal Activity/Legal Involvement Pertinent to Current Situation/Hospitalization: No - Comment as needed  Activities of Daily Living   ADL Screening (condition at time of admission) Independently performs ADLs?: No Does the patient have a NEW difficulty with  bathing/dressing/toileting/self-feeding that is expected to last >3 days?: Yes (Initiates electronic notice to provider for possible OT consult) Does the patient have a NEW difficulty with getting in/out of bed, walking, or climbing stairs that is expected to last >3 days?: Yes (Initiates electronic notice to provider for possible PT consult) Does the patient have a NEW difficulty with communication that is expected to last >3 days?: No Is the patient deaf or have difficulty hearing?: Yes Does the patient have difficulty seeing, even when wearing glasses/contacts?: Yes Does the patient have difficulty concentrating, remembering, or making decisions?: No  Permission Sought/Granted Permission sought to share information with : Case Manager, Family Supports Permission granted to share information with : Yes, Verbal Permission Granted  Share Information with NAME: Sister           Emotional Assessment Appearance:: Appears stated age Attitude/Demeanor/Rapport: Engaged Affect (typically observed): Stable Orientation: : Oriented to Self, Oriented to Place, Oriented to  Time, Oriented to Situation Alcohol / Substance Use: Not Applicable Psych Involvement: No (comment)  Admission diagnosis:  Acute respiratory failure with hypoxia and hypercapnia (HCC) [J96.01, J96.02] Patient Active Problem List   Diagnosis Date Noted   Acute pulmonary edema (HCC) 08/05/2023   Acute respiratory failure with hypoxia and hypercapnia (HCC) 08/04/2023   Cirrhosis (HCC) 04/24/2023   Anasarca 04/21/2023   Ascites 04/21/2023   Hepatomegaly 04/21/2023   Splenomegaly  04/21/2023   Volume overload 04/19/2023   Acute respiratory failure with hypoxia (HCC) 04/02/2023   ESRD on dialysis (HCC) 04/02/2023   Acute on chronic HFrEF (heart failure with reduced ejection fraction) (HCC) 04/01/2023   Secondary hyperparathyroidism of renal origin (HCC) 03/29/2021   Muscle weakness (generalized) 03/17/2021   Mild  protein-calorie malnutrition (HCC) 03/14/2021   Allergy, unspecified, initial encounter 03/08/2021   Anaphylactic shock, unspecified, initial encounter 03/08/2021   Coagulation defect, unspecified (HCC) 03/08/2021   Dependence on renal dialysis (HCC) 03/08/2021   Encounter for immunization 03/08/2021   Iron deficiency anemia, unspecified 03/08/2021   Uremia 02/28/2021   Prolonged QT interval 02/28/2021   Anemia due to chronic kidney disease 02/28/2021   Diabetes 1.5, 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 Pharmacy:   [facility] [address] [place], [place] - [address] [place] [address] Phone: [phone] Fax: [phone]  [facility] [address] [place] [address] Phone: [phone] Fax: [phone]     Social Determinants of Health (SDOH) Social History: SDOH Screenings   Food Insecurity: No Food Insecurity (08/05/2023)  Recent Concern: Food Insecurity - High Risk (05/29/2023)   Received from [facility]  Housing: Low Risk  (08/05/2023)  Transportation Needs: No Transportation Needs (08/05/2023)  Recent Concern: Transportation Needs - Unmet Transportation Needs (07/10/2023)   Received from [facility]  Utilities: Not At Risk (08/05/2023)  Recent Concern: Utilities - Medium Risk (05/29/2023)   Received from [facility]  Financial Resource Strain: Low Risk  (07/20/2022)   [facility] from [facility] visits prior to 11/04/2022., [facility], [facility] visits prior to 11/04/2022., [facility]  Physical Activity: Insufficiently  Active (07/20/2022)   Received from [facility], [facility] visits prior to 11/04/2022., [facility] visits prior to 11/04/2022., [facility]  Social Connections: Unknown (07/20/2022)   [facility] from [facility] visits prior to 11/04/2022., [facility] visits prior to 11/04/2022.  Stress: No Stress Concern Present (07/20/2022)   [facility] from [facility], [facility] visits prior to 11/04/2022., [facility] visits prior to 11/04/2022., [facility]  Tobacco Use: Medium Risk (08/04/2023)   SDOH Interventions:     Readmission Risk Interventions    04/19/2023    3:26 PM 03/11/2021    3:57 PM  Readmission Risk Prevention Plan  Transportation Screening Complete Complete  PCP or Specialist Appt within 5-7 Days Complete Not Complete  Not Complete comments  03/29/21 is first available appt  Home Care Screening Complete Complete  Medication Review (RN CM) Referral to Pharmacy

## 2023-08-08 NOTE — Progress Notes (Signed)
Physical Therapy Treatment Patient Details Name: Bobby [name] MRN: [id number] DOB: 08-20-1996 Today's Date: 08/08/2023   History of Present Illness Pt is 27 yo presenting to [facility] ED with diffuse muscle aches after missing HD session on Tuesday. Also reporting dyspnea on exertion, worsening abdominal swelling and leg swelling. Pt was able to get HD on Thursday with 6L removed. PMH includes recurrent ascites requiring paracentesis, ESRD (HD TTS), HFrEF, type 1 diabetes, HTN, obesity.    PT Comments  Patient seated at EOB on arrival of PT/OT for co-treatment. Pt initially resistant to mobilize due to significant LE pain however with plan for pain medication and encouragement pt agreeable. Pt c/o bil knee pain and manual light retrograde massage completed to bil knees to reduce pain prior to attempts at mobilizing. Max assist to weight shift hips posteriorly for improved seated stability prior to EOB elevated and Stedy placement. Attempted sit<>stand x4 from EOB. Despite good effort noted to pull up on cross bar and pt unable to rise fully and achieved ~3" clearance of hips above bed. Pt repositioned sitting EOB at EOS and requesting to remain there. Pt requesting theraband exercises to complete with LE's. Will continue to progress pt as able during stay.    If plan is discharge home, recommend the following: Two people to help with walking and/or transfers;Help with stairs or ramp for entrance;A lot of help with bathing/dressing/bathroom;Assist for transportation;Assistance with cooking/housework   Can travel by private vehicle     No  Equipment Recommendations  None recommended by PT    Recommendations for Other Services       Precautions / Restrictions Precautions Precautions: Fall Precaution Comments: LE weakness, L knee pain, massive hard LE edema Restrictions Weight Bearing Restrictions: No     Mobility  Bed Mobility               General bed mobility comments: pt seated  EOB and requesting to stay there at EOS    Transfers Overall transfer level: Needs assistance Equipment used: Ambulation equipment used Transfers: Sit to/from Stand, Bed to chair/wheelchair/BSC Sit to Stand: From elevated surface, Max assist, +2 safety/equipment, +2 physical assistance, Via lift equipment          Lateral/Scoot Transfers: Mod assist, +2 safety/equipment General transfer comment: several attempts to stand to RW From highly elevated bed with mod A but pt unable, scooting to [facility] using bed pad and bed in trendelenberg though pt fatigued with scooting hips Transfer via Lift Equipment: Stedy  Ambulation/Gait                   Stairs             Wheelchair Mobility     Tilt Bed    Modified Rankin (Stroke Patients Only)       Balance                                            Cognition Arousal: Alert Behavior During Therapy: WFL for tasks assessed/performed Overall Cognitive Status: No family/caregiver present to determine baseline cognitive functioning                                          Exercises      General Comments  Pertinent Vitals/Pain Pain Assessment Pain Assessment: Faces Faces Pain Scale: Hurts even more Pain Location: L knee and bilat LEs Pain Descriptors / Indicators: Discomfort, Tightness, Aching Pain Intervention(s): Limited activity within patient's tolerance, Monitored during session, Premedicated before session, Repositioned, RN gave pain meds during session, Other (comment) (manual therapy, retrograde light massage)    Home Living                          Prior Function            PT Goals (current goals can now be found in the care plan section) Acute Rehab PT Goals Patient Stated Goal: Hopeful to get home somehow, but needs rehab now PT Goal Formulation: With patient/family Time For Goal Achievement: 08/21/23 Potential to Achieve Goals: Fair Progress  towards PT goals: Progressing toward goals    Frequency    Min 1X/week      PT Plan      Co-evaluation              AM-PAC PT "6 Clicks" Mobility   Outcome Measure  Help needed turning from your back to your side while in a flat bed without using bedrails?: A Little Help needed moving from lying on your back to sitting on the side of a flat bed without using bedrails?: A Lot Help needed moving to and from a bed to a chair (including a wheelchair)?: Total Help needed standing up from a chair using your arms (e.g., wheelchair or bedside chair)?: Total Help needed to walk in hospital room?: Total Help needed climbing 3-5 steps with a railing? : Total 6 Click Score: 9    End of Session   Activity Tolerance: Patient limited by pain Patient left: in bed;with call bell/phone within reach Nurse Communication: Mobility status PT Visit Diagnosis: Muscle weakness (generalized) (M62.81);Other abnormalities of gait and mobility (R26.89)     Time: 9147-8295 PT Time Calculation (min) (ACUTE ONLY): 30 min  Charges:    $Therapeutic Activity: 8-22 mins PT General Charges $$ ACUTE PT VISIT: 1 Visit                     Wynn Maudlin, DPT Acute Rehabilitation Services Office [phone]  08/08/23 1:55 PM

## 2023-08-09 ENCOUNTER — Inpatient Hospital Stay (HOSPITAL_COMMUNITY): Payer: Medicaid Other

## 2023-08-09 ENCOUNTER — Inpatient Hospital Stay (HOSPITAL_COMMUNITY): Payer: Medicare Other

## 2023-08-09 DIAGNOSIS — J9601 Acute respiratory failure with hypoxia: Secondary | ICD-10-CM | POA: Diagnosis not present

## 2023-08-09 DIAGNOSIS — J9602 Acute respiratory failure with hypercapnia: Secondary | ICD-10-CM | POA: Diagnosis not present

## 2023-08-09 DIAGNOSIS — R609 Edema, unspecified: Secondary | ICD-10-CM | POA: Diagnosis not present

## 2023-08-09 LAB — CBC
HCT: 27.1 % — ABNORMAL LOW (ref 39.0–52.0)
Hemoglobin: 8.1 g/dL — ABNORMAL LOW (ref 13.0–17.0)
MCH: 27.3 pg (ref 26.0–34.0)
MCHC: 29.9 g/dL — ABNORMAL LOW (ref 30.0–36.0)
MCV: 91.2 fL (ref 80.0–100.0)
Platelets: 292 10*3/uL (ref 150–400)
RBC: 2.97 MIL/uL — ABNORMAL LOW (ref 4.22–5.81)
RDW: 16.4 % — ABNORMAL HIGH (ref 11.5–15.5)
WBC: 5.7 10*3/uL (ref 4.0–10.5)
nRBC: 0 % (ref 0.0–0.2)

## 2023-08-09 LAB — CULTURE, BLOOD (ROUTINE X 2)
Culture: NO GROWTH
Special Requests: ADEQUATE

## 2023-08-09 LAB — RENAL FUNCTION PANEL
Albumin: 2.1 g/dL — ABNORMAL LOW (ref 3.5–5.0)
Anion gap: 13 (ref 5–15)
BUN: 67 mg/dL — ABNORMAL HIGH (ref 6–20)
CO2: 30 mmol/L (ref 22–32)
Calcium: 8.9 mg/dL (ref 8.9–10.3)
Chloride: 94 mmol/L — ABNORMAL LOW (ref 98–111)
Creatinine, Ser: 7.94 mg/dL — ABNORMAL HIGH (ref 0.61–1.24)
GFR, Estimated: 9 mL/min — ABNORMAL LOW (ref >60)
Glucose, Bld: 111 mg/dL — ABNORMAL HIGH (ref 70–99)
Phosphorus: 8 mg/dL — ABNORMAL HIGH (ref 2.5–4.6)
Potassium: 4.6 mmol/L (ref 3.5–5.1)
Sodium: 137 mmol/L (ref 135–145)

## 2023-08-09 LAB — GLUCOSE, CAPILLARY
Glucose-Capillary: 131 mg/dL — ABNORMAL HIGH (ref 70–99)
Glucose-Capillary: 104 mg/dL — ABNORMAL HIGH (ref 70–99)
Glucose-Capillary: 155 mg/dL — ABNORMAL HIGH (ref 70–99)
Glucose-Capillary: 135 mg/dL — ABNORMAL HIGH (ref 70–99)

## 2023-08-09 MED ORDER — LIDOCAINE HCL (PF) 1 % IJ SOLN: 5.0000 mL | INTRAMUSCULAR | Status: DC | PRN

## 2023-08-09 MED ORDER — POLYETHYLENE GLYCOL 3350 17 G PO PACK
17.0000 g | PACK | Freq: Every day | ORAL | Status: DC
  Filled 2023-08-09 (×9): qty 1

## 2023-08-09 MED ORDER — PENTAFLUOROPROP-TETRAFLUOROETH EX AERO: 1.0000 | INHALATION_SPRAY | CUTANEOUS | Status: DC | PRN

## 2023-08-09 MED ORDER — HEPARIN SODIUM (PORCINE) 1000 UNIT/ML DIALYSIS
20.0000 [IU]/kg | INTRAMUSCULAR | Status: DC | PRN
  Administered 2023-08-09: 2900 [IU] via INTRAVENOUS_CENTRAL
  Filled 2023-08-09: qty 3

## 2023-08-09 MED ORDER — LIDOCAINE-PRILOCAINE 2.5-2.5 % EX CREA: 1.0000 | TOPICAL_CREAM | CUTANEOUS | Status: DC | PRN

## 2023-08-09 MED ORDER — ANTICOAGULANT SODIUM CITRATE 4% (200MG/5ML) IV SOLN: 5.0000 mL | Status: DC | PRN

## 2023-08-09 MED ORDER — HEPARIN SODIUM (PORCINE) 1000 UNIT/ML DIALYSIS: 1000.0000 [IU] | INTRAMUSCULAR | Status: DC | PRN

## 2023-08-09 MED ORDER — ALTEPLASE 2 MG IJ SOLR: 2.0000 mg | Freq: Once | INTRAMUSCULAR | Status: DC | PRN

## 2023-08-09 NOTE — Progress Notes (Signed)
Mobility Specialist Progress Note:    08/09/23 1451  Mobility  Activity  (bed mobility)  Level of Assistance +2 (takes two people) (MaxA)  Assistive Device Other (Comment) (bed pads)  Activity Response Tolerated well  Mobility Referral Yes  Mobility visit 1 Mobility  Mobility Specialist Start Time (ACUTE ONLY) 1210  Mobility Specialist Stop Time (ACUTE ONLY) 1232  Mobility Specialist Time Calculation (min) (ACUTE ONLY) 22 min   Pt received sitting EOB, RN requesting assistance getting pt supine in bed. Attempted to get patient to laterally scoot, was able to perform 2 scoots before pt was unable to do anymore d/t fatigue. Needed ModA +2 pt swing lets up and MaxA using the bed pad to get pt to head of bed. All needs met w/ call bell and personal belongings in reach. RN in room.     Thompson Grayer Mobility Specialist  Please contact vis Secure Chat or  Rehab Office [phone]

## 2023-08-09 NOTE — Procedures (Signed)
Patient was seen on dialysis and the procedure was supervised.  BFR 400  Via AVF BP is  149/71. Goal UF 4 to 5 kg, will lower dialysate temperature to prevent from intradialytic hypotension.  Noted pelvic fracture and orthopedics was consulted.  Patient appears to be tolerating treatment well.  Letricia Krinsky Jaynie Collins 08/09/2023

## 2023-08-09 NOTE — Progress Notes (Signed)
Patient requested for Percocet 3  tablets  as the 2 do not help per his statement. Notified Dr.  Janalyn Shy and received order as indicated in the [facility].

## 2023-08-09 NOTE — Plan of Care (Signed)
  Problem: Clinical Measurements: Goal: Will remain free from infection Outcome: Progressing Goal: Respiratory complications will improve Outcome: Progressing Goal: Cardiovascular complication will be avoided Outcome: Progressing   Problem: Activity: Goal: Risk for activity intolerance will decrease Outcome: Progressing   Problem: Skin Integrity: Goal: Risk for impaired skin integrity will decrease Outcome: Progressing

## 2023-08-09 NOTE — Progress Notes (Signed)
   08/09/23 1306  Vitals  Temp 98 F (36.7 C)  Pulse Rate 87  Resp 14  BP 135/72  SpO2 100 %  O2 Device Nasal Cannula  Weight (!) 139.8 kg  Type of Weight Post-Dialysis  Oxygen Therapy  O2 Flow Rate (L/min) 2 L/min  Patient Activity (if Appropriate) In bed  Pulse Oximetry Type Continuous  Oximetry Probe Site Changed No  Post Treatment  Dialyzer Clearance Lightly streaked  Hemodialysis Intake (mL) 0 mL  Liters Processed 94  Fluid Removed (mL) 5000 mL  Tolerated HD Treatment Yes  AVG/AVF Arterial Site Held (minutes) 10 minutes  AVG/AVF Venous Site Held (minutes) 10 minutes   Received patient in bed to unit.  Alert and oriented.  Informed consent signed and in chart.   TX duration:4.0  Patient tolerated well.  Transported back to the room  Alert, without acute distress.  Hand-off given to patient's nurse.   Access used: LUAF Access issues: no complications  Total UF removed: 5000 Medication(s) given: none   [facility]

## 2023-08-09 NOTE — Progress Notes (Signed)
PROGRESS NOTE  Bobby [name]  ZOX:[id number] DOB: 1996-02-14 DOA: 08/04/2023 PCP: [facility]   Brief Narrative: Patient is a 27 year old male with history of ESRD on dialysis on TTS schedule, morbid obesity, systolic CHF, diabetes type 1, recurrent ascites requiring paracentesis, hypertension, anxiety who presented with myalgia, shortness of breath, abdominal distention, lower extremity edema.  He was found to be severely volume overloaded on presentation and on acute respiratory failure with hypoxia and hypercarbia secondary to pulmonary edema.  Lab work showed hyperkalemia.  Nephrology consulted  for serial dialysis.  Patient is from SNF.  PT/OT consulted , recommendation is to go back to SNF.  X-ray of the pelvis showed acute right superior pubic ramus fracture .  Orthopedics consulted, plan for CT pelvis  Assessment & Plan:  Principal Problem:   Acute respiratory failure with hypoxia and hypercapnia (HCC) Active Problems:   Essential hypertension   Acute on chronic HFrEF (heart failure with reduced ejection fraction) (HCC)   Acute respiratory failure with hypoxia (HCC)   ESRD on dialysis (HCC)   Ascites   Cirrhosis (HCC)   Acute pulmonary edema (HCC)   Acute on chronic  hypoxic/hypercarbic respiratory failure: Secondary to volume overload causing pulmonary edema.  Underlying OSA/OHS could be contributing.    Volume management as per dialysis. Denies any shortness of breath or cough.  As per the report, patient was on oxygen at 4 to 5 L at a SNF   Acute on chronic combined systolic-diastolic CHF/pulmonary edema: Echo on 7/24 showed EF of 30 to 35%, grade 1 diastolic dysfunction, severe left atrial, right atrial enlargement, moderate pleural effusion, moderate TR.  Repeat echo on 10/24 showed improved EF of 55 to 60%.  Volume management as per dialysis.  Appears volume overloaded  ESRD on dialysis/ hyperkalemia: Dialyzed on TTS schedule.  Nephrology following.   Potassium currently normal.  Has AV fistula on the left upper extremity  Recurrent ascites/liver cirrhosis: Unclear etiology of liver cirrhosis but could be from chronic hepatic congestion from CHF/volume overload.  Needs paracentesis biweekly.  Underwent paracentesis with removal of 3 L of fluid on 12/1.  Abdomen remains soft   Chronic pain syndrome/myalgia/lower extremity pain: Anasarca could have contributed.  Continue muscle relaxant, pain management  Type 1 diabetes: A1c of 6.2.  Continue current insulin regimen.  Hypertension: Continue current medications.  Monitor blood pressure.  Continue hydralazine, Coreg, Imdur  Anxiety disorder: On Atarax as needed  Suspected underlying OSA/OHS: Patient says he had sleep study as an outpatient.  Waiting for delivery of the machine  Chronic normocytic anemia: Likely secondary to ESRD.  Currently hemoglobin stable.  Morbid obesity: BMI of 37.7  Generalized weakness/inability to ambulate: He was from SNF.  Patient has a sister.  He used rolling walker at baseline. But recently he is minimally ambulatory.  Report of falling at [facility] few months ago.  PT/OT consulted a, recommendation is SNF.  As per the sister, the last time he worked fine was in July after that he gradually decline.  No history of trauma.  He just says that he cannot lift his legs.  X-ray of the pelvis/knees showed acute right superior pubic ramus fracture and mild to moderate suprapatellar effusion on the left.  Orthopedics ordered a CT imaging of the pelvis and left knee        DVT prophylaxis:heparin injection 5,000 Units Start: 08/04/23 1515     Code Status: Full Code  Family Communication: Called sister Ms. Alonna Minium  and discussed about management plan to assess for Patient status:Inpatient  Patient is from :home  Anticipated discharge to:SnF  Estimated DC date:  May need further dialysis as  inpatient, waiting for bed availability at the SNF, await  further imagings of the pelvis and knee   Consultants: Nephrology  Procedures:Dialysis  Antimicrobials:  Anti-infectives (From admission, onward)    None       Subjective:  Patient seen and examined at bedside today.  Hemodynamically stable.  He was on dialysis.  Denies any new complaints.  Continues to have weakness in bilateral lower extremities and says he has pain on the left lower extremity when he tries to elevate the leg  Objective: Vitals:   08/09/23 1200 08/09/23 1230 08/09/23 1245 08/09/23 1306  BP: 131/68 128/72 (!) 125/112 135/72  Pulse: 87 86 86 87  Resp: 15 14 13 14   Temp:    98 F (36.7 C)  TempSrc:      SpO2: 97% 100% 100% 100%  Weight:    (!) 139.8 kg  Height:        Intake/Output Summary (Last 24 hours) at 08/09/2023 1315 Last data filed at 08/09/2023 1306 Gross per 24 hour  Intake 960 ml  Output 5000 ml  Net -4040 ml   Filed Weights   08/09/23 0533 08/09/23 0828 08/09/23 1306  Weight: (!) 144.4 kg (!) 144.8 kg (!) 139.8 kg    Examination: General exam: Overall comfortable, not in distress, morbidly obese, deconditioned HEENT: PERRL Respiratory system:  no wheezes or crackles, diminished air sounds on bases Cardiovascular system: S1 & S2 heard, RRR.  Gastrointestinal system: Abdomen is nondistended, soft and nontender. Central nervous system: Alert and oriented Extremities: Bilateral extremity pitting edema, weakness of bilateral lower extremities, no clubbing ,no cyanosis, AV fistula in the left upper extremity Skin: No rashes, no ulcers,no icterus     Data Reviewed: I have personally reviewed following labs and imaging studies  CBC: Recent Labs  Lab 08/04/23 1135 08/04/23 1217 08/06/23 0316 08/06/23 1015 08/07/23 0739 08/09/23 0840  WBC 5.4  --  4.8 5.1 4.7 5.7  HGB 8.9* 10.2* 8.9* 9.6* 8.7* 8.1*  HCT 29.7* 30.0* 29.4* 31.3* 29.2* 27.1*  MCV 92.8  --  89.6 89.9 90.4 91.2  PLT 231  --  208 247 239 292   Basic Metabolic  Panel: Recent Labs  Lab 08/05/23 0235 08/06/23 0316 08/06/23 1015 08/07/23 0739 08/09/23 0840  NA 139 139 138 136 137  K 4.4 4.9 4.9 4.8 4.6  CL 96* 97* 95* 95* 94*  CO2 31 29 29 30 30   GLUCOSE 154* 130* 147* 154* 111*  BUN 48* 60* 60* 54* 67*  CREATININE 7.42* 8.00* 8.54* 6.81* 7.94*  CALCIUM 9.4 9.3 9.5 9.3 8.9  MG  --  2.1  --   --   --   PHOS 7.6* 8.7* 9.3* 8.1* 8.0*     Recent Results (from the past 240 hour(s))  Blood culture (routine x 2)     Status: None   Collection Time: 08/04/23 11:18 AM   Specimen: BLOOD  Result Value Ref Range Status   Specimen Description BLOOD SITE NOT SPECIFIED  Final   Special Requests   Final    BOTTLES DRAWN AEROBIC AND ANAEROBIC Blood Culture adequate volume   Culture   Final    NO GROWTH 5 DAYS Performed at [facility], [address], [place], [place] [address]    Report Status 08/09/2023 FINAL  Final  Resp panel by RT-PCR (RSV, Flu A&B, Covid) Anterior Nasal Swab     Status: None   Collection Time: 08/04/23 11:19 AM   Specimen: Anterior Nasal Swab  Result Value Ref Range Status   SARS Coronavirus 2 by RT PCR NEGATIVE NEGATIVE Final   Influenza A by PCR NEGATIVE NEGATIVE Final   Influenza B by PCR NEGATIVE NEGATIVE Final    Comment: (NOTE) The Xpert Xpress SARS-CoV-2/FLU/RSV plus assay is intended as an aid in the diagnosis of influenza from Nasopharyngeal swab specimens and should not be used as a sole basis for treatment. Nasal washings and aspirates are unacceptable for Xpert Xpress SARS-CoV-2/FLU/RSV testing.  Fact Sheet for Patients: BloggerCourse.com  Fact Sheet for Healthcare Providers: SeriousBroker.it  This test is not yet approved or cleared by the [place] FDA and has been authorized for detection and/or diagnosis of SARS-CoV-2 by FDA under an Emergency Use Authorization (EUA). This EUA will remain in effect (meaning this test can be used) for the  duration of the COVID-19 declaration under Section 564(b)(1) of the Act, 21 U.S.C. section 360bbb-3(b)(1), unless the authorization is terminated or revoked.     Resp Syncytial Virus by PCR NEGATIVE NEGATIVE Final    Comment: (NOTE) Fact Sheet for Patients: BloggerCourse.com  Fact Sheet for Healthcare Providers: SeriousBroker.it  This test is not yet approved or cleared by the [place] FDA and has been authorized for detection and/or diagnosis of SARS-CoV-2 by FDA under an Emergency Use Authorization (EUA). This EUA will remain in effect (meaning this test can be used) for the duration of the COVID-19 declaration under Section 564(b)(1) of the Act, 21 U.S.C. section 360bbb-3(b)(1), unless the authorization is terminated or revoked.  Performed at [facility], [address] [place], [place] [address]   Blood culture (routine x 2)     Status: None (Preliminary result)   Collection Time: 08/05/23  2:36 AM   Specimen: BLOOD  Result Value Ref Range Status   Specimen Description BLOOD BLOOD RIGHT ARM  Final   Special Requests   Final    BOTTLES DRAWN AEROBIC AND ANAEROBIC Blood Culture adequate volume   Culture   Final    NO GROWTH 4 DAYS Performed at [facility], [address] [place], [place] [address]    Report Status PENDING  Incomplete     Radiology Studies: DG HIP UNILAT WITH PELVIS 2-3 VIEWS RIGHT  Result Date: 08/08/2023 CLINICAL DATA:  Weakness EXAM: DG HIP (WITH OR WITHOUT PELVIS) 2-3V RIGHT COMPARISON:  None Available. FINDINGS: Body habitus limits evaluation. No evidence of acute fracture or dislocation of the right hip. There appears to be a nondisplaced fracture of the right superior pubic ramus. Probable old fracture of the left inferior pubic ramus. SI joints and symphysis pubis are not displaced. Vascular calcifications. IMPRESSION: No acute fracture or dislocation of the right hip. Probable  nondisplaced fracture of the right superior pubic ramus, possibly acute. Probable old fracture of the left inferior pubic ramus. Electronically Signed   By: Burman Nieves M.D.   On: 08/08/2023 20:32   DG HIP UNILAT WITH PELVIS 2-3 VIEWS LEFT  Result Date: 08/08/2023 CLINICAL DATA:  Weakness EXAM: DG HIP (WITH OR WITHOUT PELVIS) 2-3V LEFT COMPARISON:  CT 04/23/2023 FINDINGS: AP view is limited by pannus. No definitive fracture or malalignment. IMPRESSION: No acute osseous abnormality. Femoral head partially obscured on frontal view by the patient's pannus Electronically Signed   By: Jasmine Pang M.D.   On:  08/08/2023 20:31   DG Knee 1-2 Views Left  Result Date: 08/08/2023 CLINICAL DATA:  Weakness. EXAM: LEFT KNEE - 1-2 VIEW COMPARISON:  Right knee radiograph dated 08/08/2023. FINDINGS: There is no acute fracture or dislocation. Evaluation however is limited due to osteopenia and body habitus and soft tissue attenuation. No significant arthritic changes. There is a small to moderate suprapatellar effusion. Diffuse subcutaneous edema. Amorphous calcification of the subcutaneous soft tissues of the lower extremity likely related to chronic venous stasis. IMPRESSION: 1. No acute fracture or dislocation. 2. Small to moderate suprapatellar effusion. Electronically Signed   By: Elgie Collard M.D.   On: 08/08/2023 20:30   DG Knee 1-2 Views Right  Result Date: 08/08/2023 CLINICAL DATA:  Weakness. EXAM: RIGHT KNEE - 1-2 VIEW COMPARISON:  Left knee radiograph dated 08/08/2023. FINDINGS: There is no acute fracture or dislocation. The bones are osteopenic. No significant arthritic changes. Trace suprapatellar effusion suspected. There is diffuse subcutaneous edema. No opaque foreign object or soft tissue gas. IMPRESSION: 1. No acute fracture or dislocation. 2. Osteopenia. Electronically Signed   By: Elgie Collard M.D.   On: 08/08/2023 20:28    Scheduled Meds:  carvedilol  25 mg Oral BID WC    Chlorhexidine Gluconate Cloth  6 each Topical Q0600   darbepoetin (ARANESP) injection - DIALYSIS  200 mcg Subcutaneous Q Mon-1800   feeding supplement (NEPRO CARB STEADY)  237 mL Oral BID BM   gabapentin  200 mg Oral TID   heparin  5,000 Units Subcutaneous Q8H   hydrALAZINE  100 mg Oral TID   insulin aspart  0-15 Units Subcutaneous TID WC   insulin aspart  0-5 Units Subcutaneous QHS   isosorbide mononitrate  90 mg Oral Daily   loratadine  10 mg Oral Daily   sucroferric oxyhydroxide  1,000 mg Oral TID WC   sucroferric oxyhydroxide  1,000 mg Oral With snacks   Continuous Infusions:  anticoagulant sodium citrate       LOS: 5 days   Burnadette Pop, MD [facility] P12/01/2023, 1:15 PM

## 2023-08-09 NOTE — Consult Note (Signed)
Reason for Consult:Leg pain Referring Physician: Burnadette Pop Time called: 0759 Time at bedside: 0919   Bobby [name] is an 27 y.o. male.  HPI: Bobby [name] was admitted 4d ago with diffuse myalgias in the setting of missed HD. He's been unable to bear weight on the left leg for some months and has been getting around with WC or RW. Ambulating NWB on left has made his right knee painful. He initially said this started out of the blue but then said it began after a fall in the hospital. He thinks the pain has gotten worse if anything. X-rays showed a possible right sup pubic ramus fx and orthopedic surgery was consulted.   Past Medical History:  Diagnosis Date   Diabetes mellitus without complication (HCC)    Type I   ESRD (end stage renal disease) (HCC)    hemodialysis initiated 02/28/21   Hypertension     Past Surgical History:  Procedure Laterality Date   AV FISTULA PLACEMENT Left 03/04/2021   Procedure: Basilic Vein Transposition Left upper arm fistula;  Surgeon: Larina Earthly, MD;  Location: [facility] OR;  Service: Vascular;  Laterality: Left;  PERIPHERAL NERVE BLOCK   IR PARACENTESIS  04/19/2023   IR PARACENTESIS  04/24/2023   IR PERC TUN PERIT CATH WO PORT S&I /IMAG  02/28/2021   IR US GUIDE VASC ACCESS RIGHT  02/28/2021    Family History  Problem Relation Age of Onset   Diabetes Mother    Kidney disease Other    Hypertension Other     Social History:  reports that he has quit smoking. His smoking use included cigarettes. He has never used smokeless tobacco. He reports current drug use. Drug: Marijuana. He reports that he does not drink alcohol.  Allergies: No Known Allergies  Medications: I have reviewed the patient's current medications.  Results for orders placed or performed during the hospital encounter of 08/04/23 (from the past 48 hour(s))  Glucose, capillary     Status: Abnormal   Collection Time: 08/07/23  9:04 PM  Result Value Ref Range   Glucose-Capillary 152 (H)  70 - 99 mg/dL    Comment: Glucose reference range applies only to samples taken after fasting for at least 8 hours.  Glucose, capillary     Status: Abnormal   Collection Time: 08/08/23  6:28 AM  Result Value Ref Range   Glucose-Capillary 144 (H) 70 - 99 mg/dL    Comment: Glucose reference range applies only to samples taken after fasting for at least 8 hours.  Glucose, capillary     Status: Abnormal   Collection Time: 08/08/23 10:24 AM  Result Value Ref Range   Glucose-Capillary 109 (H) 70 - 99 mg/dL    Comment: Glucose reference range applies only to samples taken after fasting for at least 8 hours.  Glucose, capillary     Status: Abnormal   Collection Time: 08/08/23  3:39 PM  Result Value Ref Range   Glucose-Capillary 135 (H) 70 - 99 mg/dL    Comment: Glucose reference range applies only to samples taken after fasting for at least 8 hours.  Glucose, capillary     Status: Abnormal   Collection Time: 08/08/23  9:04 PM  Result Value Ref Range   Glucose-Capillary 181 (H) 70 - 99 mg/dL    Comment: Glucose reference range applies only to samples taken after fasting for at least 8 hours.  Glucose, capillary     Status: Abnormal   Collection Time: 08/09/23  5:34 AM  Result Value Ref Range   Glucose-Capillary 131 (H) 70 - 99 mg/dL    Comment: Glucose reference range applies only to samples taken after fasting for at least 8 hours.  CBC     Status: Abnormal   Collection Time: 08/09/23  8:40 AM  Result Value Ref Range   WBC 5.7 4.0 - 10.5 K/uL   RBC 2.97 (L) 4.22 - 5.81 MIL/uL   Hemoglobin 8.1 (L) 13.0 - 17.0 g/dL   HCT 28.4 (L) 13.2 - 44.0 %   MCV 91.2 80.0 - 100.0 fL   MCH 27.3 26.0 - 34.0 pg   MCHC 29.9 (L) 30.0 - 36.0 g/dL   RDW 10.2 (H) 72.5 - 36.6 %   Platelets 292 150 - 400 K/uL   nRBC 0.0 0.0 - 0.2 %    Comment: Performed at [facility], [address] [place], [place] [address]    DG HIP UNILAT WITH PELVIS 2-3 VIEWS RIGHT  Result Date: 08/08/2023 CLINICAL DATA:   Weakness EXAM: DG HIP (WITH OR WITHOUT PELVIS) 2-3V RIGHT COMPARISON:  None Available. FINDINGS: Body habitus limits evaluation. No evidence of acute fracture or dislocation of the right hip. There appears to be a nondisplaced fracture of the right superior pubic ramus. Probable old fracture of the left inferior pubic ramus. SI joints and symphysis pubis are not displaced. Vascular calcifications. IMPRESSION: No acute fracture or dislocation of the right hip. Probable nondisplaced fracture of the right superior pubic ramus, possibly acute. Probable old fracture of the left inferior pubic ramus. Electronically Signed   By: Burman Nieves M.D.   On: 08/08/2023 20:32   DG HIP UNILAT WITH PELVIS 2-3 VIEWS LEFT  Result Date: 08/08/2023 CLINICAL DATA:  Weakness EXAM: DG HIP (WITH OR WITHOUT PELVIS) 2-3V LEFT COMPARISON:  CT 04/23/2023 FINDINGS: AP view is limited by pannus. No definitive fracture or malalignment. IMPRESSION: No acute osseous abnormality. Femoral head partially obscured on frontal view by the patient's pannus Electronically Signed   By: Jasmine Pang M.D.   On: 08/08/2023 20:31   DG Knee 1-2 Views Left  Result Date: 08/08/2023 CLINICAL DATA:  Weakness. EXAM: LEFT KNEE - 1-2 VIEW COMPARISON:  Right knee radiograph dated 08/08/2023. FINDINGS: There is no acute fracture or dislocation. Evaluation however is limited due to osteopenia and body habitus and soft tissue attenuation. No significant arthritic changes. There is a small to moderate suprapatellar effusion. Diffuse subcutaneous edema. Amorphous calcification of the subcutaneous soft tissues of the lower extremity likely related to chronic venous stasis. IMPRESSION: 1. No acute fracture or dislocation. 2. Small to moderate suprapatellar effusion. Electronically Signed   By: Elgie Collard M.D.   On: 08/08/2023 20:30   DG Knee 1-2 Views Right  Result Date: 08/08/2023 CLINICAL DATA:  Weakness. EXAM: RIGHT KNEE - 1-2 VIEW COMPARISON:  Left  knee radiograph dated 08/08/2023. FINDINGS: There is no acute fracture or dislocation. The bones are osteopenic. No significant arthritic changes. Trace suprapatellar effusion suspected. There is diffuse subcutaneous edema. No opaque foreign object or soft tissue gas. IMPRESSION: 1. No acute fracture or dislocation. 2. Osteopenia. Electronically Signed   By: Elgie Collard M.D.   On: 08/08/2023 20:28    Review of Systems  HENT:  Negative for ear discharge, ear pain, hearing loss and tinnitus.   Eyes:  Negative for photophobia and pain.  Respiratory:  Negative for cough and shortness of breath.   Cardiovascular:  Negative for chest pain.  Gastrointestinal:  Negative for abdominal pain,  nausea and vomiting.  Genitourinary:  Negative for dysuria, flank pain, frequency and urgency.  Musculoskeletal:  Positive for arthralgias (Bilateral knees, L>R and left hip). Negative for back pain, myalgias and neck pain.  Neurological:  Negative for dizziness and headaches.  Hematological:  Does not bruise/bleed easily.  Psychiatric/Behavioral:  The patient is not nervous/anxious.    Blood pressure (!) 132/58, pulse 89, temperature 98.1 F (36.7 C), resp. rate 14, height 6\' 5"  (1.956 m), weight (!) 144.8 kg, SpO2 97%. Physical Exam Constitutional:      General: He is not in acute distress.    Appearance: He is well-developed. He is not diaphoretic.  HENT:     Head: Normocephalic and atraumatic.  Eyes:     General: No scleral icterus.       Right eye: No discharge.        Left eye: No discharge.     Conjunctiva/sclera: Conjunctivae normal.  Cardiovascular:     Rate and Rhythm: Normal rate and regular rhythm.  Pulmonary:     Effort: Pulmonary effort is normal. No respiratory distress.  Musculoskeletal:     Cervical back: Normal range of motion.     Comments: RLE No traumatic wounds, ecchymosis, or rash  Nontender  No knee or ankle effusion  Knee stable to varus/ valgus and anterior/posterior  stress  Sens DPN, SPN, TN intact  Motor EHL, ext, flex, evers 5/5  DP 1+, PT 0, No significant edema  LLE No traumatic wounds, ecchymosis, or rash  Mod TTP hip, mild TTP knee, pain with PROM knee >150 and <120  No knee or ankle effusion  Knee stable to varus/ valgus and anterior/posterior stress  Sens DPN, SPN, TN intact  Motor EHL, ext, flex, evers 5/5  DP 1+, PT 0, No significant edema  Skin:    General: Skin is warm and dry.  Neurological:     Mental Status: He is alert.  Psychiatric:        Mood and Affect: Mood normal.        Behavior: Behavior normal.     Assessment/Plan: BLE pain -- Will get CT pelvis and left knee to r/o occult fx though I think probability low. He may continue WBAT BLE.    Freeman Caldron, PA-C Orthopedic Surgery [phone] 08/09/2023, 9:26 AM

## 2023-08-09 NOTE — Plan of Care (Signed)
  Problem: Education: Goal: Knowledge of General Education information will improve Description: Including pain rating scale, medication(s)/side effects and non-pharmacologic comfort measures Outcome: Progressing   Problem: Clinical Measurements: Goal: Ability to maintain clinical measurements within normal limits will improve Outcome: Progressing Goal: Will remain free from infection Outcome: Progressing Goal: Cardiovascular complication will be avoided Outcome: Progressing   Problem: Nutrition: Goal: Adequate nutrition will be maintained Outcome: Progressing   Problem: Elimination: Goal: Will not experience complications related to bowel motility Outcome: Progressing   Problem: Clinical Measurements: Goal: Respiratory complications will improve Outcome: Not Progressing   Problem: Activity: Goal: Risk for activity intolerance will decrease Outcome: Not Progressing   Problem: Pain Management: Goal: General experience of comfort will improve Outcome: Not Progressing

## 2023-08-10 DIAGNOSIS — J9601 Acute respiratory failure with hypoxia: Secondary | ICD-10-CM | POA: Diagnosis not present

## 2023-08-10 DIAGNOSIS — J9602 Acute respiratory failure with hypercapnia: Secondary | ICD-10-CM | POA: Diagnosis not present

## 2023-08-10 LAB — SEDIMENTATION RATE: Sed Rate: 80 mm/h — ABNORMAL HIGH (ref 0–16)

## 2023-08-10 LAB — GLUCOSE, CAPILLARY
Glucose-Capillary: 161 mg/dL — ABNORMAL HIGH (ref 70–99)
Glucose-Capillary: 195 mg/dL — ABNORMAL HIGH (ref 70–99)
Glucose-Capillary: 161 mg/dL — ABNORMAL HIGH (ref 70–99)
Glucose-Capillary: 147 mg/dL — ABNORMAL HIGH (ref 70–99)

## 2023-08-10 LAB — CULTURE, BLOOD (ROUTINE X 2)
Culture: NO GROWTH
Special Requests: ADEQUATE

## 2023-08-10 LAB — C-REACTIVE PROTEIN: CRP: 2 mg/dL — ABNORMAL HIGH (ref <1.0)

## 2023-08-10 MED ORDER — CHLORHEXIDINE GLUCONATE CLOTH 2 % EX PADS
6.0000 | MEDICATED_PAD | Freq: Every day | CUTANEOUS | Status: DC
  Administered 2023-08-11 – 2023-08-13 (×3): 6 via TOPICAL

## 2023-08-10 MED ORDER — GABAPENTIN 400 MG PO CAPS: 400.0000 mg | ORAL_CAPSULE | Freq: Three times a day (TID) | ORAL | Status: DC

## 2023-08-10 MED ORDER — HYDROMORPHONE HCL 1 MG/ML IJ SOLN
0.5000 mg | Freq: Once | INTRAMUSCULAR | Status: AC
  Administered 2023-08-10: 0.5 mg via INTRAVENOUS

## 2023-08-10 MED ORDER — GABAPENTIN 300 MG PO CAPS
300.0000 mg | ORAL_CAPSULE | Freq: Two times a day (BID) | ORAL | Status: DC
  Administered 2023-08-10 – 2023-08-17 (×13): 300 mg via ORAL
  Filled 2023-08-10 (×14): qty 1

## 2023-08-10 NOTE — Plan of Care (Signed)
  Problem: Clinical Measurements: Goal: Will remain free from infection Outcome: Progressing Goal: Respiratory complications will improve Outcome: Progressing   Problem: Nutrition: Goal: Adequate nutrition will be maintained Outcome: Progressing   Problem: Coping: Goal: Level of anxiety will decrease Outcome: Progressing   Problem: Elimination: Goal: Will not experience complications related to bowel motility Outcome: Progressing   Problem: Safety: Goal: Ability to remain free from injury will improve Outcome: Progressing   Problem: Activity: Goal: Risk for activity intolerance will decrease Outcome: Not Progressing Note: Dyspnea with minimal exertion.

## 2023-08-10 NOTE — [place] FL2 (Addendum)
[place] MEDICAID FL2 LEVEL OF CARE FORM     IDENTIFICATION  Patient Name: Bobby [name] Birthdate: [birth year] Sex: male Admission Date (Current Location): 08/04/2023  [facility] and [place] Number:  Producer, television/film/video and Address:  [facility], [address] [place], [place] [address]      Provider Number: [id number]  Attending Physician Name and Address:  Burnadette Pop, MD  Relative Name and Phone Number:       Current Level of Care: Hospital Recommended Level of Care: Skilled Nursing Facility Prior Approval Number:    Date Approved/Denied:   PASRR Number: [id number]  Discharge Plan: SNF    Current Diagnoses: Patient Active Problem List   Diagnosis Date Noted   Acute pulmonary edema (HCC) 08/05/2023   Acute respiratory failure with hypoxia and hypercapnia (HCC) 08/04/2023   Cirrhosis (HCC) 04/24/2023   Anasarca 04/21/2023   Ascites 04/21/2023   Hepatomegaly 04/21/2023   Splenomegaly 04/21/2023   Volume overload 04/19/2023   Acute respiratory failure with hypoxia (HCC) 04/02/2023   ESRD on dialysis (HCC) 04/02/2023   Acute on chronic HFrEF (heart failure with reduced ejection fraction) (HCC) 04/01/2023   Secondary hyperparathyroidism of renal origin (HCC) 03/29/2021   Muscle weakness (generalized) 03/17/2021   Mild protein-calorie malnutrition (HCC) 03/14/2021   Allergy, unspecified, initial encounter 03/08/2021   Anaphylactic shock, unspecified, initial encounter 03/08/2021   Coagulation defect, unspecified (HCC) 03/08/2021   Dependence on renal dialysis (HCC) 03/08/2021   Encounter for immunization 03/08/2021   Iron deficiency anemia, unspecified 03/08/2021   Uremia 02/28/2021   Prolonged QT interval 02/28/2021   Anemia due to chronic kidney disease 02/28/2021   Diabetes 1.5, 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   Hypertensive urgency 02/28/2021   AKI (acute kidney injury) (HCC) 02/28/2021   Leg swelling 12/01/2019    Persistent proteinuria 12/01/2019   Essential hypertension 06/24/2019   Uninsured 06/24/2019   Hx of noncompliance with medical treatment, presenting hazards to health 06/06/2019    Orientation RESPIRATION BLADDER Height & Weight     Self, Time, Situation, Place  O2 (4L [place]) Continent Weight: (!) 308 lb 6.8 oz (139.9 kg) Height:  6\' 5"  (195.6 cm)  BEHAVIORAL SYMPTOMS/MOOD NEUROLOGICAL BOWEL NUTRITION STATUS      Continent Diet (see dc summary)  AMBULATORY STATUS COMMUNICATION OF NEEDS Skin   Extensive Assist Verbally Surgical wounds, Other (Comment) (Wound / Incision - Avulsion Pretibial Right;Left multiple dime sized picking scabs wounds)                       Personal Care Assistance Level of Assistance  Bathing, Feeding, Dressing Bathing Assistance: Maximum assistance Feeding assistance: Limited assistance Dressing Assistance: Maximum assistance     Functional Limitations Info  Sight, Hearing, Speech Sight Info: Impaired (L eye - impaired; R eye - blind) Hearing Info: Adequate Speech Info: Adequate    SPECIAL CARE FACTORS FREQUENCY  PT (By licensed PT), OT (By licensed OT)     PT Frequency: 5x week OT Frequency: 5x week            Contractures Contractures Info: Not present    Additional Factors Info  Code Status, Insulin Sliding Scale Code Status Info: Full     Insulin Sliding Scale Info: see dc summary       Current Medications (08/10/2023):  This is the current hospital active medication list Current Facility-Administered Medications  Medication Dose Route Frequency Provider Last Rate Last Admin   acetaminophen (TYLENOL)  tablet 650 mg  650 mg Oral Q6H PRN Steffanie Rainwater, MD   650 mg at 08/08/23 1308   Or   acetaminophen (TYLENOL) suppository 650 mg  650 mg Rectal Q6H PRN Steffanie Rainwater, MD       albuterol (PROVENTIL) (2.5 MG/3ML) 0.083% nebulizer solution 2.5 mg  2.5 mg Nebulization Q6H PRN Steffanie Rainwater, MD       carvedilol (COREG)  tablet 25 mg  25 mg Oral BID WC Steffanie Rainwater, MD   25 mg at 08/10/23 6578   Chlorhexidine Gluconate Cloth 2 % PADS 6 each  6 each Topical Q0600 Maxie Barb, MD   6 each at 08/10/23 0930   [START ON 08/11/2023] Chlorhexidine Gluconate Cloth 2 % PADS 6 each  6 each Topical Q0600 Maxie Barb, MD       cyclobenzaprine (FLEXERIL) tablet 10 mg  10 mg Oral BID PRN Steffanie Rainwater, MD   10 mg at 08/09/23 2026   Darbepoetin Alfa (ARANESP) injection 200 mcg  200 mcg Subcutaneous Q Mon-1800 Annie Sable, MD   200 mcg at 08/06/23 1744   feeding supplement (NEPRO CARB STEADY) liquid 237 mL  237 mL Oral BID BM Candelaria Stagers T, MD   237 mL at 08/10/23 0807   gabapentin (NEURONTIN) capsule 200 mg  200 mg Oral TID Burnadette Pop, MD   200 mg at 08/10/23 0806   heparin injection 5,000 Units  5,000 Units Subcutaneous Q8H Steffanie Rainwater, MD   5,000 Units at 08/10/23 1351   hydrALAZINE (APRESOLINE) tablet 100 mg  100 mg Oral TID Steffanie Rainwater, MD   100 mg at 08/10/23 0806   HYDROmorphone (DILAUDID) injection 0.5 mg  0.5 mg Intravenous Q4H PRN Burnadette Pop, MD   0.5 mg at 08/10/23 1246   hydrOXYzine (ATARAX) tablet 25 mg  25 mg Oral BID PRN Steffanie Rainwater, MD   25 mg at 08/08/23 1530   insulin aspart (novoLOG) injection 0-15 Units  0-15 Units Subcutaneous TID WC Steffanie Rainwater, MD   3 Units at 08/10/23 1237   insulin aspart (novoLOG) injection 0-5 Units  0-5 Units Subcutaneous QHS Steffanie Rainwater, MD       isosorbide mononitrate (IMDUR) 24 hr tablet 90 mg  90 mg Oral Daily Steffanie Rainwater, MD   90 mg at 08/10/23 0806   loratadine (CLARITIN) tablet 10 mg  10 mg Oral Daily Steffanie Rainwater, MD   10 mg at 08/09/23 1422   naloxone (NARCAN) injection 0.4 mg  0.4 mg Intravenous PRN Howerter, Justin B, DO       ondansetron (ZOFRAN) tablet 4 mg  4 mg Oral Q6H PRN Steffanie Rainwater, MD       Or   ondansetron (ZOFRAN) injection 4 mg  4 mg Intravenous Q6H  PRN Steffanie Rainwater, MD       Oral care mouth rinse  15 mL Mouth Rinse PRN Steffanie Rainwater, MD       oxyCODONE-acetaminophen (PERCOCET/ROXICET) 5-325 MG per tablet 2-3 tablet  2-3 tablet Oral Q6H PRN Janalyn Shy, Subrina, MD   3 tablet at 08/10/23 1001   polyethylene glycol (MIRALAX / GLYCOLAX) packet 17 g  17 g Oral Daily Adhikari, Amrit, MD       senna-docusate (Senokot-S) tablet 1 tablet  1 tablet Oral QHS PRN Steffanie Rainwater, MD       sucroferric oxyhydroxide [facility]) chewable tablet 1,000 mg  1,000 mg Oral TID WC  Maxie Barb, MD   1,000 mg at 08/10/23 1245   sucroferric oxyhydroxide (VELPHORO) chewable tablet 1,000 mg  1,000 mg Oral With snacks Quenton Fetter, RPH       traZODone (DESYREL) tablet 50 mg  50 mg Oral QHS PRN Howerter, Justin B, DO   50 mg at 08/09/23 2026     Discharge Medications: Please see discharge summary for a list of discharge medications.  Relevant Imaging Results:  Relevant Lab Results:   Additional Information SS# [id number], HD at [facility] on TTS   Michaela Corner, [place]

## 2023-08-10 NOTE — Progress Notes (Addendum)
[facility] NEPHROLOGY PROGRESS NOTE  Assessment/ Plan: Pt is a 27 y.o. yo male   Dialysis Orders:  HP TTS 4:15 500/A2x EDW 112 kg 2K/2Ca AVF Heparin 56387 U Last HD 11/28 - post HD wt 146 kg  Mircera 200 q 2 weeks (last 11/19) Venofer 100 x 10  Hectorol 8 q TIW  #Profound volume overload - Chronic issue.  > 30 kg over EDW on presentation.  Received serial HD treatment, last HD yesterday with 5 L UF.  Still volume up, emphasized on fluid restriction. #ESRD -  HD TTS as OP via AVF. Serial HD as above. Plan for HD tomorrow. #Hyperkalemia - has corrected with HD #Hypertension/volume  - BP acceptable. Massive volume overload as above #Anemia  -  on ESA- continue here.  Monitor hemoglobin.  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  Follow labs.   #Recurrent ascites -  s/p paracentesis 12/2 of 3 liters.  Patient is requesting another paracentesis, I have informed primary team. #Nutrition - Renal diet with fluid restriction # Pain presumably due to edema and fluid overload.  This is chronic.  CT scan of pelvis with no fracture.  Continue supportive care.   Subjective: Seen and examined at the bedside.  Tolerated dialysis well with UF 5 L yesterday.  Still volume up and distended abdomen asking for paracentesis.  He does not want another dialysis today.  No chest pain or shortness of breath.  Objective Vital signs in last 24 hours: Vitals:   08/09/23 2104 08/10/23 0056 08/10/23 0356 08/10/23 0810  BP: (!) 148/89 (!) 148/77 (!) 152/83 135/84  Pulse: 89 93 95 93  Resp: 18 18 19 16   Temp: 97.9 F (36.6 C) 98 F (36.7 C) 98.9 F (37.2 C) 98.3 F (36.8 C)  TempSrc: Oral Oral Oral Oral  SpO2: 100% 98% 98% 99%  Weight:   (!) 139.9 kg   Height:       Weight change: 0.4 kg  Intake/Output Summary (Last 24 hours) at 08/10/2023 1136 Last data filed at 08/10/2023 0900 Gross per 24 hour  Intake 697 ml  Output 5000 ml  Net -4303  ml       Labs: RENAL PANEL Recent Labs  Lab 08/05/23 0235 08/06/23 0316 08/06/23 1015 08/07/23 0739 08/09/23 0840  NA 139 139 138 136 137  K 4.4 4.9 4.9 4.8 4.6  CL 96* 97* 95* 95* 94*  CO2 31 29 29 30 30   GLUCOSE 154* 130* 147* 154* 111*  BUN 48* 60* 60* 54* 67*  CREATININE 7.42* 8.00* 8.54* 6.81* 7.94*  CALCIUM 9.4 9.3 9.5 9.3 8.9  MG  --  2.1  --   --   --   PHOS 7.6* 8.7* 9.3* 8.1* 8.0*  ALBUMIN 2.2* 2.0* 2.3* 2.1* 2.1*    Liver Function Tests: Recent Labs  Lab 08/04/23 1135 08/05/23 0235 08/06/23 0316 08/06/23 1015 08/07/23 0739 08/09/23 0840  AST 43*  --  29  --   --   --   ALT 35  --  30  --   --   --   ALKPHOS 256*  --  245*  --   --   --   BILITOT 1.5*  --  1.1  --   --   --   PROT 7.9  --  7.3  --   --   --   ALBUMIN 2.3*   < > 2.0* 2.3* 2.1* 2.1*   < > =  values in this interval not displayed.   No results for input(s): "LIPASE", "AMYLASE" in the last 168 hours. No results for input(s): "AMMONIA" in the last 168 hours. CBC: Recent Labs    04/03/23 0048 04/18/23 1700 08/04/23 1217 08/06/23 0316 08/06/23 1015 08/07/23 0739 08/09/23 0840  HGB 9.6*   < > 10.2* 8.9* 9.6* 8.7* 8.1*  MCV 84.4   < >  --  89.6 89.9 90.4 91.2  FERRITIN 941*  --   --   --   --   --   --   TIBC 132*  --   --   --   --   --   --   IRON 27*  --   --   --   --   --   --    < > = values in this interval not displayed.    Cardiac Enzymes: Recent Labs  Lab 08/04/23 1135  CKTOTAL 253   CBG: Recent Labs  Lab 08/09/23 1340 08/09/23 1634 08/09/23 2105 08/10/23 0759 08/10/23 1124  GLUCAP 104* 155* 135* 161* 195*    Iron Studies: No results for input(s): "IRON", "TIBC", "TRANSFERRIN", "FERRITIN" in the last 72 hours. Studies/Results: VAS [place] LOWER EXTREMITY VENOUS (DVT)  Result Date: 08/10/2023  Lower Venous DVT Study Patient Name:  Bobby [name]  Date of Exam:   08/09/2023 Medical Rec #: [id number]           Accession #:    [id number] Date of Birth: [birth year]             Patient Gender: M Patient Age:   66 years Exam Location:  [facility] Procedure:      VAS [place] LOWER EXTREMITY VENOUS (DVT) Referring Phys: AMRIT ADHIKARI --------------------------------------------------------------------------------  Indications: Swelling, Edema, and Severe pitting edema.  Limitations: Body habitus and poor ultrasound/tissue interface. Performing Technologist: Fernande Bras  Examination Guidelines: A complete evaluation includes B-mode imaging, spectral Doppler, color Doppler, and power Doppler as needed of all accessible portions of each vessel. Bilateral testing is considered an integral part of a complete examination. Limited examinations for reoccurring indications may be performed as noted. The reflux portion of the exam is performed with the patient in reverse Trendelenburg.  +---------+---------------+---------+-----------+----------+----------------+ RIGHT    CompressibilityPhasicitySpontaneityPropertiesThrombus Aging   +---------+---------------+---------+-----------+----------+----------------+ CFV      Full           Yes      Yes                                   +---------+---------------+---------+-----------+----------+----------------+ SFJ      Full                                                          +---------+---------------+---------+-----------+----------+----------------+ FV Prox  Full                                                          +---------+---------------+---------+-----------+----------+----------------+ FV Mid   Full                                                          +---------+---------------+---------+-----------+----------+----------------+  FV DistalFull                                                          +---------+---------------+---------+-----------+----------+----------------+ PFV      Full                                                           +---------+---------------+---------+-----------+----------+----------------+ POP      Full           Yes      Yes                                   +---------+---------------+---------+-----------+----------+----------------+ PTV                                                   Patent by color. +---------+---------------+---------+-----------+----------+----------------+ PERO                                                  Patent by color. +---------+---------------+---------+-----------+----------+----------------+   +---------+---------------+---------+-----------+----------+----------------+ LEFT     CompressibilityPhasicitySpontaneityPropertiesThrombus Aging   +---------+---------------+---------+-----------+----------+----------------+ CFV      Full           Yes      Yes                                   +---------+---------------+---------+-----------+----------+----------------+ SFJ      Full                                                          +---------+---------------+---------+-----------+----------+----------------+ FV Prox  Full                                                          +---------+---------------+---------+-----------+----------+----------------+ FV Mid   Full                                                          +---------+---------------+---------+-----------+----------+----------------+ FV DistalFull                                                          +---------+---------------+---------+-----------+----------+----------------+  PFV      Full                                                          +---------+---------------+---------+-----------+----------+----------------+ POP      Full           Yes      Yes                                   +---------+---------------+---------+-----------+----------+----------------+ PTV                                                   Patent by  color. +---------+---------------+---------+-----------+----------+----------------+ PERO                                                  Patent by color. +---------+---------------+---------+-----------+----------+----------------+     Summary: RIGHT: - There is no evidence of deep vein thrombosis in the lower extremity. However, portions of this examination were limited- see technologist comments above.  - No cystic structure found in the popliteal fossa.  LEFT: - There is no evidence of deep vein thrombosis in the lower extremity. However, portions of this examination were limited- see technologist comments above.  - No cystic structure found in the popliteal fossa.  *See table(s) above for measurements and observations. Electronically signed by Heath Lark on 08/10/2023 at 9:37:12 AM.    Final    CT KNEE LEFT WO CONTRAST  Result Date: 08/09/2023 CLINICAL DATA:  Knee trauma, clinical concern for occult fracture. EXAM: CT OF THE LEFT KNEE WITHOUT CONTRAST TECHNIQUE: Multidetector CT imaging of the left knee was performed according to the standard protocol. Multiplanar CT image reconstructions were also generated. RADIATION DOSE REDUCTION: This exam was performed according to the departmental dose-optimization program which includes automated exposure control, adjustment of the mA and/or kV according to patient size and/or use of iterative reconstruction technique. COMPARISON:  Radiographs 08/08/2023 FINDINGS: Body habitus reduces diagnostic sensitivity and specificity. Bones/Joint/Cartilage Mild medial compartmental articular space narrowing may be a manifestation of mild degenerative chondral thinning in the medial compartment. No CT findings of acute fracture. There is a moderate knee effusion. No free osteochondral fragment identified. Ligaments Suboptimally assessed by CT. Muscles and Tendons Unremarkable Soft tissues Substantial and advanced for age atheromatous vascular calcification. Extensive  diffuse subcutaneous edema circumferentially in the distal thigh, knee, and calf. Extensive speckled subcutaneous calcifications in the calf, likely from chronic venous insufficiency. IMPRESSION: 1. No CT findings of acute fracture. 2. Moderate knee effusion. 3. Extensive diffuse subcutaneous edema circumferentially in the distal thigh, knee, and calf. 4. Extensive speckled subcutaneous calcifications in the calf, likely from chronic venous insufficiency. 5. Substantial and advanced for age atheromatous vascular calcification. 6. Mild medial compartmental articular space narrowing may be a manifestation of mild degenerative chondral thinning in the medial compartment. Electronically Signed   By: Gaylyn Rong M.D.   On: 08/09/2023 21:03   CT PELVIS WO CONTRAST  Result  Date: 08/09/2023 CLINICAL DATA:  Hip fracture suspected, x-ray performed EXAM: CT PELVIS WITHOUT CONTRAST TECHNIQUE: Multidetector CT imaging of the pelvis was performed following the standard protocol without intravenous contrast. RADIATION DOSE REDUCTION: This exam was performed according to the departmental dose-optimization program which includes automated exposure control, adjustment of the mA and/or kV according to patient size and/or use of iterative reconstruction technique. COMPARISON:  Hip radiograph from 1 day prior FINDINGS: Negative for hip or pelvic ring fracture. No malalignment. No hip joint effusion, erosion, or degenerative spurring. There is pronounced anasarca with intermuscular edema symmetrically seen around the pelvis. Large volume ascites. IMPRESSION: 1. No acute osseous finding. 2. Large volume ascites and extensive anasarca. Electronically Signed   By: Tiburcio Pea M.D.   On: 08/09/2023 20:11   DG HIP UNILAT WITH PELVIS 2-3 VIEWS RIGHT  Result Date: 08/08/2023 CLINICAL DATA:  Weakness EXAM: DG HIP (WITH OR WITHOUT PELVIS) 2-3V RIGHT COMPARISON:  None Available. FINDINGS: Body habitus limits evaluation. No  evidence of acute fracture or dislocation of the right hip. There appears to be a nondisplaced fracture of the right superior pubic ramus. Probable old fracture of the left inferior pubic ramus. SI joints and symphysis pubis are not displaced. Vascular calcifications. IMPRESSION: No acute fracture or dislocation of the right hip. Probable nondisplaced fracture of the right superior pubic ramus, possibly acute. Probable old fracture of the left inferior pubic ramus. Electronically Signed   By: Burman Nieves M.D.   On: 08/08/2023 20:32   DG HIP UNILAT WITH PELVIS 2-3 VIEWS LEFT  Result Date: 08/08/2023 CLINICAL DATA:  Weakness EXAM: DG HIP (WITH OR WITHOUT PELVIS) 2-3V LEFT COMPARISON:  CT 04/23/2023 FINDINGS: AP view is limited by pannus. No definitive fracture or malalignment. IMPRESSION: No acute osseous abnormality. Femoral head partially obscured on frontal view by the patient's pannus Electronically Signed   By: Jasmine Pang M.D.   On: 08/08/2023 20:31   DG Knee 1-2 Views Left  Result Date: 08/08/2023 CLINICAL DATA:  Weakness. EXAM: LEFT KNEE - 1-2 VIEW COMPARISON:  Right knee radiograph dated 08/08/2023. FINDINGS: There is no acute fracture or dislocation. Evaluation however is limited due to osteopenia and body habitus and soft tissue attenuation. No significant arthritic changes. There is a small to moderate suprapatellar effusion. Diffuse subcutaneous edema. Amorphous calcification of the subcutaneous soft tissues of the lower extremity likely related to chronic venous stasis. IMPRESSION: 1. No acute fracture or dislocation. 2. Small to moderate suprapatellar effusion. Electronically Signed   By: Elgie Collard M.D.   On: 08/08/2023 20:30   DG Knee 1-2 Views Right  Result Date: 08/08/2023 CLINICAL DATA:  Weakness. EXAM: RIGHT KNEE - 1-2 VIEW COMPARISON:  Left knee radiograph dated 08/08/2023. FINDINGS: There is no acute fracture or dislocation. The bones are osteopenic. No significant  arthritic changes. Trace suprapatellar effusion suspected. There is diffuse subcutaneous edema. No opaque foreign object or soft tissue gas. IMPRESSION: 1. No acute fracture or dislocation. 2. Osteopenia. Electronically Signed   By: Elgie Collard M.D.   On: 08/08/2023 20:28    Medications: Infusions:   Scheduled Medications:  carvedilol  25 mg Oral BID WC   Chlorhexidine Gluconate Cloth  6 each Topical Q0600   darbepoetin (ARANESP) injection - DIALYSIS  200 mcg Subcutaneous Q Mon-1800   feeding supplement (NEPRO CARB STEADY)  237 mL Oral BID BM   gabapentin  200 mg Oral TID   heparin  5,000 Units Subcutaneous Q8H   hydrALAZINE  100 mg Oral  TID   insulin aspart  0-15 Units Subcutaneous TID WC   insulin aspart  0-5 Units Subcutaneous QHS   isosorbide mononitrate  90 mg Oral Daily   loratadine  10 mg Oral Daily   polyethylene glycol  17 g Oral Daily   sucroferric oxyhydroxide  1,000 mg Oral TID WC   sucroferric oxyhydroxide  1,000 mg Oral With snacks    have reviewed scheduled and prn medications.  Physical Exam: General:NAD, comfortable Heart:RRR, s1s2 nl Lungs:clear b/l, no crackle Abdomen:soft, Non-tender, distended Extremities: Peripheral edema present/anasarca+ Dialysis Access: AVF with good thrill and bruit  Cait Locust Prasad Margaretann Abate 08/10/2023,11:36 AM  LOS: 6 days

## 2023-08-10 NOTE — TOC Progression Note (Addendum)
Transition of Care [facility]) - Progression Note    Patient Details  Name: Bobby [name] MRN: [id number] Date of Birth: 1996/02/02  Transition of Care [facility]) CM/SW Contact  Michaela Corner, [place] Phone Number: 08/10/2023, 3:17 PM  Clinical Narrative:   CSW spoke with Case manager, Annice Pih, [place] at [facility] about pts experience at last SNF facility. Pt, his sister, and CM strongly stated they do not want pt to go to [facility]. CM let CSW know that pt does have medicare and that pt will need to call in order to obtain a medicare card and policy number. CSW gave pts sister, Bobby [name], fax number in case Medicare can fax pts card over.   CSW broadened SNF search: [facility] have declined at this time, [facility] has declined at this time.   CSW spoke with [place] at [facility] - she states that if pt has medicare and can pt chart can be updated to reflect such, she can look over referral for SNF.   Bobby [name] (sis) states that if no other SNF options that she will take pt home.   TOC will need to f/u with family over the weekend to see if they are/were able to obtain medicare policy number.   Expected Discharge Plan: Skilled Nursing Facility Barriers to Discharge: Continued Medical Work up, SNF Pending bed offer  Expected Discharge Plan and Services In-house Referral: Clinical Social Work     Living arrangements for the past 2 months: Skilled Nursing Facility                                       Social Determinants of Health (SDOH) Interventions SDOH Screenings   Food Insecurity: No Food Insecurity (08/05/2023)  Recent Concern: Food Insecurity - High Risk (05/29/2023)   Received from [facility]  Housing: Low Risk  (08/05/2023)  Transportation Needs: No Transportation Needs (08/05/2023)  Recent Concern: Transportation Needs - Unmet Transportation Needs (07/10/2023)   Received from [facility]  Utilities: Not At Risk (08/05/2023)  Recent Concern:  Utilities - Medium Risk (05/29/2023)   Received from [facility]  Financial Resource Strain: Low Risk  (07/20/2022)   Received [facility] visits prior to 11/04/2022., [facility], [facility] visits prior to 11/04/2022., [facility]  Physical Activity: Insufficiently Active (07/20/2022)   Received from [facility], [facility] visits prior to 11/04/2022., [facility] visits prior to 11/04/2022., [facility]  Social Connections: Unknown (07/20/2022)   [facility] visits prior to 11/04/2022., [facility] visits prior to 11/04/2022.  Stress: No Stress Concern Present (07/20/2022)   Received from [facility], [facility] visits prior to 11/04/2022., [facility] visits prior to 11/04/2022., [facility]  Tobacco Use: Medium Risk (08/04/2023)    Readmission Risk Interventions    04/19/2023    3:26 PM 03/11/2021    3:57 PM  Readmission Risk Prevention Plan  Transportation Screening Complete Complete  PCP or Specialist Appt within 5-7 Days Complete Not Complete  Not Complete comments  03/29/21 is first available appt  Home Care Screening Complete Complete  Medication Review (RN CM) Referral to Pharmacy

## 2023-08-10 NOTE — TOC Progression Note (Signed)
Transition of Care [facility]) - Progression Note    Patient Details  Name: Bobby [name] MRN: [id number] Date of Birth: March 30, 1996  Transition [facility] Care [facility]) CM/SW Contact  Michaela Corner, [place] Phone Number: 08/10/2023, 11:32 AM  Clinical Narrative:   CSW met pt in room to discuss his decision on going to [facility]. Pt asked CSW to come back later today for decision.     Expected Discharge Plan: Skilled Nursing Facility Barriers to Discharge: Continued Medical Work up, Other (must enter comment)  Expected Discharge Plan and Services In-house Referral: Clinical Social Work     Living arrangements for the past 2 months: Skilled Nursing Facility                                       Social Determinants of Health (SDOH) Interventions SDOH Screenings   Food Insecurity: No Food Insecurity (08/05/2023)  Recent Concern: Food Insecurity - High Risk (05/29/2023)   Received from [facility]  Housing: Low Risk  (08/05/2023)  Transportation Needs: No Transportation Needs (08/05/2023)  Recent Concern: Transportation Needs - Unmet Transportation Needs (07/10/2023)   Received from [facility]  Utilities: Not At Risk (08/05/2023)  Recent Concern: Utilities - Medium Risk (05/29/2023)   Received from [facility]  Financial Resource Strain: Low Risk  (07/20/2022)   [facility] visits prior to 11/04/2022., [facility], [facility] [place] visits prior to 11/04/2022., [facility]  Physical Activity: Insufficiently Active (07/20/2022)   Received from [facility], [facility] visits prior to 11/04/2022., [facility] visits prior to 11/04/2022., [facility]  Social Connections: Unknown (07/20/2022)   [facility] visits prior to 11/04/2022., [facility] visits prior to 11/04/2022.  Stress: No Stress Concern Present (07/20/2022)   Received  from [facility], [facility] visits prior to 11/04/2022., [facility] visits prior to 11/04/2022., [facility]  Tobacco Use: Medium Risk (08/04/2023)    Readmission Risk Interventions    04/19/2023    3:26 PM 03/11/2021    3:57 PM  Readmission Risk Prevention Plan  Transportation Screening Complete Complete  PCP or Specialist Appt within 5-7 Days Complete Not Complete  Not Complete comments  03/29/21 is first available appt  Home Care Screening Complete Complete  Medication Review (RN CM) Referral to Pharmacy

## 2023-08-10 NOTE — Progress Notes (Addendum)
PROGRESS NOTE  Bobby [name]  IHK:[id number] DOB: [birth year] DOA: 08/04/2023 PCP: [facility]   Brief Narrative: Patient is a 27 year old male with history of ESRD on dialysis on TTS schedule, morbid obesity, systolic CHF, diabetes type 1, recurrent ascites requiring paracentesis, hypertension, anxiety who presented with myalgia, shortness of breath, abdominal distention, lower extremity edema.  He was found to be severely volume overloaded on presentation and on acute respiratory failure with hypoxia and hypercarbia secondary to pulmonary edema.  Lab work showed hyperkalemia.  Nephrology consulted  for serial dialysis.  Patient is from SNF.  PT/OT consulted , recommendation is to go back to SNF.  Waiting for SNF bed.  Assessment & Plan:  Principal Problem:   Acute respiratory failure with hypoxia and hypercapnia (HCC) Active Problems:   Essential hypertension   Acute on chronic HFrEF (heart failure with reduced ejection fraction) (HCC)   Acute respiratory failure with hypoxia (HCC)   ESRD on dialysis (HCC)   Ascites   Cirrhosis (HCC)   Acute pulmonary edema (HCC)   Acute on chronic  hypoxic/hypercarbic respiratory failure: Secondary to volume overload causing pulmonary edema.  Underlying OSA/OHS could be contributing.    Volume management as per dialysis. Denies any shortness of breath or cough.  As per the report, patient was on oxygen at 4 to 5 L at a SNF   Acute on chronic combined systolic-diastolic CHF/pulmonary edema: Echo on 7/24 showed EF of 30 to 35%, grade 1 diastolic dysfunction, severe left atrial, right atrial enlargement, moderate pleural effusion, moderate TR.  Repeat echo on 10/24 showed improved EF of 55 to 60%.  Volume management as per dialysis.  ESRD on dialysis/ hyperkalemia: Dialyzed on TTS schedule.  Nephrology following.  Potassium currently normal.  Has AV fistula on the left upper extremity  Recurrent ascites/liver cirrhosis: Unclear  etiology of liver cirrhosis but could be from chronic hepatic congestion from CHF/volume overload.  Needs paracentesis biweekly.  Underwent paracentesis with removal of 3 L of fluid on 12/1.  Abdomen remains soft   Chronic pain syndrome/myalgia/lower extremity pain: Anasarca could have contributed.  Continue muscle relaxant, pain management  Type 1 diabetes: A1c of 6.2.  Continue current insulin regimen.  Hypertension: Continue current medications.  Monitor blood pressure.  Continue hydralazine, Coreg, Imdur  Anxiety disorder: On Atarax as needed  Suspected underlying OSA/OHS: Patient says he had sleep study as an outpatient.  Waiting for delivery of the machine  Chronic normocytic anemia: Likely secondary to ESRD.  Currently hemoglobin stable.  Morbid obesity: BMI of 37.7  Generalized weakness/inability to ambulate: He was from SNF.  Patient has a sister.  He used rolling walker at baseline. But recently he is minimally ambulatory.  Report of falling at [facility] few months ago.  PT/OT consulted a, recommendation is SNF.  As per the sister, the last time he worked fine was in July after that he gradually decline.  No history of trauma.  He just says that he cannot lift his legs.  X-ray of the pelvis/knees was suspicious for  acute right superior pubic ramus fracture and mild to moderate suprapatellar effusion on the left.  Orthopedics consulted.CT imaging of the pelvis and left knee does not show any fracture or dislocation.        DVT prophylaxis:Place and maintain sequential compression device Start: 08/10/23 0846 heparin injection 5,000 Units Start: 08/04/23 1515     Code Status: Full Code  Family Communication: Called sister Ms. Freeland  and discussed  about management plan on 12/6  Patient status:Inpatient  Patient is from :home  Anticipated discharge to:SnF  Estimated DC date: Whenever bed is available to SNF   Consultants: Nephrology,  orthopedics  Procedures:Dialysis  Antimicrobials:  Anti-infectives (From admission, onward)    None       Subjective:  Patient seen and examined at bedside today.  Hemodynamically stable.  Lying in bed.  He was on room air.  Sitting at the edge of the bed.  Denies any significant pain on the lower extremities today. Denies any worsening shortness of breath or cough.  Objective: Vitals:   08/09/23 2104 08/10/23 0056 08/10/23 0356 08/10/23 0810  BP: (!) 148/89 (!) 148/77 (!) 152/83 135/84  Pulse: 89 93 95 93  Resp: 18 18 19 16   Temp: 97.9 F (36.6 C) 98 F (36.7 C) 98.9 F (37.2 C) 98.3 F (36.8 C)  TempSrc: Oral Oral Oral Oral  SpO2: 100% 98% 98% 99%  Weight:   (!) 139.9 kg   Height:        Intake/Output Summary (Last 24 hours) at 08/10/2023 1129 Last data filed at 08/10/2023 0900 Gross per 24 hour  Intake 697 ml  Output 5000 ml  Net -4303 ml   Filed Weights   08/09/23 0828 08/09/23 1306 08/10/23 0356  Weight: (!) 144.8 kg (!) 139.8 kg (!) 139.9 kg    Examination:  General exam: Overall comfortable, not in distress, morbidly obese HEENT: PERRL Respiratory system:  no wheezes or crackles, diminished air sounds bilaterally on bases Cardiovascular system: S1 & S2 heard, RRR.  Gastrointestinal system: Abdomen is mildly distended, soft and nontender. Central nervous system: Alert and oriented Extremities: Bilateral extremity pitting edema, Av fistula on the left upper extremity Skin: No rashes, no ulcers,no icterus     Data Reviewed: I have personally reviewed following labs and imaging studies  CBC: Recent Labs  Lab 08/04/23 1135 08/04/23 1217 08/06/23 0316 08/06/23 1015 08/07/23 0739 08/09/23 0840  WBC 5.4  --  4.8 5.1 4.7 5.7  HGB 8.9* 10.2* 8.9* 9.6* 8.7* 8.1*  HCT 29.7* 30.0* 29.4* 31.3* 29.2* 27.1*  MCV 92.8  --  89.6 89.9 90.4 91.2  PLT 231  --  208 247 239 292   Basic Metabolic Panel: Recent Labs  Lab 08/05/23 0235 08/06/23 0316  08/06/23 1015 08/07/23 0739 08/09/23 0840  NA 139 139 138 136 137  K 4.4 4.9 4.9 4.8 4.6  CL 96* 97* 95* 95* 94*  CO2 31 29 29 30 30   GLUCOSE 154* 130* 147* 154* 111*  BUN 48* 60* 60* 54* 67*  CREATININE 7.42* 8.00* 8.54* 6.81* 7.94*  CALCIUM 9.4 9.3 9.5 9.3 8.9  MG  --  2.1  --   --   --   PHOS 7.6* 8.7* 9.3* 8.1* 8.0*     Recent Results (from the past 240 hour(s))  Blood culture (routine x 2)     Status: None   Collection Time: 08/04/23 11:18 AM   Specimen: BLOOD  Result Value Ref Range Status   Specimen Description BLOOD SITE NOT SPECIFIED  Final   Special Requests   Final    BOTTLES DRAWN AEROBIC AND ANAEROBIC Blood Culture adequate volume   Culture   Final    NO GROWTH 5 DAYS Performed at [facility], [address], [place], [place] [address]    Report Status 08/09/2023 FINAL  Final  Resp panel by RT-PCR (RSV, Flu A&B, Covid) Anterior Nasal Swab  Status: None   Collection Time: 08/04/23 11:19 AM   Specimen: Anterior Nasal Swab  Result Value Ref Range Status   SARS Coronavirus 2 by RT PCR NEGATIVE NEGATIVE Final   Influenza A by PCR NEGATIVE NEGATIVE Final   Influenza B by PCR NEGATIVE NEGATIVE Final    Comment: (NOTE) The Xpert Xpress SARS-CoV-2/FLU/RSV plus assay is intended as an aid in the diagnosis of influenza from Nasopharyngeal swab specimens and should not be used as a sole basis for treatment. Nasal washings and aspirates are unacceptable for Xpert Xpress SARS-CoV-2/FLU/RSV testing.  Fact Sheet for Patients: BloggerCourse.com  Fact Sheet for Healthcare Providers: SeriousBroker.it  This test is not yet approved or cleared by the [place] FDA and has been authorized for detection and/or diagnosis of SARS-CoV-2 by FDA under an Emergency Use Authorization (EUA). This EUA will remain in effect (meaning this test can be used) for the duration of the COVID-19 declaration under Section  564(b)(1) of the Act, 21 U.S.C. section 360bbb-3(b)(1), unless the authorization is terminated or revoked.     Resp Syncytial Virus by PCR NEGATIVE NEGATIVE Final    Comment: (NOTE) Fact Sheet for Patients: BloggerCourse.com  Fact Sheet for Healthcare Providers: SeriousBroker.it  This test is not yet approved or cleared by the [place] FDA and has been authorized for detection and/or diagnosis of SARS-CoV-2 by FDA under an Emergency Use Authorization (EUA). This EUA will remain in effect (meaning this test can be used) for the duration of the COVID-19 declaration under Section 564(b)(1) of the Act, 21 U.S.C. section 360bbb-3(b)(1), unless the authorization is terminated or revoked.  Performed at [facility], [address], [place], [place] [address]   Blood culture (routine x 2)     Status: None   Collection Time: 08/05/23  2:36 AM   Specimen: BLOOD  Result Value Ref Range Status   Specimen Description BLOOD BLOOD RIGHT ARM  Final   Special Requests   Final    BOTTLES DRAWN AEROBIC AND ANAEROBIC Blood Culture adequate volume   Culture   Final    NO GROWTH 5 DAYS Performed at [facility], [address], [place], [place] [address]    Report Status 08/10/2023 FINAL  Final     Radiology Studies: VAS [place] LOWER EXTREMITY VENOUS (DVT)  Result Date: 08/10/2023  Lower Venous DVT Study Patient Name:  LEMARION WALSINGHAM  Date of Exam:   08/09/2023 Medical Rec #: [id number]           Accession #:    [id number] Date of Birth: [birth year]            Patient Gender: M Patient Age:   64 years Exam Location:  [facility] Procedure:      VAS [place] LOWER EXTREMITY VENOUS (DVT) Referring Phys: Jamica Woodyard --------------------------------------------------------------------------------  Indications: Swelling, Edema, and Severe pitting edema.  Limitations: Body habitus and poor ultrasound/tissue interface. Performing  Technologist: Fernande Bras  Examination Guidelines: A complete evaluation includes B-mode imaging, spectral Doppler, color Doppler, and power Doppler as needed of all accessible portions of each vessel. Bilateral testing is considered an integral part of a complete examination. Limited examinations for reoccurring indications may be performed as noted. The reflux portion of the exam is performed with the patient in reverse Trendelenburg.  +---------+---------------+---------+-----------+----------+----------------+ RIGHT    CompressibilityPhasicitySpontaneityPropertiesThrombus Aging   +---------+---------------+---------+-----------+----------+----------------+ CFV      Full           Yes  Yes                                   +---------+---------------+---------+-----------+----------+----------------+ SFJ      Full                                                          +---------+---------------+---------+-----------+----------+----------------+ FV Prox  Full                                                          +---------+---------------+---------+-----------+----------+----------------+ FV Mid   Full                                                          +---------+---------------+---------+-----------+----------+----------------+ FV DistalFull                                                          +---------+---------------+---------+-----------+----------+----------------+ PFV      Full                                                          +---------+---------------+---------+-----------+----------+----------------+ POP      Full           Yes      Yes                                   +---------+---------------+---------+-----------+----------+----------------+ PTV                                                   Patent by color. +---------+---------------+---------+-----------+----------+----------------+ PERO                                                   Patent by color. +---------+---------------+---------+-----------+----------+----------------+   +---------+---------------+---------+-----------+----------+----------------+ LEFT     CompressibilityPhasicitySpontaneityPropertiesThrombus Aging   +---------+---------------+---------+-----------+----------+----------------+ CFV      Full           Yes      Yes                                   +---------+---------------+---------+-----------+----------+----------------+ SFJ      Full                                                          +---------+---------------+---------+-----------+----------+----------------+  FV Prox  Full                                                          +---------+---------------+---------+-----------+----------+----------------+ FV Mid   Full                                                          +---------+---------------+---------+-----------+----------+----------------+ FV DistalFull                                                          +---------+---------------+---------+-----------+----------+----------------+ PFV      Full                                                          +---------+---------------+---------+-----------+----------+----------------+ POP      Full           Yes      Yes                                   +---------+---------------+---------+-----------+----------+----------------+ PTV                                                   Patent by color. +---------+---------------+---------+-----------+----------+----------------+ PERO                                                  Patent by color. +---------+---------------+---------+-----------+----------+----------------+     Summary: RIGHT: - There is no evidence of deep vein thrombosis in the lower extremity. However, portions of this examination were limited- see technologist comments  above.  - No cystic structure found in the popliteal fossa.  LEFT: - There is no evidence of deep vein thrombosis in the lower extremity. However, portions of this examination were limited- see technologist comments above.  - No cystic structure found in the popliteal fossa.  *See table(s) above for measurements and observations. Electronically signed by Heath Lark on 08/10/2023 at 9:37:12 AM.    Final    CT KNEE LEFT WO CONTRAST  Result Date: 08/09/2023 CLINICAL DATA:  Knee trauma, clinical concern for occult fracture. EXAM: CT OF THE LEFT KNEE WITHOUT CONTRAST TECHNIQUE: Multidetector CT imaging of the left knee was performed according to the standard protocol. Multiplanar CT image reconstructions were also generated. RADIATION DOSE REDUCTION: This exam was performed according to the departmental dose-optimization program which includes automated exposure control, adjustment of the mA and/or kV according to patient size and/or use of  iterative reconstruction technique. COMPARISON:  Radiographs 08/08/2023 FINDINGS: Body habitus reduces diagnostic sensitivity and specificity. Bones/Joint/Cartilage Mild medial compartmental articular space narrowing may be a manifestation of mild degenerative chondral thinning in the medial compartment. No CT findings of acute fracture. There is a moderate knee effusion. No free osteochondral fragment identified. Ligaments Suboptimally assessed by CT. Muscles and Tendons Unremarkable Soft tissues Substantial and advanced for age atheromatous vascular calcification. Extensive diffuse subcutaneous edema circumferentially in the distal thigh, knee, and calf. Extensive speckled subcutaneous calcifications in the calf, likely from chronic venous insufficiency. IMPRESSION: 1. No CT findings of acute fracture. 2. Moderate knee effusion. 3. Extensive diffuse subcutaneous edema circumferentially in the distal thigh, knee, and calf. 4. Extensive speckled subcutaneous calcifications in  the calf, likely from chronic venous insufficiency. 5. Substantial and advanced for age atheromatous vascular calcification. 6. Mild medial compartmental articular space narrowing may be a manifestation of mild degenerative chondral thinning in the medial compartment. Electronically Signed   By: Gaylyn Rong M.D.   On: 08/09/2023 21:03   CT PELVIS WO CONTRAST  Result Date: 08/09/2023 CLINICAL DATA:  Hip fracture suspected, x-ray performed EXAM: CT PELVIS WITHOUT CONTRAST TECHNIQUE: Multidetector CT imaging of the pelvis was performed following the standard protocol without intravenous contrast. RADIATION DOSE REDUCTION: This exam was performed according to the departmental dose-optimization program which includes automated exposure control, adjustment of the mA and/or kV according to patient size and/or use of iterative reconstruction technique. COMPARISON:  Hip radiograph from 1 day prior FINDINGS: Negative for hip or pelvic ring fracture. No malalignment. No hip joint effusion, erosion, or degenerative spurring. There is pronounced anasarca with intermuscular edema symmetrically seen around the pelvis. Large volume ascites. IMPRESSION: 1. No acute osseous finding. 2. Large volume ascites and extensive anasarca. Electronically Signed   By: Tiburcio Pea M.D.   On: 08/09/2023 20:11   DG HIP UNILAT WITH PELVIS 2-3 VIEWS RIGHT  Result Date: 08/08/2023 CLINICAL DATA:  Weakness EXAM: DG HIP (WITH OR WITHOUT PELVIS) 2-3V RIGHT COMPARISON:  None Available. FINDINGS: Body habitus limits evaluation. No evidence of acute fracture or dislocation of the right hip. There appears to be a nondisplaced fracture of the right superior pubic ramus. Probable old fracture of the left inferior pubic ramus. SI joints and symphysis pubis are not displaced. Vascular calcifications. IMPRESSION: No acute fracture or dislocation of the right hip. Probable nondisplaced fracture of the right superior pubic ramus, possibly acute.  Probable old fracture of the left inferior pubic ramus. Electronically Signed   By: Burman Nieves M.D.   On: 08/08/2023 20:32   DG HIP UNILAT WITH PELVIS 2-3 VIEWS LEFT  Result Date: 08/08/2023 CLINICAL DATA:  Weakness EXAM: DG HIP (WITH OR WITHOUT PELVIS) 2-3V LEFT COMPARISON:  CT 04/23/2023 FINDINGS: AP view is limited by pannus. No definitive fracture or malalignment. IMPRESSION: No acute osseous abnormality. Femoral head partially obscured on frontal view by the patient's pannus Electronically Signed   By: Jasmine Pang M.D.   On: 08/08/2023 20:31   DG Knee 1-2 Views Left  Result Date: 08/08/2023 CLINICAL DATA:  Weakness. EXAM: LEFT KNEE - 1-2 VIEW COMPARISON:  Right knee radiograph dated 08/08/2023. FINDINGS: There is no acute fracture or dislocation. Evaluation however is limited due to osteopenia and body habitus and soft tissue attenuation. No significant arthritic changes. There is a small to moderate suprapatellar effusion. Diffuse subcutaneous edema. Amorphous calcification of the subcutaneous soft tissues of the lower extremity likely related to chronic venous stasis. IMPRESSION: 1. No  acute fracture or dislocation. 2. Small to moderate suprapatellar effusion. Electronically Signed   By: Elgie Collard M.D.   On: 08/08/2023 20:30   DG Knee 1-2 Views Right  Result Date: 08/08/2023 CLINICAL DATA:  Weakness. EXAM: RIGHT KNEE - 1-2 VIEW COMPARISON:  Left knee radiograph dated 08/08/2023. FINDINGS: There is no acute fracture or dislocation. The bones are osteopenic. No significant arthritic changes. Trace suprapatellar effusion suspected. There is diffuse subcutaneous edema. No opaque foreign object or soft tissue gas. IMPRESSION: 1. No acute fracture or dislocation. 2. Osteopenia. Electronically Signed   By: Elgie Collard M.D.   On: 08/08/2023 20:28    Scheduled Meds:  carvedilol  25 mg Oral BID WC   Chlorhexidine Gluconate Cloth  6 each Topical Q0600   darbepoetin (ARANESP)  injection - DIALYSIS  200 mcg Subcutaneous Q Mon-1800   feeding supplement (NEPRO CARB STEADY)  237 mL Oral BID BM   gabapentin  200 mg Oral TID   heparin  5,000 Units Subcutaneous Q8H   hydrALAZINE  100 mg Oral TID   insulin aspart  0-15 Units Subcutaneous TID WC   insulin aspart  0-5 Units Subcutaneous QHS   isosorbide mononitrate  90 mg Oral Daily   loratadine  10 mg Oral Daily   polyethylene glycol  17 g Oral Daily   sucroferric oxyhydroxide  1,000 mg Oral TID WC   sucroferric oxyhydroxide  1,000 mg Oral With snacks   Continuous Infusions:     LOS: 6 days   Burnadette Pop, MD [facility] P12/02/2023, 11:29 AM

## 2023-08-11 ENCOUNTER — Inpatient Hospital Stay (HOSPITAL_COMMUNITY): Payer: Medicare Other

## 2023-08-11 DIAGNOSIS — J9602 Acute respiratory failure with hypercapnia: Secondary | ICD-10-CM | POA: Diagnosis not present

## 2023-08-11 DIAGNOSIS — J9601 Acute respiratory failure with hypoxia: Secondary | ICD-10-CM | POA: Diagnosis not present

## 2023-08-11 LAB — CBC
HCT: 28.1 % — ABNORMAL LOW (ref 39.0–52.0)
Hemoglobin: 8.5 g/dL — ABNORMAL LOW (ref 13.0–17.0)
MCH: 27.3 pg (ref 26.0–34.0)
MCHC: 30.2 g/dL (ref 30.0–36.0)
MCV: 90.4 fL (ref 80.0–100.0)
Platelets: 329 10*3/uL (ref 150–400)
RBC: 3.11 MIL/uL — ABNORMAL LOW (ref 4.22–5.81)
RDW: 16.6 % — ABNORMAL HIGH (ref 11.5–15.5)
WBC: 5.4 10*3/uL (ref 4.0–10.5)
nRBC: 0 % (ref 0.0–0.2)

## 2023-08-11 LAB — RENAL FUNCTION PANEL
Albumin: 2.2 g/dL — ABNORMAL LOW (ref 3.5–5.0)
Anion gap: 15 (ref 5–15)
BUN: 69 mg/dL — ABNORMAL HIGH (ref 6–20)
CO2: 30 mmol/L (ref 22–32)
Calcium: 9.3 mg/dL (ref 8.9–10.3)
Chloride: 94 mmol/L — ABNORMAL LOW (ref 98–111)
Creatinine, Ser: 7.66 mg/dL — ABNORMAL HIGH (ref 0.61–1.24)
GFR, Estimated: 9 mL/min — ABNORMAL LOW (ref >60)
Glucose, Bld: 137 mg/dL — ABNORMAL HIGH (ref 70–99)
Phosphorus: 7.4 mg/dL — ABNORMAL HIGH (ref 2.5–4.6)
Potassium: 5 mmol/L (ref 3.5–5.1)
Sodium: 139 mmol/L (ref 135–145)

## 2023-08-11 LAB — GLUCOSE, CAPILLARY
Glucose-Capillary: 141 mg/dL — ABNORMAL HIGH (ref 70–99)
Glucose-Capillary: 124 mg/dL — ABNORMAL HIGH (ref 70–99)
Glucose-Capillary: 162 mg/dL — ABNORMAL HIGH (ref 70–99)
Glucose-Capillary: 153 mg/dL — ABNORMAL HIGH (ref 70–99)

## 2023-08-11 LAB — URIC ACID: Uric Acid, Serum: 5.7 mg/dL (ref 3.7–8.6)

## 2023-08-11 MED ORDER — PREDNISONE 20 MG PO TABS
20.0000 mg | ORAL_TABLET | Freq: Every day | ORAL | Status: AC
  Administered 2023-08-14 – 2023-08-16 (×3): 20 mg via ORAL
  Filled 2023-08-11 (×2): qty 1
  Filled 2023-08-11: qty 2

## 2023-08-11 MED ORDER — PREDNISONE 20 MG PO TABS: 40.0000 mg | ORAL_TABLET | Freq: Every day | ORAL | Status: DC

## 2023-08-11 MED ORDER — PENTAFLUOROPROP-TETRAFLUOROETH EX AERO: 1.0000 | INHALATION_SPRAY | CUTANEOUS | Status: DC | PRN

## 2023-08-11 MED ORDER — PREDNISONE 10 MG PO TABS: 10.0000 mg | ORAL_TABLET | Freq: Every day | ORAL | Status: DC

## 2023-08-11 MED ORDER — LIDOCAINE HCL (PF) 1 % IJ SOLN: 5.0000 mL | INTRAMUSCULAR | Status: DC | PRN

## 2023-08-11 MED ORDER — LIDOCAINE-PRILOCAINE 2.5-2.5 % EX CREA: 1.0000 | TOPICAL_CREAM | CUTANEOUS | Status: DC | PRN

## 2023-08-11 MED ORDER — PREDNISONE 20 MG PO TABS
40.0000 mg | ORAL_TABLET | Freq: Every day | ORAL | Status: AC
  Administered 2023-08-11 – 2023-08-13 (×3): 40 mg via ORAL
  Filled 2023-08-11 (×3): qty 2

## 2023-08-11 MED ORDER — ALBUMIN HUMAN 25 % IV SOLN
25.0000 g | Freq: Once | INTRAVENOUS | Status: AC
  Administered 2023-08-11: 25 g via INTRAVENOUS
  Filled 2023-08-11: qty 100

## 2023-08-11 MED ORDER — LORAZEPAM 2 MG/ML IJ SOLN
1.0000 mg | Freq: Once | INTRAMUSCULAR | Status: AC
  Administered 2023-08-11: 1 mg via INTRAVENOUS
  Filled 2023-08-11: qty 1

## 2023-08-11 MED ORDER — HEPARIN SODIUM (PORCINE) 1000 UNIT/ML DIALYSIS
20.0000 [IU]/kg | INTRAMUSCULAR | Status: DC | PRN
  Administered 2023-08-11: 2800 [IU] via INTRAVENOUS_CENTRAL
  Filled 2023-08-11: qty 3

## 2023-08-11 NOTE — Progress Notes (Signed)
Transport at bedside to take patient to HD. 4L Spo2 99%

## 2023-08-11 NOTE — Plan of Care (Signed)
  Problem: Clinical Measurements: Goal: Ability to maintain clinical measurements within normal limits will improve Outcome: Progressing Goal: Will remain free from infection Outcome: Progressing   Problem: Nutrition: Goal: Adequate nutrition will be maintained Outcome: Progressing   Problem: Safety: Goal: Ability to remain free from injury will improve Outcome: Progressing   Problem: Fluid Volume: Goal: Ability to maintain a balanced intake and output will improve Outcome: Progressing   Problem: Metabolic: Goal: Ability to maintain appropriate glucose levels will improve Outcome: Progressing

## 2023-08-11 NOTE — Plan of Care (Signed)
  Problem: Education: Goal: Knowledge of General Education information will improve Description: Including pain rating scale, medication(s)/side effects and non-pharmacologic comfort measures Outcome: Progressing   Problem: Health Behavior/Discharge Planning: Goal: Ability to manage health-related needs will improve Outcome: Progressing   Problem: Clinical Measurements: Goal: Ability to maintain clinical measurements within normal limits will improve Outcome: Progressing Goal: Will remain free from infection Outcome: Progressing Goal: Diagnostic test results will improve Outcome: Progressing Goal: Respiratory complications will improve Outcome: Progressing Goal: Cardiovascular complication will be avoided Outcome: Progressing   Problem: Activity: Goal: Risk for activity intolerance will decrease Outcome: Progressing   Problem: Nutrition: Goal: Adequate nutrition will be maintained Outcome: Progressing   Problem: Coping: Goal: Level of anxiety will decrease Outcome: Progressing   Problem: Elimination: Goal: Will not experience complications related to bowel motility Outcome: Progressing   Problem: Pain Management: Goal: General experience of comfort will improve Outcome: Progressing   Problem: Safety: Goal: Ability to remain free from injury will improve Outcome: Progressing   Problem: Skin Integrity: Goal: Risk for impaired skin integrity will decrease Outcome: Progressing   Problem: Education: Goal: Ability to describe self-care measures that may prevent or decrease complications (Diabetes Survival Skills Education) will improve Outcome: Progressing Goal: Individualized Educational Video(s) Outcome: Progressing   Problem: Coping: Goal: Ability to adjust to condition or change in health will improve Outcome: Progressing   Problem: Fluid Volume: Goal: Ability to maintain a balanced intake and output will improve Outcome: Progressing   Problem: Health  Behavior/Discharge Planning: Goal: Ability to identify and utilize available resources and services will improve Outcome: Progressing Goal: Ability to manage health-related needs will improve Outcome: Progressing   Problem: Metabolic: Goal: Ability to maintain appropriate glucose levels will improve Outcome: Progressing   Problem: Nutritional: Goal: Maintenance of adequate nutrition will improve Outcome: Progressing Goal: Progress toward achieving an optimal weight will improve Outcome: Progressing   Problem: Skin Integrity: Goal: Risk for impaired skin integrity will decrease Outcome: Progressing   Problem: Tissue Perfusion: Goal: Adequacy of tissue perfusion will improve Outcome: Progressing

## 2023-08-11 NOTE — Procedures (Signed)
Patient was seen on dialysis and the procedure was supervised.  BFR 400  Via AVF BP is  111/60. Lower temp to 36 for IDH, UF goal 4-5 L.   Patient appears to be tolerating treatment well.  Bobby [name] 08/11/2023

## 2023-08-11 NOTE — Procedures (Signed)
HD Note:  Some information was entered later than the data was gathered due to patient care needs. The stated time with the data is accurate.  Received patient in bed to unit.   Alert and oriented.  Patient stated a pain rating of 8/10 aching in left leg.  Patient was asleep by the time staff was. Patient did awaken and was given pain meds.  Patient had stomach cramping  Informed consent signed and in chart.   Access used: Left upper arm graft Access issues: None  Patient tolerated treatment well.   TX duration: 3 hours 43 min  Alert, without acute distress.  Total UF removed: 3300 ml  Hand-off given to patient's nurse.   Transported back to the room   Evonna Stoltz L. Dareen Piano, RN [facility].

## 2023-08-11 NOTE — Progress Notes (Addendum)
PROGRESS NOTE  Bobby [name]  JYN:[id number] DOB: Aug 24, 1996 DOA: 08/04/2023 PCP: [facility]   Brief Narrative: Patient is a 27 year old male with history of ESRD on dialysis on TTS schedule, morbid obesity, systolic CHF, diabetes type 1, recurrent ascites requiring paracentesis, hypertension, anxiety who presented with myalgia, shortness of breath, abdominal distention, lower extremity edema.  He was found to be severely volume overloaded on presentation and on acute respiratory failure with hypoxia and hypercarbia secondary to pulmonary edema.  Lab work showed hyperkalemia.  Nephrology consulted  for serial dialysis.  Patient is from SNF.  Patient has persistent weakness in bilateral lower extremities, imagings did not support any fracture or dislocation. PT/OT consulted , recommendation is to go back to SNF.  Waiting for SNF bed.  Assessment & Plan:  Principal Problem:   Acute respiratory failure with hypoxia and hypercapnia (HCC) Active Problems:   Essential hypertension   Acute on chronic HFrEF (heart failure with reduced ejection fraction) (HCC)   Acute respiratory failure with hypoxia (HCC)   ESRD on dialysis (HCC)   Ascites   Cirrhosis (HCC)   Acute pulmonary edema (HCC)   Acute on chronic  hypoxic/hypercarbic respiratory failure: Secondary to volume overload causing pulmonary edema.  Underlying OSA/OHS could be contributing.    Volume management as per dialysis. Denies any shortness of breath or cough.  As per the report, patient was on oxygen at 4 to 5 L at a SNF   Acute on chronic combined systolic-diastolic CHF/pulmonary edema: Echo on 7/24 showed EF of 30 to 35%, grade 1 diastolic dysfunction, severe left atrial, right atrial enlargement, moderate pleural effusion, moderate TR.  Repeat echo on 10/24 showed improved EF of 55 to 60%.  Volume management as per dialysis.  ESRD on dialysis/ hyperkalemia: Dialyzed on TTS schedule.  Nephrology following.   Potassium currently normal.  Has AV fistula on the left upper extremity  Recurrent ascites/liver cirrhosis: Unclear etiology of liver cirrhosis but could be from chronic hepatic congestion from CHF/volume overload.  Needs paracentesis biweekly.  Underwent paracentesis with removal of 3 L of fluid on 12/1.  Abdomen remains soft  Chronic pain syndrome/myalgia/lower extremity pain: Anasarca could have contributed.  Continue muscle relaxant, pain management  Generalized weakness/inability to ambulate: He was from SNF.  Patient has a sister.  As per the sister,  he walked  fine /playing basketball until July after that he gradually declined.  No history of trauma.  He just says that he cannot lift his legs.  X-ray of the pelvis/knees was suspicious for  acute right superior pubic ramus fracture and mild to moderate suprapatellar effusion on the left.  Orthopedics consulted.CT imaging of the pelvis and left knee does not show any fracture or dislocation.  CT of the left knee showed some moderate effusion.  Orthopedics planning to tap that knee on MondayValeta [name]). His bilateral lower extremity weakness does not have clear etiology right now.  He has elevated inflammatory markers.  Uric acid normal.  Will empirically start steroids with taper.  Inflammatory disease not excluded.  Will check ANA, rheumatoid factor.We will also check MRI of lumbar spine  Type 1 diabetes: A1c of 6.2.  Continue current insulin regimen.  Hypertension: Continue current medications.  Monitor blood pressure.  Continue hydralazine, Coreg, Imdur  Anxiety disorder: On Atarax as needed  Suspected underlying OSA/OHS: Patient says he had sleep study as an outpatient.  Waiting for delivery of the machine  Chronic normocytic anemia: Likely secondary to ESRD.  Currently hemoglobin stable.  Morbid obesity: BMI of 37.7         DVT prophylaxis:Place and maintain sequential compression device Start: 08/10/23 0846 heparin  injection 5,000 Units Start: 08/04/23 1515     Code Status: Full Code  Family Communication: Called sister Ms. Alonna Minium  and discussed about management plan on 12/7.  Patient status:Inpatient  Patient is from :home  Anticipated discharge to:SnF  Estimated DC date: after full workup, orthopedics clearance   Consultants: Nephrology, orthopedics  Procedures:Dialysis  Antimicrobials:  Anti-infectives (From admission, onward)    None       Subjective:  Patient seen and examined at dialysis suite.  Hemodynamically stable.  Overall comfortable, lying on bed. Continues to have weakness in bilateral lower extremities.  Pain is well-controlled.  Objective: Vitals:   08/11/23 1200 08/11/23 1224 08/11/23 1232 08/11/23 1319  BP: (!) 142/81 137/75 (!) 153/86 (!) 150/74  Pulse: 80 81 82 87  Resp: 14 14 13 15   Temp:    98.3 F (36.8 C)  TempSrc:    Oral  SpO2: 93% 100% 100% 98%  Weight:      Height:        Intake/Output Summary (Last 24 hours) at 08/11/2023 1350 Last data filed at 08/11/2023 1232 Gross per 24 hour  Intake 720 ml  Output 3420 ml  Net -2700 ml   Filed Weights   08/10/23 0356 08/11/23 0607 08/11/23 0822  Weight: (!) 139.9 kg (!) 144.9 kg (!) 144.6 kg    Examination:   General exam: Overall comfortable, not in distress, morbidly obese 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  Gastrointestinal system: Abdomen is mildly distended, soft and nontender. Central nervous system: Alert and oriented, weakness on bilateral lower extremities Extremities: Lower extremity pitting edema, AV fistula on the left upper extremity Skin: No rashes, no ulcers,no icterus     Data Reviewed: I have personally reviewed following labs and imaging studies  CBC: Recent Labs  Lab 08/06/23 0316 08/06/23 1015 08/07/23 0739 08/09/23 0840 08/11/23 0721  WBC 4.8 5.1 4.7  5.7 5.4  HGB 8.9* 9.6* 8.7* 8.1* 8.5*  HCT 29.4* 31.3* 29.2* 27.1* 28.1*  MCV 89.6 89.9 90.4 91.2 90.4  PLT 208 247 239 292 329   Basic Metabolic Panel: Recent Labs  Lab 08/06/23 0316 08/06/23 1015 08/07/23 0739 08/09/23 0840 08/11/23 0721  NA 139 138 136 137 139  K 4.9 4.9 4.8 4.6 5.0  CL 97* 95* 95* 94* 94*  CO2 29 29 30 30 30   GLUCOSE 130* 147* 154* 111* 137*  BUN 60* 60* 54* 67* 69*  CREATININE 8.00* 8.54* 6.81* 7.94* 7.66*  CALCIUM 9.3 9.5 9.3 8.9 9.3  MG 2.1  --   --   --   --   PHOS 8.7* 9.3* 8.1* 8.0* 7.4*     Recent Results (from the past 240 hour(s))  Blood culture (routine x 2)     Status: None   Collection Time: 08/04/23 11:18 AM   Specimen: BLOOD  Result Value Ref Range Status   Specimen Description BLOOD SITE NOT SPECIFIED  Final   Special Requests   Final    BOTTLES DRAWN AEROBIC AND ANAEROBIC Blood Culture adequate volume   Culture   Final    NO GROWTH 5 DAYS Performed at [facility], [address], [place], [place] [address]    Report Status 08/09/2023  FINAL  Final  Resp panel by RT-PCR (RSV, Flu A&B, Covid) Anterior Nasal Swab     Status: None   Collection Time: 08/04/23 11:19 AM   Specimen: Anterior Nasal Swab  Result Value Ref Range Status   SARS Coronavirus 2 by RT PCR NEGATIVE NEGATIVE Final   Influenza A by PCR NEGATIVE NEGATIVE Final   Influenza B by PCR NEGATIVE NEGATIVE Final    Comment: (NOTE) The Xpert Xpress SARS-CoV-2/FLU/RSV plus assay is intended as an aid in the diagnosis of influenza from Nasopharyngeal swab specimens and should not be used as a sole basis for treatment. Nasal washings and aspirates are unacceptable for Xpert Xpress SARS-CoV-2/FLU/RSV testing.  Fact Sheet for Patients: BloggerCourse.com  Fact Sheet for Healthcare Providers: SeriousBroker.it  This test is not yet approved or cleared by the [place] FDA and has been authorized for detection and/or  diagnosis of SARS-CoV-2 by FDA under an Emergency Use Authorization (EUA). This EUA will remain in effect (meaning this test can be used) for the duration of the COVID-19 declaration under Section 564(b)(1) of the Act, 21 U.S.C. section 360bbb-3(b)(1), unless the authorization is terminated or revoked.     Resp Syncytial Virus by PCR NEGATIVE NEGATIVE Final    Comment: (NOTE) Fact Sheet for Patients: BloggerCourse.com  Fact Sheet for Healthcare Providers: SeriousBroker.it  This test is not yet approved or cleared by the [place] FDA and has been authorized for detection and/or diagnosis of SARS-CoV-2 by FDA under an Emergency Use Authorization (EUA). This EUA will remain in effect (meaning this test can be used) for the duration of the COVID-19 declaration under Section 564(b)(1) of the Act, 21 U.S.C. section 360bbb-3(b)(1), unless the authorization is terminated or revoked.  Performed at [facility], [address] [place], [place] [address]   Blood culture (routine x 2)     Status: None   Collection Time: 08/05/23  2:36 AM   Specimen: BLOOD  Result Value Ref Range Status   Specimen Description BLOOD BLOOD RIGHT ARM  Final   Special Requests   Final    BOTTLES DRAWN AEROBIC AND ANAEROBIC Blood Culture adequate volume   Culture   Final    NO GROWTH 5 DAYS Performed at [facility], [address] [place], [place] [address]    Report Status 08/10/2023 FINAL  Final     Radiology Studies: VAS [place] LOWER EXTREMITY VENOUS (DVT)  Result Date: 08/10/2023  Lower Venous DVT Study Patient Name:  WETZEL MUHS  Date of Exam:   08/09/2023 Medical Rec #: [id number]           Accession #:    [id number] Date of Birth: 08-Jun-1996            Patient Gender: M Patient Age:   66 years Exam Location:  [facility] Procedure:      VAS [place] LOWER EXTREMITY VENOUS (DVT) Referring Phys: Linus Weckerly  --------------------------------------------------------------------------------  Indications: Swelling, Edema, and Severe pitting edema.  Limitations: Body habitus and poor ultrasound/tissue interface. Performing Technologist: Fernande Bras  Examination Guidelines: A complete evaluation includes B-mode imaging, spectral Doppler, color Doppler, and power Doppler as needed of all accessible portions of each vessel. Bilateral testing is considered an integral part of a complete examination. Limited examinations for reoccurring indications may be performed as noted. The reflux portion of the exam is performed with the patient in reverse Trendelenburg.  +---------+---------------+---------+-----------+----------+----------------+ RIGHT    CompressibilityPhasicitySpontaneityPropertiesThrombus Aging   +---------+---------------+---------+-----------+----------+----------------+ CFV  Full           Yes      Yes                                   +---------+---------------+---------+-----------+----------+----------------+ SFJ      Full                                                          +---------+---------------+---------+-----------+----------+----------------+ FV Prox  Full                                                          +---------+---------------+---------+-----------+----------+----------------+ FV Mid   Full                                                          +---------+---------------+---------+-----------+----------+----------------+ FV DistalFull                                                          +---------+---------------+---------+-----------+----------+----------------+ PFV      Full                                                          +---------+---------------+---------+-----------+----------+----------------+ POP      Full           Yes      Yes                                    +---------+---------------+---------+-----------+----------+----------------+ PTV                                                   Patent by color. +---------+---------------+---------+-----------+----------+----------------+ PERO                                                  Patent by color. +---------+---------------+---------+-----------+----------+----------------+   +---------+---------------+---------+-----------+----------+----------------+ LEFT     CompressibilityPhasicitySpontaneityPropertiesThrombus Aging   +---------+---------------+---------+-----------+----------+----------------+ CFV      Full           Yes      Yes                                   +---------+---------------+---------+-----------+----------+----------------+  SFJ      Full                                                          +---------+---------------+---------+-----------+----------+----------------+ FV Prox  Full                                                          +---------+---------------+---------+-----------+----------+----------------+ FV Mid   Full                                                          +---------+---------------+---------+-----------+----------+----------------+ FV DistalFull                                                          +---------+---------------+---------+-----------+----------+----------------+ PFV      Full                                                          +---------+---------------+---------+-----------+----------+----------------+ POP      Full           Yes      Yes                                   +---------+---------------+---------+-----------+----------+----------------+ PTV                                                   Patent by color. +---------+---------------+---------+-----------+----------+----------------+ PERO                                                  Patent by  color. +---------+---------------+---------+-----------+----------+----------------+     Summary: RIGHT: - There is no evidence of deep vein thrombosis in the lower extremity. However, portions of this examination were limited- see technologist comments above.  - No cystic structure found in the popliteal fossa.  LEFT: - There is no evidence of deep vein thrombosis in the lower extremity. However, portions of this examination were limited- see technologist comments above.  - No cystic structure found in the popliteal fossa.  *See table(s) above for measurements and observations. Electronically signed by Heath Lark on 08/10/2023 at 9:37:12 AM.    Final    CT KNEE LEFT WO CONTRAST  Result Date: 08/09/2023 CLINICAL DATA:  Knee trauma, clinical  concern for occult fracture. EXAM: CT OF THE LEFT KNEE WITHOUT CONTRAST TECHNIQUE: Multidetector CT imaging of the left knee was performed according to the standard protocol. Multiplanar CT image reconstructions were also generated. RADIATION DOSE REDUCTION: This exam was performed according to the departmental dose-optimization program which includes automated exposure control, adjustment of the mA and/or kV according to patient size and/or use of iterative reconstruction technique. COMPARISON:  Radiographs 08/08/2023 FINDINGS: Body habitus reduces diagnostic sensitivity and specificity. Bones/Joint/Cartilage Mild medial compartmental articular space narrowing may be a manifestation of mild degenerative chondral thinning in the medial compartment. No CT findings of acute fracture. There is a moderate knee effusion. No free osteochondral fragment identified. Ligaments Suboptimally assessed by CT. Muscles and Tendons Unremarkable Soft tissues Substantial and advanced for age atheromatous vascular calcification. Extensive diffuse subcutaneous edema circumferentially in the distal thigh, knee, and calf. Extensive speckled subcutaneous calcifications in the calf, likely from  chronic venous insufficiency. IMPRESSION: 1. No CT findings of acute fracture. 2. Moderate knee effusion. 3. Extensive diffuse subcutaneous edema circumferentially in the distal thigh, knee, and calf. 4. Extensive speckled subcutaneous calcifications in the calf, likely from chronic venous insufficiency. 5. Substantial and advanced for age atheromatous vascular calcification. 6. Mild medial compartmental articular space narrowing may be a manifestation of mild degenerative chondral thinning in the medial compartment. Electronically Signed   By: Gaylyn Rong M.D.   On: 08/09/2023 21:03   CT PELVIS WO CONTRAST  Result Date: 08/09/2023 CLINICAL DATA:  Hip fracture suspected, x-ray performed EXAM: CT PELVIS WITHOUT CONTRAST TECHNIQUE: Multidetector CT imaging of the pelvis was performed following the standard protocol without intravenous contrast. RADIATION DOSE REDUCTION: This exam was performed according to the departmental dose-optimization program which includes automated exposure control, adjustment of the mA and/or kV according to patient size and/or use of iterative reconstruction technique. COMPARISON:  Hip radiograph from 1 day prior FINDINGS: Negative for hip or pelvic ring fracture. No malalignment. No hip joint effusion, erosion, or degenerative spurring. There is pronounced anasarca with intermuscular edema symmetrically seen around the pelvis. Large volume ascites. IMPRESSION: 1. No acute osseous finding. 2. Large volume ascites and extensive anasarca. Electronically Signed   By: Tiburcio Pea M.D.   On: 08/09/2023 20:11    Scheduled Meds:  carvedilol  25 mg Oral BID WC   Chlorhexidine Gluconate Cloth  6 each Topical Q0600   Chlorhexidine Gluconate Cloth  6 each Topical Q0600   darbepoetin (ARANESP) injection - DIALYSIS  200 mcg Subcutaneous Q Mon-1800   feeding supplement (NEPRO CARB STEADY)  237 mL Oral BID BM   gabapentin  300 mg Oral BID   heparin  5,000 Units Subcutaneous Q8H    hydrALAZINE  100 mg Oral TID   insulin aspart  0-15 Units Subcutaneous TID WC   insulin aspart  0-5 Units Subcutaneous QHS   isosorbide mononitrate  90 mg Oral Daily   loratadine  10 mg Oral Daily   polyethylene glycol  17 g Oral Daily   [START ON 08/17/2023] predniSONE  10 mg Oral Q breakfast   [START ON 08/14/2023] predniSONE  20 mg Oral Q breakfast   predniSONE  40 mg Oral Q breakfast   sucroferric oxyhydroxide  1,000 mg Oral TID WC   sucroferric oxyhydroxide  1,000 mg Oral With snacks   Continuous Infusions:     LOS: 7 days   Burnadette Pop, MD [facility] P12/03/2023, 1:50 PM

## 2023-08-11 NOTE — Progress Notes (Signed)
Verbal order received from Dr. Crista [name] to lower machine temp to 36.

## 2023-08-11 NOTE — Progress Notes (Signed)
Patient was medicated for MRI, once in MRI he refused to have the scan

## 2023-08-11 NOTE — Progress Notes (Signed)
Patient c/o of 8/10 pain in leg. Percocet tablet brought to the bedside. Patient reports " you can leave it on the table, I'll take it after I eat". Patient informed this is a narcotic and I need to see him take it while being present. Patient reports he will take after he eats- medication wasted (pills already taken out of package)   Blood sugar 124- patient reports he wants the 2 units when his tray comes. Lunch already ordered.

## 2023-08-11 NOTE — Progress Notes (Signed)
Telemetry noted to not be on patient's chest during bedside report.

## 2023-08-12 DIAGNOSIS — J9601 Acute respiratory failure with hypoxia: Secondary | ICD-10-CM | POA: Diagnosis not present

## 2023-08-12 DIAGNOSIS — J9602 Acute respiratory failure with hypercapnia: Secondary | ICD-10-CM | POA: Diagnosis not present

## 2023-08-12 LAB — CBC WITH DIFFERENTIAL/PLATELET
Abs Immature Granulocytes: 0.01 10*3/uL (ref 0.00–0.07)
Basophils Absolute: 0 10*3/uL (ref 0.0–0.1)
Basophils Relative: 0 %
Eosinophils Absolute: 0 10*3/uL (ref 0.0–0.5)
Eosinophils Relative: 0 %
HCT: 32.8 % — ABNORMAL LOW (ref 39.0–52.0)
Hemoglobin: 9.7 g/dL — ABNORMAL LOW (ref 13.0–17.0)
Immature Granulocytes: 0 %
Lymphocytes Relative: 7 %
Lymphs Abs: 0.3 10*3/uL — ABNORMAL LOW (ref 0.7–4.0)
MCH: 26.9 pg (ref 26.0–34.0)
MCHC: 29.6 g/dL — ABNORMAL LOW (ref 30.0–36.0)
MCV: 91.1 fL (ref 80.0–100.0)
Monocytes Absolute: 0.4 10*3/uL (ref 0.1–1.0)
Monocytes Relative: 8 %
Neutro Abs: 3.8 10*3/uL (ref 1.7–7.7)
Neutrophils Relative %: 85 %
Platelets: 349 10*3/uL (ref 150–400)
RBC: 3.6 MIL/uL — ABNORMAL LOW (ref 4.22–5.81)
RDW: 16.9 % — ABNORMAL HIGH (ref 11.5–15.5)
WBC: 4.5 10*3/uL (ref 4.0–10.5)
nRBC: 0 % (ref 0.0–0.2)

## 2023-08-12 LAB — BASIC METABOLIC PANEL
Anion gap: 13 (ref 5–15)
BUN: 56 mg/dL — ABNORMAL HIGH (ref 6–20)
CO2: 30 mmol/L (ref 22–32)
Calcium: 9.1 mg/dL (ref 8.9–10.3)
Chloride: 92 mmol/L — ABNORMAL LOW (ref 98–111)
Creatinine, Ser: 6.41 mg/dL — ABNORMAL HIGH (ref 0.61–1.24)
GFR, Estimated: 11 mL/min — ABNORMAL LOW (ref >60)
Glucose, Bld: 330 mg/dL — ABNORMAL HIGH (ref 70–99)
Potassium: 4.5 mmol/L (ref 3.5–5.1)
Sodium: 135 mmol/L (ref 135–145)

## 2023-08-12 LAB — GLUCOSE, CAPILLARY
Glucose-Capillary: 269 mg/dL — ABNORMAL HIGH (ref 70–99)
Glucose-Capillary: 304 mg/dL — ABNORMAL HIGH (ref 70–99)
Glucose-Capillary: 255 mg/dL — ABNORMAL HIGH (ref 70–99)
Glucose-Capillary: 267 mg/dL — ABNORMAL HIGH (ref 70–99)
Glucose-Capillary: 256 mg/dL — ABNORMAL HIGH (ref 70–99)

## 2023-08-12 MED ORDER — CHLORHEXIDINE GLUCONATE CLOTH 2 % EX PADS
6.0000 | MEDICATED_PAD | Freq: Every day | CUTANEOUS | Status: DC
  Administered 2023-08-12 – 2023-08-13 (×2): 6 via TOPICAL

## 2023-08-12 NOTE — Progress Notes (Signed)
PROGRESS NOTE  Bobby [name]  EXB:[id number] DOB: 29-Sep-1995 DOA: 08/04/2023 PCP: [facility]   Brief Narrative: Patient is a 27 year old male with history of ESRD on dialysis on TTS schedule, morbid obesity, systolic CHF, diabetes type 1, recurrent ascites requiring paracentesis, HTN, anxiety who presented with myalgia, shortness of breath, abdominal distention, lower extremity edema.  He was found to be severely volume overloaded on presentation and on acute respiratory failure with hypoxia and hypercarbia secondary to pulmonary edema.  Lab work showed hyperkalemia.  Nephrology consulted  for serial dialysis.  Patient is from SNF.  Patient has persistent weakness in bilateral lower extremities, imagings did not support any fracture or dislocation. PT/OT consulted , recommendation is to go back to SNF.  Waiting for SNF bed.    Assessment & Plan:  Principal Problem:   Acute respiratory failure with hypoxia and hypercapnia (HCC) Active Problems:   Essential hypertension   Acute on chronic HFrEF (heart failure with reduced ejection fraction) (HCC)   Acute respiratory failure with hypoxia (HCC)   ESRD on dialysis (HCC)   Ascites   Cirrhosis (HCC)   Acute pulmonary edema (HCC)   Acute on chronic hypoxic/hypercarbic respiratory failure On oxygen at 4 to 5 L at a SNF Secondary to volume overload  Volume management as per dialysis  Acute on chronic combined systolic-diastolic CHF/pulmonary edema Echo on 10/24 showed improved EF of 55 to 60% Volume management as per dialysis.  ESRD on dialysis Dialyzed on TTS schedule Nephrology following Has AV fistula on the left upper extremity  Recurrent ascites/liver cirrhosis Unclear etiology of liver cirrhosis but could be from chronic hepatic congestion from CHF/volume overload  Needs paracentesis biweekly Underwent paracentesis with removal of 3 L of fluid on 12/1  Chronic pain syndrome/myalgia/lower extremity  pain Anasarca could have contributed.  Continue muscle relaxant, pain management  Generalized weakness/inability to ambulate As per the sister,  he walked  fine /playing basketball until July after that he gradually declined.  No history of trauma, cannot lift his legs X-ray of the pelvis/knees was suspicious for  acute right superior pubic ramus fracture and mild to moderate suprapatellar effusion on the left Orthopedics consulted, CT imaging of the pelvis and left knee does not show any fracture or dislocation, but showed some moderate effusion.  Orthopedics planning to tap that knee on MondayValeta Harms), 12/9 He has elevated inflammatory markers.  Uric acid normal, started empiric steroids with taper Inflammatory disease not excluded, ANA, rheumatoid factor pending Pt refused MRI of lumbar spine  Type 1 diabetes A1c of 6.2 Continue current insulin regimen.  Hypertension Continue hydralazine, Coreg, Imdur  Anxiety disorder On Atarax as needed  Suspected underlying OSA/OHS Patient says he had sleep study as an outpatient.  Waiting for delivery of the machine  Chronic normocytic anemia Likely secondary to ESRD.  Currently hemoglobin stable.  Obesity: BMI of 37.7         DVT prophylaxis:Place and maintain sequential compression device Start: 08/10/23 0846 heparin injection 5,000 Units Start: 08/04/23 1515     Code Status: Full Code  Family Communication: None at beside  Patient status:Inpatient  Patient is from :home  Anticipated discharge to: SNF     Consultants: Nephrology, orthopedics  Procedures:Dialysis  Antimicrobials:  Anti-infectives (From admission, onward)    None       Subjective:  Patient denies any new complaints, still unable to move his bilateral lower extremity.    Objective: Vitals:   08/12/23 0417 08/12/23 0735  08/12/23 1100 08/12/23 1513  BP: (!) 148/75 (!) 140/88 (!) 151/77 (!) 162/85  Pulse:  87 82 87  Resp: 20 18 18 18    Temp: 98.3 F (36.8 C) 98 F (36.7 C) 97.8 F (36.6 C) 97.9 F (36.6 C)  TempSrc: Oral Oral Oral Oral  SpO2:  97% 93% 96%  Weight: (!) 146.4 kg     Height:        Intake/Output Summary (Last 24 hours) at 08/12/2023 1637 Last data filed at 08/12/2023 1048 Gross per 24 hour  Intake 980 ml  Output 120 ml  Net 860 ml   Filed Weights   08/11/23 0607 08/11/23 0822 08/12/23 0417  Weight: (!) 144.9 kg (!) 144.6 kg (!) 146.4 kg    Examination:  General: NAD, 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 Cardiovascular: S1, S2 present Respiratory: Diminished breath sounds bilaterally Abdomen: Soft, nontender, distended, bowel sounds present Musculoskeletal: bilateral pedal edema noted, AV fistula left upper extremity Skin: Normal Psychiatry: Normal mood     Data Reviewed: I have personally reviewed following labs and imaging studies  CBC: Recent Labs  Lab 08/06/23 1015 08/07/23 0739 08/09/23 0840 08/11/23 0721 08/12/23 0843  WBC 5.1 4.7 5.7 5.4 4.5  NEUTROABS  --   --   --   --  3.8  HGB 9.6* 8.7* 8.1* 8.5* 9.7*  HCT 31.3* 29.2* 27.1* 28.1* 32.8*  MCV 89.9 90.4 91.2 90.4 91.1  PLT 247 239 292 329 349   Basic Metabolic Panel: Recent Labs  Lab 08/06/23 0316 08/06/23 1015 08/07/23 0739 08/09/23 0840 08/11/23 0721 08/12/23 0843  NA 139 138 136 137 139 135  K 4.9 4.9 4.8 4.6 5.0 4.5  CL 97* 95* 95* 94* 94* 92*  CO2 29 29 30 30 30 30   GLUCOSE 130* 147* 154* 111* 137* 330*  BUN 60* 60* 54* 67* 69* 56*  CREATININE 8.00* 8.54* 6.81* 7.94* 7.66* 6.41*  CALCIUM 9.3 9.5 9.3 8.9 9.3 9.1  MG 2.1  --   --   --   --   --   PHOS 8.7* 9.3* 8.1* 8.0* 7.4*  --      Recent Results (from the past 240 hour(s))  Blood culture (routine x 2)     Status: None   Collection Time: 08/04/23 11:18 AM   Specimen: BLOOD  Result Value Ref Range Status   Specimen Description BLOOD SITE NOT SPECIFIED  Final   Special Requests   Final    BOTTLES DRAWN AEROBIC AND ANAEROBIC Blood Culture adequate volume    Culture   Final    NO GROWTH 5 DAYS Performed at [facility], [address] [place], [place] [address]    Report Status 08/09/2023 FINAL  Final  Resp panel by RT-PCR (RSV, Flu A&B, Covid) Anterior Nasal Swab     Status: None   Collection Time: 08/04/23 11:19 AM   Specimen: Anterior Nasal Swab  Result Value Ref Range Status   SARS Coronavirus 2 by RT PCR NEGATIVE NEGATIVE Final   Influenza A by PCR NEGATIVE NEGATIVE Final   Influenza B by PCR NEGATIVE NEGATIVE Final    Comment: (NOTE) The Xpert Xpress SARS-CoV-2/FLU/RSV plus assay is intended as an aid in the diagnosis of influenza from Nasopharyngeal swab specimens and should not be used as a sole basis for treatment. Nasal washings and aspirates are unacceptable for Xpert Xpress SARS-CoV-2/FLU/RSV testing.  Fact Sheet for Patients: BloggerCourse.com  Fact Sheet for Healthcare Providers: SeriousBroker.it  This test is not yet  approved or cleared by the [place] and has been authorized for detection and/or diagnosis of SARS-CoV-2 by FDA under an Emergency Use Authorization (EUA). This EUA will remain in effect (meaning this test can be used) for the duration of the COVID-19 declaration under Section 564(b)(1) of the Act, 21 U.S.C. section 360bbb-3(b)(1), unless the authorization is terminated or revoked.     Resp Syncytial Virus by PCR NEGATIVE NEGATIVE Final    Comment: (NOTE) Fact Sheet for Patients: BloggerCourse.com  Fact Sheet for Healthcare Providers: SeriousBroker.it  This test is not yet approved or cleared by the [place] FDA and has been authorized for detection and/or diagnosis of SARS-CoV-2 by FDA under an Emergency Use Authorization (EUA). This EUA will remain in effect (meaning this test can be used) for the duration of the COVID-19 declaration under Section 564(b)(1) of the Act, 21  U.S.C. section 360bbb-3(b)(1), unless the authorization is terminated or revoked.  Performed at [facility], [address], [place], [place] [address]   Blood culture (routine x 2)     Status: None   Collection Time: 08/05/23  2:36 AM   Specimen: BLOOD  Result Value Ref Range Status   Specimen Description BLOOD BLOOD RIGHT ARM  Final   Special Requests   Final    BOTTLES DRAWN AEROBIC AND ANAEROBIC Blood Culture adequate volume   Culture   Final    NO GROWTH 5 DAYS Performed at [facility], [address], [place], [place] [address]    Report Status 08/10/2023 FINAL  Final     Radiology Studies: No results found.  Scheduled Meds:  carvedilol  25 mg Oral BID WC   Chlorhexidine Gluconate Cloth  6 each Topical Q0600   Chlorhexidine Gluconate Cloth  6 each Topical Q0600   Chlorhexidine Gluconate Cloth  6 each Topical Q0600   darbepoetin (ARANESP) injection - DIALYSIS  200 mcg Subcutaneous Q Mon-1800   feeding supplement (NEPRO CARB STEADY)  237 mL Oral BID BM   gabapentin  300 mg Oral BID   heparin  5,000 Units Subcutaneous Q8H   hydrALAZINE  100 mg Oral TID   insulin aspart  0-15 Units Subcutaneous TID WC   insulin aspart  0-5 Units Subcutaneous QHS   isosorbide mononitrate  90 mg Oral Daily   loratadine  10 mg Oral Daily   polyethylene glycol  17 g Oral Daily   [START ON 08/17/2023] predniSONE  10 mg Oral Q breakfast   [START ON 08/14/2023] predniSONE  20 mg Oral Q breakfast   predniSONE  40 mg Oral Q breakfast   sucroferric oxyhydroxide  1,000 mg Oral TID WC   sucroferric oxyhydroxide  1,000 mg Oral With snacks   Continuous Infusions:     LOS: 8 days   Briant Cedar, MD [facility] P12/04/2023, 4:37 PM

## 2023-08-12 NOTE — Plan of Care (Signed)
  Problem: Education: Goal: Knowledge of General Education information will improve Description: Including pain rating scale, medication(s)/side effects and non-pharmacologic comfort measures Outcome: Progressing   Problem: Clinical Measurements: Goal: Respiratory complications will improve Outcome: Progressing   Problem: Coping: Goal: Level of anxiety will decrease Outcome: Progressing   

## 2023-08-12 NOTE — Plan of Care (Signed)
  Problem: Education: Goal: Knowledge of General Education information will improve Description: Including pain rating scale, medication(s)/side effects and non-pharmacologic comfort measures Outcome: Progressing   Problem: Health Behavior/Discharge Planning: Goal: Ability to manage health-related needs will improve Outcome: Progressing   Problem: Clinical Measurements: Goal: Ability to maintain clinical measurements within normal limits will improve Outcome: Progressing Goal: Will remain free from infection Outcome: Progressing Goal: Diagnostic test results will improve Outcome: Progressing Goal: Respiratory complications will improve Outcome: Progressing Goal: Cardiovascular complication will be avoided Outcome: Progressing   Problem: Activity: Goal: Risk for activity intolerance will decrease Outcome: Progressing   Problem: Nutrition: Goal: Adequate nutrition will be maintained Outcome: Progressing   Problem: Coping: Goal: Level of anxiety will decrease Outcome: Progressing   Problem: Elimination: Goal: Will not experience complications related to bowel motility Outcome: Progressing   Problem: Pain Management: Goal: General experience of comfort will improve Outcome: Progressing   Problem: Safety: Goal: Ability to remain free from injury will improve Outcome: Progressing   Problem: Skin Integrity: Goal: Risk for impaired skin integrity will decrease Outcome: Progressing   Problem: Education: Goal: Ability to describe self-care measures that may prevent or decrease complications (Diabetes Survival Skills Education) will improve Outcome: Progressing Goal: Individualized Educational Video(s) Outcome: Progressing   Problem: Coping: Goal: Ability to adjust to condition or change in health will improve Outcome: Progressing   Problem: Fluid Volume: Goal: Ability to maintain a balanced intake and output will improve Outcome: Progressing   Problem: Health  Behavior/Discharge Planning: Goal: Ability to identify and utilize available resources and services will improve Outcome: Progressing Goal: Ability to manage health-related needs will improve Outcome: Progressing   Problem: Metabolic: Goal: Ability to maintain appropriate glucose levels will improve Outcome: Progressing   Problem: Nutritional: Goal: Maintenance of adequate nutrition will improve Outcome: Progressing Goal: Progress toward achieving an optimal weight will improve Outcome: Progressing   Problem: Skin Integrity: Goal: Risk for impaired skin integrity will decrease Outcome: Progressing   Problem: Tissue Perfusion: Goal: Adequacy of tissue perfusion will improve Outcome: Progressing

## 2023-08-12 NOTE — Progress Notes (Signed)
[facility] NEPHROLOGY PROGRESS NOTE  Assessment/ Plan: Pt is a 27 y.o. yo male   Dialysis Orders:  HP TTS 4:15 500/A2x EDW 112 kg 2K/2Ca AVF Heparin [address] U Last HD 11/28 - post HD wt 146 kg  Mircera 200 q 2 weeks (last 11/19) Venofer 100 x 10  Hectorol 8 q TIW  #Profound volume overload - Chronic issue.  > 30 kg over EDW on presentation.  Received serial HD treatment, last HD yesterday with 3.3 L UF.  Still volume up, emphasized on fluid restriction. #ESRD -  HD TTS as OP via AVF. Serial HD as above. Plan for HD tomorrow if schedule allows.Marland Kitchen #Hyperkalemia - has corrected with HD #Hypertension/volume  - BP acceptable. Massive volume overload as above #Anemia  -  on ESA- continue here.  Monitor hemoglobin.  #CKD-MBD/hyperphosphatemia-currently on Velphoro, added 2 tablet with snacks as well.  Follow labs.   #Recurrent ascites -  s/p paracentesis 12/2 of 3 liters.  Further paracentesis deferred to primary team. #Nutrition - Renal diet with fluid restriction # Pain presumably due to edema and fluid overload.   CT scan of pelvis with no fracture.  Continue supportive care, started empiric steroids by primary team because of increased inflammatory marker.  Subjective: Seen and examined at the bedside.  He had dialysis yesterday with only 3.3 L UF.  No new change, ongoing chronic body pain.  Denies nausea, vomiting chest pain shortness of breath.  Eating breakfast.  Objective Vital signs in last 24 hours: Vitals:   08/11/23 2214 08/11/23 2336 08/12/23 0417 08/12/23 0735  BP: 133/71 (!) 156/77 (!) 148/75 (!) 140/88  Pulse:    87  Resp:  18 20 18   Temp:  98.2 F (36.8 C) 98.3 F (36.8 C) 98 F (36.7 C)  TempSrc:  Oral Oral Oral  SpO2:    97%  Weight:   (!) 146.4 kg   Height:       Weight change: -0.3 kg  Intake/Output Summary (Last 24 hours) at 08/12/2023 1032 Last data filed at 08/12/2023 1001 Gross per 24 hour  Intake 740 ml  Output 3420 ml  Net -2680 ml        Labs: RENAL PANEL Recent Labs  Lab 08/06/23 0316 08/06/23 1015 08/07/23 0739 08/09/23 0840 08/11/23 0721 08/12/23 0843  NA 139 138 136 137 139 135  K 4.9 4.9 4.8 4.6 5.0 4.5  CL 97* 95* 95* 94* 94* 92*  CO2 29 29 30 30 30 30   GLUCOSE 130* 147* 154* 111* 137* 330*  BUN 60* 60* 54* 67* 69* 56*  CREATININE 8.00* 8.54* 6.81* 7.94* 7.66* 6.41*  CALCIUM 9.3 9.5 9.3 8.9 9.3 9.1  MG 2.1  --   --   --   --   --   PHOS 8.7* 9.3* 8.1* 8.0* 7.4*  --   ALBUMIN 2.0* 2.3* 2.1* 2.1* 2.2*  --     Liver Function Tests: Recent Labs  Lab 08/06/23 0316 08/06/23 1015 08/07/23 0739 08/09/23 0840 08/11/23 0721  AST 29  --   --   --   --   ALT 30  --   --   --   --   ALKPHOS 245*  --   --   --   --   BILITOT 1.1  --   --   --   --   PROT 7.3  --   --   --   --   ALBUMIN 2.0*   < >  2.1* 2.1* 2.2*   < > = values in this interval not displayed.   No results for input(s): "LIPASE", "AMYLASE" in the last 168 hours. No results for input(s): "AMMONIA" in the last 168 hours. CBC: Recent Labs    04/03/23 0048 04/18/23 1700 08/06/23 1015 08/07/23 0739 08/09/23 0840 08/11/23 0721 08/12/23 0843  HGB 9.6*   < > 9.6* 8.7* 8.1* 8.5* 9.7*  MCV 84.4   < > 89.9 90.4 91.2 90.4 91.1  FERRITIN 941*  --   --   --   --   --   --   TIBC 132*  --   --   --   --   --   --   IRON 27*  --   --   --   --   --   --    < > = values in this interval not displayed.    Cardiac Enzymes: No results for input(s): "CKTOTAL", "CKMB", "CKMBINDEX", "TROPONINI" in the last 168 hours.  CBG: Recent Labs  Lab 08/11/23 1332 08/11/23 1551 08/11/23 2104 08/12/23 0606 08/12/23 0814  GLUCAP 124* 162* 153* 269* 304*    Iron Studies: No results for input(s): "IRON", "TIBC", "TRANSFERRIN", "FERRITIN" in the last 72 hours. Studies/Results: No results found.  Medications: Infusions:   Scheduled Medications:  carvedilol  25 mg Oral BID WC   Chlorhexidine Gluconate Cloth  6 each Topical Q0600    Chlorhexidine Gluconate Cloth  6 each Topical Q0600   darbepoetin (ARANESP) injection - DIALYSIS  200 mcg Subcutaneous Q Mon-1800   feeding supplement (NEPRO CARB STEADY)  237 mL Oral BID BM   gabapentin  300 mg Oral BID   heparin  5,000 Units Subcutaneous Q8H   hydrALAZINE  100 mg Oral TID   insulin aspart  0-15 Units Subcutaneous TID WC   insulin aspart  0-5 Units Subcutaneous QHS   isosorbide mononitrate  90 mg Oral Daily   loratadine  10 mg Oral Daily   polyethylene glycol  17 g Oral Daily   [START ON 08/17/2023] predniSONE  10 mg Oral Q breakfast   [START ON 08/14/2023] predniSONE  20 mg Oral Q breakfast   predniSONE  40 mg Oral Q breakfast   sucroferric oxyhydroxide  1,000 mg Oral TID WC   sucroferric oxyhydroxide  1,000 mg Oral With snacks    have reviewed scheduled and prn medications.  Physical Exam: General:NAD, comfortable Heart:RRR, s1s2 nl Lungs:clear b/l, no crackle Abdomen:soft, Non-tender, distended Extremities: Peripheral edema present/anasarca+ Dialysis Access: AVF with good thrill and bruit  Ryhanna Dunsmore Prasad Damonie Ellenwood 08/12/2023,10:32 AM  LOS: 8 days

## 2023-08-13 DIAGNOSIS — J9601 Acute respiratory failure with hypoxia: Secondary | ICD-10-CM | POA: Diagnosis not present

## 2023-08-13 DIAGNOSIS — J9602 Acute respiratory failure with hypercapnia: Secondary | ICD-10-CM | POA: Diagnosis not present

## 2023-08-13 LAB — CBC WITH DIFFERENTIAL/PLATELET
Abs Immature Granulocytes: 0.01 10*3/uL (ref 0.00–0.07)
Basophils Absolute: 0 10*3/uL (ref 0.0–0.1)
Basophils Relative: 0 %
Eosinophils Absolute: 0 10*3/uL (ref 0.0–0.5)
Eosinophils Relative: 0 %
HCT: 33.4 % — ABNORMAL LOW (ref 39.0–52.0)
Hemoglobin: 10 g/dL — ABNORMAL LOW (ref 13.0–17.0)
Immature Granulocytes: 0 %
Lymphocytes Relative: 5 %
Lymphs Abs: 0.3 10*3/uL — ABNORMAL LOW (ref 0.7–4.0)
MCH: 27.5 pg (ref 26.0–34.0)
MCHC: 29.9 g/dL — ABNORMAL LOW (ref 30.0–36.0)
MCV: 92 fL (ref 80.0–100.0)
Monocytes Absolute: 0.6 10*3/uL (ref 0.1–1.0)
Monocytes Relative: 10 %
Neutro Abs: 5.1 10*3/uL (ref 1.7–7.7)
Neutrophils Relative %: 85 %
Platelets: 393 10*3/uL (ref 150–400)
RBC: 3.63 MIL/uL — ABNORMAL LOW (ref 4.22–5.81)
RDW: 16.8 % — ABNORMAL HIGH (ref 11.5–15.5)
WBC: 6 10*3/uL (ref 4.0–10.5)
nRBC: 0 % (ref 0.0–0.2)

## 2023-08-13 LAB — GLUCOSE, CAPILLARY
Glucose-Capillary: 408 mg/dL — ABNORMAL HIGH (ref 70–99)
Glucose-Capillary: 406 mg/dL — ABNORMAL HIGH (ref 70–99)
Glucose-Capillary: 383 mg/dL — ABNORMAL HIGH (ref 70–99)
Glucose-Capillary: 275 mg/dL — ABNORMAL HIGH (ref 70–99)
Glucose-Capillary: 194 mg/dL — ABNORMAL HIGH (ref 70–99)
Glucose-Capillary: 294 mg/dL — ABNORMAL HIGH (ref 70–99)

## 2023-08-13 LAB — RENAL FUNCTION PANEL
Albumin: 2.4 g/dL — ABNORMAL LOW (ref 3.5–5.0)
Anion gap: 14 (ref 5–15)
BUN: 79 mg/dL — ABNORMAL HIGH (ref 6–20)
CO2: 31 mmol/L (ref 22–32)
Calcium: 9.7 mg/dL (ref 8.9–10.3)
Chloride: 95 mmol/L — ABNORMAL LOW (ref 98–111)
Creatinine, Ser: 7.45 mg/dL — ABNORMAL HIGH (ref 0.61–1.24)
GFR, Estimated: 9 mL/min — ABNORMAL LOW (ref >60)
Glucose, Bld: 311 mg/dL — ABNORMAL HIGH (ref 70–99)
Phosphorus: 8.3 mg/dL — ABNORMAL HIGH (ref 2.5–4.6)
Potassium: 4.6 mmol/L (ref 3.5–5.1)
Sodium: 140 mmol/L (ref 135–145)

## 2023-08-13 LAB — CBC
HCT: 32.9 % — ABNORMAL LOW (ref 39.0–52.0)
Hemoglobin: 9.7 g/dL — ABNORMAL LOW (ref 13.0–17.0)
MCH: 27.2 pg (ref 26.0–34.0)
MCHC: 29.5 g/dL — ABNORMAL LOW (ref 30.0–36.0)
MCV: 92.2 fL (ref 80.0–100.0)
Platelets: 355 10*3/uL (ref 150–400)
RBC: 3.57 MIL/uL — ABNORMAL LOW (ref 4.22–5.81)
RDW: 16.8 % — ABNORMAL HIGH (ref 11.5–15.5)
WBC: 5.5 10*3/uL (ref 4.0–10.5)
nRBC: 0 % (ref 0.0–0.2)

## 2023-08-13 LAB — BASIC METABOLIC PANEL
Anion gap: 14 (ref 5–15)
BUN: 70 mg/dL — ABNORMAL HIGH (ref 6–20)
CO2: 31 mmol/L (ref 22–32)
Calcium: 9.4 mg/dL (ref 8.9–10.3)
Chloride: 92 mmol/L — ABNORMAL LOW (ref 98–111)
Creatinine, Ser: 6.95 mg/dL — ABNORMAL HIGH (ref 0.61–1.24)
GFR, Estimated: 10 mL/min — ABNORMAL LOW (ref >60)
Glucose, Bld: 430 mg/dL — ABNORMAL HIGH (ref 70–99)
Potassium: 4.6 mmol/L (ref 3.5–5.1)
Sodium: 137 mmol/L (ref 135–145)

## 2023-08-13 MED ORDER — HEPARIN SODIUM (PORCINE) 1000 UNIT/ML DIALYSIS
1000.0000 [IU] | INTRAMUSCULAR | Status: DC | PRN
  Filled 2023-08-13: qty 1

## 2023-08-13 MED ORDER — SACUBITRIL-VALSARTAN 49-51 MG PO TABS
1.0000 | ORAL_TABLET | Freq: Two times a day (BID) | ORAL | Status: DC
  Administered 2023-08-13 – 2023-08-28 (×30): 1 via ORAL
  Filled 2023-08-13 (×32): qty 1

## 2023-08-13 MED ORDER — ALTEPLASE 2 MG IJ SOLR
2.0000 mg | Freq: Once | INTRAMUSCULAR | Status: DC | PRN
  Filled 2023-08-13: qty 2

## 2023-08-13 MED ORDER — HEPARIN SODIUM (PORCINE) 1000 UNIT/ML DIALYSIS
20.0000 [IU]/kg | INTRAMUSCULAR | Status: DC | PRN
  Filled 2023-08-13: qty 3

## 2023-08-13 MED ORDER — INSULIN ASPART 100 UNIT/ML IJ SOLN
5.0000 [IU] | INTRAMUSCULAR | Status: AC
  Administered 2023-08-13: 5 [IU] via SUBCUTANEOUS

## 2023-08-13 MED ORDER — LIDOCAINE-PRILOCAINE 2.5-2.5 % EX CREA
1.0000 | TOPICAL_CREAM | CUTANEOUS | Status: DC | PRN
  Filled 2023-08-13: qty 5

## 2023-08-13 MED ORDER — LIDOCAINE HCL (PF) 1 % IJ SOLN: 5.0000 mL | INTRAMUSCULAR | Status: DC | PRN

## 2023-08-13 MED ORDER — PENTAFLUOROPROP-TETRAFLUOROETH EX AERO: 1.0000 | INHALATION_SPRAY | CUTANEOUS | Status: DC | PRN

## 2023-08-13 MED ORDER — CHLORHEXIDINE GLUCONATE CLOTH 2 % EX PADS
6.0000 | MEDICATED_PAD | Freq: Every day | CUTANEOUS | Status: DC
  Administered 2023-08-13 – 2023-08-26 (×9): 6 via TOPICAL

## 2023-08-13 MED ORDER — ANTICOAGULANT SODIUM CITRATE 4% (200MG/5ML) IV SOLN
5.0000 mL | Status: DC | PRN
  Filled 2023-08-13: qty 5

## 2023-08-13 NOTE — Progress Notes (Signed)
[facility] Progress Note   Subjective:   Pt sleeping, awakens easily to voice. Reports mild SOB today. Denies CP, dizziness, nausea. Noted high BS today.   Objective Vitals:   08/13/23 0033 08/13/23 0546 08/13/23 0615 08/13/23 0744  BP: (!) 142/91 (!) 156/87  137/85  Pulse: 84 86  81  Resp: 18 17  16   Temp: 97.6 F (36.4 C) 97.6 F (36.4 C)  98 F (36.7 C)  TempSrc: Oral Oral  Oral  SpO2: 97% 96%  97%  Weight:   (!) 151.6 kg   Height:       Physical Exam General: Alert male in NAD Heart: RRR, no murmurs, rubs or gallops Lungs: Diminished BS in bases. No wheezing, rhonchi or rales auscultated Abdomen: Soft, moderately distended, +BS Extremities: 3+ edema bilateral lower extremities Dialysis Access: AVF + t/b  Additional Objective Labs: Basic Metabolic Panel: Recent Labs  Lab 08/07/23 0739 08/09/23 0840 08/11/23 0721 08/12/23 0843 08/13/23 0247  NA 136 137 139 135 137  K 4.8 4.6 5.0 4.5 4.6  CL 95* 94* 94* 92* 92*  CO2 30 30 30 30 31   GLUCOSE 154* 111* 137* 330* 430*  BUN 54* 67* 69* 56* 70*  CREATININE 6.81* 7.94* 7.66* 6.41* 6.95*  CALCIUM 9.3 8.9 9.3 9.1 9.4  PHOS 8.1* 8.0* 7.4*  --   --    Liver Function Tests: Recent Labs  Lab 08/07/23 0739 08/09/23 0840 08/11/23 0721  ALBUMIN 2.1* 2.1* 2.2*   No results for input(s): "LIPASE", "AMYLASE" in the last 168 hours. CBC: Recent Labs  Lab 08/07/23 0739 08/09/23 0840 08/11/23 0721 08/12/23 0843 08/13/23 0247  WBC 4.7 5.7 5.4 4.5 6.0  NEUTROABS  --   --   --  3.8 5.1  HGB 8.7* 8.1* 8.5* 9.7* 10.0*  HCT 29.2* 27.1* 28.1* 32.8* 33.4*  MCV 90.4 91.2 90.4 91.1 92.0  PLT 239 292 329 349 393   Blood Culture    Component Value Date/Time   SDES BLOOD BLOOD RIGHT ARM 08/05/2023 0236   SPECREQUEST  08/05/2023 0236    BOTTLES DRAWN AEROBIC AND ANAEROBIC Blood Culture adequate volume   CULT  08/05/2023 0236    NO GROWTH 5 DAYS Performed at [facility], [address],  [place], [place] [address]    REPTSTATUS 08/10/2023 FINAL 08/05/2023 0236    Cardiac Enzymes: No results for input(s): "CKTOTAL", "CKMB", "CKMBINDEX", "TROPONINI" in the last 168 hours. CBG: Recent Labs  Lab 08/12/23 1623 08/12/23 2057 08/13/23 0559 08/13/23 0659 08/13/23 0831  GLUCAP 267* 256* 408* 406* 383*   Iron Studies: No results for input(s): "IRON", "TIBC", "TRANSFERRIN", "FERRITIN" in the last 72 hours. @lablastinr3 @ Studies/Results: No results found. Medications:   carvedilol  25 mg Oral BID WC   Chlorhexidine Gluconate Cloth  6 each Topical Q0600   Chlorhexidine Gluconate Cloth  6 each Topical Q0600   Chlorhexidine Gluconate Cloth  6 each Topical Q0600   darbepoetin (ARANESP) injection - DIALYSIS  200 mcg Subcutaneous Q Mon-1800   feeding supplement (NEPRO CARB STEADY)  237 mL Oral BID BM   gabapentin  300 mg Oral BID   heparin  5,000 Units Subcutaneous Q8H   hydrALAZINE  100 mg Oral TID   insulin aspart  0-15 Units Subcutaneous TID WC   insulin aspart  0-5 Units Subcutaneous QHS   isosorbide mononitrate  90 mg Oral Daily   loratadine  10 mg Oral Daily   polyethylene glycol  17 g Oral Daily   [  START ON 08/17/2023] predniSONE  10 mg Oral Q breakfast   [START ON 08/14/2023] predniSONE  20 mg Oral Q breakfast   sacubitril-valsartan  1 tablet Oral BID   sucroferric oxyhydroxide  1,000 mg Oral TID WC   sucroferric oxyhydroxide  1,000 mg Oral With snacks    Dialysis Orders: [facility] TTS 4:15 500/A2x EDW 112 kg 2K/2Ca AVF Heparin 45409 U Last HD 11/28 - post HD wt 146 kg  Mircera 200 q 2 weeks (last 11/19) Venofer 100 x 10  Hectorol 8 q TIW  Assessment/Plan: #Profound volume overload - Chronic issue.  > 30 kg over EDW on presentation.  Received serial HD treatment. Remains extremely volume overloaded. Weights up if accurate. Continue volume restriction and extra HD as schedule allows.  #ESRD -  HD TTS as OP via AVF. Serial HD as above. Plan for HD today and again  tomorrow.  #Hyperkalemia - has corrected with HD #Hypertension/volume  - BP acceptable. Massive volume overload as above #Anemia  -  on ESA- continue here.  Monitor hemoglobin.   #CKD-MBD/hyperphosphatemia-Phos elevated. currently on Velphoro, added 2 tablet with snacks as well.  Follow labs.  Calcium borderline, not currently on VDRA #Recurrent ascites -  s/p paracentesis 12/2 of 3 liters.  Further paracentesis deferred to primary team. #Nutrition - Renal diet with fluid restriction # Pain presumably due to edema and fluid overload.   CT scan of pelvis with no fracture.  Continue supportive care, started empiric steroids by primary team because of increased inflammatory marker.  Rogers Blocker, PA-C 08/13/2023, 9:52 AM  [facility] Pager: [phone]

## 2023-08-13 NOTE — Progress Notes (Addendum)
PROGRESS NOTE  Bobby [name]  OZD:[id number] DOB: [birth year] DOA: 08/04/2023 PCP: [facility]   Brief Narrative: Patient is a 27 year old male with history of ESRD on dialysis on TTS schedule, morbid obesity, systolic CHF, diabetes type 1, recurrent ascites requiring paracentesis, HTN, anxiety who presented with myalgia, shortness of breath, abdominal distention, lower extremity edema.  He was found to be severely volume overloaded on presentation and on acute respiratory failure with hypoxia and hypercarbia secondary to pulmonary edema.  Lab work showed hyperkalemia.  Nephrology consulted  for serial dialysis.  Patient is from SNF.  Patient has persistent weakness in bilateral lower extremities, imagings did not support any fracture or dislocation. PT/OT consulted , recommendation is to go back to SNF.  Waiting for SNF bed.    Assessment & Plan:  Principal Problem:   Acute respiratory failure with hypoxia and hypercapnia (HCC) Active Problems:   Essential hypertension   Acute on chronic HFrEF (heart failure with reduced ejection fraction) (HCC)   Acute respiratory failure with hypoxia (HCC)   ESRD on dialysis (HCC)   Ascites   Cirrhosis (HCC)   Acute pulmonary edema (HCC)   Acute on chronic hypoxic/hypercarbic respiratory failure On oxygen at 4 to 5 L at a SNF Secondary to volume overload  Volume management as per dialysis  Acute on chronic combined systolic-diastolic CHF/pulmonary edema Echo on 10/24 showed improved EF of 55 to 60% Volume management as per dialysis.  ESRD on dialysis Dialyzed on TTS schedule Nephrology following Has AV fistula on the left upper extremity  Recurrent ascites/liver cirrhosis Unclear etiology of liver cirrhosis but could be from chronic hepatic congestion from CHF/volume overload  Needs paracentesis every 2 weeks Underwent paracentesis with removal of 3 L of fluid on 12/1  Chronic pain syndrome/myalgia/lower  extremity pain Anasarca could have contributed.  Continue muscle relaxant, pain management  Generalized weakness/inability to ambulate As per the sister,  he walked  fine /playing basketball until July after that he gradually declined.  No history of trauma, cannot lift his legs X-ray of the pelvis/knees was suspicious for  acute right superior pubic ramus fracture and mild to moderate suprapatellar effusion on the left Orthopedics consulted, CT imaging of the pelvis and left knee does not show any fracture or dislocation, but showed some moderate effusion.  Orthopedics planning to tap that knee on MondayValeta Harms), 12/9 He has elevated inflammatory markers.  Uric acid normal, started empiric steroids with taper Inflammatory disease not excluded, ANA noted to be positive in 2022. Multiple inflammatory markers were checked back in 2022, which were all negative, repeat ANA and rheumatoid factor pending Pt refused MRI of lumbar spine  Type 1 diabetes A1c of 6.2 Continue current insulin regimen.  Hypertension Continue hydralazine, Coreg, Imdur  Anxiety disorder On Atarax as needed  Suspected underlying OSA/OHS Patient says he had sleep study as an outpatient.  Waiting for delivery of the machine  Chronic normocytic anemia Likely secondary to ESRD.  Currently hemoglobin stable.  Obesity: BMI of 37.7         DVT prophylaxis:Place and maintain sequential compression device Start: 08/10/23 0846 heparin injection 5,000 Units Start: 08/04/23 1515     Code Status: Full Code  Family Communication: None at beside  Patient status:Inpatient  Patient is from :home  Anticipated discharge to: SNF     Consultants: Nephrology, orthopedics  Procedures:Dialysis  Antimicrobials:  Anti-infectives (From admission, onward)    None       Subjective:  Patient  noted to be sleepy, reports he stayed up all night till early this a.m. around 4 AM.  Reported watching football  game, and ordered food from door dashwhile watching the game.  Stated is a one-time thing and would not order outside food again.   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 Cardiovascular: S1, S2 present Respiratory: Diminished breath sounds bilaterally Abdomen: Soft, nontender, distended, bowel sounds present Musculoskeletal: bilateral pedal edema noted, AV fistula left upper extremity Skin: Normal Psychiatry: Normal mood     Data Reviewed: I have personally reviewed following labs and imaging studies  CBC: Recent Labs  Lab 08/09/23 0840 08/11/23 0721 08/12/23 0843 08/13/23 0247 08/13/23 1101  WBC 5.7 5.4 4.5 6.0 5.5  NEUTROABS  --   --  3.8 5.1  --   HGB 8.1* 8.5* 9.7* 10.0* 9.7*  HCT 27.1* 28.1* 32.8* 33.4* 32.9*  MCV 91.2 90.4 91.1 92.0 92.2  PLT 292 329 349 393 355   Basic Metabolic Panel: Recent Labs  Lab 08/07/23 0739 08/09/23 0840 08/11/23 0721 08/12/23 0843 08/13/23 0247 08/13/23 1101  Bobby 136 137 139 135 137 140  K 4.8 4.6 5.0 4.5 4.6 4.6  CL 95* 94* 94* 92* 92* 95*  CO2 30 30 30 30 31 31   GLUCOSE 154* 111* 137* 330* 430* 311*  BUN 54* 67* 69* 56* 70* 79*  CREATININE 6.81* 7.94* 7.66* 6.41* 6.95* 7.45*  CALCIUM 9.3 8.9 9.3 9.1 9.4 9.7  PHOS 8.1* 8.0* 7.4*  --   --  8.3*     Recent Results (from the past 240 hour(s))  Blood culture (routine x 2)     Status: None   Collection Time: 08/04/23 11:18 AM   Specimen:  BLOOD  Result Value Ref Range Status   Specimen Description BLOOD SITE NOT SPECIFIED  Final   Special Requests   Final    BOTTLES DRAWN AEROBIC AND ANAEROBIC Blood Culture adequate volume   Culture   Final    NO GROWTH 5 DAYS Performed at [facility], [address], [place], [place] [address]    Report Status 08/09/2023 FINAL  Final  Resp panel by RT-PCR (RSV, Flu A&B, Covid) Anterior Nasal Swab     Status: None   Collection Time: 08/04/23 11:19 AM   Specimen: Anterior Nasal Swab  Result Value Ref Range Status   SARS Coronavirus 2 by RT PCR NEGATIVE NEGATIVE Final   Influenza A by PCR NEGATIVE NEGATIVE Final   Influenza B by PCR NEGATIVE NEGATIVE Final    Comment: (NOTE) The Xpert Xpress SARS-CoV-2/FLU/RSV plus assay is intended as an aid in the diagnosis of influenza from Nasopharyngeal swab specimens and should not be used as a sole basis for treatment. Nasal washings and aspirates are  unacceptable for Xpert Xpress SARS-CoV-2/FLU/RSV testing.  Fact Sheet for Patients: BloggerCourse.com  Fact Sheet for Healthcare Providers: SeriousBroker.it  This test is not yet approved or cleared by the [place] FDA and has been authorized for detection and/or diagnosis of SARS-CoV-2 by FDA under an Emergency Use Authorization (EUA). This EUA will remain in effect (meaning this test can be used) for the duration of the COVID-19 declaration under Section 564(b)(1) of the Act, 21 U.S.C. section 360bbb-3(b)(1), unless the authorization is terminated or revoked.     Resp Syncytial Virus by PCR NEGATIVE NEGATIVE Final    Comment: (NOTE) Fact Sheet for Patients: BloggerCourse.com  Fact Sheet for Healthcare Providers: SeriousBroker.it  This test is not yet approved or cleared by the [place] FDA and has been authorized for detection and/or diagnosis of SARS-CoV-2 by FDA  under an Emergency Use Authorization (EUA). This EUA will remain in effect (meaning this test can be used) for the duration of the COVID-19 declaration under Section 564(b)(1) of the Act, 21 U.S.C. section 360bbb-3(b)(1), unless the authorization is terminated or revoked.  Performed at [facility], [address] [place], [place] [address]   Blood culture (routine x 2)     Status: None   Collection Time: 08/05/23  2:36 AM   Specimen: BLOOD  Result Value Ref Range Status   Specimen Description BLOOD BLOOD RIGHT ARM  Final   Special Requests   Final    BOTTLES DRAWN AEROBIC AND ANAEROBIC Blood Culture adequate volume   Culture   Final    NO GROWTH 5 DAYS Performed at [facility], [address], [place], [place] [address]    Report Status 08/10/2023 FINAL  Final     Radiology Studies: No results found.  Scheduled Meds:  carvedilol  25 mg Oral BID WC   Chlorhexidine Gluconate Cloth  6 each Topical Q0600   Chlorhexidine Gluconate Cloth  6 each Topical Q0600   darbepoetin (ARANESP) injection - DIALYSIS  200 mcg Subcutaneous Q Mon-1800   feeding supplement (NEPRO CARB STEADY)  237 mL Oral BID BM   gabapentin  300 mg Oral BID   heparin  5,000 Units Subcutaneous Q8H   hydrALAZINE  100 mg Oral TID   insulin aspart  0-15 Units Subcutaneous TID WC   insulin aspart  0-5 Units Subcutaneous QHS   isosorbide mononitrate  90 mg Oral Daily   loratadine  10 mg Oral Daily   polyethylene glycol  17 g Oral Daily   [START ON 08/17/2023] predniSONE  10 mg Oral Q breakfast   [START ON 08/14/2023] predniSONE  20 mg Oral Q breakfast   sacubitril-valsartan  1 tablet Oral BID   sucroferric oxyhydroxide  1,000 mg Oral TID WC   sucroferric oxyhydroxide  1,000 mg Oral With snacks   Continuous Infusions:  anticoagulant sodium citrate        LOS: 9 days   Briant Cedar, MD [facility] P12/05/2023, 3:48 PM

## 2023-08-13 NOTE — Progress Notes (Signed)
  Patient's nurse reported that patient ate food from door Dash at 2 AM.  Blood glucose is 408 this morning.  Informed bedside nurse to give insulin NovoLog 5 units now and check blood glucose again in 30 minutes.  If it is still high between 300-400  give wi give another 10 units of NovoLog from his sliding scale regimen which already is on board.  Tereasa Coop, MD [facility] 08/13/2023, 6:19 AM

## 2023-08-13 NOTE — Inpatient Diabetes Management (Addendum)
Inpatient Diabetes Program Recommendations  AACE/ADA: New Consensus Statement on Inpatient Glycemic Control (2015)  Target Ranges:  Prepandial:   less than 140 mg/dL      Peak postprandial:   less than 180 mg/dL (1-2 hours)      Critically ill patients:  140 - 180 mg/dL   Lab Results  Component Value Date   GLUCAP 383 (H) 08/13/2023   HGBA1C 6.2 (H) 08/05/2023    Review of Glycemic Control  Latest Reference Range & Units 08/12/23 16:23 08/12/23 20:57 08/13/23 05:59 08/13/23 06:59 08/13/23 08:31  Glucose-Capillary 70 - 99 mg/dL 191 (H) 478 (H) 295 (H) 406 (H) 383 (H)  (H): Data is abnormally high Diabetes history: Type 1 DM?  Outpatient Diabetes medications: novolog SSI Current orders for Inpatient glycemic control: Novolog 0-15 units TID, Novolog 0-5 units at bedtime Prednisone 20 mg QD Positive Insulin antibodies- others WNL from 04/2023 No endocrinology note noted   Inpatient Diabetes Program Recommendations:    Consider adding: -Semglee 8 units every day - In the setting of steroids: Novolog 3 units TID (Assuming patient is consuming >50% of meals)  Addendum: Spoke with patient regarding outpatient diabetes management. Patient reports not knowing "type of diabetes". Verified home medications; no longer taking basal insulin because "I was having lows and my doctor took me off".  Explained AM hyperglycemia was a result of excessive snacking without coverage.  Reviewed patient's current A1c of 6.2%. Explained what a A1c is and what it measures. Also reviewed goal A1c with patient, importance of good glucose control @ home, and blood sugar goals. Reviewed patho of DM, survival skills, interventions, differences between type 1 & 2 DM, vascular changes and commorbidities.  Patient has a meter and testing supplies. Briefly reviewed CGM, however, patient was not interested. Reports checking CBGs with meals and will correct with Novolog as needed. No further questions.   Thanks, Lujean Rave, MSN, RNC-OB Diabetes Coordinator [phone] (8a-5p)

## 2023-08-13 NOTE — Progress Notes (Signed)
Physical Therapy Treatment Patient Details Name: Bobby [name] MRN: [id number] DOB: [birth year] Today's Date: 08/13/2023   History of Present Illness Pt is 27 yo presenting to [facility] ED with diffuse muscle aches after missing HD session on Tuesday. Also reporting dyspnea on exertion, worsening abdominal swelling and leg swelling. Pt was able to get HD on Thursday with 6L removed. PMH includes recurrent ascites requiring paracentesis, ESRD (HD TTS), HFrEF, type 1 diabetes, HTN, obesity.    PT Comments  Patient resting in bed and agreeable to therapy visit after having rested and been medicated earlier. Pt c/o LE pain from swelling and gentle retrograde massage performed for pain and edema management. Pt completed supine A/AAROM exercises for bil LE's and education provided on edema management (compression, manual therapy) with importance stressed of management with MD's for kidney and heart function. Pt asking thoughtful questions regarding care and rehab; discussed benefits of Tilt bed for progressing mobilization and weight bearing. Pt motivated and eager to try based on discussion. Will continue to progress pt as able.   If plan is discharge home, recommend the following: Two people to help with walking and/or transfers;Help with stairs or ramp for entrance;A lot of help with bathing/dressing/bathroom;Assist for transportation;Assistance with cooking/housework   Can travel by private vehicle     No  Equipment Recommendations  None recommended by PT    Recommendations for Other Services       Precautions / Restrictions Precautions Precautions: Fall Precaution Comments: LE weakness, L knee pain, massive hard LE edema Restrictions Weight Bearing Restrictions: No     Mobility  Bed Mobility Overal bed mobility: Needs Assistance             General bed mobility comments: pt repositioning in bed, trendelenburg and pt able to use bil UE to pull up on headboard and boost superiorly.  able to use side rails to raise trunk up forward for pillow adjustment at back to facilitate posture EOS to eat lunch.    Transfers                        Ambulation/Gait                   Stairs             Wheelchair Mobility     Tilt Bed    Modified Rankin (Stroke Patients Only)       Balance                                            Cognition Arousal: Alert Behavior During Therapy: WFL for tasks assessed/performed Overall Cognitive Status: No family/caregiver present to determine baseline cognitive functioning                                          Exercises General Exercises - Lower Extremity Ankle Circles/Pumps: Both, Supine, 15 reps, AROM Short Arc Quad: Both, Supine, 15 reps, AROM Heel Slides: AAROM, Both, Supine, 15 reps Other Exercises Other Exercises: retrograde light manual massage bil LE's foot to knee to manage pain and edema. pt's LE's with hemosiderin stains and hardened indurated edema. Other Exercises: Both;Supine;15 reps;AROM; hip internal rotation activation    General Comments  Pertinent Vitals/Pain      Home Living                          Prior Function            PT Goals (current goals can now be found in the care plan section) Acute Rehab PT Goals Patient Stated Goal: Hopeful to get home somehow, but needs rehab now PT Goal Formulation: With patient/family Time For Goal Achievement: 08/21/23 Potential to Achieve Goals: Fair Progress towards PT goals: Progressing toward goals    Frequency    Min 1X/week      PT Plan      Co-evaluation              AM-PAC PT "6 Clicks" Mobility   Outcome Measure  Help needed turning from your back to your side while in a flat bed without using bedrails?: A Little Help needed moving from lying on your back to sitting on the side of a flat bed without using bedrails?: A Lot Help needed moving to and  from a bed to a chair (including a wheelchair)?: Total Help needed standing up from a chair using your arms (e.g., wheelchair or bedside chair)?: Total Help needed to walk in hospital room?: Total Help needed climbing 3-5 steps with a railing? : Total 6 Click Score: 9    End of Session Equipment Utilized During Treatment: Oxygen Activity Tolerance: Patient tolerated treatment well Patient left: in bed;with call bell/phone within reach Nurse Communication: Mobility status;Patient requests pain meds PT Visit Diagnosis: Muscle weakness (generalized) (M62.81);Other abnormalities of gait and mobility (R26.89)     Time: 1427-1500 PT Time Calculation (min) (ACUTE ONLY): 33 min  Charges:    $Therapeutic Exercise: 8-22 mins $Massage: 8-22 mins PT General Charges $$ ACUTE PT VISIT: 1 Visit                     Wynn Maudlin, DPT Acute Rehabilitation Services Office [phone]  08/13/23 3:23 PM

## 2023-08-13 NOTE — Progress Notes (Signed)
0617: BG 408. Dr Janalyn Shy is notified, New order: Give 5 U STAT and recheck blood glucose after 30 minutes  0623:  5 U of NovoLog is given.  8295: BG 406. Dr Janalyn Shy is notified. Per Dr Janalyn Shy give 15 units.

## 2023-08-13 NOTE — Plan of Care (Signed)
  Problem: Education: Goal: Knowledge of General Education information will improve Description: Including pain rating scale, medication(s)/side effects and non-pharmacologic comfort measures Outcome: Progressing   Problem: Health Behavior/Discharge Planning: Goal: Ability to manage health-related needs will improve Outcome: Progressing   Problem: Clinical Measurements: Goal: Ability to maintain clinical measurements within normal limits will improve Outcome: Progressing Goal: Will remain free from infection Outcome: Progressing Goal: Diagnostic test results will improve Outcome: Progressing Goal: Respiratory complications will improve Outcome: Progressing Goal: Cardiovascular complication will be avoided Outcome: Progressing   Problem: Activity: Goal: Risk for activity intolerance will decrease Outcome: Progressing   Problem: Nutrition: Goal: Adequate nutrition will be maintained Outcome: Progressing   Problem: Coping: Goal: Level of anxiety will decrease Outcome: Progressing   Problem: Elimination: Goal: Will not experience complications related to bowel motility Outcome: Progressing   Problem: Pain Management: Goal: General experience of comfort will improve Outcome: Progressing   Problem: Safety: Goal: Ability to remain free from injury will improve Outcome: Progressing   Problem: Skin Integrity: Goal: Risk for impaired skin integrity will decrease Outcome: Progressing   Problem: Education: Goal: Ability to describe self-care measures that may prevent or decrease complications (Diabetes Survival Skills Education) will improve Outcome: Progressing Goal: Individualized Educational Video(s) Outcome: Progressing   Problem: Coping: Goal: Ability to adjust to condition or change in health will improve Outcome: Progressing   Problem: Fluid Volume: Goal: Ability to maintain a balanced intake and output will improve Outcome: Progressing   Problem: Health  Behavior/Discharge Planning: Goal: Ability to identify and utilize available resources and services will improve Outcome: Progressing Goal: Ability to manage health-related needs will improve Outcome: Progressing   Problem: Metabolic: Goal: Ability to maintain appropriate glucose levels will improve Outcome: Progressing   Problem: Nutritional: Goal: Maintenance of adequate nutrition will improve Outcome: Progressing Goal: Progress toward achieving an optimal weight will improve Outcome: Progressing   Problem: Skin Integrity: Goal: Risk for impaired skin integrity will decrease Outcome: Progressing   Problem: Tissue Perfusion: Goal: Adequacy of tissue perfusion will improve Outcome: Progressing

## 2023-08-13 NOTE — TOC Progression Note (Addendum)
Transition of Care [facility]) - Progression Note    Patient Details  Name: Bobby [name] MRN: [id number] Date of Birth: [birth year]  Transition of Care [facility]) CM/SW Contact  Michaela Corner, [place] Phone Number: 08/13/2023, 10:29 AM  Clinical Narrative:   CSW left VM for pts sister to receive medicare information update.   10:37AM: CSW secure messaged financial counseling to verify medicare and updated pts chart if applicable.    Expected Discharge Plan: Skilled Nursing Facility Barriers to Discharge: Continued Medical Work up, SNF Pending bed offer  Expected Discharge Plan and Services In-house Referral: Clinical Social Work     Living arrangements for the past 2 months: Skilled Nursing Facility                                       Social Determinants of Health (SDOH) Interventions SDOH Screenings   Food Insecurity: No Food Insecurity (08/05/2023)  Recent Concern: Food Insecurity - High Risk (05/29/2023)   Received from [facility]  Housing: Low Risk  (08/05/2023)  Transportation Needs: No Transportation Needs (08/05/2023)  Recent Concern: Transportation Needs - Unmet Transportation Needs (07/10/2023)   Received from [facility]  Utilities: Not At Risk (08/05/2023)  Recent Concern: Utilities - Medium Risk (05/29/2023)   Received from [facility]  Financial Resource Strain: Low Risk  (07/20/2022)   [facility] visits prior to 11/04/2022., [facility], [facility] visits prior to 11/04/2022., [facility]  Physical Activity: Insufficiently Active (07/20/2022)   Received from [facility], [facility] visits prior to 11/04/2022., [facility] visits prior to 11/04/2022., [facility]  Social Connections: Unknown (07/20/2022)   Received from [facility] visits prior to 11/04/2022., [facility] visits prior to 11/04/2022.  Stress:  No Stress Concern Present (07/20/2022)   [facility] from [facility], [facility] visits prior to 11/04/2022., [facility] visits prior to 11/04/2022., [facility]  Tobacco Use: Medium Risk (08/04/2023)    Readmission Risk Interventions    04/19/2023    3:26 PM 03/11/2021    3:57 PM  Readmission Risk Prevention Plan  Transportation Screening Complete Complete  PCP or Specialist Appt within 5-7 Days Complete Not Complete  Not Complete comments  03/29/21 is first available appt  Home Care Screening Complete Complete  Medication Review (RN CM) Referral to Pharmacy

## 2023-08-13 NOTE — Plan of Care (Signed)
  Problem: Education: Goal: Knowledge of General Education information will improve Description: Including pain rating scale, medication(s)/side effects and non-pharmacologic comfort measures Outcome: Progressing   Problem: Clinical Measurements: Goal: Will remain free from infection Outcome: Progressing   Problem: Nutrition: Goal: Adequate nutrition will be maintained Outcome: Progressing   

## 2023-08-14 DIAGNOSIS — J9602 Acute respiratory failure with hypercapnia: Secondary | ICD-10-CM | POA: Diagnosis not present

## 2023-08-14 DIAGNOSIS — J9601 Acute respiratory failure with hypoxia: Secondary | ICD-10-CM | POA: Diagnosis not present

## 2023-08-14 LAB — RENAL FUNCTION PANEL
Albumin: 2.4 g/dL — ABNORMAL LOW (ref 3.5–5.0)
Anion gap: 12 (ref 5–15)
BUN: 66 mg/dL — ABNORMAL HIGH (ref 6–20)
CO2: 33 mmol/L — ABNORMAL HIGH (ref 22–32)
Calcium: 9.4 mg/dL (ref 8.9–10.3)
Chloride: 90 mmol/L — ABNORMAL LOW (ref 98–111)
Creatinine, Ser: 5.72 mg/dL — ABNORMAL HIGH (ref 0.61–1.24)
GFR, Estimated: 13 mL/min — ABNORMAL LOW
Glucose, Bld: 343 mg/dL — ABNORMAL HIGH (ref 70–99)
Phosphorus: 6.2 mg/dL — ABNORMAL HIGH (ref 2.5–4.6)
Potassium: 4.2 mmol/L (ref 3.5–5.1)
Sodium: 135 mmol/L (ref 135–145)

## 2023-08-14 LAB — GLUCOSE, CAPILLARY
Glucose-Capillary: 297 mg/dL — ABNORMAL HIGH (ref 70–99)
Glucose-Capillary: 303 mg/dL — ABNORMAL HIGH (ref 70–99)
Glucose-Capillary: 210 mg/dL — ABNORMAL HIGH (ref 70–99)
Glucose-Capillary: 209 mg/dL — ABNORMAL HIGH (ref 70–99)

## 2023-08-14 LAB — CBC WITH DIFFERENTIAL/PLATELET
Abs Immature Granulocytes: 0.03 10*3/uL (ref 0.00–0.07)
Basophils Absolute: 0 10*3/uL (ref 0.0–0.1)
Basophils Relative: 0 %
Eosinophils Absolute: 0 10*3/uL (ref 0.0–0.5)
Eosinophils Relative: 1 %
HCT: 32.3 % — ABNORMAL LOW (ref 39.0–52.0)
Hemoglobin: 9.6 g/dL — ABNORMAL LOW (ref 13.0–17.0)
Immature Granulocytes: 1 %
Lymphocytes Relative: 5 %
Lymphs Abs: 0.3 10*3/uL — ABNORMAL LOW (ref 0.7–4.0)
MCH: 27.2 pg (ref 26.0–34.0)
MCHC: 29.7 g/dL — ABNORMAL LOW (ref 30.0–36.0)
MCV: 91.5 fL (ref 80.0–100.0)
Monocytes Absolute: 0.4 10*3/uL (ref 0.1–1.0)
Monocytes Relative: 7 %
Neutro Abs: 5.3 10*3/uL (ref 1.7–7.7)
Neutrophils Relative %: 86 %
Platelets: 385 10*3/uL (ref 150–400)
RBC: 3.53 MIL/uL — ABNORMAL LOW (ref 4.22–5.81)
RDW: 17 % — ABNORMAL HIGH (ref 11.5–15.5)
WBC: 6.1 10*3/uL (ref 4.0–10.5)
nRBC: 0 % (ref 0.0–0.2)

## 2023-08-14 MED ORDER — METHYLPREDNISOLONE ACETATE 40 MG/ML IJ SUSP
40.0000 mg | Freq: Once | INTRAMUSCULAR | Status: AC
  Administered 2023-08-15: 40 mg via INTRA_ARTICULAR
  Filled 2023-08-14 (×2): qty 1

## 2023-08-14 MED ORDER — INSULIN ASPART 100 UNIT/ML IJ SOLN
3.0000 [IU] | Freq: Three times a day (TID) | INTRAMUSCULAR | Status: DC
  Administered 2023-08-14 – 2023-08-28 (×21): 3 [IU] via SUBCUTANEOUS

## 2023-08-14 MED ORDER — HYDROMORPHONE HCL 1 MG/ML IJ SOLN: 0.5000 mg | Freq: Once | INTRAMUSCULAR | Status: DC

## 2023-08-14 MED ORDER — BUPIVACAINE HCL (PF) 0.5 % IJ SOLN
10.0000 mL | Freq: Once | INTRAMUSCULAR | Status: DC
  Filled 2023-08-14 (×2): qty 10

## 2023-08-14 MED ORDER — INSULIN GLARGINE-YFGN 100 UNIT/ML ~~LOC~~ SOLN
5.0000 [IU] | Freq: Every day | SUBCUTANEOUS | Status: DC
  Administered 2023-08-14 – 2023-08-28 (×13): 5 [IU] via SUBCUTANEOUS
  Filled 2023-08-14 (×15): qty 0.05

## 2023-08-14 NOTE — Progress Notes (Signed)
PROGRESS NOTE  Tre Barcellona  WUJ:[id number] DOB: [birth year] DOA: 08/04/2023 PCP: [facility]   Brief Narrative: Patient is a 27 year old male with history of ESRD on dialysis on TTS schedule, morbid obesity, systolic CHF, diabetes type 1, recurrent ascites requiring paracentesis, HTN, anxiety who presented with myalgia, shortness of breath, abdominal distention, lower extremity edema.  He was found to be severely volume overloaded on presentation and on acute respiratory failure with hypoxia and hypercarbia secondary to pulmonary edema.  Lab work showed hyperkalemia.  Nephrology consulted  for serial dialysis.  Patient is from SNF.  Patient has persistent weakness in bilateral lower extremities, imagings did not support any fracture or dislocation. PT/OT consulted , recommendation is to go back to SNF.  Waiting for SNF bed.    Assessment & Plan:  Principal Problem:   Acute respiratory failure with hypoxia and hypercapnia (HCC) Active Problems:   Essential hypertension   Acute on chronic HFrEF (heart failure with reduced ejection fraction) (HCC)   Acute respiratory failure with hypoxia (HCC)   ESRD on dialysis (HCC)   Ascites   Cirrhosis (HCC)   Acute pulmonary edema (HCC)   Acute on chronic hypoxic/hypercarbic respiratory failure On oxygen at 4 to 5 L at a SNF, now on 2 L O2 saturating well Secondary to volume overload  Volume management as per dialysis  Acute on chronic combined systolic-diastolic CHF/pulmonary edema Echo on 10/24 showed improved EF of 55 to 60% Volume management as per dialysis.  ESRD on dialysis Dialyzed on TTS schedule Nephrology following Has AV fistula on the left upper extremity  Recurrent ascites/liver cirrhosis Unclear etiology of liver cirrhosis but could be from chronic hepatic congestion from CHF/volume overload  Needs paracentesis every 2 weeks Underwent paracentesis with removal of 3 L of fluid on 12/1  Chronic pain  syndrome/myalgia/lower extremity pain Anasarca could have contributed.  Continue muscle relaxant, pain management  Generalized weakness/inability to ambulate As per the sister,  he walked  fine /playing basketball until July after that he gradually declined.  No history of trauma, cannot lift his legs X-ray of the pelvis/knees was suspicious for  acute right superior pubic ramus fracture and mild to moderate suprapatellar effusion on the left Orthopedics consulted, CT imaging of the pelvis and left knee does not show any fracture or dislocation, but showed some moderate effusion.  Orthopedics planning to tap knee on 12/11 Valeta Harms), but may need IR arthrocentesis  He has elevated inflammatory markers.  Uric acid normal, started empiric steroids with taper Inflammatory disease not excluded, ANA noted to be positive in 2022. Multiple inflammatory markers were checked back in 2022, which were all negative, repeat ANA and rheumatoid factor pending Pt refused MRI of lumbar spine even with sedation  Type 1 diabetes A1c of 6.2 Continue ssi, Accu-Cheks, adjust accordingly  Hypertension Continue hydralazine, Coreg, Imdur  Anxiety disorder On Atarax as needed  Suspected underlying OSA/OHS Patient says he had sleep study as an outpatient.  Waiting for delivery of the machine  Chronic normocytic anemia Likely secondary to ESRD.  Currently hemoglobin stable.  Obesity: BMI of 37.7         DVT prophylaxis:Place and maintain sequential compression device Start: 08/10/23 0846 heparin injection 5,000 Units Start: 08/04/23 1515     Code Status: Full Code  Family Communication: None at beside  Patient status:Inpatient  Patient is from :home  Anticipated discharge to: SNF     Consultants: Nephrology, orthopedics  Procedures:Dialysis  Antimicrobials:  Anti-infectives (  From admission, onward)    None       Subjective:  Patient continues to deny any new complaints  except for lower extremity pain.  Discussed extensively with patient about getting MRI even with sedation, patient reports pain to claustrophobic and continues to decline.   Objective: Vitals:   08/14/23 0745 08/14/23 1117 08/14/23 1620 08/14/23 1900  BP: (!) 153/80 (!) 140/78 134/66 125/68  Pulse: 80 87 82 86  Resp: 18 18 16 16   Temp: 97.6 F (36.4 C) 97.6 F (36.4 C) (!) 97.5 F (36.4 C) 97.6 F (36.4 C)  TempSrc: Oral Oral Oral Oral  SpO2: 98% 96% 90% 97%  Weight:      Height:        Intake/Output Summary (Last 24 hours) at 08/14/2023 1947 Last data filed at 08/14/2023 1840 Gross per 24 hour  Intake 1557 ml  Output 3000 ml  Net -1443 ml   Filed Weights   08/12/23 0417 08/13/23 0615 08/14/23 0522  Weight: (!) 146.4 kg (!) 151.6 kg (!) 151.2 kg    Examination:  General: NAD, 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 Cardiovascular: S1, S2 present Respiratory: Diminished breath sounds bilaterally Abdomen: Soft, nontender, distended, bowel sounds present Musculoskeletal: bilateral pedal edema noted, AV fistula left upper extremity Skin: Normal Psychiatry: Normal mood     Data Reviewed: I have personally reviewed following labs and imaging studies  CBC: Recent Labs  Lab 08/11/23 0721 08/12/23 0843 08/13/23 0247 08/13/23 1101 08/14/23 0741  WBC 5.4 4.5 6.0 5.5 6.1  NEUTROABS  --  3.8 5.1  --  5.3  HGB 8.5* 9.7* 10.0* 9.7* 9.6*  HCT 28.1* 32.8* 33.4* 32.9* 32.3*  MCV 90.4 91.1 92.0 92.2 91.5  PLT 329 349 393 355 385   Basic Metabolic Panel: Recent Labs  Lab 08/09/23 0840 08/11/23 0721 08/12/23 0843 08/13/23 0247 08/13/23 1101 08/14/23 0741  NA 137 139 135 137 140 135  K 4.6 5.0 4.5 4.6 4.6 4.2  CL 94* 94* 92* 92* 95* 90*  CO2 30 30 30 31 31  33*  GLUCOSE 111* 137* 330* 430* 311* 343*  BUN 67* 69* 56* 70* 79* 66*  CREATININE 7.94* 7.66* 6.41* 6.95* 7.45* 5.72*  CALCIUM 8.9 9.3 9.1 9.4 9.7 9.4  PHOS 8.0* 7.4*  --   --  8.3* 6.2*     Recent Results (from the past 240  hour(s))  Blood culture (routine x 2)     Status: None   Collection Time: 08/05/23  2:36 AM   Specimen: BLOOD  Result Value Ref Range Status   Specimen Description BLOOD BLOOD RIGHT ARM  Final   Special Requests   Final    BOTTLES DRAWN AEROBIC AND ANAEROBIC Blood Culture adequate volume   Culture   Final    NO GROWTH 5 DAYS Performed at [facility], [address], [place], [place] [address]    Report Status 08/10/2023 FINAL  Final     Radiology Studies: No results found.  Scheduled Meds:  bupivacaine(PF)  10 mL Infiltration Once   carvedilol  25 mg Oral BID WC   Chlorhexidine Gluconate Cloth  6 each Topical Q0600   Chlorhexidine Gluconate Cloth  6 each Topical Q0600   darbepoetin (ARANESP) injection - DIALYSIS  200 mcg Subcutaneous Q Mon-1800   feeding supplement (NEPRO CARB STEADY)  237 mL Oral BID BM   gabapentin  300 mg Oral BID   heparin  5,000 Units Subcutaneous Q8H   hydrALAZINE  100 mg Oral  TID    HYDROmorphone (DILAUDID) injection  0.5 mg Intravenous Once   insulin aspart  0-15 Units Subcutaneous TID WC   insulin aspart  0-5 Units Subcutaneous QHS   insulin aspart  3 Units Subcutaneous TID WC   insulin glargine-yfgn  5 Units Subcutaneous Daily   isosorbide mononitrate  90 mg Oral Daily   loratadine  10 mg Oral Daily   methylPREDNISolone acetate  40 mg Intra-articular Once   polyethylene glycol  17 g Oral Daily   [START ON 08/17/2023] predniSONE  10 mg Oral Q breakfast   predniSONE  20 mg Oral Q breakfast   sacubitril-valsartan  1 tablet Oral BID   sucroferric oxyhydroxide  1,000 mg Oral TID WC   sucroferric oxyhydroxide  1,000 mg Oral With snacks   Continuous Infusions:      LOS: 10 days   Briant Cedar, MD [facility] P12/06/2023, 7:47 PM

## 2023-08-14 NOTE — Plan of Care (Signed)
  Problem: Activity: Goal: Risk for activity intolerance will decrease Outcome: Not Progressing Note: Declined mobility x 3 today 08/14/23   Problem: Education: Goal: Knowledge of General Education information will improve Description: Including pain rating scale, medication(s)/side effects and non-pharmacologic comfort measures Outcome: Progressing   Problem: Health Behavior/Discharge Planning: Goal: Ability to manage health-related needs will improve Outcome: Progressing   Problem: Clinical Measurements: Goal: Ability to maintain clinical measurements within normal limits will improve Outcome: Progressing Goal: Will remain free from infection Outcome: Progressing Goal: Diagnostic test results will improve Outcome: Progressing Goal: Respiratory complications will improve Outcome: Progressing Goal: Cardiovascular complication will be avoided Outcome: Progressing   Problem: Nutrition: Goal: Adequate nutrition will be maintained Outcome: Progressing   Problem: Coping: Goal: Level of anxiety will decrease Outcome: Progressing   Problem: Elimination: Goal: Will not experience complications related to bowel motility Outcome: Progressing   Problem: Pain Management: Goal: General experience of comfort will improve Outcome: Progressing   Problem: Safety: Goal: Ability to remain free from injury will improve Outcome: Progressing   Problem: Skin Integrity: Goal: Risk for impaired skin integrity will decrease Outcome: Progressing   Problem: Metabolic: Goal: Ability to maintain appropriate glucose levels will improve Outcome: Progressing   Problem: Nutritional: Goal: Maintenance of adequate nutrition will improve Outcome: Progressing   Problem: Skin Integrity: Goal: Risk for impaired skin integrity will decrease Outcome: Progressing   Problem: Tissue Perfusion: Goal: Adequacy of tissue perfusion will improve Outcome: Progressing

## 2023-08-14 NOTE — Progress Notes (Signed)
Per HD, 3L removed with no issues.

## 2023-08-14 NOTE — TOC Progression Note (Signed)
Transition of Care [facility]) - Progression Note    Patient Details  Name: Bobby [name] MRN: [id number] Date of Birth: 12/18/1995  [facility] CM/SW Contact  Michaela Corner, [place] Phone Number: 08/14/2023, 3:12 PM  Clinical Narrative:   Sonny Dandy - SNF hass declined pt at this time.   CSW called pts sister to update on SNF declining pt at this time. Sister picked up the phone but hung up twice. CSW will attempt to call at a later time.     Expected Discharge Plan: Skilled Nursing Facility Barriers to Discharge: Continued Medical Work up, SNF Pending bed offer  Expected Discharge Plan and Services In-house Referral: Clinical Social Work     Living arrangements for the past 2 months: Skilled Nursing Facility                                       Social Determinants of Health (SDOH) Interventions SDOH Screenings   Food Insecurity: No Food Insecurity (08/05/2023)  Recent Concern: Food Insecurity - High Risk (05/29/2023)   Received from [facility]  Housing: Low Risk  (08/05/2023)  Transportation Needs: No Transportation Needs (08/05/2023)  Recent Concern: Transportation Needs - Unmet Transportation Needs (07/10/2023)   Received from [facility]  Utilities: Not At Risk (08/05/2023)  Recent Concern: Utilities - Medium Risk (05/29/2023)   Received from [facility]  Financial Resource Strain: Low Risk  (07/20/2022)   [facility] visits prior to 11/04/2022., [facility], [facility] visits prior to 11/04/2022., [facility]  Physical Activity: Insufficiently Active (07/20/2022)   Received from [facility], [facility] visits prior to 11/04/2022., [facility] visits prior to 11/04/2022., [facility]  Social Connections: Unknown (07/20/2022)   [facility] from [facility] visits prior to 11/04/2022., [facility] visits  prior to 11/04/2022.  Stress: No Stress Concern Present (07/20/2022)   [facility] from [facility], [facility] visits prior to 11/04/2022., [facility] visits prior to 11/04/2022., [facility]  Tobacco Use: Medium Risk (08/04/2023)    Readmission Risk Interventions    04/19/2023    3:26 PM 03/11/2021    3:57 PM  Readmission Risk Prevention Plan  Transportation Screening Complete Complete  PCP or Specialist Appt within 5-7 Days Complete Not Complete  Not Complete comments  03/29/21 is first available appt  Home Care Screening Complete Complete  Medication Review (RN CM) Referral to Pharmacy

## 2023-08-14 NOTE — Progress Notes (Signed)
Occupational Therapy Treatment Patient Details Name: Bobby [name] MRN: [id number] DOB: [birth year] Today's Date: 08/14/2023   History of present illness Pt is 27 yo presenting to [facility] ED with diffuse muscle aches after missing HD session on Tuesday. Also reporting dyspnea on exertion, worsening abdominal swelling and leg swelling. Pt was able to get HD on Thursday with 6L removed. PMH includes recurrent ascites requiring paracentesis, ESRD (HD TTS), HFrEF, type 1 diabetes, HTN, obesity.   OT comments  Pt agreeable to bed level session but deferred OOB/EOB/tilt bed this date secondary to fatigue after dialysis. Pt benefiting from continued education regarding importance of mobility in healing. Participating in BUE HEP with cues for technique and self pacing as pt with increased WOB during exercise. Will continue to follow. Patient will benefit from continued inpatient follow up therapy, <3 hours/day       If plan is discharge home, recommend the following:  Two people to help with walking and/or transfers;Two people to help with bathing/dressing/bathroom;Assistance with cooking/housework;Assist for transportation;Help with stairs or ramp for entrance   Equipment Recommendations  Other (comment) (defer)    Recommendations for Other Services      Precautions / Restrictions Precautions Precautions: Fall Precaution Comments: LE weakness, L knee pain, massive hard LE edema Restrictions Weight Bearing Restrictions: No       Mobility Bed Mobility                    Transfers                         Balance                                           ADL either performed or assessed with clinical judgement   ADL Overall ADL's : Needs assistance/impaired Eating/Feeding: Set up;Sitting Eating/Feeding Details (indicate cue type and reason): chair position in bed at end of session with pt self feeding                                    General ADL Comments: focus session on UB HEP secondary to pt refusing EOB or OOB as he is tired after dialysis today    Extremity/Trunk Assessment Upper Extremity Assessment Upper Extremity Assessment: Generalized weakness   Lower Extremity Assessment Lower Extremity Assessment: Defer to PT evaluation        Vision   Vision Assessment?: No apparent visual deficits   Perception Perception Perception: Not tested   Praxis Praxis Praxis: Not tested    Cognition Arousal: Alert Behavior During Therapy: [facility] for tasks assessed/performed Overall Cognitive Status: No family/caregiver present to determine baseline cognitive functioning                                 General Comments: poor insight into return to mobility and needing encouragement to participate constantly reporting " am weak"        Exercises Exercises: Other exercises General Exercises - Upper Extremity Shoulder ABduction: AROM, Both, 10 reps, Supine, Theraband, Limitations Elbow Flexion: AROM, Both, 10 reps, Supine, Theraband (3 sets) Theraband Level (Elbow Flexion): Level 2 (Red) Other Exercises Other Exercises: scapular row BUE with red theraband and heavy cueing for technique 3x  10 reps each set. Other Exercises: composite flexion/extension with therapy squeeze ball Other Exercises: chair pull ups with pulling back off bed with BUE on bed rails.    Shoulder Instructions       General Comments      Pertinent Vitals/ Pain       Pain Assessment Pain Assessment: Faces Faces Pain Scale: Hurts little more Pain Location: L knee and bilat LEs Pain Descriptors / Indicators: Discomfort, Tightness, Aching Pain Intervention(s): Limited activity within patient's tolerance, Monitored during session  Home Living                                          Prior Functioning/Environment              Frequency  Min 1X/week        Progress Toward Goals  OT Goals(current  goals can now be found in the care plan section)  Progress towards OT goals: Progressing toward goals  Acute Rehab OT Goals Patient Stated Goal: eat OT Goal Formulation: With patient Time For Goal Achievement: 08/22/23 Potential to Achieve Goals: Fair ADL Goals Pt Will Perform Lower Body Dressing: with min assist;sitting/lateral leans Pt Will Transfer to Toilet: with max assist;with +2 assist;stand pivot transfer;with transfer board Pt/caregiver will Perform Home Exercise Program: Both right and left upper extremity;With written HEP provided  Plan      Co-evaluation                 AM-PAC OT "6 Clicks" Daily Activity     Outcome Measure   Help from another person eating meals?: None Help from another person taking care of personal grooming?: A Little Help from another person toileting, which includes using toliet, bedpan, or urinal?: Total Help from another person bathing (including washing, rinsing, drying)?: A Lot Help from another person to put on and taking off regular upper body clothing?: A Little Help from another person to put on and taking off regular lower body clothing?: Total 6 Click Score: 14    End of Session Equipment Utilized During Treatment: Other (comment) (theraband)  OT Visit Diagnosis: Unsteadiness on feet (R26.81);Muscle weakness (generalized) (M62.81);Other abnormalities of gait and mobility (R26.89);Pain   Activity Tolerance Patient tolerated treatment well   Patient Left in bed;with call bell/phone within reach;with bed alarm set   Nurse Communication Mobility status (asking for nutrition shake)        Time: 1610-9604 OT Time Calculation (min): 13 min  Charges: OT General Charges $OT Visit: 1 Visit OT Treatments $Therapeutic Exercise: 8-22 mins  Tyler Deis, OTR/L [facility] Office: [phone]   Myrla Halsted 08/14/2023, 5:16 PM

## 2023-08-14 NOTE — Progress Notes (Signed)
  Patient has history of chronic pain syndrome, myalgia and chronic lower extremity pain He is complaining of left-sided lower leg pain.  Earlier today he got Dilaudid 0.5 mg but reported that the IV has been dislodged and he really did not got the Dilaudid.  Currently complaining about left-sided leg pain 9 out of 10 intensity.  Giving 1 dose of Dilaudid 0.5 mg

## 2023-08-14 NOTE — Progress Notes (Signed)
[facility] Progress Note   Subjective:   Had HD yesterday with 3L UF. Reports his breathing is about the same. Denies SOB, CP, dizziness, nausea.   Objective Vitals:   08/14/23 0433 08/14/23 0448 08/14/23 0522 08/14/23 0745  BP: (!) 142/74 (!) 147/84  (!) 153/80  Pulse: 82 81  80  Resp: (!) 6 19  18   Temp:  97.6 F (36.4 C)  97.6 F (36.4 C)  TempSrc:  Oral  Oral  SpO2: 100% 100%  98%  Weight:   (!) 151.2 kg   Height:       Physical Exam General: Alert male in NAD Heart: RRR, no murmurs, rubs or gallops Lungs: Diminished BS in bases. No wheezing, rhonchi or rales auscultated Abdomen: Soft, moderately distended, +BS Extremities: 3+ edema bilateral lower extremities Dialysis Access: AVF + t/b  Additional Objective Labs: Basic Metabolic Panel: Recent Labs  Lab 08/11/23 0721 08/12/23 0843 08/13/23 0247 08/13/23 1101 08/14/23 0741  NA 139   < > 137 140 135  K 5.0   < > 4.6 4.6 4.2  CL 94*   < > 92* 95* 90*  CO2 30   < > 31 31 33*  GLUCOSE 137*   < > 430* 311* 343*  BUN 69*   < > 70* 79* 66*  CREATININE 7.66*   < > 6.95* 7.45* 5.72*  CALCIUM 9.3   < > 9.4 9.7 9.4  PHOS 7.4*  --   --  8.3* 6.2*   < > = values in this interval not displayed.   Liver Function Tests: Recent Labs  Lab 08/11/23 0721 08/13/23 1101 08/14/23 0741  ALBUMIN 2.2* 2.4* 2.4*   No results for input(s): "LIPASE", "AMYLASE" in the last 168 hours. CBC: Recent Labs  Lab 08/11/23 0721 08/12/23 0843 08/13/23 0247 08/13/23 1101 08/14/23 0741  WBC 5.4 4.5 6.0 5.5 6.1  NEUTROABS  --  3.8 5.1  --  5.3  HGB 8.5* 9.7* 10.0* 9.7* 9.6*  HCT 28.1* 32.8* 33.4* 32.9* 32.3*  MCV 90.4 91.1 92.0 92.2 91.5  PLT 329 349 393 355 385   Blood Culture    Component Value Date/Time   SDES BLOOD BLOOD RIGHT ARM 08/05/2023 0236   SPECREQUEST  08/05/2023 0236    BOTTLES DRAWN AEROBIC AND ANAEROBIC Blood Culture adequate volume   CULT  08/05/2023 0236    NO GROWTH 5 DAYS Performed at  [facility], [address] [place], [place] [address]    REPTSTATUS 08/10/2023 FINAL 08/05/2023 0236    Cardiac Enzymes: No results for input(s): "CKTOTAL", "CKMB", "CKMBINDEX", "TROPONINI" in the last 168 hours. CBG: Recent Labs  Lab 08/13/23 0831 08/13/23 1115 08/13/23 1541 08/13/23 2110 08/14/23 0520  GLUCAP 383* 275* 194* 294* 297*   Iron Studies: No results for input(s): "IRON", "TIBC", "TRANSFERRIN", "FERRITIN" in the last 72 hours. @lablastinr3 @ Studies/Results: No results found. Medications:   carvedilol  25 mg Oral BID WC   Chlorhexidine Gluconate Cloth  6 each Topical Q0600   Chlorhexidine Gluconate Cloth  6 each Topical Q0600   darbepoetin (ARANESP) injection - DIALYSIS  200 mcg Subcutaneous Q Mon-1800   feeding supplement (NEPRO CARB STEADY)  237 mL Oral BID BM   gabapentin  300 mg Oral BID   heparin  5,000 Units Subcutaneous Q8H   hydrALAZINE  100 mg Oral TID    HYDROmorphone (DILAUDID) injection  0.5 mg Intravenous Once   insulin aspart  0-15 Units Subcutaneous TID WC   insulin  aspart  0-5 Units Subcutaneous QHS   isosorbide mononitrate  90 mg Oral Daily   loratadine  10 mg Oral Daily   polyethylene glycol  17 g Oral Daily   [START ON 08/17/2023] predniSONE  10 mg Oral Q breakfast   predniSONE  20 mg Oral Q breakfast   sacubitril-valsartan  1 tablet Oral BID   sucroferric oxyhydroxide  1,000 mg Oral TID WC   sucroferric oxyhydroxide  1,000 mg Oral With snacks    Dialysis Orders: HP TTS 4:15 500/A2x EDW 112 kg 2K/2Ca AVF Heparin 84166 U Last HD 11/28 - post HD wt 146 kg  Mircera 200 q 2 weeks (last 11/19) Venofer 100 x 10  Hectorol 8 q TIW    Assessment/Plan: #Profound volume overload - Chronic issue.  > 30 kg over EDW on presentation.  Received serial HD treatment. Remains extremely volume overloaded. Weights up if accurate. Continue volume restriction (reinforced again today) and extra HD as schedule allows.  #ESRD -  HD TTS as OP via AVF.  Serial HD as above. Had HD early this AM, will plan for HD again tomorrow #Hyperkalemia - has corrected with HD #Hypertension/volume  - BP acceptable. Massive volume overload as above #Anemia  -  on ESA- continue here.  Hgb 9.6. Likely a component of hemodilution too.  #CKD-MBD/hyperphosphatemia-Phos elevated. currently on Velphoro, added 2 tablet with snacks as well.  Follow labs.  Calcium borderline, not currently on VDRA #Recurrent ascites -  s/p paracentesis 12/2 of 3 liters.  Further paracentesis deferred to primary team. #Nutrition - Renal diet with fluid restriction # Pain presumably due to edema and fluid overload.   CT scan of pelvis with no fracture.  Continue supportive care, started empiric steroids by primary team because of increased inflammatory marker.    Rogers Blocker, PA-C 08/14/2023, 11:02 AM  [facility] Pager: [phone]

## 2023-08-14 NOTE — TOC Progression Note (Addendum)
Transition of Care [facility]) - Progression Note    Patient Details  Name: Bobby [name] MRN: [id number] Date of Birth: [birth year]  Transition of Care [facility]) CM/SW Contact  Michaela Corner, [place] Phone Number: 08/14/2023, 10:09 AM  Clinical Narrative:   CSW sent referral to [facility] to see if they will be willing to accept pt for SNF.   [facility] liaison contacted CSW to say the following:  She will let clinical team review referral and get back to CSW with a decision. Pt will need a bariatric bed as he is over 6 ft tall, the facility will have to order a new bed and that is not always delivered on the same day. Pt will need an air mattress due to wounds ( facility will order this as well).  [place].   CSW contacted Andree Elk about potentially changing dialysis center. At this time, until Sonny Dandy has given a decision - dialysis center will remain the same.  Expected Discharge Plan: Skilled Nursing Facility Barriers to Discharge: Continued Medical Work up, SNF Pending bed offer  Expected Discharge Plan and Services In-house Referral: Clinical Social Work     Living arrangements for the past 2 months: Skilled Nursing Facility                                       Social Determinants of Health (SDOH) Interventions SDOH Screenings   Food Insecurity: No Food Insecurity (08/05/2023)  Recent Concern: Food Insecurity - High Risk (05/29/2023)   Received from [facility]  Housing: Low Risk  (08/05/2023)  Transportation Needs: No Transportation Needs (08/05/2023)  Recent Concern: Transportation Needs - Unmet Transportation Needs (07/10/2023)   Received from [facility]  Utilities: Not At Risk (08/05/2023)  Recent Concern: Utilities - Medium Risk (05/29/2023)   Received from [facility]  Financial Resource Strain: Low Risk  (07/20/2022)   [facility] visits prior to  11/04/2022., [facility], [facility] visits prior to 11/04/2022., [facility]  Physical Activity: Insufficiently Active (07/20/2022)   [facility], [facility] visits prior to 11/04/2022., [facility] visits prior to 11/04/2022., [facility]  Social Connections: Unknown (07/20/2022)   [facility] visits prior to 11/04/2022., [facility] visits prior to 11/04/2022.  Stress: No Stress Concern Present (07/20/2022)   [facility], [facility] visits prior to 11/04/2022., [facility] visits prior to 11/04/2022., [facility]  Tobacco Use: Medium Risk (08/04/2023)    Readmission Risk Interventions    04/19/2023    3:26 PM 03/11/2021    3:57 PM  Readmission Risk Prevention Plan  Transportation Screening Complete Complete  PCP or Specialist Appt within 5-7 Days Complete Not Complete  Not Complete comments  03/29/21 is first available appt  Home Care Screening Complete Complete  Medication Review (RN CM) Referral to Pharmacy

## 2023-08-14 NOTE — Progress Notes (Signed)
Received patient in bed to unit.  Alert and oriented.  Informed consent signed and in chart.   TX duration: 3:30  Patient tolerated well.  Transported back to the room  Alert, without acute distress.  Hand-off given to patient's nurse.   Access used: LUE AVF Access issues: None  Total UF removed: 3000 mL Medication(s) given: Dilaudid 0.5 mg Post HD VS: please data insert    08/14/23 0448  Vitals  Temp 97.6 F (36.4 C)  Temp Source Oral  BP (!) 147/84  MAP (mmHg) 101  BP Location Right Arm  BP Method Automatic  Patient Position (if appropriate) Lying  Pulse Rate 81  Pulse Rate Source Monitor  ECG Heart Rate 82  Resp 19  Oxygen Therapy  SpO2 100 %  O2 Device Nasal Cannula  O2 Flow Rate (L/min) 2 L/min  Patient Activity (if Appropriate) In bed  Pulse Oximetry Type Continuous  Post Treatment  Dialyzer Clearance Lightly streaked  Hemodialysis Intake (mL) 0 mL  Liters Processed 84  Fluid Removed (mL) 3000 mL  Tolerated HD Treatment Yes  Post-Hemodialysis Comments Treatment completed and blood returned without issue.  AVG/AVF Arterial Site Held (minutes) 10 minutes (Gauze applied and secured with paper tape; hemostasis achieved.)  AVG/AVF Venous Site Held (minutes) 10 minutes (Gauze applied and secured with paper tape; hemostasis achieved.)  Note  Patient Observations Patient alert, no c/o voiced, no acute distress noted; patient condition stable for return transport.  Fistula / Graft Left Upper arm Arteriovenous fistula  Placement Date/Time: 03/04/21 1327   Placed prior to admission: No  Orientation: Left  Access Location: Upper arm  Access Type: (c) Arteriovenous fistula  Site Condition No complications  Fistula / Graft Assessment Present;Thrill;Bruit  Status Flushed;Patent;Deaccessed  Drainage Description None      [name]

## 2023-08-15 DIAGNOSIS — I10 Essential (primary) hypertension: Secondary | ICD-10-CM | POA: Diagnosis not present

## 2023-08-15 DIAGNOSIS — Z992 Dependence on renal dialysis: Secondary | ICD-10-CM | POA: Diagnosis not present

## 2023-08-15 DIAGNOSIS — I5023 Acute on chronic systolic (congestive) heart failure: Secondary | ICD-10-CM

## 2023-08-15 DIAGNOSIS — N186 End stage renal disease: Secondary | ICD-10-CM | POA: Diagnosis not present

## 2023-08-15 DIAGNOSIS — M25569 Pain in unspecified knee: Secondary | ICD-10-CM

## 2023-08-15 LAB — GLUCOSE, CAPILLARY
Glucose-Capillary: 174 mg/dL — ABNORMAL HIGH (ref 70–99)
Glucose-Capillary: 231 mg/dL — ABNORMAL HIGH (ref 70–99)
Glucose-Capillary: 195 mg/dL — ABNORMAL HIGH (ref 70–99)

## 2023-08-15 LAB — CBC WITH DIFFERENTIAL/PLATELET
Abs Immature Granulocytes: 0.02 10*3/uL (ref 0.00–0.07)
Basophils Absolute: 0 10*3/uL (ref 0.0–0.1)
Basophils Relative: 1 %
Eosinophils Absolute: 0.2 10*3/uL (ref 0.0–0.5)
Eosinophils Relative: 3 %
HCT: 32.6 % — ABNORMAL LOW (ref 39.0–52.0)
Hemoglobin: 9.9 g/dL — ABNORMAL LOW (ref 13.0–17.0)
Immature Granulocytes: 0 %
Lymphocytes Relative: 10 %
Lymphs Abs: 0.6 10*3/uL — ABNORMAL LOW (ref 0.7–4.0)
MCH: 27.8 pg (ref 26.0–34.0)
MCHC: 30.4 g/dL (ref 30.0–36.0)
MCV: 91.6 fL (ref 80.0–100.0)
Monocytes Absolute: 0.7 10*3/uL (ref 0.1–1.0)
Monocytes Relative: 13 %
Neutro Abs: 4.2 10*3/uL (ref 1.7–7.7)
Neutrophils Relative %: 73 %
Platelets: 391 10*3/uL (ref 150–400)
RBC: 3.56 MIL/uL — ABNORMAL LOW (ref 4.22–5.81)
RDW: 16.9 % — ABNORMAL HIGH (ref 11.5–15.5)
WBC: 5.7 10*3/uL (ref 4.0–10.5)
nRBC: 0 % (ref 0.0–0.2)

## 2023-08-15 LAB — RENAL FUNCTION PANEL
Albumin: 2.4 g/dL — ABNORMAL LOW (ref 3.5–5.0)
Anion gap: 11 (ref 5–15)
BUN: 80 mg/dL — ABNORMAL HIGH (ref 6–20)
CO2: 31 mmol/L (ref 22–32)
Calcium: 9.5 mg/dL (ref 8.9–10.3)
Chloride: 92 mmol/L — ABNORMAL LOW (ref 98–111)
Creatinine, Ser: 6.54 mg/dL — ABNORMAL HIGH (ref 0.61–1.24)
GFR, Estimated: 11 mL/min — ABNORMAL LOW (ref >60)
Glucose, Bld: 212 mg/dL — ABNORMAL HIGH (ref 70–99)
Phosphorus: 7 mg/dL — ABNORMAL HIGH (ref 2.5–4.6)
Potassium: 4.5 mmol/L (ref 3.5–5.1)
Sodium: 134 mmol/L — ABNORMAL LOW (ref 135–145)

## 2023-08-15 LAB — SYNOVIAL CELL COUNT + DIFF, W/ CRYSTALS
Crystals, Fluid: NONE SEEN
Eosinophils-Synovial: 6 % — ABNORMAL HIGH (ref 0–1)
Lymphocytes-Synovial Fld: 7 % (ref 0–20)
Monocyte-Macrophage-Synovial Fluid: 6 % — ABNORMAL LOW (ref 50–90)
Neutrophil, Synovial: 81 % — ABNORMAL HIGH (ref 0–25)
WBC, Synovial: UNDETERMINED /mm3 (ref 0–200)

## 2023-08-15 NOTE — Progress Notes (Signed)
  Progress Note   Patient: Bobby [name] ZOX:[id number] DOB: [birth year] DOA: 08/04/2023     11 DOS: the patient was seen and examined on 08/15/2023   Brief hospital course: Patient is a 27 year old male with history of ESRD on dialysis on TTS schedule, morbid obesity, systolic CHF, diabetes type 1, recurrent ascites requiring paracentesis, HTN, anxiety who presented with myalgia, shortness of breath, abdominal distention, lower extremity edema.  He was found to be severely volume overloaded on presentation and on acute respiratory failure with hypoxia and hypercarbia secondary to pulmonary edema.  Lab work showed hyperkalemia.  Nephrology consulted  for serial dialysis.  Patient is from SNF.  Patient has persistent weakness in bilateral lower extremities, imagings did not support any fracture or dislocation. PT/OT consulted , recommendation is to go back to SNF.  Waiting for SNF bed.    Assessment and Plan: ESRD on dialysis (HCC) Volume overload.   Patient having HD with good toleration, aggressive ultrafiltration for volume removal.   Essential hypertension Continue blood pressure control and monitoring.   Cirrhosis (HCC) No signs of acute decompensation.   Acute on chronic HFrEF (heart failure with reduced ejection fraction) (HCC) Echocardiogram with preserved LV systolic function. Continue ultrafiltration with hemodialysis  Continue blood pressure monitoring   Knee pain Bilateral knee pain and back pain, with ambulatory dysfunction. Pelvic fracture has been ruled out.  12/11 left knee aspiration and injection.         Subjective: Patient with improvement in dyspnea, continue to have left knee pain   Physical Exam: Vitals:   08/15/23 1130 08/15/23 1200 08/15/23 1230 08/15/23 1300  BP: 105/71 120/62 106/67 125/65  Pulse: 82 81 81 81  Resp: 14 12 14 13   Temp:      TempSrc:      SpO2: 100% 95% 91% (!) 88%  Weight:      Height:       Neurology awake and alert ENT  with mild pallor  Cardiovascular with S1 and S2 present and regular with no gallops, rubs or murmurs Respiratory with no rales or wheezing Abdomen with no distention  Positive lower extremity edema, left knee tender to palpation  Data Reviewed:    Family Communication: No family at the bedside   Disposition: Status is: Inpatient Remains inpatient appropriate because: HD   Planned Discharge Destination: SNF      Author: Coralie Keens, MD 08/15/2023 1:29 PM  For on call review [email or web address].

## 2023-08-15 NOTE — Assessment & Plan Note (Addendum)
Sp paracentesis, 12/02 with 3 L removed.  Plan to repeat paracentesis.  He gets outpatient paracentesis routinely.

## 2023-08-15 NOTE — Plan of Care (Signed)
  Problem: Nutritional: Goal: Maintenance of adequate nutrition will improve Outcome: Completed/Met   Problem: Skin Integrity: Goal: Risk for impaired skin integrity will decrease Outcome: Completed/Met

## 2023-08-15 NOTE — Progress Notes (Signed)
PT Cancellation Note  Patient Details Name: Bobby [name] MRN: [id number] DOB: 1996-07-18   Cancelled Treatment:    Reason Eval/Treat Not Completed: Patient at procedure or test/unavailable (Pt off unit for HD. Will follow up at later date/time as pt able and schedule allows.)   Renaldo Fiddler PT, DPT Acute Rehabilitation Services Office [phone]  08/15/23 11:39 AM

## 2023-08-15 NOTE — Assessment & Plan Note (Addendum)
Echocardiogram 06/2023 (care everywhere) preserved LV systolic function with EF 55 to 60%, mild LVH, possible loculated inferior small size pericardial effusion, RV with normal size, and normal systolic function, LA and RA with normal size, no significant valvular disease.    Continue ultrafiltration with hemodialysis  Continue blood pressure monitoring   Continue with carvedilol and entresto Ok to discontinue telemetry.

## 2023-08-15 NOTE — Assessment & Plan Note (Signed)
Bilateral knee pain and back pain, with ambulatory dysfunction. Pelvic fracture has been ruled out.  12/11 left knee aspiration and injection.   Positive ambulatory dysfunction.

## 2023-08-15 NOTE — Inpatient Diabetes Management (Signed)
Inpatient Diabetes Program Recommendations  AACE/ADA: New Consensus Statement on Inpatient Glycemic Control (2015)  Target Ranges:  Prepandial:   less than 140 mg/dL      Peak postprandial:   less than 180 mg/dL (1-2 hours)      Critically ill patients:  140 - 180 mg/dL   Lab Results  Component Value Date   GLUCAP 174 (H) 08/15/2023   HGBA1C 6.2 (H) 08/05/2023    Review of Glycemic Control  Latest Reference Range & Units 08/14/23 05:20 08/14/23 11:14 08/14/23 16:16 08/14/23 21:31 08/15/23 06:43  Glucose-Capillary 70 - 99 mg/dL 474 (H) 259 (H) 563 (H) 209 (H) 174 (H)  (H): Data is abnormally high Diabetes history: Type 1 DM?  Outpatient Diabetes medications: novolog SSI Current orders for Inpatient glycemic control: Novolog 0-15 units TID, Novolog 0-5 units at bedtime, Novolog 3 units TID, Semglee 5 units QD Prednisone 20 mg every day Depomedrol 40 mg IA Positive Insulin antibodies- others WNL from 04/2023 No endocrinology note noted   Inpatient Diabetes Program Recommendations:     Consider increasing Semglee 10 units every day - In the setting of steroids:Add Novolog 3 units TID (Assuming patient is consuming >50% of meals)  Thanks, Lujean Rave, MSN, RNC-OB Diabetes Coordinator [phone] (8a-5p)

## 2023-08-15 NOTE — Assessment & Plan Note (Signed)
 Continue blood pressure control and monitoring.

## 2023-08-15 NOTE — Progress Notes (Signed)
[facility] Progress Note   Subjective:   Seen at start of HD. He wants to challenge UF goal today, says he can tolerate 6L. Ok with me if BP holds. Denies SOB, CP, dizziness, nausea.   Objective Vitals:   08/15/23 1130 08/15/23 1200 08/15/23 1230 08/15/23 1300  BP: 105/71 120/62 106/67 125/65  Pulse: 82 81 81 81  Resp: 14 12 14 13   Temp:      TempSrc:      SpO2: 100% 95% 91% (!) 88%  Weight:      Height:       Physical Exam General: Alert male in NAD Heart: RRR, no murmurs, rubs or gallops Lungs: Diminished BS in bases. No wheezing, rhonchi or rales auscultated Abdomen: Soft, moderately distended, +BS Extremities: 3+ edema bilateral lower extremities Dialysis Access: AVF + t/b  Additional Objective Labs: Basic Metabolic Panel: Recent Labs  Lab 08/13/23 1101 08/14/23 0741 08/15/23 0245  NA 140 135 134*  K 4.6 4.2 4.5  CL 95* 90* 92*  CO2 31 33* 31  GLUCOSE 311* 343* 212*  BUN 79* 66* 80*  CREATININE 7.45* 5.72* 6.54*  CALCIUM 9.7 9.4 9.5  PHOS 8.3* 6.2* 7.0*   Liver Function Tests: Recent Labs  Lab 08/13/23 1101 08/14/23 0741 08/15/23 0245  ALBUMIN 2.4* 2.4* 2.4*   No results for input(s): "LIPASE", "AMYLASE" in the last 168 hours. CBC: Recent Labs  Lab 08/12/23 0843 08/13/23 0247 08/13/23 1101 08/14/23 0741 08/15/23 0245  WBC 4.5 6.0 5.5 6.1 5.7  NEUTROABS 3.8 5.1  --  5.3 4.2  HGB 9.7* 10.0* 9.7* 9.6* 9.9*  HCT 32.8* 33.4* 32.9* 32.3* 32.6*  MCV 91.1 92.0 92.2 91.5 91.6  PLT 349 393 355 385 391   Blood Culture    Component Value Date/Time   SDES BLOOD BLOOD RIGHT ARM 08/05/2023 0236   SPECREQUEST  08/05/2023 0236    BOTTLES DRAWN AEROBIC AND ANAEROBIC Blood Culture adequate volume   CULT  08/05/2023 0236    NO GROWTH 5 DAYS Performed at [facility], [address] [place], [place] [address]    REPTSTATUS 08/10/2023 FINAL 08/05/2023 0236    Cardiac Enzymes: No results for input(s): "CKTOTAL", "CKMB",  "CKMBINDEX", "TROPONINI" in the last 168 hours. CBG: Recent Labs  Lab 08/14/23 0520 08/14/23 1114 08/14/23 1616 08/14/23 2131 08/15/23 0643  GLUCAP 297* 303* 210* 209* 174*   Iron Studies: No results for input(s): "IRON", "TIBC", "TRANSFERRIN", "FERRITIN" in the last 72 hours. @lablastinr3 @ Studies/Results: No results found. Medications:   bupivacaine(PF)  10 mL Infiltration Once   carvedilol  25 mg Oral BID WC   Chlorhexidine Gluconate Cloth  6 each Topical Q0600   Chlorhexidine Gluconate Cloth  6 each Topical Q0600   darbepoetin (ARANESP) injection - DIALYSIS  200 mcg Subcutaneous Q Mon-1800   feeding supplement (NEPRO CARB STEADY)  237 mL Oral BID BM   gabapentin  300 mg Oral BID   heparin  5,000 Units Subcutaneous Q8H   hydrALAZINE  100 mg Oral TID   insulin aspart  0-15 Units Subcutaneous TID WC   insulin aspart  0-5 Units Subcutaneous QHS   insulin aspart  3 Units Subcutaneous TID WC   insulin glargine-yfgn  5 Units Subcutaneous Daily   isosorbide mononitrate  90 mg Oral Daily   loratadine  10 mg Oral Daily   methylPREDNISolone acetate  40 mg Intra-articular Once   polyethylene glycol  17 g Oral Daily   [START ON 08/17/2023] predniSONE  10 mg Oral Q breakfast   predniSONE  20 mg Oral Q breakfast   sacubitril-valsartan  1 tablet Oral BID   sucroferric oxyhydroxide  1,000 mg Oral TID WC   sucroferric oxyhydroxide  1,000 mg Oral With snacks    Dialysis Orders: HP TTS 4:15 500/A2x EDW 112 kg 2K/2Ca AVF Heparin 62952 U Last HD 11/28 - post HD wt 146 kg  Mircera 200 q 2 weeks (last 11/19) Venofer 100 x 10  Hectorol 8 q TIW    Assessment/Plan: #Profound volume overload - Chronic issue.  > 30 kg over EDW on presentation.  Received serial HD treatment. Remains extremely volume overloaded. Needs more aggressive UF goals, trying for 6L today. Continue volume restriction (reinforced again today) and extra HD as schedule allows.  #ESRD -  HD TTS as OP via AVF. Serial HD as  above.  #Hyperkalemia - has corrected with HD #Hypertension/volume  - BP acceptable. Massive volume overload as above #Anemia  -  on ESA- continue here.  Hgb 9.9. Likely a component of hemodilution too.  #CKD-MBD/hyperphosphatemia-Phos elevated. currently on Velphoro, added 2 tablet with snacks as well.  Follow labs.  Calcium borderline, not currently on VDRA #Recurrent ascites -  s/p paracentesis 12/2 of 3 liters.  Further paracentesis deferred to primary team. #Nutrition - Renal diet with fluid restriction # Pain presumably due to edema and fluid overload.   CT scan of pelvis with no fracture.  Continue supportive care, started empiric steroids by primary team because of increased inflammatory marker.    Rogers Blocker, PA-C 08/15/2023, 1:15 PM  [employer] Pager: [phone]

## 2023-08-15 NOTE — Procedures (Signed)
Procedure: Left knee aspiration and injection   Indication: Left knee effusion(s)   Surgeon: Charma Igo, PA-C   Assist: None   Anesthesia: Topical refrigerant   EBL: None   Complications: None   Findings: After risks/benefits explained patient desires to undergo procedure. Consent obtained and time out performed. The left knee was sterilely prepped and aspirated. 1.56ml bloody fluid obtained. 6ml 0.5% Marcaine and 40mg  depo-medrol instilled. Pt tolerated the procedure well.       Freeman Caldron, PA-C Orthopedic Surgery [phone]

## 2023-08-15 NOTE — Hospital Course (Addendum)
Mr. Stepka was admitted to the hospital with the working diagnosis of volume overload in the setting of ESRD.   27 year old male with history of ESRD on dialysis on TTS schedule, morbid obesity, systolic CHF, diabetes type 1, recurrent ascites requiring paracentesis, HTN, anxiety who presented with myalgia, shortness of breath, abdominal distention, lower extremity edema.  He has been not compliant with outpatient hemodialysis and outpatient paracentesis.   He was found to be severely volume overloaded on presentation and on acute respiratory failure with hypoxia and hypercarbia secondary to pulmonary edema.  His blood pressure was 147/75, HT 90, RR 23 and 02 saturation 91% on supplemental 02 per [place], lungs with bilateral rales with no wheezing, heart with S1 and S2 present and regular, abdomen non tender, positive ascites, positive lower extremity edema.   Na 142, K 5.9 Cl 97, bicarbonate 32, glucose 132 bun 66 cr 9,5  AST 43 ALT 35 total bilirubin 1.5  Wbc 5.4 hgb 8,9 plt 231  Sars covid 19 negative   Chest radiograph with cardiomegaly with diffuse extensive interstitial infiltrates bilaterally (homogenous), with bilateral pleural effusions.   12/11 left knee aspiration and steroid injection.   Nephrology consulted  for serial dialysis.   Patient is from SNF.  Patient has persistent weakness in bilateral lower extremities, imagings did not support any fracture or dislocation. PT/OT consulted , recommendation is to go back to SNF.    12/14 volume status is improving with ultrafiltration, plan to return to SNF when euvolemic state.  12/16 paracentesis 3,9 L removed with good toleration.  12/17 patient had HD with good toleration.

## 2023-08-15 NOTE — Procedures (Signed)
HD Note:  Some information was entered later than the data was gathered due to patient care needs. The stated time with the data is accurate.  Received patient in bed to unit.   Alert and oriented.  Patient stated pain in left leg, the medication not yet due.  Rogers Blocker, PA at bedside during rounds.  Patient stated he felt he could have 6000 ml removed during today's treatment.  The UF goal was raised to 6000 ml as tolerated.  Informed consent signed and in chart.   Access used: Left upper arm fistula Access issues: None  Patient tolerated treatment well.  Patient rested with eyes closed and slight snore  as soon as treatment started  Approximately 40 min prior to the end of treatment, the patient awoke in pain. Med given, see MAR  TX duration: 3.5 hours  Alert, without acute distress.  Patient stated his pain did not lessen since the medication given. No new complaints   Total UF removed: 6000 ml  Hand-off given to patient's nurse.   Transported back to the room   [place] Sherburn L. Dareen Piano, RN Kidney Dialysis Unit.

## 2023-08-15 NOTE — Assessment & Plan Note (Addendum)
Volume overload. Hyponatremia, hyperkalemia   Pre HD BUN 113, K 5.4 and serum bicarbonate at 31  Na 137   Patient having HD with good toleration, aggressive ultrafiltration for volume removal.  Fluid restriction.  He is not yet back to his dry weight.  Continue to follow up nephrology recommendations regards of inpatient renal replacement therapy.   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   Acute metabolic encephalopath has resolved.  Decreased frequency and dose of opiate analgesics.  Decreased dose of cyclobenzaprine and discontinue gabapentin, with improvement in his mentation.   Continue neuro checks per unit protocol.  Patient very debilitated.

## 2023-08-15 NOTE — TOC Progression Note (Addendum)
Transition of Care [facility]) - Progression Note    Patient Details  Name: Bobby [name] MRN: [id number] Date of Birth: Jan 19, 1996  Transition of Care [facility]) CM/SW Contact  Michaela Corner, [place] Phone Number: 08/15/2023, 2:18 PM  Clinical Narrative:   CSW spoke with [facility] CM about pts care. CM is adamant about pt not returning to [facility]. CSW broadened search for SNF. [facility] is able to accept at this time but will need dialysis center changed.  CSW left VM for [facility] CM and pts sister to return call.   3:10PM: CSW spoke with [facility] and they are able to accommodate pts needs at this time; they will need 1 days notice to prepare for pts arrival - CSW relayed information to Attending. CSW called pts sister, [name], who stated that pt and herself reside closer to [place] and inquired if pt will have to [facility] dialysis centers if he goes there. CSW confirmed with [facility] that pt can continue to go to [facility] in [place] if he dc's to [place].   CSW spoke w/ pt, providing him medicare.gov ratings of accepting SNFs. Pt stated he will speak with his sister regarding accepting facilities.   Per MD, pt is still needing aggressive inpatient hemodialysis. Looking at potentially DC Monday. CSW will update facility.   Skilled Nursing Rehab Facilities-   [email or web address]     Ratings out of 5 stars (5 the highest)    Name Address  Phone # Quality Care Staffing Health Inspection Overall  [facility] [address], [facility] [phone] 2 1 2 1   [facility] [address] [place], [place] [phone] 5 1 2 2   [facility] ([place]) [address] [place] [phone] 2 2 2 2   [facility] ([place]) [address] [place], [place] [phone] 3 1 1 1   [facility] [address] [place] [phone] 2 1 1 1       Expected Discharge Plan: Skilled Nursing Facility Barriers to Discharge: Continued Medical Work up, SNF  Pending bed offer  Expected Discharge Plan and Services In-house Referral: Clinical Social Work     Living arrangements for the past 2 months: Skilled Nursing Facility                                       Social Determinants of Health (SDOH) Interventions SDOH Screenings   Food Insecurity: No Food Insecurity (08/05/2023)  Recent Concern: Food Insecurity - High Risk (05/29/2023)   Received from [facility]  Housing: Low Risk  (08/05/2023)  Transportation Needs: No Transportation Needs (08/05/2023)  Recent Concern: Transportation Needs - Unmet Transportation Needs (07/10/2023)   Received from [facility]  Utilities: Not At Risk (08/05/2023)  Recent Concern: Utilities - Medium Risk (05/29/2023)   Received from [facility]  Financial Resource Strain: Low Risk  (07/20/2022)   [facility] visits prior to 11/04/2022., [facility], [facility] visits prior to 11/04/2022., [facility]  Physical Activity: Insufficiently Active (07/20/2022)   [facility], [facility] visits prior to 11/04/2022., [facility] visits prior to 11/04/2022., [facility]  Social Connections: Unknown (07/20/2022)   [facility] visits prior to 11/04/2022., [facility] visits prior to 11/04/2022.  Stress: No Stress Concern Present (07/20/2022)   [facility], [facility]  visits prior to 11/04/2022., [facility] visits prior to 11/04/2022., [facility]  Tobacco Use: Medium Risk (08/04/2023)    Readmission Risk Interventions    04/19/2023    3:26 PM 03/11/2021    3:57 PM  Readmission Risk Prevention Plan  Transportation Screening Complete Complete  PCP or Specialist Appt within 5-7 Days Complete Not Complete  Not Complete comments  03/29/21 is first available appt  Home Care Screening  Complete Complete  Medication Review (RN CM) Referral to Pharmacy

## 2023-08-16 DIAGNOSIS — I10 Essential (primary) hypertension: Secondary | ICD-10-CM | POA: Diagnosis not present

## 2023-08-16 DIAGNOSIS — I5023 Acute on chronic systolic (congestive) heart failure: Secondary | ICD-10-CM | POA: Diagnosis not present

## 2023-08-16 DIAGNOSIS — N186 End stage renal disease: Secondary | ICD-10-CM | POA: Diagnosis not present

## 2023-08-16 DIAGNOSIS — E1065 Type 1 diabetes mellitus with hyperglycemia: Secondary | ICD-10-CM | POA: Insufficient documentation

## 2023-08-16 DIAGNOSIS — Z992 Dependence on renal dialysis: Secondary | ICD-10-CM | POA: Diagnosis not present

## 2023-08-16 LAB — RENAL FUNCTION PANEL
Albumin: 2.6 g/dL — ABNORMAL LOW (ref 3.5–5.0)
Anion gap: 12 (ref 5–15)
BUN: 75 mg/dL — ABNORMAL HIGH (ref 6–20)
CO2: 30 mmol/L (ref 22–32)
Calcium: 9.5 mg/dL (ref 8.9–10.3)
Chloride: 91 mmol/L — ABNORMAL LOW (ref 98–111)
Creatinine, Ser: 5.82 mg/dL — ABNORMAL HIGH (ref 0.61–1.24)
GFR, Estimated: 13 mL/min — ABNORMAL LOW (ref >60)
Glucose, Bld: 244 mg/dL — ABNORMAL HIGH (ref 70–99)
Phosphorus: 6.4 mg/dL — ABNORMAL HIGH (ref 2.5–4.6)
Potassium: 4.6 mmol/L (ref 3.5–5.1)
Sodium: 133 mmol/L — ABNORMAL LOW (ref 135–145)

## 2023-08-16 LAB — GLUCOSE, CAPILLARY
Glucose-Capillary: 186 mg/dL — ABNORMAL HIGH (ref 70–99)
Glucose-Capillary: 130 mg/dL — ABNORMAL HIGH (ref 70–99)
Glucose-Capillary: 212 mg/dL — ABNORMAL HIGH (ref 70–99)
Glucose-Capillary: 180 mg/dL — ABNORMAL HIGH (ref 70–99)

## 2023-08-16 MED ORDER — SUCROFERRIC OXYHYDROXIDE 500 MG PO CHEW
1000.0000 mg | CHEWABLE_TABLET | ORAL | Status: DC | PRN
Start: 2023-08-16 — End: 2023-08-23

## 2023-08-16 NOTE — Progress Notes (Addendum)
  Progress Note   Patient: Bobby [name] OZH:[id number] DOB: 1996-07-10 DOA: 08/04/2023     12 DOS: the patient was seen and examined on 08/16/2023   Brief hospital course: Patient is a 27 year old male with history of ESRD on dialysis on TTS schedule, morbid obesity, systolic CHF, diabetes type 1, recurrent ascites requiring paracentesis, HTN, anxiety who presented with myalgia, shortness of breath, abdominal distention, lower extremity edema.  He was found to be severely volume overloaded on presentation and on acute respiratory failure with hypoxia and hypercarbia secondary to pulmonary edema.  Lab work showed hyperkalemia.  Nephrology consulted  for serial dialysis.  Patient is from SNF.  Patient has persistent weakness in bilateral lower extremities, imagings did not support any fracture or dislocation. PT/OT consulted , recommendation is to go back to SNF.  Waiting for SNF bed.    Assessment and Plan: ESRD on dialysis (HCC) Volume overload.   Patient having HD with good toleration, aggressive ultrafiltration for volume removal.   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    Essential hypertension Continue blood pressure control with hydralazine/ isosorbide, carvedilol and entresto   Cirrhosis (HCC) Sp paracentesis, 12/02 with 3 L removed.   Acute on chronic HFrEF (heart failure with reduced ejection fraction) (HCC) Echocardiogram with preserved LV systolic function. Continue ultrafiltration with hemodialysis  Continue blood pressure monitoring   Continue with carvedilol and entresto   Knee pain Bilateral knee pain and back pain, with ambulatory dysfunction. Pelvic fracture has been ruled out.  12/11 left knee aspiration and injection.   Positive ambulatory dysfunction.   Uncontrolled type 1 diabetes mellitus with hyperglycemia, with long-term current use of insulin (HCC) Continue insulin sliding scale for glucose cover  and monitoring. Continue basal insulin.  Fasting glucose 244 mg/dl.         Subjective: Patient continue to have dyspnea and edema, no chest pain   Physical Exam: Vitals:   08/16/23 0800 08/16/23 1127 08/16/23 1550 08/16/23 1658  BP: 118/63 (!) 113/56 131/65 134/73  Pulse: 84 84 85 86  Resp: 18 18 17 18   Temp: 98.3 F (36.8 C) 98.3 F (36.8 C)  98.3 F (36.8 C)  TempSrc: Oral Oral  Oral  SpO2: 100% 96% 98% 95%  Weight:      Height:       Neurology deconditioned and ill looking appearing, somnolent but easy to arouse.  ENT with mild pallor Cardiovascular with S1 and S2 present and regular with no gallops, rubs or murmurs Respiratory with scattered rales with no wheezing Abdomen with no distention  Data Reviewed:    Family Communication: no family at the bedside   Disposition: Status is: Inpatient Remains inpatient appropriate because: renal replacement therapy   Planned Discharge Destination: Home     Author: Coralie Keens, MD 08/16/2023 5:11 PM  For on call review [email or web address].

## 2023-08-16 NOTE — Plan of Care (Signed)
  Problem: Education: Goal: Knowledge of General Education information will improve Description: Including pain rating scale, medication(s)/side effects and non-pharmacologic comfort measures Outcome: Progressing   Problem: Health Behavior/Discharge Planning: Goal: Ability to manage health-related needs will improve Outcome: Progressing   Problem: Clinical Measurements: Goal: Ability to maintain clinical measurements within normal limits will improve Outcome: Progressing Goal: Will remain free from infection Outcome: Progressing Goal: Diagnostic test results will improve Outcome: Progressing Goal: Respiratory complications will improve Outcome: Progressing Goal: Cardiovascular complication will be avoided Outcome: Progressing   Problem: Activity: Goal: Risk for activity intolerance will decrease Outcome: Progressing   Problem: Nutrition: Goal: Adequate nutrition will be maintained Outcome: Progressing   Problem: Coping: Goal: Level of anxiety will decrease Outcome: Progressing   Problem: Elimination: Goal: Will not experience complications related to bowel motility Outcome: Progressing   Problem: Pain Management: Goal: General experience of comfort will improve Outcome: Progressing   Problem: Safety: Goal: Ability to remain free from injury will improve Outcome: Progressing   Problem: Skin Integrity: Goal: Risk for impaired skin integrity will decrease Outcome: Progressing   Problem: Education: Goal: Ability to describe self-care measures that may prevent or decrease complications (Diabetes Survival Skills Education) will improve Outcome: Progressing Goal: Individualized Educational Video(s) Outcome: Progressing   Problem: Coping: Goal: Ability to adjust to condition or change in health will improve Outcome: Progressing   Problem: Fluid Volume: Goal: Ability to maintain a balanced intake and output will improve Outcome: Progressing   Problem: Health  Behavior/Discharge Planning: Goal: Ability to identify and utilize available resources and services will improve Outcome: Progressing Goal: Ability to manage health-related needs will improve Outcome: Progressing   Problem: Metabolic: Goal: Ability to maintain appropriate glucose levels will improve Outcome: Progressing   Problem: Nutritional: Goal: Progress toward achieving an optimal weight will improve Outcome: Progressing   Problem: Tissue Perfusion: Goal: Adequacy of tissue perfusion will improve Outcome: Progressing

## 2023-08-16 NOTE — Assessment & Plan Note (Addendum)
Continue insulin sliding scale for glucose cover and monitoring. Continue basal insulin.  Fasting glucose 80 mg/ dl today.  Capillary glucose has been controlled.

## 2023-08-16 NOTE — TOC Progression Note (Addendum)
Transition of Care [facility]) - Progression Note    Patient Details  Name: Bobby [name] MRN: [id number] Date of Birth: Mar 18, 1996  Transition of [facility]) CM/SW Contact  Michaela Corner, [place] Phone Number: 08/16/2023, 10:44 AM  Clinical Narrative:   CSW left VM for pts sister regarding SNF choice.   12:48PM: CSW spoke with pt about SNF choice. Pt states him and his sister are still gathering information.  1:49PM: CSW left second VM for pts sister about SNF choice.   2:27PM: Pts sister called CSW about dc plan and asked about pt coming home and having HH set up. CSW notified NCM, nurse, and MD of inquiry.   2:57PM: [name] states her son (54 yo) is willing to help take care of pt, as well as her other sister. [name] states she is working with pts [employer] CM on a personal care aid at home. [name] states her job is willing to let her take time off to care for pt as well.   Expected Discharge Plan: Skilled Nursing Facility Barriers to Discharge: Continued Medical Work up, SNF Pending bed offer  Expected Discharge Plan and Services In-house Referral: Clinical Social Work     Living arrangements for the past 2 months: Skilled Nursing Facility                                       Social Determinants of Health (SDOH) Interventions SDOH Screenings   Food Insecurity: No Food Insecurity (08/05/2023)  Recent Concern: Food Insecurity - High Risk (05/29/2023)   Received from [facility]  Housing: Low Risk  (08/05/2023)  Transportation Needs: No Transportation Needs (08/05/2023)  Recent Concern: Transportation Needs - Unmet Transportation Needs (07/10/2023)   Received from [facility]  Utilities: Not At Risk (08/05/2023)  Recent Concern: Utilities - Medium Risk (05/29/2023)   Received from [facility]  Financial Resource Strain: Low Risk  (07/20/2022)   [facility] visits prior to 11/04/2022., [facility], [facility] visits prior to 11/04/2022., [facility]  Physical Activity: Insufficiently Active (07/20/2022)   [facility], [facility] visits prior to 11/04/2022., [facility] visits prior to 11/04/2022., [facility]  Social Connections: Unknown (07/20/2022)   [facility] visits prior to 11/04/2022., [facility] visits prior to 11/04/2022.  Stress: No Stress Concern Present (07/20/2022)   [facility], [facility] visits prior to 11/04/2022., [facility] visits prior to 11/04/2022., [facility]  Tobacco Use: Medium Risk (08/04/2023)    Readmission Risk Interventions    04/19/2023    3:26 PM 03/11/2021    3:57 PM  Readmission Risk Prevention Plan  Transportation Screening Complete Complete  PCP or Specialist Appt within 5-7 Days Complete Not Complete  Not Complete comments  03/29/21 is first available appt  Home Care Screening Complete Complete  Medication Review (RN CM) Referral to Pharmacy

## 2023-08-16 NOTE — TOC Progression Note (Addendum)
Transition of Care [facility]) - Progression Note    Patient Details  Name: Bobby [name] MRN: [id number] Date of Birth: 1995-10-30  Transition of Care [facility]) CM/SW Contact  Leone Haven, RN Phone Number: 08/16/2023, 3:37 PM  Clinical Narrative:    NCM notified that  patient wants to go home with [facility], NCM offered choice to patient, he states to call his sister, [name], choice offered to her , she has no preference,  NCM made referral to [facility] with [facility] for HHPT, HHOT, and SW,  she is able to take referral.  Soc will begin 24 to 48 hrs post dc.  [name], she states he has home care aide with [employer] from 9 am to 12 for 3 days a week, but they are working on increasing his hours for the aide, also she states there will be someone with him at all times.  He will need ambulance transport at dc. NCM called outpatient pt in [place] at [phone] to cancel the referral for outpt pt.   Expected Discharge Plan: Skilled Nursing Facility Barriers to Discharge: Continued Medical Work up, SNF Pending bed offer  Expected Discharge Plan and Services In-house Referral: Clinical Social Work     Living arrangements for the past 2 months: Skilled Nursing Facility                           HH Arranged: PT, OT HH Agency: [facility] Date HH Agency Contacted: 08/16/23 Time HH Agency Contacted: 1536 Representative spoke with at [facility] Agency: Tresa Endo   Social Determinants of Health (SDOH) Interventions SDOH Screenings   Food Insecurity: No Food Insecurity (08/05/2023)  Recent Concern: Food Insecurity - High Risk (05/29/2023)   Received from [facility]  Housing: Low Risk  (08/05/2023)  Transportation Needs: No Transportation Needs (08/05/2023)  Recent Concern: Transportation Needs - Unmet Transportation Needs (07/10/2023)   Received from [facility]  Utilities: Not At Risk (08/05/2023)  Recent Concern: Utilities - Medium Risk (05/29/2023)   Received from [facility]   Financial Resource Strain: Low Risk  (07/20/2022)   [facility] visits prior to 11/04/2022., [facility], [facility] visits prior to 11/04/2022., [facility]  Physical Activity: Insufficiently Active (07/20/2022)   [facility], [facility] visits prior to 11/04/2022., [facility] visits prior to 11/04/2022., [facility]  Social Connections: Unknown (07/20/2022)   [facility] visits prior to 11/04/2022., [facility] visits prior to 11/04/2022.  Stress: No Stress Concern Present (07/20/2022)   Received from [facility], [facility] visits prior to 11/04/2022., [facility] visits prior to 11/04/2022., [facility]  Tobacco Use: Medium Risk (08/04/2023)    Readmission Risk Interventions    04/19/2023    3:26 PM 03/11/2021    3:57 PM  Readmission Risk Prevention Plan  Transportation Screening Complete Complete  PCP or Specialist Appt within 5-7 Days Complete Not Complete  Not Complete comments  03/29/21 is first available appt  Home Care Screening Complete Complete  Medication Review (RN CM) Referral to Pharmacy

## 2023-08-16 NOTE — Progress Notes (Signed)
[facility] Progress Note   Subjective:   Patient tolerated HD yesterday with UF 6L. Reports he felt a little tired otherwise but otherwise no issues, no cramping or dizziness. Denies SOB, CP, abdominal pain, nausea.   Objective Vitals:   08/16/23 0231 08/16/23 0433 08/16/23 0800 08/16/23 1127  BP: 130/65 127/69 118/63 (!) 113/56  Pulse: 81 81 84 84  Resp: 18 18 18 18   Temp: (!) 97.5 F (36.4 C) 98.3 F (36.8 C) 98.3 F (36.8 C) 98.3 F (36.8 C)  TempSrc: Oral Oral Oral Oral  SpO2: 100% 98% 100% 96%  Weight:      Height:       Physical Exam General: Alert male in NAD Heart: RRR, no murmurs, rubs or gallops Lungs: Diminished lung sounds in bases, no wheezing, rhonchi or rales auscultated Abdomen: Soft, non-distended, +BS Extremities: 2-3+ pitting edema b/l lower extremities Dialysis Access:  AVF + t/b  Additional Objective Labs: Basic Metabolic Panel: Recent Labs  Lab 08/14/23 0741 08/15/23 0245 08/16/23 0224  NA 135 134* 133*  K 4.2 4.5 4.6  CL 90* 92* 91*  CO2 33* 31 30  GLUCOSE 343* 212* 244*  BUN 66* 80* 75*  CREATININE 5.72* 6.54* 5.82*  CALCIUM 9.4 9.5 9.5  PHOS 6.2* 7.0* 6.4*   Liver Function Tests: Recent Labs  Lab 08/14/23 0741 08/15/23 0245 08/16/23 0224  ALBUMIN 2.4* 2.4* 2.6*   No results for input(s): "LIPASE", "AMYLASE" in the last 168 hours. CBC: Recent Labs  Lab 08/12/23 0843 08/13/23 0247 08/13/23 1101 08/14/23 0741 08/15/23 0245  WBC 4.5 6.0 5.5 6.1 5.7  NEUTROABS 3.8 5.1  --  5.3 4.2  HGB 9.7* 10.0* 9.7* 9.6* 9.9*  HCT 32.8* 33.4* 32.9* 32.3* 32.6*  MCV 91.1 92.0 92.2 91.5 91.6  PLT 349 393 355 385 391   Blood Culture    Component Value Date/Time   SDES BLOOD BLOOD RIGHT ARM 08/05/2023 0236   SPECREQUEST  08/05/2023 0236    BOTTLES DRAWN AEROBIC AND ANAEROBIC Blood Culture adequate volume   CULT  08/05/2023 0236    NO GROWTH 5 DAYS Performed at [facility], [address] [place], [place]  [address]    REPTSTATUS 08/10/2023 FINAL 08/05/2023 0236    Cardiac Enzymes: No results for input(s): "CKTOTAL", "CKMB", "CKMBINDEX", "TROPONINI" in the last 168 hours. CBG: Recent Labs  Lab 08/15/23 0643 08/15/23 1610 08/15/23 2127 08/16/23 0631 08/16/23 1121  GLUCAP 174* 231* 195* 186* 130*   Iron Studies: No results for input(s): "IRON", "TIBC", "TRANSFERRIN", "FERRITIN" in the last 72 hours. @lablastinr3 @ Studies/Results: No results found. Medications:   bupivacaine(PF)  10 mL Infiltration Once   carvedilol  25 mg Oral BID WC   Chlorhexidine Gluconate Cloth  6 each Topical Q0600   Chlorhexidine Gluconate Cloth  6 each Topical Q0600   darbepoetin (ARANESP) injection - DIALYSIS  200 mcg Subcutaneous Q Mon-1800   feeding supplement (NEPRO CARB STEADY)  237 mL Oral BID BM   gabapentin  300 mg Oral BID   heparin  5,000 Units Subcutaneous Q8H   hydrALAZINE  100 mg Oral TID   insulin aspart  0-15 Units Subcutaneous TID WC   insulin aspart  0-5 Units Subcutaneous QHS   insulin aspart  3 Units Subcutaneous TID WC   insulin glargine-yfgn  5 Units Subcutaneous Daily   isosorbide mononitrate  90 mg Oral Daily   loratadine  10 mg Oral Daily   methylPREDNISolone acetate  40 mg Intra-articular Once  polyethylene glycol  17 g Oral Daily   [START ON 08/17/2023] predniSONE  10 mg Oral Q breakfast   sacubitril-valsartan  1 tablet Oral BID   sucroferric oxyhydroxide  1,000 mg Oral TID WC    Dialysis Orders: [facility] TTS 4:15 500/A2x EDW 112 kg 2K/2Ca AVF Heparin 09811 U Last HD 11/28 - post HD wt 146 kg  Mircera 200 q 2 weeks (last 11/19) Venofer 100 x 10  Hectorol 8 q TIW  Assessment/Plan: #Profound volume overload - Chronic issue.  > 30 kg over EDW on presentation.  Received serial HD treatment. Remains extremely volume overloaded. Needs more aggressive UF goals, tolerated 6L on 08/15/23, continue high UF goals. Continue volume restriction (reinforced again today) and extra HD as  schedule allows.  #ESRD -  HD TTS as OP via AVF. Serial HD as above.  #Hyperkalemia - has corrected with HD #Hypertension/volume  - BP acceptable. Massive volume overload as above #Anemia  -  on ESA- continue here.  Hgb 9.9. Likely a component of hemodilution too.  #CKD-MBD/hyperphosphatemia-Phos elevated. currently on Velphoro, added 2 tablet with snacks as well.  Follow labs.  Calcium borderline, not currently on VDRA #Recurrent ascites -  s/p paracentesis 12/2 of 3 liters.  Further paracentesis deferred to primary team. #Nutrition - Renal diet with fluid restriction # Pain presumably due to edema and fluid overload.   CT scan of pelvis with no fracture.  Continue supportive care, started empiric steroids by primary team because of increased inflammatory marker.      Rogers Blocker, PA-C 08/16/2023, 1:29 PM  [facility] Pager: [phone]

## 2023-08-16 NOTE — Progress Notes (Signed)
Physical Therapy Treatment Patient Details Name: Bobby [name] MRN: [id number] DOB: 11-Dec-1995 Today's Date: 08/16/2023   History of Present Illness Pt is 27 yo presenting to [facility] ED with diffuse muscle aches after missing HD session on Tuesday. Also reporting dyspnea on exertion, worsening abdominal swelling and leg swelling. Pt was able to get HD on Thursday with 6L removed. PMH includes recurrent ascites requiring paracentesis, ESRD (HD TTS), HFrEF, type 1 diabetes, HTN, obesity.    PT Comments  Pt resting in bed and agreeable to mobilize with therapy. Pt highly motivated to work with specialized tilt bed. Gentle manual soft tissue/retrograde massage performed for bil LE to alleviate pain in preparation for weight bearing. Pt able to reposition self superiorly with bil UE pull up in trendelenburg prior to verticalizing. Patient tolerated ~20 minutes of verticalization at 15*, 30*, and 40*. Pt performed functional squats, marches, and heel/toe raises at various angles. Weight bearing max 56 kg at 40* for ~7 minutes. Cues needed during mini-squats for cervical flexion to deter pt straining and holding his breath with quad/glute activation. EOS pt adjusted to chair position and set up with tray table and call bell. Pt making good progress and highly motivated. Will continue to assess pt's potential for higher intensity rehab setting and update recs as appropriate.   If plan is discharge home, recommend the following: Two people to help with walking and/or transfers;Help with stairs or ramp for entrance;A lot of help with bathing/dressing/bathroom;Assist for transportation;Assistance with cooking/housework   Can travel by private vehicle     No  Equipment Recommendations  None recommended by PT    Recommendations for Other Services       Precautions / Restrictions Precautions Precautions: Fall Precaution Comments: LE weakness, L knee pain, massive hard LE edema Restrictions Weight Bearing  Restrictions Per Provider Order: No     Mobility  Bed Mobility Overal bed mobility: Needs Assistance             General bed mobility comments: Tilt in KREG bed this visit. pt able to boost self superiorly with bed in trendelenburg using bil UE on head board rails. Start Time: 1340 Angle: 30 degrees (15, 30, 40) Total Minutes in Angle: 20 minutes (5 @15 *, 8 @30 *, 7@ 40*) Patient Response: Cooperative  Transfers                        Ambulation/Gait                   Stairs             Wheelchair Mobility     Tilt Bed Tilt Bed Patient on Tilt Bed?: Yes Start Time: 1340 Angle: 30 degrees (15, 30, 40) Total Minutes in Angle: 20 minutes (5 @15 *, 8 @30 *, 7@ 40*) Patient Response: Cooperative  Modified Rankin (Stroke Patients Only)       Balance Overall balance assessment: Needs assistance                                          Cognition Arousal: Alert Behavior During Therapy: WFL for tasks assessed/performed Overall Cognitive Status: Within Functional Limits for tasks assessed                                 General Comments: pt has  poor insight to deficits and safety with progression of mobility        Exercises Other Exercises Other Exercises: Mini squats performed at 3 different angles in tilt bed: 15* = 1x10 reps; 30* = 2x10 reps with 3 sec hold; 40*= 2x10 reps with 3 second holds Other Exercises: mini march at 30*, pt performing quad set on Rt/Lt LE and lifting heel&toe of contralateral LE, 2x5 reps bil Other Exercises: heel/toe raises at 40*, 2x 15 reps each Other Exercises: retrograde light manual massage bil LE's foot to knee to manage pain and edema. pt's LE's with hemosiderin stains and hardened indurated edema. (4 mins bil LE)    General Comments        Pertinent Vitals/Pain Pain Assessment Pain Assessment: Faces Faces Pain Scale: Hurts little more Pain Location: L knee and bilat  LEs Pain Descriptors / Indicators: Discomfort, Tightness, Aching Pain Intervention(s): Limited activity within patient's tolerance, Monitored during session, Repositioned, Other (comment) (manual soft tissue mobilization)    Home Living                          Prior Function            PT Goals (current goals can now be found in the care plan section) Acute Rehab PT Goals Patient Stated Goal: Hopeful to get home somehow, but needs rehab now PT Goal Formulation: With patient/family Time For Goal Achievement: 08/21/23 Potential to Achieve Goals: Fair Progress towards PT goals: Progressing toward goals    Frequency    Min 1X/week      PT Plan      Co-evaluation              AM-PAC PT "6 Clicks" Mobility   Outcome Measure  Help needed turning from your back to your side while in a flat bed without using bedrails?: A Little Help needed moving from lying on your back to sitting on the side of a flat bed without using bedrails?: A Lot Help needed moving to and from a bed to a chair (including a wheelchair)?: Total Help needed standing up from a chair using your arms (e.g., wheelchair or bedside chair)?: Total Help needed to walk in hospital room?: Total Help needed climbing 3-5 steps with a railing? : Total 6 Click Score: 9    End of Session Equipment Utilized During Treatment: Oxygen Activity Tolerance: Patient tolerated treatment well Patient left: in bed;with call bell/phone within reach;Other (comment) (bed in chair position) Nurse Communication: Mobility status;Patient requests pain meds PT Visit Diagnosis: Muscle weakness (generalized) (M62.81);Other abnormalities of gait and mobility (R26.89)     Time: 9147-8295 PT Time Calculation (min) (ACUTE ONLY): 45 min  Charges:    $Therapeutic Exercise: 23-37 mins $Massage: 8-22 mins PT General Charges $$ ACUTE PT VISIT: 1 Visit                     Wynn Maudlin, DPT Acute Rehabilitation  Services Office [phone]  08/16/23 3:12 PM

## 2023-08-16 NOTE — Progress Notes (Signed)
  Inpatient Rehab Admissions Coordinator :  Per therapy discussion in their recommendations, patient was screened for CIR candidacy by Ottie Glazier RN MSN. Patient is not  at a level to tolerate the intensity required to pursue a CIR admit .Other options should continue to by pursued. Please contact me with any questions.  Ottie Glazier RN MSN Admissions Coordinator [phone]

## 2023-08-17 DIAGNOSIS — I5023 Acute on chronic systolic (congestive) heart failure: Secondary | ICD-10-CM | POA: Diagnosis not present

## 2023-08-17 DIAGNOSIS — I10 Essential (primary) hypertension: Secondary | ICD-10-CM | POA: Diagnosis not present

## 2023-08-17 DIAGNOSIS — N186 End stage renal disease: Secondary | ICD-10-CM | POA: Diagnosis not present

## 2023-08-17 DIAGNOSIS — E1065 Type 1 diabetes mellitus with hyperglycemia: Secondary | ICD-10-CM | POA: Diagnosis not present

## 2023-08-17 LAB — RENAL FUNCTION PANEL
Albumin: 2.6 g/dL — ABNORMAL LOW (ref 3.5–5.0)
Anion gap: 13 (ref 5–15)
BUN: 73 mg/dL — ABNORMAL HIGH (ref 6–20)
CO2: 28 mmol/L (ref 22–32)
Calcium: 9.7 mg/dL (ref 8.9–10.3)
Chloride: 91 mmol/L — ABNORMAL LOW (ref 98–111)
Creatinine, Ser: 5.25 mg/dL — ABNORMAL HIGH (ref 0.61–1.24)
GFR, Estimated: 14 mL/min — ABNORMAL LOW (ref >60)
Glucose, Bld: 180 mg/dL — ABNORMAL HIGH (ref 70–99)
Phosphorus: 5.9 mg/dL — ABNORMAL HIGH (ref 2.5–4.6)
Potassium: 4.4 mmol/L (ref 3.5–5.1)
Sodium: 132 mmol/L — ABNORMAL LOW (ref 135–145)

## 2023-08-17 LAB — GLUCOSE, CAPILLARY
Glucose-Capillary: 212 mg/dL — ABNORMAL HIGH (ref 70–99)
Glucose-Capillary: 128 mg/dL — ABNORMAL HIGH (ref 70–99)
Glucose-Capillary: 137 mg/dL — ABNORMAL HIGH (ref 70–99)
Glucose-Capillary: 152 mg/dL — ABNORMAL HIGH (ref 70–99)

## 2023-08-17 MED ORDER — ALUM & MAG HYDROXIDE-SIMETH 200-200-20 MG/5ML PO SUSP
15.0000 mL | Freq: Once | ORAL | Status: AC | PRN
  Administered 2023-08-18: 15 mL via ORAL
  Filled 2023-08-17: qty 30

## 2023-08-17 MED ORDER — OXYCODONE-ACETAMINOPHEN 5-325 MG PO TABS
1.0000 | ORAL_TABLET | Freq: Four times a day (QID) | ORAL | Status: DC | PRN
  Administered 2023-08-18 – 2023-08-26 (×20): 1 via ORAL
  Filled 2023-08-17 (×22): qty 1

## 2023-08-17 MED ORDER — CYCLOBENZAPRINE HCL 5 MG PO TABS
5.0000 mg | ORAL_TABLET | Freq: Two times a day (BID) | ORAL | Status: DC | PRN
  Administered 2023-08-18 – 2023-08-24 (×5): 5 mg via ORAL
  Filled 2023-08-17 (×6): qty 1

## 2023-08-17 MED ORDER — HYDROMORPHONE HCL 1 MG/ML IJ SOLN
0.5000 mg | Freq: Three times a day (TID) | INTRAMUSCULAR | Status: DC | PRN
  Administered 2023-08-17 – 2023-08-26 (×21): 0.5 mg via INTRAVENOUS
  Filled 2023-08-17 (×22): qty 0.5

## 2023-08-17 NOTE — Progress Notes (Signed)
Progress Note   Patient: Bobby [name] WUJ:[id number] DOB: 1995-10-12 DOA: 08/04/2023     13 DOS: the patient was seen and examined on 08/17/2023   Brief hospital course: Bobby [name] was admitted to the hospital with the working diagnosis of volume overload in the setting of ESRD.   27 year old male with history of ESRD on dialysis on TTS schedule, morbid obesity, systolic CHF, diabetes type 1, recurrent ascites requiring paracentesis, HTN, anxiety who presented with myalgia, shortness of breath, abdominal distention, lower extremity edema.  He has been not compliant with outpatient hemodialysis and outpatient paracentesis.   He was found to be severely volume overloaded on presentation and on acute respiratory failure with hypoxia and hypercarbia secondary to pulmonary edema.  His blood pressure was 147/75, HT 90, RR 23 and 02 saturation 91% on supplemental 02 per , lungs with bilateral rales with no wheezing, heart with S1 and S2 present and regular, abdomen non tender, positive ascites, positive lower extremity edema.   Na 142, K 5.9 Cl 97, bicarbonate 32, glucose 132 bun 66 cr 9,5  AST 43 ALT 35 total bilirubin 1.5  Wbc 5.4 hgb 8,9 plt 231  Sars covid 19 negative   Chest radiograph with cardiomegaly with diffuse extensive interstitial infiltrates bilaterally (homogenous), with bilateral pleural effusions.   12/11 left knee aspiration and steroid injection.   Nephrology consulted  for serial dialysis.   Patient is from SNF.  Patient has persistent weakness in bilateral lower extremities, imagings did not support any fracture or dislocation. PT/OT consulted , recommendation is to go back to SNF.      Assessment and Plan: ESRD on dialysis (HCC) Volume overload. Hyponatremia,   BUN today is 73, K 4.4 Na 132 and bicarbonate 28   Patient having HD with good toleration, aggressive ultrafiltration for volume removal.  Fluid restriction.  He is not yet back to his dry weight.    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   Acute metabolic encephalopathy, he has been somnolent but easy to arouse.  Will decrease frequency and dose of opiate analgesics.  Decrease dose of cyclobenzaprine and discontinue gabapentin.  Continue neuro checks per unit protocol.  Patient very debilitated.  Essential hypertension Continue blood pressure control with hydralazine/ isosorbide, carvedilol and entresto   Cirrhosis (HCC) Sp paracentesis, 12/02 with 3 L removed.   Acute on chronic HFrEF (heart failure with reduced ejection fraction) (HCC) Echocardiogram with preserved LV systolic function. Continue ultrafiltration with hemodialysis  Continue blood pressure monitoring   Continue with carvedilol and entresto   Knee pain Bilateral knee pain and back pain, with ambulatory dysfunction. Pelvic fracture has been ruled out.  12/11 left knee aspiration and injection.   Positive ambulatory dysfunction.   Uncontrolled type 1 diabetes mellitus with hyperglycemia, with long-term current use of insulin (HCC) Continue insulin sliding scale for glucose cover and monitoring. Continue basal insulin.  Capillary glucose 180, 212, 128 and 137  Fasting glucose 180 mg/dl.         Subjective: Patient somnolent, reports continue to have dyspnea and has mild improvement in knee pain   Physical Exam: Vitals:   08/17/23 0501 08/17/23 0531 08/17/23 0746 08/17/23 1528  BP: (!) 153/125 (!) 144/73 138/69 139/78  Pulse: 83 85 86 85  Resp: 15 16 18 19   Temp:  97.7 F (36.5 C) 98.4 F (36.9 C) 97.7 F (36.5 C)  TempSrc:  Oral Oral Oral  SpO2: 100% 100% 99% 97%  Weight:  (!) 158.3 kg    Height:       Neurology somnolent but easy to arouse, responds to simple questions.  ENT with mild pallor with no icterus Cardiovascular with S1 and S2 present and regular with no gallops rubs or murmurs Respiratory with rales bilaterally with  scattered rhonchi on anterior auscultation  Abdomen distended but not tender Positive lower extremity edema ++  Data Reviewed:    Family Communication: no family at the bedside   Disposition: Status is: Inpatient Remains inpatient appropriate because: volume overload   Planned Discharge Destination: Skilled nursing facility      Author: Coralie Keens, MD 08/17/2023 4:47 PM  For on call review [email or web address].

## 2023-08-17 NOTE — Progress Notes (Signed)
OT Cancellation Note  Patient Details Name: Bobby [name] MRN: [id number] DOB: 1996-03-16   Cancelled Treatment:    Reason Eval/Treat Not Completed: Patient declined, no reason specified Pt declined to participate in OT session this AM; requested therapy to check back in afternoon if possible. Will follow up as schedule permits.  Lorre Munroe 08/17/2023, 9:32 AM

## 2023-08-17 NOTE — Plan of Care (Signed)
  Problem: Education: Goal: Knowledge of General Education information will improve Description: Including pain rating scale, medication(s)/side effects and non-pharmacologic comfort measures Outcome: Progressing   Problem: Health Behavior/Discharge Planning: Goal: Ability to manage health-related needs will improve Outcome: Progressing   Problem: Clinical Measurements: Goal: Ability to maintain clinical measurements within normal limits will improve Outcome: Progressing Goal: Will remain free from infection Outcome: Progressing Goal: Diagnostic test results will improve Outcome: Progressing Goal: Respiratory complications will improve Outcome: Progressing Goal: Cardiovascular complication will be avoided Outcome: Progressing   Problem: Activity: Goal: Risk for activity intolerance will decrease Outcome: Progressing   Problem: Nutrition: Goal: Adequate nutrition will be maintained Outcome: Progressing   Problem: Coping: Goal: Level of anxiety will decrease Outcome: Progressing   Problem: Elimination: Goal: Will not experience complications related to bowel motility Outcome: Progressing   Problem: Pain Management: Goal: General experience of comfort will improve Outcome: Progressing   Problem: Safety: Goal: Ability to remain free from injury will improve Outcome: Progressing   Problem: Skin Integrity: Goal: Risk for impaired skin integrity will decrease Outcome: Progressing   Problem: Education: Goal: Ability to describe self-care measures that may prevent or decrease complications (Diabetes Survival Skills Education) will improve Outcome: Progressing Goal: Individualized Educational Video(s) Outcome: Progressing   Problem: Coping: Goal: Ability to adjust to condition or change in health will improve Outcome: Progressing   Problem: Fluid Volume: Goal: Ability to maintain a balanced intake and output will improve Outcome: Progressing   Problem: Health  Behavior/Discharge Planning: Goal: Ability to identify and utilize available resources and services will improve Outcome: Progressing Goal: Ability to manage health-related needs will improve Outcome: Progressing   Problem: Metabolic: Goal: Ability to maintain appropriate glucose levels will improve Outcome: Progressing   Problem: Nutritional: Goal: Progress toward achieving an optimal weight will improve Outcome: Progressing   Problem: Tissue Perfusion: Goal: Adequacy of tissue perfusion will improve Outcome: Progressing

## 2023-08-17 NOTE — Care Management Important Message (Signed)
Important Message  Patient Details  Name: Bobby [name] MRN: [id number] Date of Birth: 1996-07-21   Important Message Given:  Yes - Medicare IM     Renie Ora 08/17/2023, 9:47 AM

## 2023-08-17 NOTE — Progress Notes (Signed)
Mobility Specialist Progress Note:    08/17/23 1152  Mobility  Activity Moved into chair position in bed  Level of Assistance Maximum assist, patient does 25-49% (+2)  Activity Response Tolerated well  Mobility Referral Yes  Mobility visit 1 Mobility  Mobility Specialist Start Time (ACUTE ONLY) 1130  Mobility Specialist Stop Time (ACUTE ONLY) 1145  Mobility Specialist Time Calculation (min) (ACUTE ONLY) 15 min   Pt received sitting at EOB, NT requesting assistance getting patient back to bed. Pt was able to scoot hip back in bed a little before mobility specialist had to swing the legs up. Pt instructed to pull self up using the bed rails while mobility specialist and NT pulled him up with the bed pad. Situated in bed w/ call bell and personal belongings in reach. NP in room.   Thompson Grayer Mobility Specialist  Please contact vis Secure Chat or  Rehab Office [phone]

## 2023-08-17 NOTE — Progress Notes (Signed)
[facility] Progress Note   Subjective: Seen in room. Asking when he will have HD. HD 08/18/2023 on schedule     Objective Vitals:   08/17/23 0431 08/17/23 0501 08/17/23 0531 08/17/23 0746  BP: (!) 141/79 (!) 153/125 (!) 144/73 138/69  Pulse: 87 83 85 86  Resp: 14 15 16 18   Temp:   97.7 F (36.5 C) 98.4 F (36.9 C)  TempSrc:   Oral Oral  SpO2: 93% 100% 100% 99%  Weight:   (!) 158.3 kg   Height:       Physical Exam General: Chronically ill appearing male in NAD Heart: S1,S2 RRR No M/R/G Lungs: Bilateral breath sounds decreased in bases otherwise CTAB Abdomen:obese NABS Extremities: 2+ BLE pitting edema Dialysis Access: AVF + T/B   Additional Objective Labs: Basic Metabolic Panel: Recent Labs  Lab 08/15/23 0245 08/16/23 0224 08/17/23 0824  NA 134* 133* 132*  K 4.5 4.6 4.4  CL 92* 91* 91*  CO2 31 30 28   GLUCOSE 212* 244* 180*  BUN 80* 75* 73*  CREATININE 6.54* 5.82* 5.25*  CALCIUM 9.5 9.5 9.7  PHOS 7.0* 6.4* 5.9*   Liver Function Tests: Recent Labs  Lab 08/15/23 0245 08/16/23 0224 08/17/23 0824  ALBUMIN 2.4* 2.6* 2.6*   No results for input(s): "LIPASE", "AMYLASE" in the last 168 hours. CBC: Recent Labs  Lab 08/12/23 0843 08/13/23 0247 08/13/23 1101 08/14/23 0741 08/15/23 0245  WBC 4.5 6.0 5.5 6.1 5.7  NEUTROABS 3.8 5.1  --  5.3 4.2  HGB 9.7* 10.0* 9.7* 9.6* 9.9*  HCT 32.8* 33.4* 32.9* 32.3* 32.6*  MCV 91.1 92.0 92.2 91.5 91.6  PLT 349 393 355 385 391   Blood Culture    Component Value Date/Time   SDES BLOOD BLOOD RIGHT ARM 08/05/2023 0236   SPECREQUEST  08/05/2023 0236    BOTTLES DRAWN AEROBIC AND ANAEROBIC Blood Culture adequate volume   CULT  08/05/2023 0236    NO GROWTH 5 DAYS Performed at [facility], [address], [place], [place] [address]    REPTSTATUS 08/10/2023 FINAL 08/05/2023 0236    Cardiac Enzymes: No results for input(s): "CKTOTAL", "CKMB", "CKMBINDEX", "TROPONINI" in the last 168  hours. CBG: Recent Labs  Lab 08/16/23 1121 08/16/23 1653 08/16/23 2229 08/17/23 0612 08/17/23 1141  GLUCAP 130* 212* 180* 212* 128*   Iron Studies: No results for input(s): "IRON", "TIBC", "TRANSFERRIN", "FERRITIN" in the last 72 hours. @lablastinr3 @ Studies/Results: No results found. Medications:   bupivacaine(PF)  10 mL Infiltration Once   carvedilol  25 mg Oral BID WC   Chlorhexidine Gluconate Cloth  6 each Topical Q0600   Chlorhexidine Gluconate Cloth  6 each Topical Q0600   darbepoetin (ARANESP) injection - DIALYSIS  200 mcg Subcutaneous Q Mon-1800   feeding supplement (NEPRO CARB STEADY)  237 mL Oral BID BM   gabapentin  300 mg Oral BID   heparin  5,000 Units Subcutaneous Q8H   hydrALAZINE  100 mg Oral TID   insulin aspart  0-15 Units Subcutaneous TID WC   insulin aspart  0-5 Units Subcutaneous QHS   insulin aspart  3 Units Subcutaneous TID WC   insulin glargine-yfgn  5 Units Subcutaneous Daily   isosorbide mononitrate  90 mg Oral Daily   loratadine  10 mg Oral Daily   methylPREDNISolone acetate  40 mg Intra-articular Once   polyethylene glycol  17 g Oral Daily   sacubitril-valsartan  1 tablet Oral BID   sucroferric oxyhydroxide  1,000 mg Oral TID  WC     Dialysis Orders: HP TTS 4:15 500/A2x EDW 112 kg 2K/2Ca AVF  Heparin 65784 Units IV TIW Last HD 11/28 - post HD wt 146 kg  Mircera 200 q 2 weeks (last 11/19) Venofer 100 x 10  Hectorol 8 q TIW   Assessment/Plan: #Profound volume overload - Chronic issue.  > 30 kg over EDW on presentation.  Received serial HD treatment. Remains extremely volume overloaded. Needs more aggressive UF goals, tolerated 6L on 08/15/23, continue high UF goals. Continue volume restriction (reinforced again today) and extra HD as schedule allows.   #ESRD -  HD TTS as OP via AVF. Serial HD as above.   #Hyperkalemia - has corrected with HD  #Hypertension/volume  - BP acceptable. Massive volume overload as above  #Anemia  -  on ESA-  continue here.  Hgb 9.9. Likely a component of hemodilution too.   #CKD-MBD/hyperphosphatemia-Phos elevated. currently on Velphoro, added 2 tablet with snacks as well.  Follow labs.  Calcium borderline, not currently on VDRA  #Recurrent ascites -  s/p paracentesis 12/2 of 3 liters.  Further paracentesis deferred to primary team.  #Nutrition - Renal diet with fluid restriction  # Pain presumably due to edema and fluid overload.   CT scan of pelvis with no fracture.  Continue supportive care, started empiric steroids by primary team because of increased inflammatory marker.    Dorenda Pfannenstiel H. Chrystel Barefield NP-C 08/17/2023, 2:56 PM  [employer] [phone]

## 2023-08-17 NOTE — Progress Notes (Signed)
Occupational Therapy Treatment Patient Details Name: Bobby [name] MRN: [id number] DOB: 25-Sep-1995 Today's Date: 08/17/2023   History of present illness Pt is 27 yo presenting to [facility] ED with diffuse muscle aches after missing HD session on Tuesday. Also reporting dyspnea on exertion, worsening abdominal swelling and leg swelling. Pt was able to get HD on Thursday with 6L removed. PMH includes recurrent ascites requiring paracentesis, ESRD (HD TTS), HFrEF, type 1 diabetes, HTN, obesity.   OT comments  Despite pain premedication, pt continues to report B knee pain as limiting factors in mobility. Provided manual massage to B knees and LLE AAROM per pt request. Pt able to assist with bed mobility at Min-Mod A and attempt brief standing trials with bariatric RW though pt unable to sustain full upright posture. Encouraged UE HEP, LE exercises bed level and family assist with exercises/massage outside of therapy sessions as well. Continue to recommend postacute rehab stay unless family able to provide bed level ADL assist/hoyer lift transfers at home.       If plan is discharge home, recommend the following:  Two people to help with walking and/or transfers;Two people to help with bathing/dressing/bathroom;Assistance with cooking/housework;Assist for transportation;Help with stairs or ramp for entrance   Equipment Recommendations  Wheelchair cushion (measurements OT);Wheelchair (measurements OT);Hospital bed;Hoyer lift    Recommendations for Other Services      Precautions / Restrictions Precautions Precautions: Fall Precaution Comments: LE weakness, L knee pain, massive hard LE edema Restrictions Weight Bearing Restrictions Per Provider Order: No       Mobility Bed Mobility Overal bed mobility: Needs Assistance Bed Mobility: Supine to Sit, Sit to Supine     Supine to sit: Min assist, HOB elevated, Used rails Sit to supine: Mod assist   General bed mobility comments: able to use  bedrail with HOB elevated to sit EOB w/ light assist to steady trunk and scoot hips safely. Mod A for LE assist back to bed with trial of gait belt as leg lifter    Transfers Overall transfer level: Needs assistance Equipment used: Rolling walker (2 wheels) Transfers: Sit to/from Stand Sit to Stand: Mod assist, +2 physical assistance, +2 safety/equipment, From elevated surface           General transfer comment: Mod A x 2 for partial stand at bedside - able to lift bottom from bed but unable to activate muscles to tuck bottom in and stand fully upright     Balance Overall balance assessment: Needs assistance Sitting-balance support: Feet supported, Single extremity supported, Bilateral upper extremity supported Sitting balance-Leahy Scale: Fair     Standing balance support: Bilateral upper extremity supported, During functional activity Standing balance-Leahy Scale: Poor                             ADL either performed or assessed with clinical judgement   ADL Overall ADL's : Needs assistance/impaired                     Lower Body Dressing: Maximal assistance;Bed level Lower Body Dressing Details (indicate cue type and reason): sock assist bed level                    Extremity/Trunk Assessment Upper Extremity Assessment Upper Extremity Assessment: Overall WFL for tasks assessed;Right hand dominant   Lower Extremity Assessment Lower Extremity Assessment: Defer to PT evaluation        Vision   Vision Assessment?:  No apparent visual deficits   Perception     Praxis      Cognition Arousal: Alert Behavior During Therapy: WFL for tasks assessed/performed Overall Cognitive Status: Within Functional Limits for tasks assessed                                          Exercises Exercises: Other exercises Other Exercises Other Exercises: soft tissue retrograde massage B knees per pt request Other Exercises: AAROM L knee  flexion/extension and adduction    Shoulder Instructions       General Comments      Pertinent Vitals/ Pain       Pain Assessment Pain Assessment: Faces Faces Pain Scale: Hurts even more Pain Location: B knees Pain Descriptors / Indicators: Grimacing, Guarding, Sore Pain Intervention(s): Monitored during session, Premedicated before session  Home Living                                          Prior Functioning/Environment              Frequency  Min 1X/week        Progress Toward Goals  OT Goals(current goals can now be found in the care plan section)  Progress towards OT goals: OT to reassess next treatment  Acute Rehab OT Goals Patient Stated Goal: be able to walk and go home OT Goal Formulation: With patient Time For Goal Achievement: 08/22/23 Potential to Achieve Goals: Fair ADL Goals Pt Will Perform Lower Body Dressing: with min assist;sitting/lateral leans Pt Will Transfer to Toilet: with max assist;with +2 assist;stand pivot transfer;with transfer board Pt/caregiver will Perform Home Exercise Program: Both right and left upper extremity;With written HEP provided  Plan      Co-evaluation    PT/OT/SLP Co-Evaluation/Treatment: Yes Reason for Co-Treatment: For patient/therapist safety;To address functional/ADL transfers   OT goals addressed during session: ADL's and self-care;Proper use of Adaptive equipment and DME      AM-PAC OT "6 Clicks" Daily Activity     Outcome Measure   Help from another person eating meals?: None Help from another person taking care of personal grooming?: A Little Help from another person toileting, which includes using toliet, bedpan, or urinal?: Total Help from another person bathing (including washing, rinsing, drying)?: A Lot Help from another person to put on and taking off regular upper body clothing?: A Little Help from another person to put on and taking off regular lower body clothing?:  Total 6 Click Score: 14    End of Session Equipment Utilized During Treatment: Gait belt;Rolling walker (2 wheels)  OT Visit Diagnosis: Unsteadiness on feet (R26.81);Muscle weakness (generalized) (M62.81);Other abnormalities of gait and mobility (R26.89);Pain Pain - Right/Left: Left Pain - part of body: Knee   Activity Tolerance Patient limited by pain   Patient Left in bed;with call bell/phone within reach   Nurse Communication Mobility status        Time: 4098-1191 OT Time Calculation (min): 44 min  Charges: OT General Charges $OT Visit: 1 Visit OT Treatments $Therapeutic Activity: 8-22 mins $Therapeutic Exercise: 8-22 mins  Bradd Canary, OTR/L Acute Rehab Services Office: [phone]   Lorre Munroe 08/17/2023, 2:20 PM

## 2023-08-17 NOTE — Progress Notes (Signed)
Physical Therapy Treatment Patient Details Name: Bobby [name] MRN: [id number] DOB: 1996-02-11 Today's Date: 08/17/2023   History of Present Illness Pt is 27 yo presenting to [facility] ED with diffuse muscle aches after missing HD session on Tuesday. Also reporting dyspnea on exertion, worsening abdominal swelling and leg swelling. He was found to be severely volume overloaded on presentation with acute respiratory failure with hypoxia and hypercarbia secondary to pulmonary edema.  Lab work showed hyperkalemia.  Nephrology consulted for serial dialysis.  Patient is from SNF. Patient has persistent weakness in bilateral lower extremities, imagings did not support any fracture or dislocation and LE ultrasound negative for DVT. PMH includes recurrent ascites requiring paracentesis, ESRD (HD TTS), HFrEF (LVEF ~50%), type 1 diabetes, HTN, obesity.    PT Comments  Pt received in supine, agreeable to therapy session with encouragement (pt deferred therapies in AM and prefers afternoon session). Pt self-limiting due to BLE (L>R) knee pain and quick to fatigue, but agreeable to attempt standing from elevated bed<>bari RW. Pt performed partial stand/squat (~50% upright) from EOB with +2 modA, pt sitting back down without attempt to fully extend his trunk and c/o increased fatigue after standing trial. Pt needing up to min/modA for bed mobility with reliance on hospital bed features/rails and seated posterior/lateral scooting with +2 for safety. BP stable from supine to seated postures and SpO2 WFL on 3L O2 [place] with exertion, 2L O2 [place] at rest. Pt will continue to benefit from skilled rehab in a post acute setting < 3 hours/day to maximize functional gains before returning home.     If plan is discharge home, recommend the following: Two people to help with walking and/or transfers;Help with stairs or ramp for entrance;A lot of help with bathing/dressing/bathroom;Assist for transportation;Assistance with  cooking/housework   Can travel by private vehicle     No  Equipment Recommendations  None recommended by PT    Recommendations for Other Services       Precautions / Restrictions Precautions Precautions: Fall Precaution Comments: LE weakness, L knee pain, LE edema Restrictions Weight Bearing Restrictions Per Provider Order: No     Mobility  Bed Mobility Overal bed mobility: Needs Assistance Bed Mobility: Supine to Sit, Sit to Supine     Supine to sit: Min assist, HOB elevated, Used rails, +2 for safety/equipment Sit to supine: Mod assist, +2 for safety/equipment, Used rails, HOB elevated   General bed mobility comments: able to use bedrail with HOB elevated to sit EOB w/ light assist to steady trunk and scoot hips safely. Mod A for LE assist back to bed with trial of gait belt as leg lifter but pt reports this was not a less painful technique for him.    Transfers Overall transfer level: Needs assistance Equipment used: Rolling walker (2 wheels) Transfers: Sit to/from Stand Sit to Stand: Mod assist, +2 physical assistance, +2 safety/equipment, From elevated surface           General transfer comment: Mod A x 2 for partial stand at bedside - able to lift bottom 4-6" from bed but unable to fully extend hips/raise trunk, appears to be more due to fear of pain/discomfort as pt only needing min/modA +2 to reach partial stand. Hand over hand assist to reach for RW after pushing up off bed, L foot blocked on inside to prevent sliding for safety. RLE closer to him than LLE due to L knee pain. Bed height very elevated due to pt tall height and BLE pain.  Ambulation/Gait                   Stairs             Wheelchair Mobility     Tilt Bed    Modified Rankin (Stroke Patients Only)       Balance Overall balance assessment: Needs assistance Sitting-balance support: Feet supported, Single extremity supported, Bilateral upper extremity supported Sitting  balance-Leahy Scale: Fair     Standing balance support: Bilateral upper extremity supported, During functional activity Standing balance-Leahy Scale: Poor Standing balance comment: +2 assist and RW reliant, posterior lean; With bari RW and highest setting due to height.                            Cognition Arousal: Alert Behavior During Therapy: WFL for tasks assessed/performed Overall Cognitive Status: Within Functional Limits for tasks assessed                                 General Comments: Pt reluctant to progress due to pain/anticipation of pain and can be self-limiting.        Exercises General Exercises - Lower Extremity Ankle Circles/Pumps: AROM, Both, 5 reps, Supine Quad Sets: AROM, Both, 5 reps, Supine Hip ABduction/ADduction:  (pt instructed on pillow squeezes between his legs in supine to work on neutral hip posture, pt tends to LLE ER at rest due to L knee pain) Other Exercises Other Exercises: chair push-ups at EOB x4 reps to work on tricep strengthening and pre-transfer activity (pushing from mattress with arms)    General Comments General comments (skin integrity, edema, etc.): PT c/o mild dizziness initially sitting up but BP stable. SpO2 93% on 2L O2 [place] and increased to 3L/min per pt request for sitting/standing trial, SpO2 95% on 3L/min. BP 113/59 (75) HR 81 supine and BP 140/72 (89) sitting EOB.      Pertinent Vitals/Pain Pain Assessment Pain Assessment: Faces Faces Pain Scale: Hurts even more Pain Location: B knees Pain Descriptors / Indicators: Grimacing, Guarding, Sore Pain Intervention(s): Limited activity within patient's tolerance, Monitored during session, Premedicated before session, Repositioned     PT Goals (current goals can now be found in the care plan section) Acute Rehab PT Goals Patient Stated Goal: Hopeful to get home somehow, but needs rehab now PT Goal Formulation: With patient/family Time For Goal  Achievement: 08/21/23 Progress towards PT goals: Progressing toward goals    Frequency    Min 1X/week      PT Plan      Co-evaluation PT/OT/SLP Co-Evaluation/Treatment: Yes Reason for Co-Treatment: For patient/therapist safety;To address functional/ADL transfers PT goals addressed during session: Mobility/safety with mobility;Proper use of DME;Strengthening/ROM OT goals addressed during session: ADL's and self-care;Proper use of Adaptive equipment and DME      AM-PAC PT "6 Clicks" Mobility   Outcome Measure  Help needed turning from your back to your side while in a flat bed without using bedrails?: A Little Help needed moving from lying on your back to sitting on the side of a flat bed without using bedrails?: A Lot Help needed moving to and from a bed to a chair (including a wheelchair)?: Total Help needed standing up from a chair using your arms (e.g., wheelchair or bedside chair)?: Total Help needed to walk in hospital room?: Total Help needed climbing 3-5 steps with a railing? : Total 6 Click  Score: 9    End of Session Equipment Utilized During Treatment: Gait belt;Oxygen Activity Tolerance: Patient tolerated treatment well;Patient limited by pain;Patient limited by fatigue Patient left: in bed;with call bell/phone within reach;Other (comment) (OT still in his room) Nurse Communication: Mobility status;Need for lift equipment (currently hoyer level for OOB) PT Visit Diagnosis: Muscle weakness (generalized) (M62.81);Other abnormalities of gait and mobility (R26.89)     Time: 4098-1191 PT Time Calculation (min) (ACUTE ONLY): 29 min  Charges:    $Therapeutic Activity: 8-22 mins PT General Charges $$ ACUTE PT VISIT: 1 Visit                     Haunani Dickard P., PTA Acute Rehabilitation Services Secure Chat Preferred 9a-5:30pm Office: [phone]    Dorathy Kinsman [facility] 08/17/2023, 2:45 PM

## 2023-08-17 NOTE — Progress Notes (Addendum)
Contact Note: Pt calling out from room for assist, NT went to room to check on pt and PTA overhead pt requesting bed pan. Pt was sitting EOB and refusing PTA/NT to assist him with return to supine for safe placement of bed pan under him. Pt needed +2 modA and RW support for squatting safely at bedside earlier in the day from elevated bed height. Pt reports his plan is to perform lateral lean while sitting EOB to place bed pan and pt adamant he does not want assist due to privacy concerns. PTA stood behind curtain while pt states he got himself onto bed pan (per pt request that PTA not step past curtain for his privacy), and therapist left room per pt request after pt states he was safely on bed pan, pt did not allow PTA to put bed alarm on due to pt state of undress. RN/NT notified and aware, pt agreeable to press call bell once he is done toileting.  PTA IN 1758, OUT 1801

## 2023-08-18 DIAGNOSIS — I10 Essential (primary) hypertension: Secondary | ICD-10-CM | POA: Diagnosis not present

## 2023-08-18 DIAGNOSIS — I5023 Acute on chronic systolic (congestive) heart failure: Secondary | ICD-10-CM | POA: Diagnosis not present

## 2023-08-18 DIAGNOSIS — K7460 Unspecified cirrhosis of liver: Secondary | ICD-10-CM

## 2023-08-18 DIAGNOSIS — N186 End stage renal disease: Secondary | ICD-10-CM | POA: Diagnosis not present

## 2023-08-18 DIAGNOSIS — R188 Other ascites: Secondary | ICD-10-CM

## 2023-08-18 DIAGNOSIS — M25562 Pain in left knee: Secondary | ICD-10-CM

## 2023-08-18 LAB — RENAL FUNCTION PANEL
Albumin: 2.5 g/dL — ABNORMAL LOW (ref 3.5–5.0)
Anion gap: 17 — ABNORMAL HIGH (ref 5–15)
BUN: 104 mg/dL — ABNORMAL HIGH (ref 6–20)
CO2: 25 mmol/L (ref 22–32)
Calcium: 9.6 mg/dL (ref 8.9–10.3)
Chloride: 90 mmol/L — ABNORMAL LOW (ref 98–111)
Creatinine, Ser: 6.63 mg/dL — ABNORMAL HIGH (ref 0.61–1.24)
GFR, Estimated: 11 mL/min — ABNORMAL LOW (ref >60)
Glucose, Bld: 187 mg/dL — ABNORMAL HIGH (ref 70–99)
Phosphorus: 7 mg/dL — ABNORMAL HIGH (ref 2.5–4.6)
Potassium: 5.3 mmol/L — ABNORMAL HIGH (ref 3.5–5.1)
Sodium: 132 mmol/L — ABNORMAL LOW (ref 135–145)

## 2023-08-18 LAB — CBC
HCT: 32.1 % — ABNORMAL LOW (ref 39.0–52.0)
Hemoglobin: 9.7 g/dL — ABNORMAL LOW (ref 13.0–17.0)
MCH: 27.7 pg (ref 26.0–34.0)
MCHC: 30.2 g/dL (ref 30.0–36.0)
MCV: 91.7 fL (ref 80.0–100.0)
Platelets: 362 10*3/uL (ref 150–400)
RBC: 3.5 MIL/uL — ABNORMAL LOW (ref 4.22–5.81)
RDW: 18.1 % — ABNORMAL HIGH (ref 11.5–15.5)
WBC: 6.7 10*3/uL (ref 4.0–10.5)
nRBC: 0 % (ref 0.0–0.2)

## 2023-08-18 LAB — GLUCOSE, CAPILLARY
Glucose-Capillary: 196 mg/dL — ABNORMAL HIGH (ref 70–99)
Glucose-Capillary: 131 mg/dL — ABNORMAL HIGH (ref 70–99)
Glucose-Capillary: 116 mg/dL — ABNORMAL HIGH (ref 70–99)
Glucose-Capillary: 195 mg/dL — ABNORMAL HIGH (ref 70–99)

## 2023-08-18 MED ORDER — HEPARIN SODIUM (PORCINE) 1000 UNIT/ML IJ SOLN
10000.0000 [IU] | Freq: Once | INTRAMUSCULAR | Status: AC
  Administered 2023-08-18: 10000 [IU]
  Filled 2023-08-18: qty 10

## 2023-08-18 NOTE — Progress Notes (Addendum)
Progress Note   Patient: Bobby [name] NWG:[id number] DOB: [birth year] DOA: 08/04/2023     14 DOS: the patient was seen and examined on 08/18/2023   Brief hospital course: Mr. Pitzen was admitted to the hospital with the working diagnosis of volume overload in the setting of ESRD.   27 year old male with history of ESRD on dialysis on TTS schedule, morbid obesity, systolic CHF, diabetes type 1, recurrent ascites requiring paracentesis, HTN, anxiety who presented with myalgia, shortness of breath, abdominal distention, lower extremity edema.  He has been not compliant with outpatient hemodialysis and outpatient paracentesis.   He was found to be severely volume overloaded on presentation and on acute respiratory failure with hypoxia and hypercarbia secondary to pulmonary edema.  His blood pressure was 147/75, HT 90, RR 23 and 02 saturation 91% on supplemental 02 per [place], lungs with bilateral rales with no wheezing, heart with S1 and S2 present and regular, abdomen non tender, positive ascites, positive lower extremity edema.   Na 142, K 5.9 Cl 97, bicarbonate 32, glucose 132 bun 66 cr 9,5  AST 43 ALT 35 total bilirubin 1.5  Wbc 5.4 hgb 8,9 plt 231  Sars covid 19 negative   Chest radiograph with cardiomegaly with diffuse extensive interstitial infiltrates bilaterally (homogenous), with bilateral pleural effusions.   12/11 left knee aspiration and steroid injection.   Nephrology consulted  for serial dialysis.   Patient is from SNF.  Patient has persistent weakness in bilateral lower extremities, imagings did not support any fracture or dislocation. PT/OT consulted , recommendation is to go back to SNF.    12/14 volume status is improving with ultrafiltration, plan to return to SNF when euvolemic state.   Assessment and Plan: ESRD on dialysis (HCC) Volume overload. Hyponatremia,   Patient having HD with good toleration, aggressive ultrafiltration for volume removal.  Fluid  restriction.  He is not yet back to his dry weight.  Continue to follow up nephrology recommendations regards of inpatient renal replacement therapy.   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   Acute metabolic encephalopathy, Decreased frequency and dose of opiate analgesics.  Decreased dose of cyclobenzaprine and discontinue gabapentin, with improvement in his mentation.   Continue neuro checks per unit protocol.  Patient very debilitated.  Essential hypertension Continue blood pressure control with hydralazine/ isosorbide, carvedilol and entresto   Cirrhosis (HCC) Sp paracentesis, 12/02 with 3 L removed.   Acute on chronic HFrEF (heart failure with reduced ejection fraction) (HCC) Echocardiogram 06/2023 (care everywhere) preserved LV systolic function with EF 55 to 60%, mild LVH, possible loculated inferior small size pericardial effusion, RV with normal size, and normal systolic function, LA and RA with normal size, no significant valvular disease.    Continue ultrafiltration with hemodialysis  Continue blood pressure monitoring   Continue with carvedilol and entresto Ok to discontinue telemetry.   Knee pain Bilateral knee pain and back pain, with ambulatory dysfunction. Pelvic fracture has been ruled out.  12/11 left knee aspiration and injection.   Positive ambulatory dysfunction.   Uncontrolled type 1 diabetes mellitus with hyperglycemia, with long-term current use of insulin (HCC) Continue insulin sliding scale for glucose cover and monitoring. Continue basal insulin.  Capillary glucose 128, 137., 152, 196         Subjective: Patient is more awake and alert, dyspnea and knee pain have improved, he continue very weak and deconditioned   Physical Exam: Vitals:   08/18/23 0027 08/18/23 0345 08/18/23  6213 08/18/23 0931  BP: (!) 161/75 (!) 142/77 (!) 101/37 114/70  Pulse: 85 84  86  Resp: 19 19 19    Temp: 97.7  F (36.5 C) 97.7 F (36.5 C) 98.3 F (36.8 C)   TempSrc: Oral Oral Oral   SpO2: 100% 98%  98%  Weight:  (!) 160 kg    Height:       Neurology awake and alert ENT with mild pallor Cardiovascular with S1 and S2 present and regular with no gallops, rubs or murmurs Respiratory with no rales or wheezing, no rhonchi Abdomen with no distention  Positive lower extremity edema ++ Data Reviewed:    Family Communication: no family at the bedside   Disposition: Status is: Inpatient Remains inpatient appropriate because: inpatient renal replacement therapy   Planned Discharge Destination: Home      Author: Coralie Keens, MD 08/18/2023 10:33 AM  For on call review [email or web address].

## 2023-08-18 NOTE — Progress Notes (Signed)
[facility] Progress Note   Subjective: Seen in room. [employer] pizza box on bedside table. Reminded to adhere to low sodium diet. HD later this evening. Ascites appears to be accumulating again. Will order paracentesis for Monday.      Objective Vitals:   08/18/23 0345 08/18/23 0929 08/18/23 0931 08/18/23 1135  BP: (!) 142/77 (!) 101/37 114/70 137/71  Pulse: 84  86 87  Resp: 19 19  20   Temp: 97.7 F (36.5 C) 98.3 F (36.8 C)  97.8 F (36.6 C)  TempSrc: Oral Oral  Oral  SpO2: 98%  98% 97%  Weight: (!) 160 kg     Height:       Physical Exam General: Chronically ill appearing male in NAD Heart: S1,S2 RRR No M/R/G Lungs: Bilateral breath sounds decreased in bases otherwise CTAB Abdomen: Taut, distended, probable ascites present. NABS.  Extremities: 2+ BLE pitting edema Dialysis Access: AVF + T/B  Additional Objective Labs: Basic Metabolic Panel: Recent Labs  Lab 08/15/23 0245 08/16/23 0224 08/17/23 0824  NA 134* 133* 132*  K 4.5 4.6 4.4  CL 92* 91* 91*  CO2 31 30 28   GLUCOSE 212* 244* 180*  BUN 80* 75* 73*  CREATININE 6.54* 5.82* 5.25*  CALCIUM 9.5 9.5 9.7  PHOS 7.0* 6.4* 5.9*   Liver Function Tests: Recent Labs  Lab 08/15/23 0245 08/16/23 0224 08/17/23 0824  ALBUMIN 2.4* 2.6* 2.6*   No results for input(s): "LIPASE", "AMYLASE" in the last 168 hours. CBC: Recent Labs  Lab 08/12/23 0843 08/13/23 0247 08/13/23 1101 08/14/23 0741 08/15/23 0245  WBC 4.5 6.0 5.5 6.1 5.7  NEUTROABS 3.8 5.1  --  5.3 4.2  HGB 9.7* 10.0* 9.7* 9.6* 9.9*  HCT 32.8* 33.4* 32.9* 32.3* 32.6*  MCV 91.1 92.0 92.2 91.5 91.6  PLT 349 393 355 385 391   Blood Culture    Component Value Date/Time   SDES BLOOD BLOOD RIGHT ARM 08/05/2023 0236   SPECREQUEST  08/05/2023 0236    BOTTLES DRAWN AEROBIC AND ANAEROBIC Blood Culture adequate volume   CULT  08/05/2023 0236    NO GROWTH 5 DAYS Performed at [facility], [address] [place], [place] [address]     REPTSTATUS 08/10/2023 FINAL 08/05/2023 0236    Cardiac Enzymes: No results for input(s): "CKTOTAL", "CKMB", "CKMBINDEX", "TROPONINI" in the last 168 hours. CBG: Recent Labs  Lab 08/17/23 1141 08/17/23 1631 08/17/23 2139 08/18/23 0558 08/18/23 1116  GLUCAP 128* 137* 152* 196* 131*   Iron Studies: No results for input(s): "IRON", "TIBC", "TRANSFERRIN", "FERRITIN" in the last 72 hours. @lablastinr3 @ Studies/Results: No results found. Medications:   bupivacaine(PF)  10 mL Infiltration Once   carvedilol  25 mg Oral BID WC   Chlorhexidine Gluconate Cloth  6 each Topical Q0600   Chlorhexidine Gluconate Cloth  6 each Topical Q0600   darbepoetin (ARANESP) injection - DIALYSIS  200 mcg Subcutaneous Q Mon-1800   feeding supplement (NEPRO CARB STEADY)  237 mL Oral BID BM   heparin  5,000 Units Subcutaneous Q8H   hydrALAZINE  100 mg Oral TID   insulin aspart  0-15 Units Subcutaneous TID WC   insulin aspart  0-5 Units Subcutaneous QHS   insulin aspart  3 Units Subcutaneous TID WC   insulin glargine-yfgn  5 Units Subcutaneous Daily   isosorbide mononitrate  90 mg Oral Daily   loratadine  10 mg Oral Daily   methylPREDNISolone acetate  40 mg Intra-articular Once   polyethylene glycol  17 g Oral  Daily   sacubitril-valsartan  1 tablet Oral BID   sucroferric oxyhydroxide  1,000 mg Oral TID WC     Dialysis Orders: HP TTS 4:15 500/A2x EDW 112 kg 2K/2Ca AVF  Heparin 78295 Units IV TIW Last HD 11/28 - post HD wt 146 kg  Mircera 200 q 2 weeks (last 11/19) Venofer 100 x 10  Hectorol 8 q TIW   Assessment/Plan: #Profound volume overload - Chronic issue.  > 30 kg over EDW on presentation.  Received serial HD treatment. Remains extremely volume overloaded. Needs more aggressive UF goals, tolerated 6L on 08/15/23, continue high UF goals. Continue volume restriction (reinforced again today) and extra HD as schedule allows.    #ESRD -  HD TTS as OP via AVF. Serial HD as above. Next HD  08/18/2023   #Hyperkalemia - has corrected with HD   #Hypertension/volume  - BP acceptable. Massive volume overload as above   #Anemia  -  on ESA- continue here.  Hgb 9.9. Likely a component of hemodilution too.    #CKD-MBD/hyperphosphatemia-Phos elevated. currently on Velphoro, added 2 tablet with snacks as well.  Follow labs.  Calcium borderline, not currently on VDRA   #Recurrent ascites -  s/p paracentesis 12/2 of 3 liters.  Further paracentesis deferred to primary team.   #Nutrition - Renal diet with fluid restriction   # Pain presumably due to edema and fluid overload.   CT scan of pelvis with no fracture.  Continue supportive care, started empiric steroids by primary team because of increased inflammatory marker.    Myanna Ziesmer H. Teara Duerksen NP-C 08/18/2023, 2:57 PM  [employer] [phone]

## 2023-08-19 DIAGNOSIS — K7460 Unspecified cirrhosis of liver: Secondary | ICD-10-CM | POA: Diagnosis not present

## 2023-08-19 DIAGNOSIS — N186 End stage renal disease: Secondary | ICD-10-CM | POA: Diagnosis not present

## 2023-08-19 DIAGNOSIS — I10 Essential (primary) hypertension: Secondary | ICD-10-CM | POA: Diagnosis not present

## 2023-08-19 DIAGNOSIS — I5023 Acute on chronic systolic (congestive) heart failure: Secondary | ICD-10-CM | POA: Diagnosis not present

## 2023-08-19 LAB — RENAL FUNCTION PANEL
Albumin: 2.7 g/dL — ABNORMAL LOW (ref 3.5–5.0)
Anion gap: 12 (ref 5–15)
BUN: 80 mg/dL — ABNORMAL HIGH (ref 6–20)
CO2: 26 mmol/L (ref 22–32)
Calcium: 9.4 mg/dL (ref 8.9–10.3)
Chloride: 92 mmol/L — ABNORMAL LOW (ref 98–111)
Creatinine, Ser: 5.12 mg/dL — ABNORMAL HIGH (ref 0.61–1.24)
GFR, Estimated: 15 mL/min — ABNORMAL LOW (ref >60)
Glucose, Bld: 166 mg/dL — ABNORMAL HIGH (ref 70–99)
Phosphorus: 5.8 mg/dL — ABNORMAL HIGH (ref 2.5–4.6)
Potassium: 4.4 mmol/L (ref 3.5–5.1)
Sodium: 130 mmol/L — ABNORMAL LOW (ref 135–145)

## 2023-08-19 LAB — GLUCOSE, CAPILLARY
Glucose-Capillary: 165 mg/dL — ABNORMAL HIGH (ref 70–99)
Glucose-Capillary: 153 mg/dL — ABNORMAL HIGH (ref 70–99)
Glucose-Capillary: 156 mg/dL — ABNORMAL HIGH (ref 70–99)
Glucose-Capillary: 157 mg/dL — ABNORMAL HIGH (ref 70–99)

## 2023-08-19 MED ORDER — POLYVINYL ALCOHOL 1.4 % OP SOLN: 1.0000 [drp] | OPHTHALMIC | Status: DC | PRN

## 2023-08-19 NOTE — Progress Notes (Signed)
Progress Note   Patient: Bobby [name] YQM:[id number] DOB: 08-31-1996 DOA: 08/04/2023     15 DOS: the patient was seen and examined on 08/19/2023   Brief hospital course: [name] was admitted to the hospital with the working diagnosis of volume overload in the setting of ESRD.   27 year old male with history of ESRD on dialysis on TTS schedule, morbid obesity, systolic CHF, diabetes type 1, recurrent ascites requiring paracentesis, HTN, anxiety who presented with myalgia, shortness of breath, abdominal distention, lower extremity edema.  He has been not compliant with outpatient hemodialysis and outpatient paracentesis.   He was found to be severely volume overloaded on presentation and on acute respiratory failure with hypoxia and hypercarbia secondary to pulmonary edema.  His blood pressure was 147/75, HT 90, RR 23 and 02 saturation 91% on supplemental 02 per [place], lungs with bilateral rales with no wheezing, heart with S1 and S2 present and regular, abdomen non tender, positive ascites, positive lower extremity edema.   Na 142, K 5.9 Cl 97, bicarbonate 32, glucose 132 bun 66 cr 9,5  AST 43 ALT 35 total bilirubin 1.5  Wbc 5.4 hgb 8,9 plt 231  Sars covid 19 negative   Chest radiograph with cardiomegaly with diffuse extensive interstitial infiltrates bilaterally (homogenous), with bilateral pleural effusions.   12/11 left knee aspiration and steroid injection.   Nephrology consulted  for serial dialysis.   Patient is from SNF.  Patient has persistent weakness in bilateral lower extremities, imagings did not support any fracture or dislocation. PT/OT consulted , recommendation is to go back to SNF.    12/14 volume status is improving with ultrafiltration, plan to return to SNF when euvolemic state.   Assessment and Plan: ESRD on dialysis (HCC) Volume overload. Hyponatremia,   BUN today is down to 80 from 104, K is 4.4 and serum bicarbonate at 26  Na 130   Patient having HD  with good toleration, aggressive ultrafiltration for volume removal.  Fluid restriction.  He is not yet back to his dry weight.  Continue to follow up nephrology recommendations regards of inpatient renal replacement therapy.   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   Acute metabolic encephalopathy, Decreased frequency and dose of opiate analgesics.  Decreased dose of cyclobenzaprine and discontinue gabapentin, with improvement in his mentation.   Continue neuro checks per unit protocol.  Patient very debilitated.  Essential hypertension Continue blood pressure control with hydralazine/ isosorbide, carvedilol and entresto   Cirrhosis (HCC) Sp paracentesis, 12/02 with 3 L removed.  Plan to repeat paracentesis.  He gets outpatient paracentesis routinely.   Acute on chronic HFrEF (heart failure with reduced ejection fraction) (HCC) Echocardiogram 06/2023 (care everywhere) preserved LV systolic function with EF 55 to 60%, mild LVH, possible loculated inferior small size pericardial effusion, RV with normal size, and normal systolic function, LA and RA with normal size, no significant valvular disease.    Continue ultrafiltration with hemodialysis  Continue blood pressure monitoring   Continue with carvedilol and entresto Off telemetry.    Knee pain Bilateral knee pain and back pain, with ambulatory dysfunction. Pelvic fracture has been ruled out.  12/11 left knee aspiration and injection.   Positive ambulatory dysfunction.   Uncontrolled type 1 diabetes mellitus with hyperglycemia, with long-term current use of insulin (HCC) Continue insulin sliding scale for glucose cover and monitoring. Continue basal insulin.  Fasting glucose 80 mg/ dl today.  Capillary glucose has been controlled.  Subjective: Patient is feeling better, dyspnea and edema have been improving, knee pain is better controlled.   Physical  Exam: Vitals:   08/19/23 0300 08/19/23 0313 08/19/23 0753 08/19/23 1105  BP:  (!) 150/72 (!) 159/69 (!) 146/78  Pulse:  86 93 97  Resp:  18 20 18   Temp:  98 F (36.7 C) (!) 97.4 F (36.3 C) 98.1 F (36.7 C)  TempSrc:  Oral Oral Oral  SpO2:   98% 99%  Weight: (!) 157.7 kg     Height:       Neurology awake and alert ENT with no pallor or icterus Cardiovascular with S1 and S2 present and regular with no gallops, rubs or murmurs Respiratory with mild rales with no wheezing or rhonchi Abdomen with distention, ascites Positive lower extremity edema ++  Data Reviewed:    Family Communication: I spoke with patient's sister at the bedside, we talked in detail about patient's condition, plan of care and prognosis and all questions were addressed.   Disposition: Status is: Inpatient Remains inpatient appropriate because: pending further fluid removal   Planned Discharge Destination: Skilled nursing facility      Author: Coralie Keens, MD 08/19/2023 2:35 PM  For on call review [email or web address].

## 2023-08-19 NOTE — Progress Notes (Addendum)
[facility] Progress Note   Subjective: HD last night, Net UF 4.9 liters. No C/Os today.   Objective Vitals:   08/19/23 0300 08/19/23 0313 08/19/23 0753 08/19/23 1105  BP:  (!) 150/72 (!) 159/69 (!) 146/78  Pulse:  86 93 97  Resp:  18 20 18   Temp:  98 F (36.7 C) (!) 97.4 F (36.3 C) 98.1 F (36.7 C)  TempSrc:  Oral Oral Oral  SpO2:   98% 99%  Weight: (!) 157.7 kg     Height:       Physical Exam General: Chronically ill appearing male in NAD Heart: S1,S2 RRR No M/R/G Lungs: Bilateral breath sounds decreased in bases otherwise CTAB Abdomen: Taut, distended, probable ascites present. NABS.  Extremities: 2+ BLE pitting edema Dialysis Access: AVF + T/B  Additional Objective Labs: Basic Metabolic Panel: Recent Labs  Lab 08/17/23 0824 08/18/23 2207 08/19/23 0313  NA 132* 132* 130*  K 4.4 5.3* 4.4  CL 91* 90* 92*  CO2 28 25 26   GLUCOSE 180* 187* 166*  BUN 73* 104* 80*  CREATININE 5.25* 6.63* 5.12*  CALCIUM 9.7 9.6 9.4  PHOS 5.9* 7.0* 5.8*   Liver Function Tests: Recent Labs  Lab 08/17/23 0824 08/18/23 2207 08/19/23 0313  ALBUMIN 2.6* 2.5* 2.7*   No results for input(s): "LIPASE", "AMYLASE" in the last 168 hours. CBC: Recent Labs  Lab 08/13/23 0247 08/13/23 1101 08/14/23 0741 08/15/23 0245 08/18/23 2207  WBC 6.0 5.5 6.1 5.7 6.7  NEUTROABS 5.1  --  5.3 4.2  --   HGB 10.0* 9.7* 9.6* 9.9* 9.7*  HCT 33.4* 32.9* 32.3* 32.6* 32.1*  MCV 92.0 92.2 91.5 91.6 91.7  PLT 393 355 385 391 362   Blood Culture    Component Value Date/Time   SDES BLOOD BLOOD RIGHT ARM 08/05/2023 0236   SPECREQUEST  08/05/2023 0236    BOTTLES DRAWN AEROBIC AND ANAEROBIC Blood Culture adequate volume   CULT  08/05/2023 0236    NO GROWTH 5 DAYS Performed at [facility], [address] [place], [place] [address]    REPTSTATUS 08/10/2023 FINAL 08/05/2023 0236    Cardiac Enzymes: No results for input(s): "CKTOTAL", "CKMB", "CKMBINDEX", "TROPONINI" in the  last 168 hours. CBG: Recent Labs  Lab 08/18/23 1516 08/18/23 2108 08/19/23 0303 08/19/23 0550 08/19/23 1102  GLUCAP 116* 195* 165* 153* 156*   Iron Studies: No results for input(s): "IRON", "TIBC", "TRANSFERRIN", "FERRITIN" in the last 72 hours. @lablastinr3 @ Studies/Results: No results found. Medications:   bupivacaine(PF)  10 mL Infiltration Once   carvedilol  25 mg Oral BID WC   Chlorhexidine Gluconate Cloth  6 each Topical Q0600   Chlorhexidine Gluconate Cloth  6 each Topical Q0600   darbepoetin (ARANESP) injection - DIALYSIS  200 mcg Subcutaneous Q Mon-1800   feeding supplement (NEPRO CARB STEADY)  237 mL Oral BID BM   heparin  5,000 Units Subcutaneous Q8H   hydrALAZINE  100 mg Oral TID   insulin aspart  0-15 Units Subcutaneous TID WC   insulin aspart  0-5 Units Subcutaneous QHS   insulin aspart  3 Units Subcutaneous TID WC   insulin glargine-yfgn  5 Units Subcutaneous Daily   isosorbide mononitrate  90 mg Oral Daily   loratadine  10 mg Oral Daily   methylPREDNISolone acetate  40 mg Intra-articular Once   polyethylene glycol  17 g Oral Daily   sacubitril-valsartan  1 tablet Oral BID   sucroferric oxyhydroxide  1,000 mg Oral TID WC  Dialysis Orders: HP TTS 4:15 500/A2x EDW 112 kg 2K/2Ca AVF  Heparin 16109 Units IV TIW Last HD 11/28 - post HD wt 146 kg  Mircera 200 q 2 weeks (last 11/19) Venofer 100 x 10  Hectorol 8 q TIW   Assessment/Plan: #Profound volume overload - Chronic issue.  > 30 kg over EDW on presentation.  Received serial HD treatment. Remains extremely volume overloaded. Needs more aggressive UF goals, tolerated 6L on 08/15/23, continue high UF goals. Continue volume restriction (reinforced again today) and extra HD as schedule allows. Net UF with HD 4.9 12/14-post wt 115 kg. Possibly making progress. Needs paracentesis.    #ESRD -  HD TTS as OP via AVF. Serial HD as above. Next HD 08/21/2023   #Hyperkalemia - has corrected with HD but orders food  from delivery services, noncompliant with dietary restrictions.    #Hypertension/volume  - BP acceptable. Massive volume overload as above   #Anemia  -  on ESA- continue here.  Hgb 9.9. Likely a component of hemodilution too.    #CKD-MBD/hyperphosphatemia-Phos elevated. currently on Velphoro, added 2 tablet with snacks as well.  Follow labs.  Calcium borderline, not currently on VDRA   #Recurrent ascites -  s/p paracentesis 12/2 of 3 liters. Appears to have ascites by exam. Paracentesis ordered.  #Nutrition - Renal diet with fluid restriction   # Pain presumably due to edema and fluid overload.   CT scan of pelvis with no fracture.  Continue supportive care, started empiric steroids by primary team because of increased inflammatory marker.    Augie Vane H. Nevaen Tredway NP-C 08/19/2023, 12:17 PM  [employer] [phone]

## 2023-08-19 NOTE — Plan of Care (Signed)
  Problem: Activity: Goal: Risk for activity intolerance will decrease Outcome: Completed/Met   Problem: Coping: Goal: Level of anxiety will decrease Outcome: Completed/Met   Problem: Elimination: Goal: Will not experience complications related to bowel motility Outcome: Completed/Met   Problem: Safety: Goal: Ability to remain free from injury will improve Outcome: Completed/Met

## 2023-08-19 NOTE — Progress Notes (Signed)
Received patient in bed to unit.  Alert and oriented.  Informed consent signed and in chart.   TX duration:2:40 minutes  Patient wanted to be disconnected with 25 minutes left Transported back to the room  Alert, without acute distress.  Hand-off given to patient's nurse.   Access used: AVF/G Access issues: none  Total UF removed: 4900 Medication(s) given: heparin bolus 10,000 units Post HD VS: see table below Post HD weight: 151.5kg    08/19/23 0055  Vitals  Temp 98.1 F (36.7 C)  Temp Source Oral  BP 102/86  MAP (mmHg) 93  BP Location Right Arm  BP Method Automatic  Patient Position (if appropriate) Lying  Pulse Rate Source Monitor  ECG Heart Rate 85  Resp 15  Oxygen Therapy  O2 Device Nasal Cannula  O2 Flow Rate (L/min) 4 L/min  Patient Activity (if Appropriate) In bed  Pulse Oximetry Type Continuous  During Treatment Monitoring  Blood Flow Rate (mL/min) 0 mL/min  Arterial Pressure (mmHg) 33.73 mmHg  Venous Pressure (mmHg) -18.79 mmHg  TMP (mmHg) 23.84 mmHg  Ultrafiltration Rate (mL/min) 1998 mL/min  Dialysate Flow Rate (mL/min) 299 ml/min  Dialysate Potassium Concentration 2  Dialysate Calcium Concentration 2.5  Duration of HD Treatment -hour(s) 2.66 hour(s)  Cumulative Fluid Removed (mL) per Treatment  1610.96  HD Safety Checks Performed Yes  Intra-Hemodialysis Comments See progress note  Post Treatment  Dialyzer Clearance Lightly streaked  Hemodialysis Intake (mL) 0 mL  Liters Processed 63.8  Fluid Removed (mL) 4900 mL  Tolerated HD Treatment No (Comment)  Post-Hemodialysis Comments goal not met  AVG/AVF Arterial Site Held (minutes) 5 minutes  AVG/AVF Venous Site Held (minutes) 5 minutes  Fistula / Graft Left Upper arm Arteriovenous fistula  Placement Date/Time: 03/04/21 1327   Placed prior to admission: No  Orientation: Left  Access Location: Upper arm  Access Type: (c) Arteriovenous fistula  Site Condition No complications  Fistula / Graft  Assessment Present;Thrill;Bruit;Aneurysm present  Status Deaccessed;Flushed;Patent      [name]

## 2023-08-20 ENCOUNTER — Inpatient Hospital Stay (HOSPITAL_COMMUNITY): Payer: Medicare Other

## 2023-08-20 DIAGNOSIS — N186 End stage renal disease: Secondary | ICD-10-CM | POA: Diagnosis not present

## 2023-08-20 DIAGNOSIS — K7460 Unspecified cirrhosis of liver: Secondary | ICD-10-CM | POA: Diagnosis not present

## 2023-08-20 DIAGNOSIS — I10 Essential (primary) hypertension: Secondary | ICD-10-CM | POA: Diagnosis not present

## 2023-08-20 DIAGNOSIS — I5023 Acute on chronic systolic (congestive) heart failure: Secondary | ICD-10-CM | POA: Diagnosis not present

## 2023-08-20 HISTORY — PX: IR PARACENTESIS: IMG2679

## 2023-08-20 LAB — RENAL FUNCTION PANEL
Albumin: 2.5 g/dL — ABNORMAL LOW (ref 3.5–5.0)
Anion gap: 12 (ref 5–15)
BUN: 98 mg/dL — ABNORMAL HIGH (ref 6–20)
CO2: 30 mmol/L (ref 22–32)
Calcium: 9.4 mg/dL (ref 8.9–10.3)
Chloride: 94 mmol/L — ABNORMAL LOW (ref 98–111)
Creatinine, Ser: 6.28 mg/dL — ABNORMAL HIGH (ref 0.61–1.24)
GFR, Estimated: 12 mL/min — ABNORMAL LOW (ref >60)
Glucose, Bld: 151 mg/dL — ABNORMAL HIGH (ref 70–99)
Phosphorus: 6.6 mg/dL — ABNORMAL HIGH (ref 2.5–4.6)
Potassium: 4.9 mmol/L (ref 3.5–5.1)
Sodium: 136 mmol/L (ref 135–145)

## 2023-08-20 LAB — GLUCOSE, CAPILLARY
Glucose-Capillary: 135 mg/dL — ABNORMAL HIGH (ref 70–99)
Glucose-Capillary: 147 mg/dL — ABNORMAL HIGH (ref 70–99)
Glucose-Capillary: 122 mg/dL — ABNORMAL HIGH (ref 70–99)
Glucose-Capillary: 119 mg/dL — ABNORMAL HIGH (ref 70–99)
Glucose-Capillary: 149 mg/dL — ABNORMAL HIGH (ref 70–99)

## 2023-08-20 MED ORDER — LIDOCAINE HCL 1 % IJ SOLN
INTRAMUSCULAR | Status: AC
Start: 2023-08-20 — End: ?
  Filled 2023-08-20: qty 20

## 2023-08-20 MED ORDER — HYDROMORPHONE HCL 1 MG/ML IJ SOLN
0.5000 mg | Freq: Once | INTRAMUSCULAR | Status: AC
  Administered 2023-08-20: 0.5 mg via INTRAVENOUS
  Filled 2023-08-20: qty 0.5

## 2023-08-20 MED ORDER — LIDOCAINE HCL 1 % IJ SOLN
20.0000 mL | Freq: Once | INTRAMUSCULAR | Status: AC
Start: 2023-08-20 — End: 2023-08-20
  Administered 2023-08-20: 10 mL
  Filled 2023-08-20: qty 20

## 2023-08-20 NOTE — Procedures (Signed)
PROCEDURE SUMMARY:  He was found to be severely volume overloaded on presentation and on acute respiratory failure with hypoxia and hypercarbia secondary to pulmonary edema.   Successful US guided paracentesis from left lateral abdomen.  Yielded 3.9 liters of yellow fluid.  No immediate complications.  Pt tolerated well.   Specimen was not sent for labs.  EBL < 5mL  Hoyt Koch PA-C 08/20/2023 9:52 AM

## 2023-08-20 NOTE — Care Management Important Message (Signed)
Important Message  Patient Details  Name: Bobby [name] MRN: [id number] Date of Birth: 02-05-1996   Important Message Given:  Yes - Medicare IM     Renie Ora 08/20/2023, 10:19 AM

## 2023-08-20 NOTE — Progress Notes (Addendum)
[facility] Progress Note   Subjective:   Seen in room. S/p paracentesis this AM - he is upset that only 4L removed, he thinks he had more ascites - hard to tell. Denies CP/dyspnea. Per weights, no improvement despite being here > 2 weeks. Need to get a standing weight on him - will order. For HD tomorrow.  Objective Vitals:   08/19/23 1935 08/20/23 0022 08/20/23 0653 08/20/23 0900  BP: 125/68 (!) 155/67 138/71 (!) 141/69  Pulse: 88 87 89   Resp: 20 20 20    Temp: 97.7 F (36.5 C) (!) 97.4 F (36.3 C) 97.7 F (36.5 C)   TempSrc: Oral Oral Oral   SpO2: 98% 92% 98%   Weight:   (!) 158.7 kg   Height:       Physical Exam General: Chronically ill appearing man, NAD. Heart: RRR; no murmur Lungs: CTA anteriorly Abdomen: distended, non-tender. Dry bandage in L abdomen s/p recent para Extremities: 3+ tense BLE edema Dialysis Access: AVF + t/b  Additional Objective Labs: Basic Metabolic Panel: Recent Labs  Lab 08/18/23 2207 08/19/23 0313 08/20/23 0226  NA 132* 130* 136  K 5.3* 4.4 4.9  CL 90* 92* 94*  CO2 25 26 30   GLUCOSE 187* 166* 151*  BUN 104* 80* 98*  CREATININE 6.63* 5.12* 6.28*  CALCIUM 9.6 9.4 9.4  PHOS 7.0* 5.8* 6.6*   Liver Function Tests: Recent Labs  Lab 08/18/23 2207 08/19/23 0313 08/20/23 0226  ALBUMIN 2.5* 2.7* 2.5*   CBC: Recent Labs  Lab 08/14/23 0741 08/15/23 0245 08/18/23 2207  WBC 6.1 5.7 6.7  NEUTROABS 5.3 4.2  --   HGB 9.6* 9.9* 9.7*  HCT 32.3* 32.6* 32.1*  MCV 91.5 91.6 91.7  PLT 385 391 362   Studies/Results: IR Paracentesis Result Date: 08/20/2023 INDICATION: 27 year old male with end stage renal disease on HD, now with fluid overload and ascites. Request made for paracentesis. EXAM: ULTRASOUND GUIDED THERAPEUTIC PARACENTESIS MEDICATIONS: 10 mL 1% lidocaine COMPLICATIONS: None immediate. PROCEDURE: Informed written consent was obtained from the patient after a discussion of the risks, benefits and alternatives to  treatment. A timeout was performed prior to the initiation of the procedure. Initial ultrasound scanning demonstrates a moderate amount of ascites within the left lateral abdominal quadrant. The right lower abdomen was prepped and draped in the usual sterile fashion. 1% lidocaine was used for local anesthesia. Following this, a 19 gauge, 7-cm, Yueh catheter was introduced. An ultrasound image was saved for documentation purposes. The paracentesis was performed. The catheter was removed and a dressing was applied. The patient tolerated the procedure well without immediate post procedural complication. Patient received post-procedure intravenous albumin; see nursing notes for details. FINDINGS: A total of approximately 3.9 liters of yellow fluid was removed. Samples were sent to the laboratory as requested by the clinical team. IMPRESSION: Successful ultrasound-guided paracentesis yielding 3.9 liters of peritoneal fluid. Performed by: Loyce Dys PA-C Electronically Signed   By: Malachy Moan M.D.   On: 08/20/2023 10:15   Medications:   carvedilol  25 mg Oral BID WC   Chlorhexidine Gluconate Cloth  6 each Topical Q0600   darbepoetin (ARANESP) injection - DIALYSIS  200 mcg Subcutaneous Q Mon-1800   feeding supplement (NEPRO CARB STEADY)  237 mL Oral BID BM   heparin  5,000 Units Subcutaneous Q8H   hydrALAZINE  100 mg Oral TID   insulin aspart  0-15 Units Subcutaneous TID WC   insulin aspart  0-5 Units Subcutaneous QHS  insulin aspart  3 Units Subcutaneous TID WC   insulin glargine-yfgn  5 Units Subcutaneous Daily   isosorbide mononitrate  90 mg Oral Daily   loratadine  10 mg Oral Daily   polyethylene glycol  17 g Oral Daily   sacubitril-valsartan  1 tablet Oral BID   sucroferric oxyhydroxide  1,000 mg Oral TID WC    Dialysis Orders: TTS - HP 4:15hr, 500/A2, EDW 112kg, 2K/2Ca, AVF, heparin 10K three times per week - Mircera q 2 weeks - Hectoral q HD - Venofer course was  given prior to admit  Assessment/Plan: 1. Chronic/severe volume overload: Serial HD this admit, per scale no improvement. Frustrating and unclear why, ?sneaking fluids. Need to get a standing weight on him if possible - ordered. Maximizing UF with each HD.  2. ESRD: Serial HD on admit, now just TTS - unable to accommodate daily HD with high dialysis census. For HD tomorrow, 6L UFG. 3. HTN/volume:  BP ok, chronic edema. See above. 4. Anemia: Hgb 9.7 - continue Aranesp q Monday while here. 5. Secondary hyperparathyroidism:  CorrCa high, Phos high. VDRA on hold, continue Velphoro as binders (refusing on most days). 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 He is certainly high risk for this. 6. Nutrition:  Alb low, renal diet with fluid restrictions 7. T1DM: On insulin. 8. 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 9. LE weakness/pain: No fractures, result of #1 10. Dispo: Unfortunately no making major improvement here from volume standpoint, will verify with standing weight. Plan for SNF on d/c  Lily Peer 08/20/2023, 11:49 AM  [employer]

## 2023-08-20 NOTE — Progress Notes (Signed)
  Patient refusing subcu heparin shot for DVT prophylaxis.  Unclear reason.

## 2023-08-20 NOTE — Progress Notes (Signed)
PT Cancellation Note  Patient Details Name: Bobby [name] MRN: [id number] DOB: 16-May-1996   Cancelled Treatment:    Reason Eval/Treat Not Completed: Patient declined, no reason specified (pt recently returned to supine for rest and taking nap. pt declines today due to pain and fatigue, "I want to rest". Despite education on importance of frequent mobility pt declines. Will follow up at later date/time as schedule allows and pt able.)   Renaldo Fiddler PT, DPT Acute Rehabilitation Services Office [phone]  08/20/23 4:05 PM

## 2023-08-20 NOTE — Plan of Care (Signed)
  Problem: Nutrition: Goal: Adequate nutrition will be maintained Outcome: Progressing   Problem: Pain Management: Goal: General experience of comfort will improve Outcome: Progressing   Problem: Skin Integrity: Goal: Risk for impaired skin integrity will decrease Outcome: Progressing

## 2023-08-20 NOTE — Progress Notes (Addendum)
Progress Note   Patient: Ngai Konigsberg WUX:[id number] DOB: 11-Jan-1996 DOA: 08/04/2023     16 DOS: the patient was seen and examined on 08/20/2023   Brief hospital course: Mr. Brinser was admitted to the hospital with the working diagnosis of volume overload in the setting of ESRD.   27 year old male with history of ESRD on dialysis on TTS schedule, morbid obesity, systolic CHF, diabetes type 1, recurrent ascites requiring paracentesis, HTN, anxiety who presented with myalgia, shortness of breath, abdominal distention, lower extremity edema.  He has been not compliant with outpatient hemodialysis and outpatient paracentesis.   He was found to be severely volume overloaded on presentation and on acute respiratory failure with hypoxia and hypercarbia secondary to pulmonary edema.  His blood pressure was 147/75, HT 90, RR 23 and 02 saturation 91% on supplemental 02 per [place], lungs with bilateral rales with no wheezing, heart with S1 and S2 present and regular, abdomen non tender, positive ascites, positive lower extremity edema.   Na 142, K 5.9 Cl 97, bicarbonate 32, glucose 132 bun 66 cr 9,5  AST 43 ALT 35 total bilirubin 1.5  Wbc 5.4 hgb 8,9 plt 231  Sars covid 19 negative   Chest radiograph with cardiomegaly with diffuse extensive interstitial infiltrates bilaterally (homogenous), with bilateral pleural effusions.   12/11 left knee aspiration and steroid injection.   Nephrology consulted  for serial dialysis.   Patient is from SNF.  Patient has persistent weakness in bilateral lower extremities, imagings did not support any fracture or dislocation. PT/OT consulted , recommendation is to go back to SNF.    12/14 volume status is improving with ultrafiltration, plan to return to SNF when euvolemic state.  12/16 paracentesis 3,9 L removed with good toleration.   Assessment and Plan: ESRD on dialysis (HCC) Volume overload. Hyponatremia,   BUN 98, K 4,9 and serum bicarbonate at 30  Na  136 P 6.6   Patient having HD with good toleration, aggressive ultrafiltration for volume removal.  Fluid restriction.  He is not yet back to his dry weight.  Continue to follow up nephrology recommendations regards of inpatient renal replacement therapy.   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   Acute metabolic encephalopath has resolved.  Decreased frequency and dose of opiate analgesics.  Decreased dose of cyclobenzaprine and discontinue gabapentin, with improvement in his mentation.   Continue neuro checks per unit protocol.  Patient very debilitated.  Essential hypertension Continue blood pressure control with hydralazine/ isosorbide, carvedilol and entresto   Cirrhosis (HCC) 12/02 paracentesis 3 L removed.  12/16 paracentesis 3.9 L removed.   He gets outpatient paracentesis routinely every 3 to  4 weeks. If persistent ascites post HD will repeat paracentesis,   Acute on chronic HFrEF (heart failure with reduced ejection fraction) (HCC) Echocardiogram 06/2023 (care everywhere) preserved LV systolic function with EF 55 to 60%, mild LVH, possible loculated inferior small size pericardial effusion, RV with normal size, and normal systolic function, LA and RA with normal size, no significant valvular disease.    Continue ultrafiltration with hemodialysis  Continue blood pressure monitoring   Continue with carvedilol and entresto After load reduction with hydralazine and isosorbide.  Off telemetry.    Knee pain Bilateral knee pain and back pain, with ambulatory dysfunction. Pelvic fracture has been ruled out.  12/11 left knee aspiration and injection.   Positive ambulatory dysfunction.   Uncontrolled type 1 diabetes mellitus with hyperglycemia, with long-term current use of  insulin (HCC) Continue insulin sliding scale for glucose cover and monitoring. Continue basal insulin.  Fasting glucose 151 mg/ dl today.   Capillary glucose has been controlled.         Subjective: Patient with improvement in dyspnea and edema, he had paracentesis but feels like he still has abdominal fluid   Physical Exam: Vitals:   08/20/23 0022 08/20/23 0653 08/20/23 0900 08/20/23 1500  BP: (!) 155/67 138/71 (!) 141/69 119/83  Pulse: 87 89    Resp: 20 20    Temp: (!) 97.4 F (36.3 C) 97.7 F (36.5 C)    TempSrc: Oral Oral    SpO2: 92% 98%    Weight:  (!) 158.7 kg    Height:       Neurology awake and alert ENT with mild pallor Cardiovascular with S1 and S2 present and regular with no gallops, rubs or murmurs Respiratory with mild rales at bases with no wheezing or rhonchi Abdomen with no distention  Positive lower extremity edema  Data Reviewed:    Family Communication: no family at the bedside   Disposition: Status is: Inpatient Remains inpatient appropriate because: pending to achieve euvolemic state   Planned Discharge Destination: Skilled nursing facility     Author: Coralie Keens, MD 08/20/2023 4:18 PM  For on call review [email or web address].

## 2023-08-21 ENCOUNTER — Inpatient Hospital Stay (HOSPITAL_COMMUNITY): Payer: Medicare Other

## 2023-08-21 DIAGNOSIS — N186 End stage renal disease: Secondary | ICD-10-CM | POA: Diagnosis not present

## 2023-08-21 DIAGNOSIS — I5023 Acute on chronic systolic (congestive) heart failure: Secondary | ICD-10-CM | POA: Diagnosis not present

## 2023-08-21 DIAGNOSIS — I10 Essential (primary) hypertension: Secondary | ICD-10-CM | POA: Diagnosis not present

## 2023-08-21 DIAGNOSIS — K7460 Unspecified cirrhosis of liver: Secondary | ICD-10-CM | POA: Diagnosis not present

## 2023-08-21 LAB — RENAL FUNCTION PANEL
Albumin: 2.5 g/dL — ABNORMAL LOW (ref 3.5–5.0)
Anion gap: 14 (ref 5–15)
BUN: 113 mg/dL — ABNORMAL HIGH (ref 6–20)
CO2: 31 mmol/L (ref 22–32)
Calcium: 9.7 mg/dL (ref 8.9–10.3)
Chloride: 92 mmol/L — ABNORMAL LOW (ref 98–111)
Creatinine, Ser: 7.23 mg/dL — ABNORMAL HIGH (ref 0.61–1.24)
GFR, Estimated: 10 mL/min — ABNORMAL LOW (ref >60)
Glucose, Bld: 149 mg/dL — ABNORMAL HIGH (ref 70–99)
Phosphorus: 7.8 mg/dL — ABNORMAL HIGH (ref 2.5–4.6)
Potassium: 5.4 mmol/L — ABNORMAL HIGH (ref 3.5–5.1)
Sodium: 137 mmol/L (ref 135–145)

## 2023-08-21 LAB — GLUCOSE, CAPILLARY
Glucose-Capillary: 88 mg/dL (ref 70–99)
Glucose-Capillary: 170 mg/dL — ABNORMAL HIGH (ref 70–99)
Glucose-Capillary: 93 mg/dL (ref 70–99)
Glucose-Capillary: 135 mg/dL — ABNORMAL HIGH (ref 70–99)

## 2023-08-21 LAB — PARATHYROID HORMONE, INTACT (NO CA): PTH: 122 pg/mL — ABNORMAL HIGH (ref 15–65)

## 2023-08-21 MED ORDER — LIDOCAINE HCL (PF) 1 % IJ SOLN: 5.0000 mL | INTRAMUSCULAR | Status: DC | PRN

## 2023-08-21 MED ORDER — HEPARIN SODIUM (PORCINE) 1000 UNIT/ML DIALYSIS: 1000.0000 [IU] | INTRAMUSCULAR | Status: DC | PRN

## 2023-08-21 MED ORDER — HEPARIN SODIUM (PORCINE) 1000 UNIT/ML DIALYSIS: 10000.0000 [IU] | Freq: Once | INTRAMUSCULAR | Status: AC

## 2023-08-21 MED ORDER — LIDOCAINE-PRILOCAINE 2.5-2.5 % EX CREA: 1.0000 | TOPICAL_CREAM | CUTANEOUS | Status: DC | PRN

## 2023-08-21 MED ORDER — HEPARIN SODIUM (PORCINE) 1000 UNIT/ML DIALYSIS
10000.0000 [IU] | Freq: Once | INTRAMUSCULAR | Status: AC
  Administered 2023-08-21: 10000 [IU] via INTRAVENOUS_CENTRAL
  Filled 2023-08-21 (×2): qty 10

## 2023-08-21 MED ORDER — PENTAFLUOROPROP-TETRAFLUOROETH EX AERO: 1.0000 | INHALATION_SPRAY | CUTANEOUS | Status: DC | PRN

## 2023-08-21 MED ORDER — LIDOCAINE-PRILOCAINE 2.5-2.5 % EX CREA
1.0000 | TOPICAL_CREAM | CUTANEOUS | Status: DC | PRN
Start: 2023-08-21 — End: 2023-08-21

## 2023-08-21 MED ORDER — HEPARIN SODIUM (PORCINE) 1000 UNIT/ML DIALYSIS: 5000.0000 [IU] | Freq: Once | INTRAMUSCULAR | Status: AC

## 2023-08-21 NOTE — Progress Notes (Incomplete)
PROGRESS NOTE    Bobby [name]  KGM:[id number] DOB: Jun 28, 1996 DOA: 08/04/2023 PCP: [facility]  27/M w ESRD on dialysis on TTS schedule, morbid obesity, systolic CHF, diabetes type 1, recurrent ascites requiring paracentesis, HTN, anxiety who presented with myalgia, shortness of breath, abdominal distention, lower extremity edema.  Noncompliant with outpatient hemodialysis and outpatient paracentesis.   -Found to be severely volume overloaded on presentation and on acute respiratory failure with hypoxia and hypercarbia secondary to pulmonary edema. bun 66 cr 9,5  CXR w diffuse extensive interstitial infiltrates bilaterally (homogenous), with bilateral pleural effusions.  12/11 left knee aspiration and steroid injection.  Nephrology consulted  for serial dialysis.   Patient is from SNF.  Patient has persistent weakness in bilateral lower extremities, imagings did not support any fracture or dislocation. PT/OT consulted , recommendation is to go back to SNF.  12/14 volume status is improving with ultrafiltration, plan to return to SNF when euvolemic state.  12/16 paracentesis 3,9 L removed with good toleration.  12/17 patient had HD with good toleration  Subjective:  Assessment and Plan:  ESRD on dialysis (HCC) Volume overload. Hyponatremia, hyperkalemia  -Undergoing serial HD>aggressive ultrafiltration for volume removal.  -still vol overloaded -per Nephrology  Anemia of chronic renal disease with iron deficiency anemia Continue with IV iron and EPO.   Acute metabolic encephalopathy has resolved.  Decreased frequency and dose of opiate analgesics.  Decreased dose of cyclobenzaprine and discontinued gabapentin, with improvement in his mentation.   Essential hypertension Continue blood pressure control with hydralazine/ isosorbide, carvedilol and entresto   Cirrhosis (HCC) 12/02 paracentesis 3 L removed.  12/16 paracentesis 3.9 L removed.  -gets  outpatient paracentesis routinely every 3 to  4 weeks. Patient complains of persistent ascites, will check abdominal US, if recurrent fluid accumulation may need to repeat paracentesis.   Acute on chronic HFrEF (heart failure with reduced ejection fraction) (HCC) Echocardiogram 06/2023 (care everywhere) preserved LV systolic function with EF 55 to 60%, mild LVH, possible loculated inferior small size pericardial effusion, RV with normal size, and normal systolic function, LA and RA with normal size, no significant valvular disease.   -Continue ultrafiltration with hemodialysis  -carvedilol, entresto, hydralazine and isosorbide.  Off telemetry.    Knee pain Bilateral knee pain and back pain, with ambulatory dysfunction. Pelvic fracture has been ruled out.  12/11 left knee aspiration and injection.   Positive ambulatory dysfunction.   Uncontrolled type 1 diabetes mellitus with hyperglycemia, with long-term current use of insulin (HCC) Continue insulin sliding scale for glucose cover and monitoring. Continue basal insulin.  Fasting glucose 113 mg/ dl today.  Capillary glucose has been controlled.   DVT prophylaxis: Hep SQ Code Status: Full Code Family Communication: Disposition Plan:   Consultants:    Procedures:   Antimicrobials:    Objective: Vitals:   08/21/23 1236 08/21/23 1245 08/21/23 1246 08/21/23 1400  BP: 127/83 129/68  124/78  Pulse: 87     Resp: 17 12  12   Temp: (!) 97.5 F (36.4 C)   (!) 97.5 F (36.4 C)  TempSrc: Oral   Oral  SpO2: 96% 97%  98%  Weight:   (!) 149.9 kg   Height:        Intake/Output Summary (Last 24 hours) at 08/21/2023 2022 Last data filed at 08/21/2023 1912 Gross per 24 hour  Intake 1678 ml  Output 6000 ml  Net -4322 ml   Filed Weights   08/20/23 0653 08/21/23 0814 08/21/23 1246  Weight: Marland Kitchen)  158.7 kg (!) 155.9 kg (!) 149.9 kg    Examination:      Data Reviewed:   CBC: Recent Labs  Lab 08/15/23 0245 08/18/23 2207   WBC 5.7 6.7  NEUTROABS 4.2  --   HGB 9.9* 9.7*  HCT 32.6* 32.1*  MCV 91.6 91.7  PLT 391 362   Basic Metabolic Panel: Recent Labs  Lab 08/17/23 0824 08/18/23 2207 08/19/23 0313 08/20/23 0226 08/21/23 0238  NA 132* 132* 130* 136 137  K 4.4 5.3* 4.4 4.9 5.4*  CL 91* 90* 92* 94* 92*  CO2 28 25 26 30 31   GLUCOSE 180* 187* 166* 151* 149*  BUN 73* 104* 80* 98* 113*  CREATININE 5.25* 6.63* 5.12* 6.28* 7.23*  CALCIUM 9.7 9.6 9.4 9.4 9.7  PHOS 5.9* 7.0* 5.8* 6.6* 7.8*   GFR: Estimated Creatinine Clearance: 24.4 mL/min (A) (by C-G formula based on SCr of 7.23 mg/dL (H)). Liver Function Tests: Recent Labs  Lab 08/17/23 0824 08/18/23 2207 08/19/23 0313 08/20/23 0226 08/21/23 0238  ALBUMIN 2.6* 2.5* 2.7* 2.5* 2.5*   No results for input(s): "LIPASE", "AMYLASE" in the last 168 hours. No results for input(s): "AMMONIA" in the last 168 hours. Coagulation Profile: No results for input(s): "INR", "PROTIME" in the last 168 hours. Cardiac Enzymes: No results for input(s): "CKTOTAL", "CKMB", "CKMBINDEX", "TROPONINI" in the last 168 hours. BNP (last 3 results) No results for input(s): "PROBNP" in the last 8760 hours. HbA1C: No results for input(s): "HGBA1C" in the last 72 hours. CBG: Recent Labs  Lab 08/20/23 1627 08/20/23 2130 08/21/23 0614 08/21/23 1345 08/21/23 1622  GLUCAP 119* 149* 88 170* 93   Lipid Profile: No results for input(s): "CHOL", "HDL", "LDLCALC", "TRIG", "CHOLHDL", "LDLDIRECT" in the last 72 hours. Thyroid Function Tests: No results for input(s): "TSH", "T4TOTAL", "FREET4", "T3FREE", "THYROIDAB" in the last 72 hours. Anemia Panel: No results for input(s): "VITAMINB12", "FOLATE", "FERRITIN", "TIBC", "IRON", "RETICCTPCT" in the last 72 hours. Urine analysis:    Component Value Date/Time   COLORURINE YELLOW 03/01/2021 0847   APPEARANCEUR CLEAR 03/01/2021 0847   LABSPEC 1.010 03/01/2021 0847   PHURINE 7.0 03/01/2021 0847   GLUCOSEU 150 (A) 03/01/2021  0847   HGBUR SMALL (A) 03/01/2021 0847   BILIRUBINUR NEGATIVE 03/01/2021 0847   KETONESUR 5 (A) 03/01/2021 0847   PROTEINUR >=300 (A) 03/01/2021 0847   NITRITE NEGATIVE 03/01/2021 0847   LEUKOCYTESUR NEGATIVE 03/01/2021 0847   Sepsis Labs: @LABRCNTIP (procalcitonin:4,lacticidven:4)  )No results found for this or any previous visit (from the past 240 hours).   Radiology Studies: IR Paracentesis Result Date: 08/20/2023 INDICATION: 27 year old male with end stage renal disease on HD, now with fluid overload and ascites. Request made for paracentesis. EXAM: ULTRASOUND GUIDED THERAPEUTIC PARACENTESIS MEDICATIONS: 10 mL 1% lidocaine COMPLICATIONS: None immediate. PROCEDURE: Informed written consent was obtained from the patient after a discussion of the risks, benefits and alternatives to treatment. A timeout was performed prior to the initiation of the procedure. Initial ultrasound scanning demonstrates a moderate amount of ascites within the left lateral abdominal quadrant. The right lower abdomen was prepped and draped in the usual sterile fashion. 1% lidocaine was used for local anesthesia. Following this, a 19 gauge, 7-cm, Yueh catheter was introduced. An ultrasound image was saved for documentation purposes. The paracentesis was performed. The catheter was removed and a dressing was applied. The patient tolerated the procedure well without immediate post procedural complication. Patient received post-procedure intravenous albumin; see nursing notes for details. FINDINGS: A total of approximately 3.9  liters of yellow fluid was removed. Samples were sent to the laboratory as requested by the clinical team. IMPRESSION: Successful ultrasound-guided paracentesis yielding 3.9 liters of peritoneal fluid. Performed by: Loyce Dys PA-C Electronically Signed   By: Malachy Moan M.D.   On: 08/20/2023 10:15     Scheduled Meds:  carvedilol  25 mg Oral BID WC   Chlorhexidine Gluconate Cloth  6 each  Topical Q0600   darbepoetin (ARANESP) injection - DIALYSIS  200 mcg Subcutaneous Q Mon-1800   feeding supplement (NEPRO CARB STEADY)  237 mL Oral BID BM   heparin  5,000 Units Subcutaneous Q8H   hydrALAZINE  100 mg Oral TID   insulin aspart  0-15 Units Subcutaneous TID WC   insulin aspart  0-5 Units Subcutaneous QHS   insulin aspart  3 Units Subcutaneous TID WC   insulin glargine-yfgn  5 Units Subcutaneous Daily   isosorbide mononitrate  90 mg Oral Daily   loratadine  10 mg Oral Daily   polyethylene glycol  17 g Oral Daily   sacubitril-valsartan  1 tablet Oral BID   sucroferric oxyhydroxide  1,000 mg Oral TID WC   Continuous Infusions:   LOS: 17 days    Time spent:    Zannie Cove, MD [facility]   08/21/2023, 8:22 PM

## 2023-08-21 NOTE — Progress Notes (Signed)
Received patient in bed to unit.  Alert and oriented.  Informed consent signed and in chart.   TX duration:4 hours  Patient tolerated well.  Transported back to the room  Alert, without acute distress.  Hand-off given to patient's nurse.   Access used: Left upper arm fistula Access issues: none  Total UF removed: 6L Medication(s) given: Dialudid, Oxy-Acetaminofen, Atarax   08/21/23 1236  Vitals  Temp (!) 97.5 F (36.4 C)  Temp Source Oral  BP 127/83  MAP (mmHg) 97  BP Location Right Arm  BP Method Automatic  Patient Position (if appropriate) Lying  Pulse Rate 87  Pulse Rate Source Monitor  ECG Heart Rate 88  Resp 17  Oxygen Therapy  SpO2 96 %  O2 Device Nasal Cannula  O2 Flow Rate (L/min) 4 L/min  During Treatment Monitoring  Duration of HD Treatment -hour(s) 4 hour(s)  HD Safety Checks Performed Yes  Intra-Hemodialysis Comments Tx completed;Tolerated well  Dialysis Fluid Bolus Normal Saline  Bolus Amount (mL) 300 mL  Post Treatment  Dialyzer Clearance Clear  Liters Processed 96  Fluid Removed (mL) 6000 mL  Tolerated HD Treatment Yes  Fistula / Graft Left Upper arm Arteriovenous fistula  Placement Date/Time: 03/04/21 1327   Placed prior to admission: No  Orientation: Left  Access Location: Upper arm  Access Type: (c) Arteriovenous fistula  Status Deaccessed     Stacie Glaze LPN [facility]

## 2023-08-21 NOTE — Progress Notes (Signed)
[facility] Progress Note   Subjective:  Seen on HD - 6L UFG planned, may need to reduce. Was unable to stand for weight yesterday - "I just don't feel good." C/o pain all over. No fever/chills. No CP/dyspnea.  Objective Vitals:   08/20/23 2008 08/21/23 0622 08/21/23 0810 08/21/23 0814  BP: 124/68 (!) 155/86 126/80   Pulse:  85 87   Resp:  18 13   Temp: 97.8 F (36.6 C) 97.7 F (36.5 C) (!) 97.5 F (36.4 C)   TempSrc: Oral Oral Oral   SpO2: 99% 98% 98%   Weight:    (!) 155.9 kg  Height:       Physical Exam General: Chronically ill appearing man, NAD. + facial edema Heart: RRR; no murmur Lungs: CTA anteriorly Abdomen: distended, non-tender.  Extremities: 2+ tense BLE edema; distal legs with nearly black hyperpigmentation. Scattered nodules - c/w prurigo nodularis, no blisters or ulcerations Dialysis Access: AVF + t/b  Additional Objective Labs: Basic Metabolic Panel: Recent Labs  Lab 08/19/23 0313 08/20/23 0226 08/21/23 0238  NA 130* 136 137  K 4.4 4.9 5.4*  CL 92* 94* 92*  CO2 26 30 31   GLUCOSE 166* 151* 149*  BUN 80* 98* 113*  CREATININE 5.12* 6.28* 7.23*  CALCIUM 9.4 9.4 9.7  PHOS 5.8* 6.6* 7.8*   Liver Function Tests: Recent Labs  Lab 08/19/23 0313 08/20/23 0226 08/21/23 0238  ALBUMIN 2.7* 2.5* 2.5*   CBC: Recent Labs  Lab 08/15/23 0245 08/18/23 2207  WBC 5.7 6.7  NEUTROABS 4.2  --   HGB 9.9* 9.7*  HCT 32.6* 32.1*  MCV 91.6 91.7  PLT 391 362   Studies/Results: IR Paracentesis Result Date: 08/20/2023 INDICATION: 27 year old male with end stage renal disease on HD, now with fluid overload and ascites. Request made for paracentesis. EXAM: ULTRASOUND GUIDED THERAPEUTIC PARACENTESIS MEDICATIONS: 10 mL 1% lidocaine COMPLICATIONS: None immediate. PROCEDURE: Informed written consent was obtained from the patient after a discussion of the risks, benefits and alternatives to treatment. A timeout was performed prior to the initiation of  the procedure. Initial ultrasound scanning demonstrates a moderate amount of ascites within the left lateral abdominal quadrant. The right lower abdomen was prepped and draped in the usual sterile fashion. 1% lidocaine was used for local anesthesia. Following this, a 19 gauge, 7-cm, Yueh catheter was introduced. An ultrasound image was saved for documentation purposes. The paracentesis was performed. The catheter was removed and a dressing was applied. The patient tolerated the procedure well without immediate post procedural complication. Patient received post-procedure intravenous albumin; see nursing notes for details. FINDINGS: A total of approximately 3.9 liters of yellow fluid was removed. Samples were sent to the laboratory as requested by the clinical team. IMPRESSION: Successful ultrasound-guided paracentesis yielding 3.9 liters of peritoneal fluid. Performed by: Loyce Dys PA-C Electronically Signed   By: Malachy Moan M.D.   On: 08/20/2023 10:15   Medications:   carvedilol  25 mg Oral BID WC   Chlorhexidine Gluconate Cloth  6 each Topical Q0600   darbepoetin (ARANESP) injection - DIALYSIS  200 mcg Subcutaneous Q Mon-1800   feeding supplement (NEPRO CARB STEADY)  237 mL Oral BID BM   heparin  5,000 Units Subcutaneous Q8H   hydrALAZINE  100 mg Oral TID   insulin aspart  0-15 Units Subcutaneous TID WC   insulin aspart  0-5 Units Subcutaneous QHS   insulin aspart  3 Units Subcutaneous TID WC   insulin glargine-yfgn  5 Units Subcutaneous  Daily   isosorbide mononitrate  90 mg Oral Daily   loratadine  10 mg Oral Daily   polyethylene glycol  17 g Oral Daily   sacubitril-valsartan  1 tablet Oral BID   sucroferric oxyhydroxide  1,000 mg Oral TID WC    Dialysis Orders: TS - HP 4:15hr, 500/A2, EDW 112kg, 2K/2Ca, AVF, heparin 10K three times per week - Mircera q 2 weeks - Hectoral q HD - Venofer course was given prior to admit   Assessment/Plan: 1. Chronic/severe volume  overload: Serial HD this admit, per scale no improvement. Frustrating and unclear why, ?sneaking fluids. Need to get a standing weight on him if possible - ordered - unable to complete - he was "too weak." Maximizing UF with each HD.  2. ESRD: Serial HD on admit, now just TTS - unable to accommodate daily HD with high dialysis census. HD now, 6L UFG if tolerates. 3. HTN/volume:  BP ok, chronic edema. See above. 4. Anemia: Hgb 9.7 - continue Aranesp q Monday while here. 5. Secondary hyperparathyroidism:  CorrCa high, Phos high. VDRA on hold, continue Velphoro as binders (refusing on most days). 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 He is certainly high risk for this. 6. Nutrition:  Alb low, renal diet with fluid restrictions 7. T1DM: On insulin. 8. 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 9. LE weakness/pain: No fractures, result of #1 10. Dispo: Unfortunately no making major improvement here from volume standpoint, need to verify with standing weight. Plan for SNF on d/c  Ozzie Hoyle, PA-C 08/21/2023, 8:47 AM  [employer]

## 2023-08-21 NOTE — Progress Notes (Signed)
OT Cancellation Note  Patient Details Name: Zuko Bisset MRN: [id number] DOB: 27-Apr-1996   Cancelled Treatment:    Reason Eval/Treat Not Completed: Patient at procedure or test/ unavailable off unit for HD. Will follow up for OT session as schedule permits.  Lorre Munroe 08/21/2023, 8:20 AM

## 2023-08-21 NOTE — Plan of Care (Signed)
  Problem: Nutritional: Goal: Progress toward achieving an optimal weight will improve Outcome: Progressing

## 2023-08-21 NOTE — Progress Notes (Signed)
Progress Note   Patient: Bobby [name] XBM:[id number] DOB: 12-23-1995 DOA: 08/04/2023     17 DOS: the patient was seen and examined on 08/21/2023   Brief hospital course: Bobby [name] was admitted to the hospital with the working diagnosis of volume overload in the setting of ESRD.   27 year old male with history of ESRD on dialysis on TTS schedule, morbid obesity, systolic CHF, diabetes type 1, recurrent ascites requiring paracentesis, HTN, anxiety who presented with myalgia, shortness of breath, abdominal distention, lower extremity edema.  He has been not compliant with outpatient hemodialysis and outpatient paracentesis.   He was found to be severely volume overloaded on presentation and on acute respiratory failure with hypoxia and hypercarbia secondary to pulmonary edema.  His blood pressure was 147/75, HT 90, RR 23 and 02 saturation 91% on supplemental 02 per [place], lungs with bilateral rales with no wheezing, heart with S1 and S2 present and regular, abdomen non tender, positive ascites, positive lower extremity edema.   Na 142, K 5.9 Cl 97, bicarbonate 32, glucose 132 bun 66 cr 9,5  AST 43 ALT 35 total bilirubin 1.5  Wbc 5.4 hgb 8,9 plt 231  Sars covid 19 negative   Chest radiograph with cardiomegaly with diffuse extensive interstitial infiltrates bilaterally (homogenous), with bilateral pleural effusions.   12/11 left knee aspiration and steroid injection.   Nephrology consulted  for serial dialysis.   Patient is from SNF.  Patient has persistent weakness in bilateral lower extremities, imagings did not support any fracture or dislocation. PT/OT consulted , recommendation is to go back to SNF.    12/14 volume status is improving with ultrafiltration, plan to return to SNF when euvolemic state.  12/16 paracentesis 3,9 L removed with good toleration.  12/17 patient had HD with good toleration.   Assessment and Plan: ESRD on dialysis (HCC) Volume overload. Hyponatremia,  hyperkalemia   Pre HD BUN 113, K 5.4 and serum bicarbonate at 31  Na 137   Patient having HD with good toleration, aggressive ultrafiltration for volume removal.  Fluid restriction.  He is not yet back to his dry weight.  Continue to follow up nephrology recommendations regards of inpatient renal replacement therapy.   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   Acute metabolic encephalopath has resolved.  Decreased frequency and dose of opiate analgesics.  Decreased dose of cyclobenzaprine and discontinue gabapentin, with improvement in his mentation.   Continue neuro checks per unit protocol.  Patient very debilitated.  Essential hypertension Continue blood pressure control with hydralazine/ isosorbide, carvedilol and entresto   Cirrhosis (HCC) 12/02 paracentesis 3 L removed.  12/16 paracentesis 3.9 L removed.   He gets outpatient paracentesis routinely every 3 to  4 weeks. Patient complains of persistent ascites, will check abdominal US, if recurrent fluid accumulation may need to repeat paracentesis.   Acute on chronic HFrEF (heart failure with reduced ejection fraction) (HCC) Echocardiogram 06/2023 (care everywhere) preserved LV systolic function with EF 55 to 60%, mild LVH, possible loculated inferior small size pericardial effusion, RV with normal size, and normal systolic function, LA and RA with normal size, no significant valvular disease.    Continue ultrafiltration with hemodialysis  Continue blood pressure monitoring   Continue with carvedilol and entresto After load reduction with hydralazine and isosorbide.  Off telemetry.    Knee pain Bilateral knee pain and back pain, with ambulatory dysfunction. Pelvic fracture has been ruled out.  12/11 left knee aspiration and injection.  Positive ambulatory dysfunction.   Uncontrolled type 1 diabetes mellitus with hyperglycemia, with long-term current use of insulin  (HCC) Continue insulin sliding scale for glucose cover and monitoring. Continue basal insulin.  Fasting glucose 113 mg/ dl today.  Capillary glucose has been controlled.         Subjective: Patient with improvement in edema, he continue to complain of ascites. Knee pain has improved, he would like to go home and not return to SNF   Physical Exam: Vitals:   08/21/23 1230 08/21/23 1236 08/21/23 1245 08/21/23 1246  BP: 118/68 127/83 129/68   Pulse:  87    Resp: 13 17 12    Temp:  (!) 97.5 F (36.4 C)    TempSrc:  Oral    SpO2: 96% 96% 97%   Weight:    (!) 149.9 kg  Height:       Neurology awake and alert ENT with mild pallor Cardiovascular with S1 and S2 present and regular with no gallops, rubs or murmurs No JVD Positive lower extremity edema Respiratory with no rales or wheezing, no rhonchi Abdomen with no distention, dull to percussion at the dependent zones, noted abdominal wall edema    Data Reviewed:    Family Communication: no family at the bedside   Disposition: Status is: Inpatient Remains inpatient appropriate because: ultrafiltration   Planned Discharge Destination: Home    Author: Coralie Keens, MD 08/21/2023 1:18 PM  For on call review [email or web address].

## 2023-08-21 NOTE — Progress Notes (Addendum)
PT Cancellation Note  Patient Details Name: Bobby [name] MRN: [id number] DOB: [birth year]   Cancelled Treatment:    Reason Eval/Treat Not Completed: Patient at procedure or test/unavailable (pt off unit at HD, will follow up at later date/time as schedule allows and pt able.) Re-attempted in PM for visit and pt declined despite education on importance of mobility and discussion of pt's goals for mobility and function. Pt continued to decline siting tired and drained from HD. Educated pt on 3 refusal rule for therapy and importance of mobilizing next visit. Will follow up at later date/time as schedule allows.  Wynn Maudlin, DPT Acute Rehabilitation Services Office [phone]  08/21/23 4:06 PM

## 2023-08-22 DIAGNOSIS — J9602 Acute respiratory failure with hypercapnia: Secondary | ICD-10-CM | POA: Diagnosis not present

## 2023-08-22 DIAGNOSIS — J9601 Acute respiratory failure with hypoxia: Secondary | ICD-10-CM

## 2023-08-22 LAB — GLUCOSE, CAPILLARY
Glucose-Capillary: 168 mg/dL — ABNORMAL HIGH (ref 70–99)
Glucose-Capillary: 125 mg/dL — ABNORMAL HIGH (ref 70–99)
Glucose-Capillary: 115 mg/dL — ABNORMAL HIGH (ref 70–99)
Glucose-Capillary: 156 mg/dL — ABNORMAL HIGH (ref 70–99)

## 2023-08-22 LAB — RENAL FUNCTION PANEL
Albumin: 2.4 g/dL — ABNORMAL LOW (ref 3.5–5.0)
Anion gap: 14 (ref 5–15)
BUN: 90 mg/dL — ABNORMAL HIGH (ref 6–20)
CO2: 30 mmol/L (ref 22–32)
Calcium: 9.3 mg/dL (ref 8.9–10.3)
Chloride: 93 mmol/L — ABNORMAL LOW (ref 98–111)
Creatinine, Ser: 5.97 mg/dL — ABNORMAL HIGH (ref 0.61–1.24)
GFR, Estimated: 12 mL/min — ABNORMAL LOW (ref >60)
Glucose, Bld: 226 mg/dL — ABNORMAL HIGH (ref 70–99)
Phosphorus: 6.9 mg/dL — ABNORMAL HIGH (ref 2.5–4.6)
Potassium: 4.9 mmol/L (ref 3.5–5.1)
Sodium: 137 mmol/L (ref 135–145)

## 2023-08-22 MED ORDER — NAPHAZOLINE-GLYCERIN 0.012-0.25 % OP SOLN: 1.0000 [drp] | Freq: Four times a day (QID) | OPHTHALMIC | Status: DC | PRN

## 2023-08-22 NOTE — Progress Notes (Signed)
Physical Therapy Treatment Patient Details Name: Bobby [name] MRN: [id number] DOB: [birth year] Today's Date: 08/22/2023   History of Present Illness Pt is 27 yo presenting to [facility] ED with diffuse muscle aches after missing HD session on Tuesday. Also reporting dyspnea on exertion, worsening abdominal swelling and leg swelling. He was found to be severely volume overloaded on presentation with acute respiratory failure with hypoxia and hypercarbia secondary to pulmonary edema.  Lab work showed hyperkalemia.  Nephrology consulted for serial dialysis.  Patient is from SNF. Patient has persistent weakness in bilateral lower extremities, imagings did not support any fracture or dislocation and LE ultrasound negative for DVT. PMH includes recurrent ascites requiring paracentesis, ESRD (HD TTS), HFrEF (LVEF ~50%), type 1 diabetes, HTN, obesity.    PT Comments  Patient resting in bed and therapist arrived at agreed time for therapy visit. Patient requires education on importance of mobilizing on daily basis to progress towards goals. Session focused on tilting for increased WB'ing through LE's and completion of functional tasks/strengthening. Pt completed functional squats and abdominal sit ups. Self care task completed for pt to apply lotion to LE's and massage knees for pain management. Pt had greatest difficulty with contralateral reaching to remove tape from bil feet to simulate functional reach for lower body dressing. Pt required min assist to facilitate forward trunk/contralateral weight shift with greater assist to reach Lt UE to Rt. EOS pt c/o bil LE/knee pain and light retrograde massage performed to manage edema and pain. Education provided on retrograde massage technique and purpose for pt to instruct family. Will continue to progress pt as able throughout stay.    If plan is discharge home, recommend the following: Two people to help with walking and/or transfers;Help with stairs or ramp for  entrance;A lot of help with bathing/dressing/bathroom;Assist for transportation;Assistance with cooking/housework   Can travel by private vehicle     No  Equipment Recommendations  None recommended by PT    Recommendations for Other Services       Precautions / Restrictions Precautions Precautions: Fall Precaution Comments: LE weakness, LE knee pain, LE edema Restrictions Weight Bearing Restrictions Per Provider Order: No     Mobility  Bed Mobility               General bed mobility comments: total A use of tilt bed. Able to use BUE to pull self up in bed today and complete squat in tilted/verticalized position Start Time: 1150 Angle: 45 degrees (15 (0kg), 30 (31 kg), 45 (68kg), 33 (34 kg)) Total Minutes in Angle: 20 minutes Patient Response: Cooperative  Transfers                        Ambulation/Gait                   Stairs             Wheelchair Mobility     Tilt Bed Tilt Bed Patient on Tilt Bed?: Yes Start Time: 1150 Angle: 45 degrees (15 (0kg), 30 (31 kg), 45 (68kg), 33 (34 kg)) Total Minutes in Angle: 20 minutes Patient Response: Cooperative  Modified Rankin (Stroke Patients Only)       Balance                                            Cognition Arousal: Alert Behavior During  Therapy: WFL for tasks assessed/performed Overall Cognitive Status: Within Functional Limits for tasks assessed                                          Exercises Other Exercises Other Exercises: retrograde light manual massage bil LE's foot to knee to manage pain and edema Other Exercises: mini squats on tilt bed at 45 degrees with 68KG weight bearing 2x5 reps Other Exercises: 10x sit ups from tilt bed at 35 degrees with pt elevating trunk off bed and passing blanket back and fourth with PT. Cues for optimal technique and engagement of core. Other Exercises: on tilt bed at 35 degrees sit up and reaching  toward BLE across body with contralateral hand to foot and removing tape from socks. Cues for breaking down task and optimal body mechanics 3 pieces of tape bil LE.    General Comments        Pertinent Vitals/Pain Pain Assessment Pain Assessment: Faces Faces Pain Scale: Hurts even more Pain Location: Bil knees, abdomen Pain Descriptors / Indicators: Grimacing, Guarding, Sore Pain Intervention(s): Limited activity within patient's tolerance, Monitored during session, Repositioned, Other (comment) (manual, soft tissue mobilization)    Home Living                          Prior Function            PT Goals (current goals can now be found in the care plan section) Acute Rehab PT Goals PT Goal Formulation: With patient/family Time For Goal Achievement: 09/05/23 (extended) Potential to Achieve Goals: Fair Progress towards PT goals: Progressing toward goals    Frequency    Min 1X/week      PT Plan      Co-evaluation PT/OT/SLP Co-Evaluation/Treatment: Yes Reason for Co-Treatment: For patient/therapist safety;To address functional/ADL transfers PT goals addressed during session: Mobility/safety with mobility;Proper use of DME;Strengthening/ROM OT goals addressed during session: ADL's and self-care;Proper use of Adaptive equipment and DME      AM-PAC PT "6 Clicks" Mobility   Outcome Measure  Help needed turning from your back to your side while in a flat bed without using bedrails?: A Little Help needed moving from lying on your back to sitting on the side of a flat bed without using bedrails?: A Lot Help needed moving to and from a bed to a chair (including a wheelchair)?: Total Help needed standing up from a chair using your arms (e.g., wheelchair or bedside chair)?: Total Help needed to walk in hospital room?: Total Help needed climbing 3-5 steps with a railing? : Total 6 Click Score: 9    End of Session Equipment Utilized During Treatment:  Oxygen Activity Tolerance: Patient tolerated treatment well;Patient limited by pain (required max encouragement) Patient left: in bed;with call bell/phone within reach Nurse Communication: Mobility status;Need for lift equipment PT Visit Diagnosis: Muscle weakness (generalized) (M62.81);Other abnormalities of gait and mobility (R26.89)     Time: 9604-5409 PT Time Calculation (min) (ACUTE ONLY): 38 min  Charges:    $Therapeutic Exercise: 8-22 mins $Therapeutic Activity: 8-22 mins PT General Charges $$ ACUTE PT VISIT: 1 Visit                     Wynn Maudlin, DPT Acute Rehabilitation Services Office [phone]  08/22/23 4:41 PM

## 2023-08-22 NOTE — Plan of Care (Signed)
  Problem: Education: Goal: Knowledge of General Education information will improve Description Including pain rating scale, medication(s)/side effects and non-pharmacologic comfort measures Outcome: Progressing   Problem: Health Behavior/Discharge Planning: Goal: Ability to manage health-related needs will improve Outcome: Progressing   

## 2023-08-22 NOTE — Progress Notes (Signed)
Contacted [facility] to inquire how pt is normally transported to/from HD. Will await response from clinic. Will assist as needed.   Olivia Canter Renal Navigator [phone]  Addendum on 12/19 at 11:13 pm: Advised by clinic staff that pt arrives to clinic via w/c and hoyer lift is used to transfer pt from w/c to HD chair.

## 2023-08-22 NOTE — Progress Notes (Signed)
Occupational Therapy Treatment Patient Details Name: Bobby [name] MRN: [id number] DOB: 12/22/1995 Today's Date: 08/22/2023   History of present illness Pt is 27 yo presenting to [facility] ED with diffuse muscle aches after missing HD session on Tuesday. Also reporting dyspnea on exertion, worsening abdominal swelling and leg swelling. He was found to be severely volume overloaded on presentation with acute respiratory failure with hypoxia and hypercarbia secondary to pulmonary edema.  Lab work showed hyperkalemia.  Nephrology consulted for serial dialysis.  Patient is from SNF. Patient has persistent weakness in bilateral lower extremities, imagings did not support any fracture or dislocation and LE ultrasound negative for DVT. PMH includes recurrent ascites requiring paracentesis, ESRD (HD TTS), HFrEF (LVEF ~50%), type 1 diabetes, HTN, obesity.   OT comments  Pt progressing toward established OT goals slowly. Updated this session. Focus session on establishing weightbearing through BLE, core strength, and ADL retraining, see therapeutic exercises below. Pt continues to benefit from heavy education regarding mobility progression. Patient will benefit from continued inpatient follow up therapy, <3 hours/day       If plan is discharge home, recommend the following:  Two people to help with walking and/or transfers;Two people to help with bathing/dressing/bathroom;Assistance with cooking/housework;Assist for transportation;Help with stairs or ramp for entrance   Equipment Recommendations  Wheelchair cushion (measurements OT);Wheelchair (measurements OT);Hospital bed;Hoyer lift    Recommendations for Other Services      Precautions / Restrictions Precautions Precautions: Fall Precaution Comments: LE weakness, LE knee pain, LE edema Restrictions Weight Bearing Restrictions Per Provider Order: No       Mobility Bed Mobility               General bed mobility comments: total A use of  tilt bed. Able to use BUE to pull self up in bed today    Transfers                         Balance                                           ADL either performed or assessed with clinical judgement   ADL Overall ADL's : Needs assistance/impaired                     Lower Body Dressing: Maximal assistance;Bed level Lower Body Dressing Details (indicate cue type and reason): use of tilt bed in modified standing position with squatting down and reaching to feet. Would be max A for donning sock but able to reach toward feet to remove piece of tape off sock 3x ea leg taking bultiple attempts each               General ADL Comments: focus session on use of tilt bed see below    Extremity/Trunk Assessment              Vision       Perception     Praxis      Cognition Arousal: Alert Behavior During Therapy: WFL for tasks assessed/performed Overall Cognitive Status: Within Functional Limits for tasks assessed                                 General Comments: continuously needs education to optimize health literacy as it relates to  importance of therapy as it relates to return to PLOF        Exercises Exercises: Other exercises Other Exercises Other Exercises: retrograde light manual massage bil LE's foot to knee to manage pain and edema Other Exercises: mini squats on tilt bed at 45 degrees with 68KG weight bearing 10x Other Exercises: sit ups from tilt bed at 35 degrees with pt elevating trunk off bed and passing blanket back and fourth with PT. Cues for optimal technique and engagement of core Other Exercises: on tild bed at 35 degrees sit up and reaching toward BLE across body with contralateral hand to foot and removing tape from socks. Cues for breaking down task and optimal body mechanics 3 pieces of tape each foot    Shoulder Instructions       General Comments      Pertinent Vitals/ Pain       Pain  Assessment Pain Assessment: Faces Faces Pain Scale: Hurts even more Pain Location: Bil knees, abdomen Pain Descriptors / Indicators: Grimacing, Guarding, Sore Pain Intervention(s): Limited activity within patient's tolerance, Monitored during session  Home Living                                          Prior Functioning/Environment              Frequency  Min 1X/week        Progress Toward Goals  OT Goals(current goals can now be found in the care plan section)  Progress towards OT goals: Progressing toward goals  Acute Rehab OT Goals Patient Stated Goal: get better OT Goal Formulation: With patient Time For Goal Achievement: 09/05/23 Potential to Achieve Goals: Fair  Plan      Co-evaluation    PT/OT/SLP Co-Evaluation/Treatment: Yes Reason for Co-Treatment: For patient/therapist safety;To address functional/ADL transfers PT goals addressed during session: Mobility/safety with mobility;Proper use of DME;Strengthening/ROM OT goals addressed during session: ADL's and self-care;Proper use of Adaptive equipment and DME      AM-PAC OT "6 Clicks" Daily Activity     Outcome Measure   Help from another person eating meals?: None Help from another person taking care of personal grooming?: A Little Help from another person toileting, which includes using toliet, bedpan, or urinal?: Total Help from another person bathing (including washing, rinsing, drying)?: A Lot Help from another person to put on and taking off regular upper body clothing?: A Little Help from another person to put on and taking off regular lower body clothing?: Total 6 Click Score: 14    End of Session    OT Visit Diagnosis: Unsteadiness on feet (R26.81);Muscle weakness (generalized) (M62.81);Other abnormalities of gait and mobility (R26.89);Pain Pain - Right/Left: Left Pain - part of body: Knee   Activity Tolerance Patient tolerated treatment well   Patient Left in bed;with  call bell/phone within reach   Nurse Communication Mobility status        Time: 1610-9604 OT Time Calculation (min): 39 min  Charges: OT General Charges $OT Visit: 1 Visit OT Treatments $Therapeutic Activity: 8-22 mins  Myrla Halsted, OTD, OTR/L [facility] Acute Rehabilitation Office: [phone]   Myrla Halsted 08/22/2023, 2:00 PM

## 2023-08-22 NOTE — Progress Notes (Addendum)
[facility] Progress Note   Subjective:   Seen in room - no overnight issues. Still unable to stand for weight.  Objective Vitals:   08/21/23 1246 08/21/23 1400 08/21/23 2125 08/22/23 0800  BP:  124/78 (!) 116/58 133/77  Pulse:   83 88  Resp:  12 18 20   Temp:  (!) 97.5 F (36.4 C) 97.9 F (36.6 C) 98 F (36.7 C)  TempSrc:  Oral Oral Oral  SpO2:  98% 98% 98%  Weight: (!) 149.9 kg     Height:       Physical Exam General: Chronically ill appearing man, NAD. + facial edema Heart: RRR; no murmur Lungs: CTA anteriorly Abdomen: distended, non-tender.  Extremities: 2+ tense BLE edema; distal legs with nearly black hyperpigmentation. Scattered nodules - c/w prurigo nodularis, no blisters or ulcerations Dialysis Access: AVF + t/b  Additional Objective Labs: Basic Metabolic Panel: Recent Labs  Lab 08/20/23 0226 08/21/23 0238 08/22/23 0245  NA 136 137 137  K 4.9 5.4* 4.9  CL 94* 92* 93*  CO2 30 31 30   GLUCOSE 151* 149* 226*  BUN 98* 113* 90*  CREATININE 6.28* 7.23* 5.97*  CALCIUM 9.4 9.7 9.3  PHOS 6.6* 7.8* 6.9*   Liver Function Tests: Recent Labs  Lab 08/20/23 0226 08/21/23 0238 08/22/23 0245  ALBUMIN 2.5* 2.5* 2.4*   CBC: Recent Labs  Lab 08/18/23 2207  WBC 6.7  HGB 9.7*  HCT 32.1*  MCV 91.7  PLT 362  Studies/Results: US Abdomen Limited Result Date: 08/21/2023 CLINICAL DATA:  Evaluate for ascites. EXAM: LIMITED ABDOMEN ULTRASOUND FOR ASCITES TECHNIQUE: Limited ultrasound survey for ascites was performed in all four abdominal quadrants. COMPARISON:  Ultrasound paracentesis 08/20/2023 FINDINGS: There is a moderate-to-large amount of ascites in all 4 abdominal quadrants. Bilateral pleural effusions are present. IMPRESSION: 1. Moderate-to-large amount of ascites in all 4 abdominal quadrants. 2. Bilateral pleural effusions. Electronically Signed   By: Darliss Cheney M.D.   On: 08/21/2023 23:13   Medications:   carvedilol  25 mg Oral BID WC    Chlorhexidine Gluconate Cloth  6 each Topical Q0600   darbepoetin (ARANESP) injection - DIALYSIS  200 mcg Subcutaneous Q Mon-1800   feeding supplement (NEPRO CARB STEADY)  237 mL Oral BID BM   heparin  5,000 Units Subcutaneous Q8H   hydrALAZINE  100 mg Oral TID   insulin aspart  0-15 Units Subcutaneous TID WC   insulin aspart  0-5 Units Subcutaneous QHS   insulin aspart  3 Units Subcutaneous TID WC   insulin glargine-yfgn  5 Units Subcutaneous Daily   isosorbide mononitrate  90 mg Oral Daily   loratadine  10 mg Oral Daily   polyethylene glycol  17 g Oral Daily   sacubitril-valsartan  1 tablet Oral BID   sucroferric oxyhydroxide  1,000 mg Oral TID WC    Dialysis Orders: TS - HP 4:15hr, 500/A2, EDW 112kg, 2K/2Ca, AVF, heparin 10K bolus - Mircera q 2 weeks - Hectoral q HD - Venofer course was given prior to admit   Assessment/Plan: 1. Chronic/severe volume overload: Serial HD this admit, per scale no improvement. Frustrating and unexplained, has to be drinking more than supposed to. Need to get a standing weight on him if possible, has been too weak to complete. Abd [place] yesterday - still with B pleural effusions and large ascites. 2. ESRD: Serial HD on admit, now just TTS due to high dialysis census. Next HD tomorrow - 6L UFG q HD. 3. HTN/volume:  BP ok, chronic edema. See above. 4. Anemia: Hgb 9.7 - continue Aranesp q Monday while here. 5. Secondary hyperparathyroidism:  CorrCa high, Phos high. VDRA on hold, continue Velphoro as binders (refusing on most days) - encouraged. 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 6. Nutrition:  Alb low, renal diet with fluid restrictions 7. T1DM: On insulin. 8. 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 9. LE weakness/pain: No fractures, result of #1 10. Dispo:  Unfortunately weight exactly the same (or more) from admit. Plan is SNF on discharge.  Bobby Hoyle, PA-C 08/22/2023, 1:08 PM  [employer]

## 2023-08-22 NOTE — Progress Notes (Signed)
Patient educated about diet intake regarding refusal of insulin. After teachings , patient was okay with administration. Patient also educated on the importance of fluid restrictions. Patient refuse to follow restriction orders. MD notified and aware. I informed the patient that I contacted pharmacy about velphoro to see if they could order another form. Pharmacy stated it is not available in another form. Patient understood. Patient educated and continues to refuse velphoro sometimes. Patient currently stable in bed.

## 2023-08-22 NOTE — Progress Notes (Signed)
Mobility Specialist Progress Note:    08/22/23 1159  Mobility  Activity  (bed mobility)  Level of Assistance Maximum assist, patient does 25-49% (+2)  Assistive Device Other (Comment) (HHA)  Activity Response Tolerated well  Mobility Referral Yes  Mobility visit 1 Mobility  Mobility Specialist Start Time (ACUTE ONLY) 0915  Mobility Specialist Stop Time (ACUTE ONLY) [id number]  Mobility Specialist Time Calculation (min) (ACUTE ONLY) 13 min   NT requested assistance repositioning patient in bed. Pt was able to move UE w/o fault but needed assistance w/ his LE. Pt was able to pull themselves up a tad in bed. Mobility specialist and NT helped w/ the rest. Pt c/o pain all over unable to pinpoint where exactly the pain is coming from. Call bell and personal belongings in reach. All needs met.  Thompson Grayer Mobility Specialist  Please contact vis Secure Chat or  Rehab Office [phone]

## 2023-08-23 DIAGNOSIS — J9601 Acute respiratory failure with hypoxia: Secondary | ICD-10-CM | POA: Diagnosis not present

## 2023-08-23 DIAGNOSIS — J9602 Acute respiratory failure with hypercapnia: Secondary | ICD-10-CM | POA: Diagnosis not present

## 2023-08-23 LAB — GLUCOSE, CAPILLARY
Glucose-Capillary: 138 mg/dL — ABNORMAL HIGH (ref 70–99)
Glucose-Capillary: 86 mg/dL (ref 70–99)
Glucose-Capillary: 108 mg/dL — ABNORMAL HIGH (ref 70–99)
Glucose-Capillary: 96 mg/dL (ref 70–99)
Glucose-Capillary: 157 mg/dL — ABNORMAL HIGH (ref 70–99)
Glucose-Capillary: 115 mg/dL — ABNORMAL HIGH (ref 70–99)

## 2023-08-23 LAB — CBC
HCT: 33.5 % — ABNORMAL LOW (ref 39.0–52.0)
Hemoglobin: 10.3 g/dL — ABNORMAL LOW (ref 13.0–17.0)
MCH: 28.1 pg (ref 26.0–34.0)
MCHC: 30.7 g/dL (ref 30.0–36.0)
MCV: 91.5 fL (ref 80.0–100.0)
Platelets: 293 10*3/uL (ref 150–400)
RBC: 3.66 MIL/uL — ABNORMAL LOW (ref 4.22–5.81)
RDW: 18.1 % — ABNORMAL HIGH (ref 11.5–15.5)
WBC: 5.7 10*3/uL (ref 4.0–10.5)
nRBC: 0 % (ref 0.0–0.2)

## 2023-08-23 LAB — BASIC METABOLIC PANEL
Anion gap: 14 (ref 5–15)
BUN: 105 mg/dL — ABNORMAL HIGH (ref 6–20)
CO2: 33 mmol/L — ABNORMAL HIGH (ref 22–32)
Calcium: 9.3 mg/dL (ref 8.9–10.3)
Chloride: 92 mmol/L — ABNORMAL LOW (ref 98–111)
Creatinine, Ser: 6.73 mg/dL — ABNORMAL HIGH (ref 0.61–1.24)
GFR, Estimated: 11 mL/min — ABNORMAL LOW (ref >60)
Glucose, Bld: 209 mg/dL — ABNORMAL HIGH (ref 70–99)
Potassium: 4.9 mmol/L (ref 3.5–5.1)
Sodium: 139 mmol/L (ref 135–145)

## 2023-08-23 MED ORDER — ALTEPLASE 2 MG IJ SOLR: 2.0000 mg | Freq: Once | INTRAMUSCULAR | Status: DC | PRN

## 2023-08-23 MED ORDER — LIDOCAINE HCL (PF) 1 % IJ SOLN: 5.0000 mL | INTRAMUSCULAR | Status: DC | PRN

## 2023-08-23 MED ORDER — ALBUMIN HUMAN 25 % IV SOLN
50.0000 g | Freq: Once | INTRAVENOUS | Status: AC
  Administered 2023-08-23: 50 g via INTRAVENOUS
  Filled 2023-08-23: qty 200

## 2023-08-23 MED ORDER — HEPARIN SODIUM (PORCINE) 1000 UNIT/ML DIALYSIS
10000.0000 [IU] | Freq: Once | INTRAMUSCULAR | Status: AC
  Administered 2023-08-23: 10000 [IU] via INTRAVENOUS_CENTRAL
  Filled 2023-08-23: qty 10

## 2023-08-23 MED ORDER — FERRIC CITRATE 1 GM 210 MG(FE) PO TABS
420.0000 mg | ORAL_TABLET | Freq: Three times a day (TID) | ORAL | Status: DC
  Administered 2023-08-23 – 2023-08-24 (×2): 420 mg via ORAL
  Filled 2023-08-23 (×4): qty 2

## 2023-08-23 MED ORDER — ANTICOAGULANT SODIUM CITRATE 4% (200MG/5ML) IV SOLN: 5.0000 mL | Status: DC | PRN

## 2023-08-23 NOTE — Plan of Care (Signed)
  Problem: Education: Goal: Knowledge of General Education information will improve Description Including pain rating scale, medication(s)/side effects and non-pharmacologic comfort measures Outcome: Progressing   Problem: Health Behavior/Discharge Planning: Goal: Ability to manage health-related needs will improve Outcome: Progressing   

## 2023-08-23 NOTE — Progress Notes (Signed)
Received patient in bed.Awake,alert and oriented x 4. C Onsent verified.  Access used :Left arm AVF that worked well.  Duration of treatment :4 hours.  Net UF 6 liters..  Tolerated tx: Yes.  Medicine given : Percocet 1 tab.                            Heparin 10,000 units pre-run dose.                             Hydromorphone 0.5 mg.  Hand off to the patient's nurse.Back into his room with stable medical condition via transporter.

## 2023-08-23 NOTE — Progress Notes (Signed)
PROGRESS NOTE    Etsel Shed  ZOX:[id number] DOB: 12-Apr-1996 DOA: 08/04/2023 PCP: [facility]  27/M w ESRD on dialysis on TTS schedule, morbid obesity, systolic CHF, cirrhosis, diabetes type 1, recurrent ascites requiring paracentesis, HTN, anxiety who presented with myalgia, shortness of breath, abdominal distention, lower extremity edema.  Noncompliant with outpatient hemodialysis and outpatient paracentesis.   -Found to be severely volume overloaded on presentation and on acute respiratory failure with hypoxia and hypercarbia secondary to pulmonary edema. bun 66 cr 9,5  CXR w diffuse extensive interstitial infiltrates bilaterally (homogenous), with bilateral pleural effusions.  12/11 left knee aspiration and steroid injection.  Patient is from SNF.  Patient has persistent weakness in bilateral lower extremities, imagings did not support any fracture or dislocation. PT/OT consulted , recommendation is to go back to SNF.  12/14 volume status is improving with ultrafiltration, however weight remains largely unchanged 12/16 paracentesis 3,9 L removed 12/17 patient had HD  Subjective: Feels okay, still significantly volume overloaded, discussed importance of fluid restriction  Assessment and Plan:  Acute hypoxic respiratory failure ESRD on dialysis (HCC) Volume overload. Hyponatremia, hyperkalemia  -Undergoing serial HD>aggressive ultrafiltration for volume removal.  -still vol overloaded -per Nephrology -Emphasized fluid restriction with patient and RN, weight finally starting to improve a bit -Repeat paracentesis today  Anemia of chronic renal disease with iron deficiency anemia Continue with IV iron and EPO.   Acute metabolic encephalopathy has resolved.  Decreased frequency and dose of opiate analgesics.  Decreased dose of cyclobenzaprine and discontinued gabapentin, with improvement in his mentation.   Essential hypertension Continue hydralazine/  isosorbide, carvedilol and entresto   Decompensated cirrhosis (HCC) 12/02 paracentesis 3 L removed.  12/16 paracentesis 3.9 L removed.  -gets outpatient paracentesis routinely every 3 to  4 weeks. -Will repeat paracentesis with albumin today  Acute on chronic diastolic CHF Echocardiogram 06/2023 (care everywhere) preserved LV systolic function with EF 55 to 60%, mild LVH, possible loculated inferior small size pericardial effusion, RV with normal size, and normal systolic function, LA and RA with normal size, no significant valvular disease.   -Continue ultrafiltration with hemodialysis and intermittent paracentesis -carvedilol, entresto, hydralazine and isosorbide.   Knee pain Bilateral knee pain and back pain, with ambulatory dysfunction. 12/11 left knee aspiration and injection.  No crystals seen, hemarthrosis  Positive ambulatory dysfunction.   Uncontrolled type 1 diabetes mellitus with hyperglycemia, with long-term current use of insulin (HCC) CBGs are stable now, continue basal insulin, SSI  DVT prophylaxis: Hep SQ Code Status: Full Code Family Communication: No family at bedside Disposition Plan:-Volume status has improved  Consultants:    Procedures:   Antimicrobials:    Objective: Vitals:   08/23/23 1100 08/23/23 1130 08/23/23 1200 08/23/23 1230  BP: (!) 141/75 (!) 152/122 (!) 148/76 132/60  Pulse: (!) 40     Resp: 16 16 13    Temp:      TempSrc:      SpO2: 95% 100% 98%   Weight:      Height:        Intake/Output Summary (Last 24 hours) at 08/23/2023 1240 Last data filed at 08/23/2023 1230 Gross per 24 hour  Intake --  Output 6000 ml  Net -6000 ml   Filed Weights   08/20/23 0653 08/21/23 0814 08/21/23 1246  Weight: (!) 158.7 kg (!) 155.9 kg (!) 149.9 kg    Examination:  Obese chronically ill male sitting up in bed, AAOx3, cognitive deficits HEENT: Positive JVD CVS: S1-S2, regular rhythm Lungs:  Decreased breath sounds at the bases Abdomen:  Obese, nontender, positive fluid thrill Extremities: 2-3+ brawny edema    Data Reviewed:   CBC: Recent Labs  Lab 08/18/23 2207 08/23/23 0730  WBC 6.7 5.7  HGB 9.7* 10.3*  HCT 32.1* 33.5*  MCV 91.7 91.5  PLT 362 293   Basic Metabolic Panel: Recent Labs  Lab 08/18/23 2207 08/19/23 0313 08/20/23 0226 08/21/23 0238 08/22/23 0245 08/23/23 0227  NA 132* 130* 136 137 137 139  K 5.3* 4.4 4.9 5.4* 4.9 4.9  CL 90* 92* 94* 92* 93* 92*  CO2 25 26 30 31 30  33*  GLUCOSE 187* 166* 151* 149* 226* 209*  BUN 104* 80* 98* 113* 90* 105*  CREATININE 6.63* 5.12* 6.28* 7.23* 5.97* 6.73*  CALCIUM 9.6 9.4 9.4 9.7 9.3 9.3  PHOS 7.0* 5.8* 6.6* 7.8* 6.9*  --    GFR: Estimated Creatinine Clearance: 26.2 mL/min (A) (by C-G formula based on SCr of 6.73 mg/dL (H)). Liver Function Tests: Recent Labs  Lab 08/18/23 2207 08/19/23 0313 08/20/23 0226 08/21/23 0238 08/22/23 0245  ALBUMIN 2.5* 2.7* 2.5* 2.5* 2.4*   No results for input(s): "LIPASE", "AMYLASE" in the last 168 hours. No results for input(s): "AMMONIA" in the last 168 hours. Coagulation Profile: No results for input(s): "INR", "PROTIME" in the last 168 hours. Cardiac Enzymes: No results for input(s): "CKTOTAL", "CKMB", "CKMBINDEX", "TROPONINI" in the last 168 hours. BNP (last 3 results) No results for input(s): "PROBNP" in the last 8760 hours. HbA1C: No results for input(s): "HGBA1C" in the last 72 hours. CBG: Recent Labs  Lab 08/22/23 1620 08/22/23 2118 08/23/23 0514 08/23/23 0904 08/23/23 1228  GLUCAP 115* 156* 138* 86 108*   Lipid Profile: No results for input(s): "CHOL", "HDL", "LDLCALC", "TRIG", "CHOLHDL", "LDLDIRECT" in the last 72 hours. Thyroid Function Tests: No results for input(s): "TSH", "T4TOTAL", "FREET4", "T3FREE", "THYROIDAB" in the last 72 hours. Anemia Panel: No results for input(s): "VITAMINB12", "FOLATE", "FERRITIN", "TIBC", "IRON", "RETICCTPCT" in the last 72 hours. Urine analysis:    Component  Value Date/Time   COLORURINE YELLOW 03/01/2021 0847   APPEARANCEUR CLEAR 03/01/2021 0847   LABSPEC 1.010 03/01/2021 0847   PHURINE 7.0 03/01/2021 0847   GLUCOSEU 150 (A) 03/01/2021 0847   HGBUR SMALL (A) 03/01/2021 0847   BILIRUBINUR NEGATIVE 03/01/2021 0847   KETONESUR 5 (A) 03/01/2021 0847   PROTEINUR >=300 (A) 03/01/2021 0847   NITRITE NEGATIVE 03/01/2021 0847   LEUKOCYTESUR NEGATIVE 03/01/2021 0847   Sepsis Labs: @LABRCNTIP (procalcitonin:4,lacticidven:4)  )No results found for this or any previous visit (from the past 240 hours).   Radiology Studies: US Abdomen Limited Result Date: 08/21/2023 CLINICAL DATA:  Evaluate for ascites. EXAM: LIMITED ABDOMEN ULTRASOUND FOR ASCITES TECHNIQUE: Limited ultrasound survey for ascites was performed in all four abdominal quadrants. COMPARISON:  Ultrasound paracentesis 08/20/2023 FINDINGS: There is a moderate-to-large amount of ascites in all 4 abdominal quadrants. Bilateral pleural effusions are present. IMPRESSION: 1. Moderate-to-large amount of ascites in all 4 abdominal quadrants. 2. Bilateral pleural effusions. Electronically Signed   By: Darliss Cheney M.D.   On: 08/21/2023 23:13     Scheduled Meds:  carvedilol  25 mg Oral BID WC   Chlorhexidine Gluconate Cloth  6 each Topical Q0600   darbepoetin (ARANESP) injection - DIALYSIS  200 mcg Subcutaneous Q Mon-1800   feeding supplement (NEPRO CARB STEADY)  237 mL Oral BID BM   ferric citrate  420 mg Oral TID WC   heparin  5,000 Units Subcutaneous Q8H   hydrALAZINE  100 mg Oral TID   insulin aspart  0-15 Units Subcutaneous TID WC   insulin aspart  0-5 Units Subcutaneous QHS   insulin aspart  3 Units Subcutaneous TID WC   insulin glargine-yfgn  5 Units Subcutaneous Daily   isosorbide mononitrate  90 mg Oral Daily   loratadine  10 mg Oral Daily   polyethylene glycol  17 g Oral Daily   sacubitril-valsartan  1 tablet Oral BID   Continuous Infusions:  albumin human     anticoagulant sodium  citrate       LOS: 19 days    Time spent:    Zannie Cove, MD [facility]   08/23/2023, 12:40 PM

## 2023-08-23 NOTE — Plan of Care (Signed)
  Problem: Education: Goal: Knowledge of General Education information will improve Description: Including pain rating scale, medication(s)/side effects and non-pharmacologic comfort measures Outcome: Progressing   Problem: Health Behavior/Discharge Planning: Goal: Ability to manage health-related needs will improve Outcome: Progressing   Problem: Clinical Measurements: Goal: Ability to maintain clinical measurements within normal limits will improve Outcome: Progressing Goal: Will remain free from infection Outcome: Progressing Goal: Diagnostic test results will improve Outcome: Progressing Goal: Respiratory complications will improve Outcome: Progressing Goal: Cardiovascular complication will be avoided Outcome: Progressing   Problem: Nutrition: Goal: Adequate nutrition will be maintained Outcome: Progressing   Problem: Pain Management: Goal: General experience of comfort will improve Outcome: Progressing   Problem: Skin Integrity: Goal: Risk for impaired skin integrity will decrease Outcome: Progressing   Problem: Education: Goal: Ability to describe self-care measures that may prevent or decrease complications (Diabetes Survival Skills Education) will improve Outcome: Progressing Goal: Individualized Educational Video(s) Outcome: Progressing   Problem: Coping: Goal: Ability to adjust to condition or change in health will improve Outcome: Progressing   Problem: Fluid Volume: Goal: Ability to maintain a balanced intake and output will improve Outcome: Progressing   Problem: Health Behavior/Discharge Planning: Goal: Ability to identify and utilize available resources and services will improve Outcome: Progressing Goal: Ability to manage health-related needs will improve Outcome: Progressing   Problem: Metabolic: Goal: Ability to maintain appropriate glucose levels will improve Outcome: Progressing   Problem: Nutritional: Goal: Progress toward achieving an  optimal weight will improve Outcome: Progressing   Problem: Tissue Perfusion: Goal: Adequacy of tissue perfusion will improve Outcome: Progressing

## 2023-08-23 NOTE — Progress Notes (Signed)
Physical Therapy Treatment Patient Details Name: Bobby [name] MRN: [id number] DOB: May 27, 1996 Today's Date: 08/23/2023   History of Present Illness Pt is 27 yo presenting to [facility] ED with diffuse muscle aches after missing HD session on Tuesday. Also reporting dyspnea on exertion, worsening abdominal swelling and leg swelling. He was found to be severely volume overloaded on presentation with acute respiratory failure with hypoxia and hypercarbia secondary to pulmonary edema.  Lab work showed hyperkalemia.  Nephrology consulted for serial dialysis.  Patient is from SNF. Patient has persistent weakness in bilateral lower extremities, imagings did not support any fracture or dislocation and LE ultrasound negative for DVT. PMH includes recurrent ascites requiring paracentesis, ESRD (HD TTS), HFrEF (LVEF ~50%), type 1 diabetes, HTN, obesity.    PT Comments  Patient recently returned from HD and agreeable to participate in bed level exercises due to fatigue and abdominal pain. Pt completed seated exercises with AAROM for quad activation and progressed to AROM as repetitions progressed. Pt c/o knee pain and retrograde massage performed. Bil LE's notably more firm and edematous this session, educated pt on elevating LE's as they are in dependent position with pt in chair position of bed. Continued education on importance of mobilizing and on plans for tilt/WB'ing at next visit. Pt verbalized understanding. Will continue to progress pt as able during stay.      If plan is discharge home, recommend the following: Two people to help with walking and/or transfers;Help with stairs or ramp for entrance;A lot of help with bathing/dressing/bathroom;Assist for transportation;Assistance with cooking/housework   Can travel by private vehicle     No  Equipment Recommendations  None recommended by PT    Recommendations for Other Services       Precautions / Restrictions Precautions Precautions:  Fall Precaution Comments: LE weakness, LE knee pain, LE edema Restrictions Weight Bearing Restrictions Per Provider Order: No     Mobility  Bed Mobility               General bed mobility comments: seated in chair position in bed, bed level exercise today         Stairs             Wheelchair Mobility     Tilt Bed    Modified Rankin (Stroke Patients Only)          Cognition Arousal: Alert Behavior During Therapy: WFL for tasks assessed/performed Overall Cognitive Status: Within Functional Limits for tasks assessed               Exercises General Exercises - Lower Extremity Long Arc Quad: AROM, Both, 10 reps, Seated, Limitations Long Arc Quad Limitations: 2x10 Toe Raises: AROM, Both, 15 reps, Seated, Limitations Toe Raises Limitations: 2x15 Heel Raises: AROM, Both, 15 reps, Seated, Limitations Heel Raises Limitations: 2x15 Other Exercises Other Exercises: shoulder extension/scapular retraction, 2x 10 reps    General Comments        Pertinent Vitals/Pain Pain Assessment Pain Assessment: Faces Faces Pain Scale: Hurts little more Pain Location: Bil knees, abdomen Pain Descriptors / Indicators: Grimacing, Guarding, Sore Pain Intervention(s): Limited activity within patient's tolerance, Monitored during session, Other (comment) (manual, soft tissue mobilization)     PT Goals (current goals can now be found in the care plan section) Acute Rehab PT Goals Patient Stated Goal: Hopeful to get home somehow, but needs rehab now PT Goal Formulation: With patient/family Time For Goal Achievement: 09/05/23 Potential to Achieve Goals: Fair Progress towards PT goals: Progressing toward goals  Frequency    Min 1X/week      PT Plan      Co-evaluation              AM-PAC PT "6 Clicks" Mobility   Outcome Measure  Help needed turning from your back to your side while in a flat bed without using bedrails?: A Little Help needed moving  from lying on your back to sitting on the side of a flat bed without using bedrails?: A Lot Help needed moving to and from a bed to a chair (including a wheelchair)?: Total Help needed standing up from a chair using your arms (e.g., wheelchair or bedside chair)?: Total Help needed to walk in hospital room?: Total Help needed climbing 3-5 steps with a railing? : Total 6 Click Score: 9    End of Session Equipment Utilized During Treatment: Oxygen Activity Tolerance: Patient tolerated treatment well;Patient limited by pain Patient left: in bed;with call bell/phone within reach Nurse Communication: Mobility status;Need for lift equipment PT Visit Diagnosis: Muscle weakness (generalized) (M62.81);Other abnormalities of gait and mobility (R26.89)     Time: 9323-5573 PT Time Calculation (min) (ACUTE ONLY): 26 min  Charges:    $Therapeutic Exercise: 8-22 mins $Massage: 8-22 mins PT General Charges $$ ACUTE PT VISIT: 1 Visit                     Wynn Maudlin, DPT Acute Rehabilitation Services Office [phone]  08/23/23 2:37 PM

## 2023-08-23 NOTE — Progress Notes (Addendum)
[facility] Progress Note   Subjective:   Seen on HD - 6L UFG and tolerating. Denies CP/dyspnea. Had told RN that he wanted to change to different phos binder - dislikes chewable one -> will change today.  Objective Vitals:   08/22/23 2120 08/23/23 0804 08/23/23 0830 08/23/23 0900  BP: 139/74 131/78 135/82 (!) 145/80  Pulse: 82 84    Resp: 20 13    Temp: 97.7 F (36.5 C) 98.1 F (36.7 C)    TempSrc: Oral Oral    SpO2: 97% 93%    Weight:      Height:       Physical Exam General: Chronically ill appearing man, NAD. + facial edema Heart: RRR; no murmur Lungs: CTA anteriorly Abdomen: distended, non-tender.  Extremities: 2+ tense BLE edema; distal legs with nearly black hyperpigmentation. Scattered nodules - c/w prurigo nodularis, no blisters or ulcerations Dialysis Access: AVF + t/b  Additional Objective Labs: Basic Metabolic Panel: Recent Labs  Lab 08/20/23 0226 08/21/23 0238 08/22/23 0245 08/23/23 0227  NA 136 137 137 139  K 4.9 5.4* 4.9 4.9  CL 94* 92* 93* 92*  CO2 30 31 30  33*  GLUCOSE 151* 149* 226* 209*  BUN 98* 113* 90* 105*  CREATININE 6.28* 7.23* 5.97* 6.73*  CALCIUM 9.4 9.7 9.3 9.3  PHOS 6.6* 7.8* 6.9*  --    Liver Function Tests: Recent Labs  Lab 08/20/23 0226 08/21/23 0238 08/22/23 0245  ALBUMIN 2.5* 2.5* 2.4*   CBC: Recent Labs  Lab 08/18/23 2207 08/23/23 0730  WBC 6.7 5.7  HGB 9.7* 10.3*  HCT 32.1* 33.5*  MCV 91.7 91.5  PLT 362 293   Studies/Results: US Abdomen Limited Result Date: 08/21/2023 CLINICAL DATA:  Evaluate for ascites. EXAM: LIMITED ABDOMEN ULTRASOUND FOR ASCITES TECHNIQUE: Limited ultrasound survey for ascites was performed in all four abdominal quadrants. COMPARISON:  Ultrasound paracentesis 08/20/2023 FINDINGS: There is a moderate-to-large amount of ascites in all 4 abdominal quadrants. Bilateral pleural effusions are present. IMPRESSION: 1. Moderate-to-large amount of ascites in all 4 abdominal quadrants. 2.  Bilateral pleural effusions. Electronically Signed   By: Darliss Cheney M.D.   On: 08/21/2023 23:13   Medications:  anticoagulant sodium citrate      carvedilol  25 mg Oral BID WC   Chlorhexidine Gluconate Cloth  6 each Topical Q0600   darbepoetin (ARANESP) injection - DIALYSIS  200 mcg Subcutaneous Q Mon-1800   feeding supplement (NEPRO CARB STEADY)  237 mL Oral BID BM   heparin  5,000 Units Subcutaneous Q8H   hydrALAZINE  100 mg Oral TID   insulin aspart  0-15 Units Subcutaneous TID WC   insulin aspart  0-5 Units Subcutaneous QHS   insulin aspart  3 Units Subcutaneous TID WC   insulin glargine-yfgn  5 Units Subcutaneous Daily   isosorbide mononitrate  90 mg Oral Daily   loratadine  10 mg Oral Daily   polyethylene glycol  17 g Oral Daily   sacubitril-valsartan  1 tablet Oral BID   sucroferric oxyhydroxide  1,000 mg Oral TID WC    Dialysis Orders: TTS - HP 4:15hr, 500/A2, EDW 112kg, 2K/2Ca, AVF, heparin 10K bolus - Mircera q 2 weeks - Hectoral q HD - Venofer course was given prior to admit   Assessment/Plan: 1. Chronic/severe volume overload: Serial HD this admit, per scale no improvement. Frustrating, has to be drinking more than supposed to. Need to get a standing weight on him if possible, has been too weak to complete.  Abd [place] 12/17 - still with B pleural effusions and large ascites. 2. ESRD: Serial HD on admit, now just TTS. HD now - 6L UFG q HD. 3. HTN/volume:  BP ok, chronic edema. See above. 4. Anemia: Hgb 10.3 - continue Aranesp q Monday while here. 5. Secondary hyperparathyroidism:  CorrCa high, Phos high. VDRA on hold. 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 Dislikes Velphoro binder -> change to [place] pills 2/meals. 6. Nutrition:  Alb low, renal diet with fluid restrictions 7. T1DM: On insulin. 8. Ascites: combination of cirrhosis  and nephrogenic ascites: S/p para 12/16, repeat para planned 12/19. 9. LE weakness/pain: No fractures, result of #1 10. Dispo: Unfortunately weight exactly the same (or more) from admit. Plan is SNF on discharge.  Addendum: Lowered fluids restrictions to 1200 mL/day and added strict in/out orders.  Ozzie Hoyle, PA-C 08/23/2023, 9:28 AM  [employer]

## 2023-08-24 ENCOUNTER — Inpatient Hospital Stay (HOSPITAL_COMMUNITY): Payer: Medicare Other

## 2023-08-24 DIAGNOSIS — J9601 Acute respiratory failure with hypoxia: Secondary | ICD-10-CM | POA: Diagnosis not present

## 2023-08-24 DIAGNOSIS — J9602 Acute respiratory failure with hypercapnia: Secondary | ICD-10-CM | POA: Diagnosis not present

## 2023-08-24 HISTORY — PX: IR PARACENTESIS: IMG2679

## 2023-08-24 LAB — BASIC METABOLIC PANEL
Anion gap: 12 (ref 5–15)
BUN: 81 mg/dL — ABNORMAL HIGH (ref 6–20)
CO2: 33 mmol/L — ABNORMAL HIGH (ref 22–32)
Calcium: 9.6 mg/dL (ref 8.9–10.3)
Chloride: 93 mmol/L — ABNORMAL LOW (ref 98–111)
Creatinine, Ser: 5.53 mg/dL — ABNORMAL HIGH (ref 0.61–1.24)
GFR, Estimated: 14 mL/min — ABNORMAL LOW (ref >60)
Glucose, Bld: 174 mg/dL — ABNORMAL HIGH (ref 70–99)
Potassium: 4.8 mmol/L (ref 3.5–5.1)
Sodium: 138 mmol/L (ref 135–145)

## 2023-08-24 LAB — GLUCOSE, CAPILLARY
Glucose-Capillary: 160 mg/dL — ABNORMAL HIGH (ref 70–99)
Glucose-Capillary: 154 mg/dL — ABNORMAL HIGH (ref 70–99)
Glucose-Capillary: 130 mg/dL — ABNORMAL HIGH (ref 70–99)
Glucose-Capillary: 129 mg/dL — ABNORMAL HIGH (ref 70–99)

## 2023-08-24 MED ORDER — ALBUMIN HUMAN 25 % IV SOLN
25.0000 g | Freq: Once | INTRAVENOUS | Status: AC
  Administered 2023-08-24: 25 g via INTRAVENOUS

## 2023-08-24 MED ORDER — LIDOCAINE HCL 1 % IJ SOLN
INTRAMUSCULAR | Status: AC
  Filled 2023-08-24: qty 20

## 2023-08-24 MED ORDER — SEVELAMER CARBONATE 800 MG PO TABS
1600.0000 mg | ORAL_TABLET | Freq: Three times a day (TID) | ORAL | Status: DC
  Administered 2023-08-24 – 2023-08-28 (×10): 1600 mg via ORAL
  Filled 2023-08-24 (×10): qty 2

## 2023-08-24 NOTE — Progress Notes (Signed)
PROGRESS NOTE    Bobby [name]  XBJ:[id number] DOB: [birth year] DOA: 08/04/2023 PCP: [facility]  27/M w ESRD on dialysis on TTS schedule, morbid obesity, systolic CHF, cirrhosis, diabetes type 1, recurrent ascites requiring paracentesis, HTN, anxiety who presented with myalgia, shortness of breath, abdominal distention, lower extremity edema.  Noncompliant with outpatient hemodialysis and outpatient paracentesis.   -Found to be severely volume overloaded on presentation and on acute respiratory failure with hypoxia and hypercarbia secondary to pulmonary edema. bun 66 cr 9,5  CXR w diffuse extensive interstitial infiltrates bilaterally (homogenous), with bilateral pleural effusions.  12/11 left knee aspiration and steroid injection.  Patient is from SNF.  Patient has persistent weakness in bilateral lower extremities, imagings did not support any fracture or dislocation. PT/OT consulted , recommendation is to go back to SNF.  12/14 volume status is improving with ultrafiltration, however weight remains largely unchanged 12/16 paracentesis 3,9 L removed 12/17 patient had HD  Subjective: Feels fair, wants to go home before Christmas, legs still extremely heavy, unable to walk  Assessment and Plan:  Acute hypoxic respiratory failure ESRD on dialysis (HCC) Volume overload. Hyponatremia, hyperkalemia  -Undergoing serial HD>aggressive ultrafiltration for volume removal.  -still vol overloaded -per Nephrology -Emphasized fluid restriction with patient and RN, weight finally starting to improve a bit, suspect he will need more frequent dialysis to catch up -Repeat paracentesis today with albumin  Anemia of chronic renal disease with iron deficiency anemia Continue with IV iron and EPO.   Acute metabolic encephalopathy has resolved.  Opiate dose was decreased Gabapentin discontinued, Flexeril decreased with improvement in his mentation.   Essential  hypertension Continue hydralazine/ isosorbide, carvedilol and entresto   Decompensated cirrhosis (HCC) 12/02 paracentesis 3 L removed.  12/16 paracentesis 3.9 L removed.  -gets outpatient paracentesis routinely every 3 to  4 weeks. -Will repeat paracentesis with albumin today  Acute on chronic diastolic CHF Echocardiogram 06/2023 (care everywhere) preserved LV systolic function with EF 55 to 60%, mild LVH, possible loculated inferior small size pericardial effusion, RV with normal size, and normal systolic function, LA and RA with normal size, no significant valvular disease.   -Continue ultrafiltration with hemodialysis and intermittent paracentesis -carvedilol, entresto, hydralazine and isosorbide.   Knee pain Bilateral knee pain and back pain, with ambulatory dysfunction. 12/11 left knee aspiration and injection.  No crystals seen, hemarthrosis  Positive ambulatory dysfunction.   Uncontrolled type 1 diabetes mellitus with hyperglycemia, with long-term current use of insulin (HCC) CBGs are stable now, continue basal insulin, SSI  DVT prophylaxis: Hep SQ Code Status: Full Code Family Communication: No family at bedside Disposition Plan:-Volume status has improved  Consultants:    Procedures:   Antimicrobials:    Objective: Vitals:   08/23/23 1947 08/24/23 0021 08/24/23 0500 08/24/23 0747  BP: 125/73 133/77  (!) 147/84  Pulse: 88 90  88  Resp: 18 18  20   Temp: 97.7 F (36.5 C) 97.7 F (36.5 C)  98.3 F (36.8 C)  TempSrc: Oral Oral  Oral  SpO2: 99% 92%  94%  Weight:   (!) 149.6 kg   Height:        Intake/Output Summary (Last 24 hours) at 08/24/2023 1159 Last data filed at 08/23/2023 1230 Gross per 24 hour  Intake --  Output 6000 ml  Net -6000 ml   Filed Weights   08/23/23 0804 08/23/23 1317 08/24/23 0500  Weight: (!) 156.7 kg (!) 150 kg (!) 149.6 kg    Examination:  Obese chronically  ill male sitting up in bed, AAOx3, cognitive deficits HEENT:  Positive JVD CVS: S1-S2, regular rhythm Lungs: Decreased breath sounds at the bases Abdomen: Obese, nontender, positive fluid thrill Extremities: 2-3+ brawny edema extending to abdomen    Data Reviewed:   CBC: Recent Labs  Lab 08/18/23 2207 08/23/23 0730  WBC 6.7 5.7  HGB 9.7* 10.3*  HCT 32.1* 33.5*  MCV 91.7 91.5  PLT 362 293   Basic Metabolic Panel: Recent Labs  Lab 08/18/23 2207 08/19/23 0313 08/20/23 0226 08/21/23 0238 08/22/23 0245 08/23/23 0227 08/24/23 0248  NA 132* 130* 136 137 137 139 138  K 5.3* 4.4 4.9 5.4* 4.9 4.9 4.8  CL 90* 92* 94* 92* 93* 92* 93*  CO2 25 26 30 31 30  33* 33*  GLUCOSE 187* 166* 151* 149* 226* 209* 174*  BUN 104* 80* 98* 113* 90* 105* 81*  CREATININE 6.63* 5.12* 6.28* 7.23* 5.97* 6.73* 5.53*  CALCIUM 9.6 9.4 9.4 9.7 9.3 9.3 9.6  PHOS 7.0* 5.8* 6.6* 7.8* 6.9*  --   --    GFR: Estimated Creatinine Clearance: 31.9 mL/min (A) (by C-G formula based on SCr of 5.53 mg/dL (H)). Liver Function Tests: Recent Labs  Lab 08/18/23 2207 08/19/23 0313 08/20/23 0226 08/21/23 0238 08/22/23 0245  ALBUMIN 2.5* 2.7* 2.5* 2.5* 2.4*   No results for input(s): "LIPASE", "AMYLASE" in the last 168 hours. No results for input(s): "AMMONIA" in the last 168 hours. Coagulation Profile: No results for input(s): "INR", "PROTIME" in the last 168 hours. Cardiac Enzymes: No results for input(s): "CKTOTAL", "CKMB", "CKMBINDEX", "TROPONINI" in the last 168 hours. BNP (last 3 results) No results for input(s): "PROBNP" in the last 8760 hours. HbA1C: No results for input(s): "HGBA1C" in the last 72 hours. CBG: Recent Labs  Lab 08/23/23 1352 08/23/23 1630 08/23/23 2052 08/24/23 0438 08/24/23 1134  GLUCAP 96 157* 115* 160* 154*   Lipid Profile: No results for input(s): "CHOL", "HDL", "LDLCALC", "TRIG", "CHOLHDL", "LDLDIRECT" in the last 72 hours. Thyroid Function Tests: No results for input(s): "TSH", "T4TOTAL", "FREET4", "T3FREE", "THYROIDAB" in the  last 72 hours. Anemia Panel: No results for input(s): "VITAMINB12", "FOLATE", "FERRITIN", "TIBC", "IRON", "RETICCTPCT" in the last 72 hours. Urine analysis:    Component Value Date/Time   COLORURINE YELLOW 03/01/2021 0847   APPEARANCEUR CLEAR 03/01/2021 0847   LABSPEC 1.010 03/01/2021 0847   PHURINE 7.0 03/01/2021 0847   GLUCOSEU 150 (A) 03/01/2021 0847   HGBUR SMALL (A) 03/01/2021 0847   BILIRUBINUR NEGATIVE 03/01/2021 0847   KETONESUR 5 (A) 03/01/2021 0847   PROTEINUR >=300 (A) 03/01/2021 0847   NITRITE NEGATIVE 03/01/2021 0847   LEUKOCYTESUR NEGATIVE 03/01/2021 0847   Sepsis Labs: @LABRCNTIP (procalcitonin:4,lacticidven:4)  )No results found for this or any previous visit (from the past 240 hours).   Radiology Studies: No results found.    Scheduled Meds:  carvedilol  25 mg Oral BID WC   Chlorhexidine Gluconate Cloth  6 each Topical Q0600   darbepoetin (ARANESP) injection - DIALYSIS  200 mcg Subcutaneous Q Mon-1800   feeding supplement (NEPRO CARB STEADY)  237 mL Oral BID BM   heparin  5,000 Units Subcutaneous Q8H   hydrALAZINE  100 mg Oral TID   insulin aspart  0-15 Units Subcutaneous TID WC   insulin aspart  0-5 Units Subcutaneous QHS   insulin aspart  3 Units Subcutaneous TID WC   insulin glargine-yfgn  5 Units Subcutaneous Daily   isosorbide mononitrate  90 mg Oral Daily   loratadine  10 mg Oral  Daily   polyethylene glycol  17 g Oral Daily   sacubitril-valsartan  1 tablet Oral BID   sevelamer carbonate  1,600 mg Oral TID WC   Continuous Infusions:  albumin human       LOS: 20 days    Time spent:    Zannie Cove, MD [facility]   08/24/2023, 11:59 AM

## 2023-08-24 NOTE — Plan of Care (Signed)
  Problem: Clinical Measurements: Goal: Ability to maintain clinical measurements within normal limits will improve Outcome: Progressing   Problem: Clinical Measurements: Goal: Will remain free from infection Outcome: Progressing   

## 2023-08-24 NOTE — Plan of Care (Signed)
  Problem: Education: Goal: Knowledge of General Education information will improve Description: Including pain rating scale, medication(s)/side effects and non-pharmacologic comfort measures Outcome: Progressing   Problem: Health Behavior/Discharge Planning: Goal: Ability to manage health-related needs will improve Outcome: Progressing   Problem: Clinical Measurements: Goal: Ability to maintain clinical measurements within normal limits will improve Outcome: Progressing Goal: Will remain free from infection Outcome: Progressing Goal: Diagnostic test results will improve Outcome: Progressing Goal: Respiratory complications will improve Outcome: Progressing Goal: Cardiovascular complication will be avoided Outcome: Progressing   Problem: Nutrition: Goal: Adequate nutrition will be maintained Outcome: Progressing   Problem: Pain Management: Goal: General experience of comfort will improve Outcome: Progressing   Problem: Skin Integrity: Goal: Risk for impaired skin integrity will decrease Outcome: Progressing   Problem: Education: Goal: Ability to describe self-care measures that may prevent or decrease complications (Diabetes Survival Skills Education) will improve Outcome: Progressing Goal: Individualized Educational Video(s) Outcome: Progressing   Problem: Coping: Goal: Ability to adjust to condition or change in health will improve Outcome: Progressing   Problem: Fluid Volume: Goal: Ability to maintain a balanced intake and output will improve Outcome: Progressing   Problem: Health Behavior/Discharge Planning: Goal: Ability to identify and utilize available resources and services will improve Outcome: Progressing Goal: Ability to manage health-related needs will improve Outcome: Progressing   Problem: Metabolic: Goal: Ability to maintain appropriate glucose levels will improve Outcome: Progressing   Problem: Nutritional: Goal: Progress toward achieving an  optimal weight will improve Outcome: Progressing   Problem: Tissue Perfusion: Goal: Adequacy of tissue perfusion will improve Outcome: Progressing

## 2023-08-24 NOTE — Progress Notes (Signed)
[facility] Progress Note   Subjective:   Seen in room. He is questioning his diet orders - wasn't allowed to order bacon this morning. We talked that here out we will not be giving him any food/drink while on HD, I lowered his fluid restrictions. He is sitting on edge of bed, but doesn't think he can stand. Tried Auryxia as binder - didn't like, will change to sevelamer.  Objective Vitals:   08/23/23 1947 08/24/23 0021 08/24/23 0500 08/24/23 0747  BP: 125/73 133/77  (!) 147/84  Pulse: 88 90  88  Resp: 18 18  20   Temp: 97.7 F (36.5 C) 97.7 F (36.5 C)  98.3 F (36.8 C)  TempSrc: Oral Oral  Oral  SpO2: 99% 92%  94%  Weight:   (!) 149.6 kg   Height:       Physical Exam General: Chronically ill appearing man, NAD. + facial edema Heart: RRR; no murmur Lungs: CTA anteriorly Abdomen: very distended, non-tender.  Extremities: 2+ tense BLE edema; distal legs with nearly black hyperpigmentation. Scattered nodules - c/w prurigo nodularis, no blisters or ulcerations Dialysis Access: AVF + t/b  Additional Objective Labs: Basic Metabolic Panel: Recent Labs  Lab 08/20/23 0226 08/21/23 0238 08/22/23 0245 08/23/23 0227 08/24/23 0248  NA 136 137 137 139 138  K 4.9 5.4* 4.9 4.9 4.8  CL 94* 92* 93* 92* 93*  CO2 30 31 30  33* 33*  GLUCOSE 151* 149* 226* 209* 174*  BUN 98* 113* 90* 105* 81*  CREATININE 6.28* 7.23* 5.97* 6.73* 5.53*  CALCIUM 9.4 9.7 9.3 9.3 9.6  PHOS 6.6* 7.8* 6.9*  --   --    Liver Function Tests: Recent Labs  Lab 08/20/23 0226 08/21/23 0238 08/22/23 0245  ALBUMIN 2.5* 2.5* 2.4*   CBC: Recent Labs  Lab 08/18/23 2207 08/23/23 0730  WBC 6.7 5.7  HGB 9.7* 10.3*  HCT 32.1* 33.5*  MCV 91.7 91.5  PLT 362 293   CBG: Recent Labs  Lab 08/23/23 1228 08/23/23 1352 08/23/23 1630 08/23/23 2052 08/24/23 0438  GLUCAP 108* 96 157* 115* 160*   Medications:  albumin human      carvedilol  25 mg Oral BID WC   Chlorhexidine Gluconate Cloth  6  each Topical Q0600   darbepoetin (ARANESP) injection - DIALYSIS  200 mcg Subcutaneous Q Mon-1800   feeding supplement (NEPRO CARB STEADY)  237 mL Oral BID BM   ferric citrate  420 mg Oral TID WC   heparin  5,000 Units Subcutaneous Q8H   hydrALAZINE  100 mg Oral TID   insulin aspart  0-15 Units Subcutaneous TID WC   insulin aspart  0-5 Units Subcutaneous QHS   insulin aspart  3 Units Subcutaneous TID WC   insulin glargine-yfgn  5 Units Subcutaneous Daily   isosorbide mononitrate  90 mg Oral Daily   loratadine  10 mg Oral Daily   polyethylene glycol  17 g Oral Daily   sacubitril-valsartan  1 tablet Oral BID   Dialysis Orders: TTS - HP 4:15hr, 500/A2, EDW 112kg, 2K/2Ca, AVF, heparin 10K bolus - Mircera q 2 weeks - Hectoral q HD - Venofer course was given prior to admit   Assessment/Plan: 1. Chronic/severe volume overload: Large volume HD, min improvement per scale. On exam - tiny bit of improvement, hard to tell. He has been unable to stand for weight. Has been drinking more that prior fluid restrictions, has been counseled. Lowered to 1.2L/d yesterday. Talked with HD staff - not  going to be giving him ice/drinks during HD. Abd [place] 12/17 - still with B pleural effusions and large ascites. 2. ESRD: Serial HD on admit, now just TTS. Next HD tomorrow - 6L UFG as tolerated. 3. HTN/volume:  BP ok, chronic edema. See above. 4. Anemia: Hgb 10.3 - continue Aranesp q Monday while here. 5. Secondary hyperparathyroidism:  CorrCa high, Phos high. VDRA on hold. 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 Dislikes Velphoro binder, disliked Auryxia -> try sevelamer as binder. 6. Nutrition:  Alb low, changed from "heart healthy" to renal diet. 7. T1DM: On insulin. 8. Ascites: Cirrhosis and nephrogenic ascites: S/p para 12/16, repeat para planned 12/20. 9. LE weakness/pain:  No fractures, result of #1 10. Dispo: Unfortunately weight exactly the same (or more) from admit. Plan is SNF on discharge.   Ozzie Hoyle, PA-C 08/24/2023, 11:26 AM  [employer]

## 2023-08-24 NOTE — Progress Notes (Signed)
PT Cancellation Note  Patient Details Name: Bobby [name] MRN: [id number] DOB: [birth year]   Cancelled Treatment:    Reason Eval/Treat Not Completed: Patient at procedure or test/unavailable (pt off unit at IR for paracenthesis. will follow up at later date/time as pt able and schedule allows.)   Renaldo Fiddler PT, DPT Acute Rehabilitation Services Office [phone]  08/24/23 12:27 PM

## 2023-08-24 NOTE — Progress Notes (Signed)
OT Cancellation Note  Patient Details Name: Bobby [name] MRN: [id number] DOB: December 05, 1995   Cancelled Treatment:    Reason Eval/Treat Not Completed: Patient at procedure or test/ unavailable (pt off unit at IR for paracenthesis. will follow up at later date/time as pt schedule allows )  Lorre Munroe 08/24/2023, 12:30 PM

## 2023-08-25 DIAGNOSIS — J9601 Acute respiratory failure with hypoxia: Secondary | ICD-10-CM | POA: Diagnosis not present

## 2023-08-25 DIAGNOSIS — J9602 Acute respiratory failure with hypercapnia: Secondary | ICD-10-CM | POA: Diagnosis not present

## 2023-08-25 LAB — BASIC METABOLIC PANEL WITH GFR
Anion gap: 18 — ABNORMAL HIGH (ref 5–15)
BUN: 103 mg/dL — ABNORMAL HIGH (ref 6–20)
CO2: 32 mmol/L (ref 22–32)
Calcium: 9.8 mg/dL (ref 8.9–10.3)
Chloride: 91 mmol/L — ABNORMAL LOW (ref 98–111)
Creatinine, Ser: 6.58 mg/dL — ABNORMAL HIGH (ref 0.61–1.24)
GFR, Estimated: 11 mL/min — ABNORMAL LOW
Glucose, Bld: 121 mg/dL — ABNORMAL HIGH (ref 70–99)
Potassium: 5 mmol/L (ref 3.5–5.1)
Sodium: 141 mmol/L (ref 135–145)

## 2023-08-25 LAB — GLUCOSE, CAPILLARY
Glucose-Capillary: 78 mg/dL (ref 70–99)
Glucose-Capillary: 101 mg/dL — ABNORMAL HIGH (ref 70–99)
Glucose-Capillary: 144 mg/dL — ABNORMAL HIGH (ref 70–99)

## 2023-08-25 LAB — CBC
HCT: 34.4 % — ABNORMAL LOW (ref 39.0–52.0)
Hemoglobin: 10.4 g/dL — ABNORMAL LOW (ref 13.0–17.0)
MCH: 27.8 pg (ref 26.0–34.0)
MCHC: 30.2 g/dL (ref 30.0–36.0)
MCV: 92 fL (ref 80.0–100.0)
Platelets: 289 10*3/uL (ref 150–400)
RBC: 3.74 MIL/uL — ABNORMAL LOW (ref 4.22–5.81)
RDW: 18.2 % — ABNORMAL HIGH (ref 11.5–15.5)
WBC: 5.8 10*3/uL (ref 4.0–10.5)
nRBC: 0 % (ref 0.0–0.2)

## 2023-08-25 MED ORDER — LIDOCAINE HCL (PF) 1 % IJ SOLN: 5.0000 mL | INTRAMUSCULAR | Status: DC | PRN

## 2023-08-25 MED ORDER — HEPARIN SODIUM (PORCINE) 1000 UNIT/ML DIALYSIS: 1000.0000 [IU] | INTRAMUSCULAR | Status: DC | PRN

## 2023-08-25 MED ORDER — PENTAFLUOROPROP-TETRAFLUOROETH EX AERO: 1.0000 | INHALATION_SPRAY | CUTANEOUS | Status: DC | PRN

## 2023-08-25 MED ORDER — LIDOCAINE-PRILOCAINE 2.5-2.5 % EX CREA: 1.0000 | TOPICAL_CREAM | CUTANEOUS | Status: DC | PRN

## 2023-08-25 MED ORDER — HEPARIN SODIUM (PORCINE) 1000 UNIT/ML DIALYSIS
10000.0000 [IU] | Freq: Once | INTRAMUSCULAR | Status: AC
  Administered 2023-08-25: 10000 [IU] via INTRAVENOUS_CENTRAL
  Filled 2023-08-25: qty 10

## 2023-08-25 NOTE — Plan of Care (Signed)
  Problem: Clinical Measurements: Goal: Ability to maintain clinical measurements within normal limits will improve Outcome: Progressing   Problem: Clinical Measurements: Goal: Will remain free from infection Outcome: Progressing   

## 2023-08-25 NOTE — Progress Notes (Signed)
PROGRESS NOTE    Bobby [name]  NWG:[id number] DOB: [birth year] DOA: 08/04/2023 PCP: [facility]  27/M w ESRD on dialysis on TTS schedule, morbid obesity, systolic CHF, cirrhosis, diabetes type 1, recurrent ascites requiring paracentesis, HTN, anxiety who presented with myalgia, shortness of breath, abdominal distention, lower extremity edema.  Noncompliant with outpatient hemodialysis and outpatient paracentesis.   -Found to be severely volume overloaded on presentation and on acute respiratory failure with hypoxia and hypercarbia secondary to pulmonary edema. bun 66 cr 9,5  CXR w diffuse extensive interstitial infiltrates bilaterally (homogenous), with bilateral pleural effusions.  12/11 left knee aspiration and steroid injection.  Patient is from SNF.  Patient has persistent weakness in bilateral lower extremities, imagings did not support any fracture or dislocation. PT/OT consulted , recommendation is to go back to SNF.  12/14 volume status is improving with ultrafiltration, however weight remains largely unchanged 12/16 paracentesis 3,9 L removed 12/20: Repeat paracentesis, 5 L drained  Subjective: Seen on dialysis  Assessment and Plan:  Acute hypoxic respiratory failure ESRD on dialysis (HCC) Volume overload. Hyponatremia, hyperkalemia  -Undergoing serial HD>aggressive ultrafiltration for volume removal.  -still significantly vol overloaded -per Nephrology, may need daily dialysis for a week -Emphasized fluid restriction with patient and RN, weight finally starting to improve a bit, suspect he will need more frequent dialysis to catch up -Repeated paracentesis yesterday  Anemia of chronic renal disease with iron deficiency anemia Continue with IV iron and EPO.   Acute metabolic encephalopathy has resolved.  Opiate dose was decreased Gabapentin discontinued, Flexeril decreased with improvement in his mentation.   Essential hypertension Continue  hydralazine/ isosorbide, carvedilol and entresto   Decompensated cirrhosis (HCC) 12/02 paracentesis 3 L removed.  12/16 paracentesis 3.9 L removed.  -gets outpatient paracentesis routinely every 3 to  4 weeks. -Repeat paracentesis 12/20, 5 L drained  Acute on chronic diastolic CHF Echocardiogram 06/2023 (care everywhere) preserved LV systolic function with EF 55 to 60%, mild LVH, possible loculated inferior small size pericardial effusion, RV with normal size, and normal systolic function, LA and RA with normal size, no significant valvular disease.   -Continue ultrafiltration with hemodialysis and intermittent paracentesis -carvedilol, entresto, hydralazine and isosorbide.   Knee pain Bilateral knee pain and back pain, with ambulatory dysfunction. 12/11 left knee aspiration and injection.  No crystals seen, hemarthrosis  Positive ambulatory dysfunction.   Uncontrolled type 1 diabetes mellitus with hyperglycemia, with long-term current use of insulin (HCC) CBGs are stable now, continue basal insulin, SSI  DVT prophylaxis: Hep SQ Code Status: Full Code Family Communication: No family at bedside Disposition Plan:-Home when volume status has improved  Consultants:    Procedures:   Antimicrobials:    Objective: Vitals:   08/25/23 1100 08/25/23 1130 08/25/23 1200 08/25/23 1230  BP: (!) 153/84 138/78 130/73 132/73  Pulse: 87 85 90 83  Resp: (!) 21 15 (!) 25 17  Temp:      TempSrc:      SpO2: 98% 98% 100% 98%  Weight:      Height:        Intake/Output Summary (Last 24 hours) at 08/25/2023 1253 Last data filed at 08/24/2023 1500 Gross per 24 hour  Intake 474 ml  Output --  Net 474 ml   Filed Weights   08/23/23 1317 08/24/23 0500 08/25/23 0940  Weight: (!) 150 kg (!) 149.6 kg (!) 149.6 kg    Examination:  Obese chronically ill male sitting up in bed, seen on dialysis, AAOx3 HEENT:  Positive JVD CVS: S1-S2, regular rhythm Lungs: Decreased breath sounds at the  bases Abdomen: Obese, nontender, positive fluid thrill Extremities: 2+ brawny edema    Data Reviewed:   CBC: Recent Labs  Lab 08/18/23 2207 08/23/23 0730 08/25/23 0823  WBC 6.7 5.7 5.8  HGB 9.7* 10.3* 10.4*  HCT 32.1* 33.5* 34.4*  MCV 91.7 91.5 92.0  PLT 362 293 289   Basic Metabolic Panel: Recent Labs  Lab 08/18/23 2207 08/19/23 0313 08/20/23 0226 08/21/23 0238 08/22/23 0245 08/23/23 0227 08/24/23 0248 08/25/23 0227  NA 132* 130* 136 137 137 139 138 141  K 5.3* 4.4 4.9 5.4* 4.9 4.9 4.8 5.0  CL 90* 92* 94* 92* 93* 92* 93* 91*  CO2 25 26 30 31 30  33* 33* 32  GLUCOSE 187* 166* 151* 149* 226* 209* 174* 121*  BUN 104* 80* 98* 113* 90* 105* 81* 103*  CREATININE 6.63* 5.12* 6.28* 7.23* 5.97* 6.73* 5.53* 6.58*  CALCIUM 9.6 9.4 9.4 9.7 9.3 9.3 9.6 9.8  PHOS 7.0* 5.8* 6.6* 7.8* 6.9*  --   --   --    GFR: Estimated Creatinine Clearance: 26.8 mL/min (A) (by C-G formula based on SCr of 6.58 mg/dL (H)). Liver Function Tests: Recent Labs  Lab 08/18/23 2207 08/19/23 0313 08/20/23 0226 08/21/23 0238 08/22/23 0245  ALBUMIN 2.5* 2.7* 2.5* 2.5* 2.4*   No results for input(s): "LIPASE", "AMYLASE" in the last 168 hours. No results for input(s): "AMMONIA" in the last 168 hours. Coagulation Profile: No results for input(s): "INR", "PROTIME" in the last 168 hours. Cardiac Enzymes: No results for input(s): "CKTOTAL", "CKMB", "CKMBINDEX", "TROPONINI" in the last 168 hours. BNP (last 3 results) No results for input(s): "PROBNP" in the last 8760 hours. HbA1C: No results for input(s): "HGBA1C" in the last 72 hours. CBG: Recent Labs  Lab 08/23/23 2052 08/24/23 0438 08/24/23 1134 08/24/23 1633 08/24/23 2113  GLUCAP 115* 160* 154* 130* 129*   Lipid Profile: No results for input(s): "CHOL", "HDL", "LDLCALC", "TRIG", "CHOLHDL", "LDLDIRECT" in the last 72 hours. Thyroid Function Tests: No results for input(s): "TSH", "T4TOTAL", "FREET4", "T3FREE", "THYROIDAB" in the last 72  hours. Anemia Panel: No results for input(s): "VITAMINB12", "FOLATE", "FERRITIN", "TIBC", "IRON", "RETICCTPCT" in the last 72 hours. Urine analysis:    Component Value Date/Time   COLORURINE YELLOW 03/01/2021 0847   APPEARANCEUR CLEAR 03/01/2021 0847   LABSPEC 1.010 03/01/2021 0847   PHURINE 7.0 03/01/2021 0847   GLUCOSEU 150 (A) 03/01/2021 0847   HGBUR SMALL (A) 03/01/2021 0847   BILIRUBINUR NEGATIVE 03/01/2021 0847   KETONESUR 5 (A) 03/01/2021 0847   PROTEINUR >=300 (A) 03/01/2021 0847   NITRITE NEGATIVE 03/01/2021 0847   LEUKOCYTESUR NEGATIVE 03/01/2021 0847   Sepsis Labs: @LABRCNTIP (procalcitonin:4,lacticidven:4)  )No results found for this or any previous visit (from the past 240 hours).   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 Request for therapeutic paracentesis. EXAM: ULTRASOUND GUIDED PARACENTESIS MEDICATIONS: 1% lidocaine 10 mL COMPLICATIONS: None immediate. PROCEDURE: Informed written consent was obtained from the patient after a discussion of the risks, benefits and alternatives to treatment. A timeout was performed prior to the initiation of the procedure. Initial ultrasound scanning demonstrates a large amount of ascites within the right lower abdominal quadrant. The right lower abdomen was prepped and draped in the usual sterile fashion. 1% lidocaine was used for local anesthesia. Following this, a 19 gauge, 7-cm, Yueh catheter was introduced. An ultrasound image was saved for documentation purposes. The  paracentesis was performed. The catheter was removed and a dressing was applied. The patient tolerated the procedure well without immediate post procedural complication. FINDINGS: A total of approximately 5 L of clear yellow fluid was removed. IMPRESSION: Successful ultrasound-guided paracentesis yielding 5 liters of peritoneal fluid. Procedure performed by: Corrin Parker, PA-C  Electronically Signed   By: Malachy Moan M.D.   On: 08/24/2023 15:03      Scheduled Meds:  carvedilol  25 mg Oral BID WC   Chlorhexidine Gluconate Cloth  6 each Topical Q0600   darbepoetin (ARANESP) injection - DIALYSIS  200 mcg Subcutaneous Q Mon-1800   feeding supplement (NEPRO CARB STEADY)  237 mL Oral BID BM   heparin  5,000 Units Subcutaneous Q8H   hydrALAZINE  100 mg Oral TID   insulin aspart  0-15 Units Subcutaneous TID WC   insulin aspart  0-5 Units Subcutaneous QHS   insulin aspart  3 Units Subcutaneous TID WC   insulin glargine-yfgn  5 Units Subcutaneous Daily   isosorbide mononitrate  90 mg Oral Daily   loratadine  10 mg Oral Daily   polyethylene glycol  17 g Oral Daily   sacubitril-valsartan  1 tablet Oral BID   sevelamer carbonate  1,600 mg Oral TID WC   Continuous Infusions:     LOS: 21 days    Time spent:    Zannie Cove, MD [facility]   08/25/2023, 12:53 PM

## 2023-08-25 NOTE — Progress Notes (Signed)
Patient refusing midnight vitals.

## 2023-08-25 NOTE — Progress Notes (Signed)
[facility] Progress Note   Subjective:   Patient seen and examined at bedside in HD.  Tolerating treatment well.  He believes edema is improving and blames LE edema in legs on the bed.  States he is following fluid restrictions. Denies CP, SOB, abdominal pain and n/v/d. Paracentesis yesterday with 5L fluid removed.   Objective Vitals:   08/25/23 0940 08/25/23 1010 08/25/23 1030 08/25/23 1100  BP: (!) 158/78 (!) 146/76 (!) 143/84 (!) 153/84  Pulse: 83 82 85 87  Resp: 14 19 18  (!) 21  Temp: 98.4 F (36.9 C)     TempSrc:      SpO2: 96% 100% 98% 98%  Weight: (!) 149.6 kg     Height:       Physical Exam General:chronically ill appearing male in NAD Heart:RRR, no mrg Lungs:CTAB, nml WOB on RA Abdomen:soft, NTND 1+ flank edema Extremities: 2+ tense LE edema from hips to feet Dialysis Access: AVF in use   Filed Weights   08/23/23 1317 08/24/23 0500 08/25/23 0940  Weight: (!) 150 kg (!) 149.6 kg (!) 149.6 kg    Intake/Output Summary (Last 24 hours) at 08/25/2023 1132 Last data filed at 08/24/2023 1500 Gross per 24 hour  Intake 474 ml  Output --  Net 474 ml    Additional Objective Labs: Basic Metabolic Panel: Recent Labs  Lab 08/20/23 0226 08/21/23 0238 08/22/23 0245 08/23/23 0227 08/24/23 0248 08/25/23 0227  NA 136 137 137 139 138 141  K 4.9 5.4* 4.9 4.9 4.8 5.0  CL 94* 92* 93* 92* 93* 91*  CO2 30 31 30  33* 33* 32  GLUCOSE 151* 149* 226* 209* 174* 121*  BUN 98* 113* 90* 105* 81* 103*  CREATININE 6.28* 7.23* 5.97* 6.73* 5.53* 6.58*  CALCIUM 9.4 9.7 9.3 9.3 9.6 9.8  PHOS 6.6* 7.8* 6.9*  --   --   --    Liver Function Tests: Recent Labs  Lab 08/20/23 0226 08/21/23 0238 08/22/23 0245  ALBUMIN 2.5* 2.5* 2.4*   CBC: Recent Labs  Lab 08/18/23 2207 08/23/23 0730 08/25/23 0823  WBC 6.7 5.7 5.8  HGB 9.7* 10.3* 10.4*  HCT 32.1* 33.5* 34.4*  MCV 91.7 91.5 92.0  PLT 362 293 289   Blood Culture    Component Value Date/Time   SDES BLOOD  BLOOD RIGHT ARM 08/05/2023 0236   SPECREQUEST  08/05/2023 0236    BOTTLES DRAWN AEROBIC AND ANAEROBIC Blood Culture adequate volume   CULT  08/05/2023 0236    NO GROWTH 5 DAYS Performed at [facility], [address] [place], [place] [address]    REPTSTATUS 08/10/2023 FINAL 08/05/2023 0236    CBG: Recent Labs  Lab 08/23/23 2052 08/24/23 0438 08/24/23 1134 08/24/23 1633 08/24/23 2113  GLUCAP 115* 160* 154* 130* 129*    Studies/Results: IR Paracentesis Result Date: 08/24/2023 INDICATION: End-stage renal disease on hemodialysis with recurrent ascites. Request for therapeutic paracentesis. EXAM: ULTRASOUND GUIDED PARACENTESIS MEDICATIONS: 1% lidocaine 10 mL COMPLICATIONS: None immediate. PROCEDURE: Informed written consent was obtained from the patient after a discussion of the risks, benefits and alternatives to treatment. A timeout was performed prior to the initiation of the procedure. Initial ultrasound scanning demonstrates a large amount of ascites within the right lower abdominal quadrant. The right lower abdomen was prepped and draped in the usual sterile fashion. 1% lidocaine was used for local anesthesia. Following this, a 19 gauge, 7-cm, Yueh catheter was introduced. An ultrasound image was saved for documentation purposes. The paracentesis was  performed. The catheter was removed and a dressing was applied. The patient tolerated the procedure well without immediate post procedural complication. FINDINGS: A total of approximately 5 L of clear yellow fluid was removed. IMPRESSION: Successful ultrasound-guided paracentesis yielding 5 liters of peritoneal fluid. Procedure performed by: Corrin Parker, PA-C Electronically Signed   By: Malachy Moan M.D.   On: 08/24/2023 15:03    Medications:   carvedilol  25 mg Oral BID WC   Chlorhexidine Gluconate Cloth  6 each Topical Q0600   darbepoetin (ARANESP) injection - DIALYSIS  200 mcg Subcutaneous Q Mon-1800   feeding supplement  (NEPRO CARB STEADY)  237 mL Oral BID BM   heparin  5,000 Units Subcutaneous Q8H   hydrALAZINE  100 mg Oral TID   insulin aspart  0-15 Units Subcutaneous TID WC   insulin aspart  0-5 Units Subcutaneous QHS   insulin aspart  3 Units Subcutaneous TID WC   insulin glargine-yfgn  5 Units Subcutaneous Daily   isosorbide mononitrate  90 mg Oral Daily   loratadine  10 mg Oral Daily   polyethylene glycol  17 g Oral Daily   sacubitril-valsartan  1 tablet Oral BID   sevelamer carbonate  1,600 mg Oral TID WC    Dialysis Orders: TTS - HP 4:15hr, 500/A2, EDW 112kg, 2K/2Ca, AVF, heparin 10K bolus - Mircera q 2 weeks - Hectoral q HD - Venofer course was given prior to admit   Assessment/Plan: 1. Chronic/severe volume overload: Large volume HD, min improvement per scale. On exam - tiny bit of improvement, hard to tell. He has been unable to stand for weight. Has been drinking more prior fluid restrictions, lowered to 1.2L/d 12/20.  Continue to counsel about fluid restrictions. Abd [place] 12/17 - still with B pleural effusions and large ascites. 2. ESRD: Serial HD on admit, now just TTS. HD today- 6L UFG as tolerated.  Holiday Schedule this week, Monday, Thursday, Saturday.  Next HD on 12/23. 3. HTN/volume:  BP ok, chronic edema. See above. 4. Anemia: Hgb 10.4 - continue Aranesp q Monday while here. 5. Secondary hyperparathyroidism:  CorrCa high, Phos high. VDRA on hold. 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 Dislikes Velphoro and Gean Quint -> try sevelamer as binder. 6. Nutrition:  Alb low, changed from "heart healthy" to renal diet w/1213mL fluid restrictions. 7. T1DM: On insulin. 8. Ascites: Cirrhosis and nephrogenic ascites: S/p para 12/16 and 12/20. 9. LE weakness/pain: No fractures, result of #1 10. Dispo: Unfortunately weight exactly the same (or more) from admit.  Plan is SNF on discharge.  Virgina Norfolk, PA-C [facility] 08/25/2023,11:32 AM  LOS: 21 days

## 2023-08-25 NOTE — Progress Notes (Signed)
Patient requested to be switched to afternoon run for dialysis today. I called dialysis @ 0300 this morning and told HD of the patient's request and the dialysis person said that would be ok. Dialysis called this am around 0645 to get report for the patient. I inquired about the schedule change for today and was informed that patient could not be switched and had to go on first run. I informed dialysis nurse that patient refused morning vitals and his morning FSBS. Dialysis nurse said he can't refuse vitals and FSBS, but patient is alert and orientedx4 and was educated about importance and necessity of these items and he still refused. Dialysis nurse said they would be sending for patient in the next 30 minutes. I informed the patient that he would still have to go on first run and he said he is refusing to go first run today. He said he would go on 2nd run. 3E Charge nurse informed and oncoming day shift RN was informed.

## 2023-08-25 NOTE — Progress Notes (Signed)
   08/25/23 1331  Vitals  Temp 98.1 F (36.7 C)  Pulse Rate 84  Resp (!) 25  BP (!) 109/98  SpO2 98 %  O2 Device Nasal Cannula  Weight  (specialty bed--unable to weigh)  Oxygen Therapy  O2 Flow Rate (L/min) 4 L/min  Patient Activity (if Appropriate) In bed  Pulse Oximetry Type Continuous  Oximetry Probe Site Changed No  Post Treatment  Dialyzer Clearance Lightly streaked  Hemodialysis Intake (mL) 0 mL  Liters Processed 76.7  Fluid Removed (mL) 5300 mL  Tolerated HD Treatment Yes  AVG/AVF Arterial Site Held (minutes) 10 minutes  AVG/AVF Venous Site Held (minutes) 12 minutes   Received patient in bed to unit.  Alert and oriented.  Informed consent signed and in chart.   TX duration:3hours 12 minutes--pt signed off AMA secondary to cramping in hands  Patient tolerated well.  Transported back to the room  Alert, without acute distress.  Hand-off given to patient's nurse.   Access used: LUAF Access issues: no complications  Total UF removed: 5300 Medication(s) given: none   [facility]

## 2023-08-25 NOTE — Progress Notes (Signed)
Pt refused cardiac monitoring and heparin shot today.

## 2023-08-25 NOTE — Progress Notes (Signed)
PT Cancellation Note  Patient Details Name: Bobby [name] MRN: [id number] DOB: [birth year]   Cancelled Treatment:    Reason Eval/Treat Not Completed: Patient at procedure or test/unavailable (pt off unit at HD. will follow up at later date/time as pt able and schedule allows.)    Renaldo Fiddler PT, DPT Acute Rehabilitation Services Office [phone]  08/25/23 12:29 PM

## 2023-08-26 DIAGNOSIS — J9601 Acute respiratory failure with hypoxia: Secondary | ICD-10-CM | POA: Diagnosis not present

## 2023-08-26 DIAGNOSIS — J9602 Acute respiratory failure with hypercapnia: Secondary | ICD-10-CM | POA: Diagnosis not present

## 2023-08-26 LAB — BASIC METABOLIC PANEL
Anion gap: 14 (ref 5–15)
BUN: 84 mg/dL — ABNORMAL HIGH (ref 6–20)
CO2: 32 mmol/L (ref 22–32)
Calcium: 9.4 mg/dL (ref 8.9–10.3)
Chloride: 93 mmol/L — ABNORMAL LOW (ref 98–111)
Creatinine, Ser: 6.19 mg/dL — ABNORMAL HIGH (ref 0.61–1.24)
GFR, Estimated: 12 mL/min — ABNORMAL LOW (ref >60)
Glucose, Bld: 150 mg/dL — ABNORMAL HIGH (ref 70–99)
Potassium: 4.8 mmol/L (ref 3.5–5.1)
Sodium: 139 mmol/L (ref 135–145)

## 2023-08-26 LAB — GLUCOSE, CAPILLARY
Glucose-Capillary: 124 mg/dL — ABNORMAL HIGH (ref 70–99)
Glucose-Capillary: 142 mg/dL — ABNORMAL HIGH (ref 70–99)

## 2023-08-26 MED ORDER — CHLORHEXIDINE GLUCONATE CLOTH 2 % EX PADS: 6.0000 | MEDICATED_PAD | Freq: Every day | CUTANEOUS | Status: DC

## 2023-08-26 MED ORDER — CHLORHEXIDINE GLUCONATE CLOTH 2 % EX PADS
6.0000 | MEDICATED_PAD | Freq: Every day | CUTANEOUS | Status: DC
  Administered 2023-08-27: 6 via TOPICAL

## 2023-08-26 MED ORDER — HEPARIN SODIUM (PORCINE) 1000 UNIT/ML IJ SOLN: 10000.0000 [IU] | Freq: Once | INTRAMUSCULAR | Status: AC

## 2023-08-26 MED ORDER — HEPARIN SODIUM (PORCINE) 1000 UNIT/ML IJ SOLN
INTRAMUSCULAR | Status: AC
  Administered 2023-08-26: 10000 [IU] via INTRAVENOUS
  Filled 2023-08-26: qty 10

## 2023-08-26 NOTE — Progress Notes (Signed)
PROGRESS NOTE    Bobby [name]  BJY:[id number] DOB: [birth year] DOA: 08/04/2023 PCP: [facility]  27/M w ESRD on dialysis on TTS schedule, morbid obesity, systolic CHF, cirrhosis, diabetes type 1, recurrent ascites requiring paracentesis, HTN, anxiety who presented with myalgia, shortness of breath, abdominal distention, lower extremity edema.  Noncompliant with outpatient hemodialysis and outpatient paracentesis.   -Found to be severely volume overloaded on presentation and on acute respiratory failure with hypoxia and hypercarbia secondary to pulmonary edema. bun 66 cr 9,5  CXR w diffuse extensive interstitial infiltrates bilaterally (homogenous), with bilateral pleural effusions.  12/11 left knee aspiration and steroid injection.  Patient is from SNF.  Patient has persistent weakness in bilateral lower extremities, imagings did not support any fracture or dislocation. PT/OT consulted , recommendation is to go back to SNF.  12/14 volume status is improving with ultrafiltration, however weight remains largely unchanged 12/16 paracentesis 3,9 L removed 12/20: Repeat paracentesis, 5 L drained  Subjective: Resting in bed, barely able to move  Assessment and Plan:  Acute hypoxic respiratory failure ESRD on dialysis (HCC) Volume overload. Hyponatremia, hyperkalemia  -Undergoing serial HD>aggressive ultrafiltration for volume removal.  -still significantly vol overloaded -per Nephrology, may need daily dialysis for a week -Emphasized fluid restriction with patient and RN, weight finally starting to improve a bit, suspect he will need more frequent dialysis to catch up -Repeated paracentesis 12/20 -Discussed with nephrology, extra HD today  Anemia of chronic renal disease with iron deficiency anemia Continue with IV iron and EPO.   Acute metabolic encephalopathy has resolved.  Opiate dose was decreased Gabapentin discontinued, Flexeril decreased with  improvement in his mentation.   Essential hypertension Continue hydralazine/ isosorbide, carvedilol and entresto   Decompensated cirrhosis (HCC) 12/02 paracentesis 3 L removed.  12/16 paracentesis 3.9 L removed.  -gets outpatient paracentesis routinely every 3 to  4 weeks. -Repeat paracentesis 12/20, 5 L drained  Acute on chronic diastolic CHF Echocardiogram 06/2023 (care everywhere) preserved LV systolic function with EF 55 to 60%, mild LVH, possible loculated inferior small size pericardial effusion, RV with normal size, and normal systolic function, LA and RA with normal size, no significant valvular disease.   -Continue ultrafiltration with hemodialysis and intermittent paracentesis -carvedilol, entresto, hydralazine and isosorbide.   Knee pain Bilateral knee pain and back pain, with ambulatory dysfunction. 12/11 left knee aspiration and injection.  No crystals seen, hemarthrosis  Positive ambulatory dysfunction.   Uncontrolled type 1 diabetes mellitus with hyperglycemia, with long-term current use of insulin (HCC) CBGs are stable now, continue basal insulin, SSI  DVT prophylaxis: Hep SQ Code Status: Full Code Family Communication: No family at bedside Disposition Plan:-Home when volume status has improved  Consultants:    Procedures:   Antimicrobials:    Objective: Vitals:   08/25/23 1326 08/25/23 1331 08/25/23 1544 08/25/23 2000  BP: 129/76 (!) 109/98 (!) 151/74   Pulse: 85 84 83   Resp: 20 (!) 25  19  Temp: 98.1 F (36.7 C) 98.1 F (36.7 C) (!) 97.4 F (36.3 C)   TempSrc: Oral  Oral Oral  SpO2: 98% 98% 98% 97%  Weight:      Height:        Intake/Output Summary (Last 24 hours) at 08/26/2023 1047 Last data filed at 08/26/2023 0914 Gross per 24 hour  Intake 660 ml  Output 5300 ml  Net -4640 ml   Filed Weights   08/23/23 1317 08/24/23 0500 08/25/23 0940  Weight: (!) 150 kg (!) 149.6 kg Marland Kitchen)  149.6 kg    Examination:  Obese chronically ill male  sitting up in bed, AAOx3 HEENT: Positive JVD CVS: S1-S2, regular rhythm Lungs: Decreased breath sounds at the bases  Abdomen: Obese, nontender, positive fluid thrill Extremities: 2+ brawny edema    Data Reviewed:   CBC: Recent Labs  Lab 08/23/23 0730 08/25/23 0823  WBC 5.7 5.8  HGB 10.3* 10.4*  HCT 33.5* 34.4*  MCV 91.5 92.0  PLT 293 289   Basic Metabolic Panel: Recent Labs  Lab 08/20/23 0226 08/21/23 0238 08/22/23 0245 08/23/23 0227 08/24/23 0248 08/25/23 0227 08/26/23 0258  NA 136 137 137 139 138 141 139  K 4.9 5.4* 4.9 4.9 4.8 5.0 4.8  CL 94* 92* 93* 92* 93* 91* 93*  CO2 30 31 30  33* 33* 32 32  GLUCOSE 151* 149* 226* 209* 174* 121* 150*  BUN 98* 113* 90* 105* 81* 103* 84*  CREATININE 6.28* 7.23* 5.97* 6.73* 5.53* 6.58* 6.19*  CALCIUM 9.4 9.7 9.3 9.3 9.6 9.8 9.4  PHOS 6.6* 7.8* 6.9*  --   --   --   --    GFR: Estimated Creatinine Clearance: 28.5 mL/min (A) (by C-G formula based on SCr of 6.19 mg/dL (H)). Liver Function Tests: Recent Labs  Lab 08/20/23 0226 08/21/23 0238 08/22/23 0245  ALBUMIN 2.5* 2.5* 2.4*   No results for input(s): "LIPASE", "AMYLASE" in the last 168 hours. No results for input(s): "AMMONIA" in the last 168 hours. Coagulation Profile: No results for input(s): "INR", "PROTIME" in the last 168 hours. Cardiac Enzymes: No results for input(s): "CKTOTAL", "CKMB", "CKMBINDEX", "TROPONINI" in the last 168 hours. BNP (last 3 results) No results for input(s): "PROBNP" in the last 8760 hours. HbA1C: No results for input(s): "HGBA1C" in the last 72 hours. CBG: Recent Labs  Lab 08/24/23 2113 08/25/23 1526 08/25/23 1615 08/25/23 2204 08/26/23 1006  GLUCAP 129* 78 101* 144* 124*   Lipid Profile: No results for input(s): "CHOL", "HDL", "LDLCALC", "TRIG", "CHOLHDL", "LDLDIRECT" in the last 72 hours. Thyroid Function Tests: No results for input(s): "TSH", "T4TOTAL", "FREET4", "T3FREE", "THYROIDAB" in the last 72 hours. Anemia Panel: No  results for input(s): "VITAMINB12", "FOLATE", "FERRITIN", "TIBC", "IRON", "RETICCTPCT" in the last 72 hours. Urine analysis:    Component Value Date/Time   COLORURINE YELLOW 03/01/2021 0847   APPEARANCEUR CLEAR 03/01/2021 0847   LABSPEC 1.010 03/01/2021 0847   PHURINE 7.0 03/01/2021 0847   GLUCOSEU 150 (A) 03/01/2021 0847   HGBUR SMALL (A) 03/01/2021 0847   BILIRUBINUR NEGATIVE 03/01/2021 0847   KETONESUR 5 (A) 03/01/2021 0847   PROTEINUR >=300 (A) 03/01/2021 0847   NITRITE NEGATIVE 03/01/2021 0847   LEUKOCYTESUR NEGATIVE 03/01/2021 0847   Sepsis Labs: @LABRCNTIP (procalcitonin:4,lacticidven:4)  )No results found for this or any previous visit (from the past 240 hours).   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 Request for therapeutic paracentesis. EXAM: ULTRASOUND GUIDED PARACENTESIS MEDICATIONS: 1% lidocaine 10 mL COMPLICATIONS: None immediate. PROCEDURE: Informed written consent was obtained from the patient after a discussion of the risks, benefits and alternatives to treatment. A timeout was performed prior to the initiation of the procedure. Initial ultrasound scanning demonstrates a large amount of ascites within the right lower abdominal quadrant. The right lower abdomen was prepped and draped in the usual sterile fashion. 1% lidocaine was used for local anesthesia. Following this, a 19 gauge, 7-cm, Yueh catheter was introduced. An ultrasound image was saved for documentation purposes. The paracentesis was performed. The  catheter was removed and a dressing was applied. The patient tolerated the procedure well without immediate post procedural complication. FINDINGS: A total of approximately 5 L of clear yellow fluid was removed. IMPRESSION: Successful ultrasound-guided paracentesis yielding 5 liters of peritoneal fluid. Procedure performed by: Corrin Parker, PA-C Electronically Signed   By:  Malachy Moan M.D.   On: 08/24/2023 15:03      Scheduled Meds:  carvedilol  25 mg Oral BID WC   Chlorhexidine Gluconate Cloth  6 each Topical Q0600   Chlorhexidine Gluconate Cloth  6 each Topical Q0600   darbepoetin (ARANESP) injection - DIALYSIS  200 mcg Subcutaneous Q Mon-1800   feeding supplement (NEPRO CARB STEADY)  237 mL Oral BID BM   heparin  5,000 Units Subcutaneous Q8H   hydrALAZINE  100 mg Oral TID   insulin aspart  0-15 Units Subcutaneous TID WC   insulin aspart  0-5 Units Subcutaneous QHS   insulin aspart  3 Units Subcutaneous TID WC   insulin glargine-yfgn  5 Units Subcutaneous Daily   isosorbide mononitrate  90 mg Oral Daily   loratadine  10 mg Oral Daily   polyethylene glycol  17 g Oral Daily   sacubitril-valsartan  1 tablet Oral BID   sevelamer carbonate  1,600 mg Oral TID WC   Continuous Infusions:     LOS: 22 days    Time spent:    Zannie Cove, MD [facility]   08/26/2023, 10:47 AM

## 2023-08-26 NOTE — Plan of Care (Signed)
  Problem: Education: Goal: Knowledge of General Education information will improve Description: Including pain rating scale, medication(s)/side effects and non-pharmacologic comfort measures Outcome: Progressing   Problem: Health Behavior/Discharge Planning: Goal: Ability to manage health-related needs will improve Outcome: Progressing   Problem: Clinical Measurements: Goal: Ability to maintain clinical measurements within normal limits will improve Outcome: Progressing Goal: Will remain free from infection Outcome: Progressing Goal: Diagnostic test results will improve Outcome: Progressing Goal: Respiratory complications will improve Outcome: Progressing Goal: Cardiovascular complication will be avoided Outcome: Progressing   Problem: Nutrition: Goal: Adequate nutrition will be maintained Outcome: Progressing   Problem: Pain Management: Goal: General experience of comfort will improve Outcome: Progressing   Problem: Skin Integrity: Goal: Risk for impaired skin integrity will decrease Outcome: Progressing   Problem: Education: Goal: Ability to describe self-care measures that may prevent or decrease complications (Diabetes Survival Skills Education) will improve Outcome: Progressing Goal: Individualized Educational Video(s) Outcome: Progressing   Problem: Coping: Goal: Ability to adjust to condition or change in health will improve Outcome: Progressing   Problem: Fluid Volume: Goal: Ability to maintain a balanced intake and output will improve Outcome: Progressing   Problem: Health Behavior/Discharge Planning: Goal: Ability to identify and utilize available resources and services will improve Outcome: Progressing Goal: Ability to manage health-related needs will improve Outcome: Progressing   Problem: Metabolic: Goal: Ability to maintain appropriate glucose levels will improve Outcome: Progressing   Problem: Nutritional: Goal: Progress toward achieving an  optimal weight will improve Outcome: Progressing   Problem: Tissue Perfusion: Goal: Adequacy of tissue perfusion will improve Outcome: Progressing

## 2023-08-26 NOTE — Progress Notes (Signed)
[facility] Progress Note   Subjective:   Patient seen and examined at bedside. Reports cramping at end of HD yesterday and signed off early.  Discussed compliance and importance of completing treatments and following fluid restrictions to improve functional status so he will be able to go home.  Educated again about different sources of fluid in diet.  Agreed to extra HD today.  Denies CP, SOB, abdominal pain and n/v/d.  Motivated to improve because he wants to be home for Christmas with his son who is in town from [place].   Objective Vitals:   08/25/23 1326 08/25/23 1331 08/25/23 1544 08/25/23 2000  BP: 129/76 (!) 109/98 (!) 151/74   Pulse: 85 84 83   Resp: 20 (!) 25  19  Temp: 98.1 F (36.7 C) 98.1 F (36.7 C) (!) 97.4 F (36.3 C)   TempSrc: Oral  Oral Oral  SpO2: 98% 98% 98% 97%  Weight:      Height:       Physical Exam General:chronically ill appearing male in NAD Heart:RRR, no mrg Lungs:CTAB, nml WOB on 4L Abdomen:soft, NT, +distended Extremities:2+ LE edema from hips to feet Dialysis Access: LU AVF   Filed Weights   08/23/23 1317 08/24/23 0500 08/25/23 0940  Weight: (!) 150 kg (!) 149.6 kg (!) 149.6 kg    Intake/Output Summary (Last 24 hours) at 08/26/2023 1005 Last data filed at 08/26/2023 0914 Gross per 24 hour  Intake 660 ml  Output 5300 ml  Net -4640 ml    Additional Objective Labs: Basic Metabolic Panel: Recent Labs  Lab 08/20/23 0226 08/21/23 0238 08/22/23 0245 08/23/23 0227 08/24/23 0248 08/25/23 0227 08/26/23 0258  NA 136 137 137   < > 138 141 139  K 4.9 5.4* 4.9   < > 4.8 5.0 4.8  CL 94* 92* 93*   < > 93* 91* 93*  CO2 30 31 30    < > 33* 32 32  GLUCOSE 151* 149* 226*   < > 174* 121* 150*  BUN 98* 113* 90*   < > 81* 103* 84*  CREATININE 6.28* 7.23* 5.97*   < > 5.53* 6.58* 6.19*  CALCIUM 9.4 9.7 9.3   < > 9.6 9.8 9.4  PHOS 6.6* 7.8* 6.9*  --   --   --   --    < > = values in this interval not displayed.   Liver Function  Tests: Recent Labs  Lab 08/20/23 0226 08/21/23 0238 08/22/23 0245  ALBUMIN 2.5* 2.5* 2.4*   CBC: Recent Labs  Lab 08/23/23 0730 08/25/23 0823  WBC 5.7 5.8  HGB 10.3* 10.4*  HCT 33.5* 34.4*  MCV 91.5 92.0  PLT 293 289   Blood Culture    Component Value Date/Time   SDES BLOOD BLOOD RIGHT ARM 08/05/2023 0236   SPECREQUEST  08/05/2023 0236    BOTTLES DRAWN AEROBIC AND ANAEROBIC Blood Culture adequate volume   CULT  08/05/2023 0236    NO GROWTH 5 DAYS Performed at [facility], 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 Request for therapeutic paracentesis. EXAM: ULTRASOUND GUIDED PARACENTESIS MEDICATIONS: 1% lidocaine 10 mL COMPLICATIONS: None immediate. PROCEDURE: Informed written consent was obtained from the patient after a discussion of the risks, benefits and alternatives to treatment. A timeout was performed prior to the initiation of the procedure. Initial ultrasound  scanning demonstrates a large amount of ascites within the right lower abdominal quadrant. The right lower abdomen was prepped and draped in the usual sterile fashion. 1% lidocaine was used for local anesthesia. Following this, a 19 gauge, 7-cm, Yueh catheter was introduced. An ultrasound image was saved for documentation purposes. The paracentesis was performed. The catheter was removed and a dressing was applied. The patient tolerated the procedure well without immediate post procedural complication. FINDINGS: A total of approximately 5 L of clear yellow fluid was removed. IMPRESSION: Successful ultrasound-guided paracentesis yielding 5 liters of peritoneal fluid. Procedure performed by: Corrin Parker, PA-C Electronically Signed   By: Malachy Moan M.D.   On: 08/24/2023 15:03    Medications:   carvedilol  25 mg Oral BID  WC   Chlorhexidine Gluconate Cloth  6 each Topical Q0600   Chlorhexidine Gluconate Cloth  6 each Topical Q0600   darbepoetin (ARANESP) injection - DIALYSIS  200 mcg Subcutaneous Q Mon-1800   feeding supplement (NEPRO CARB STEADY)  237 mL Oral BID BM   heparin  5,000 Units Subcutaneous Q8H   hydrALAZINE  100 mg Oral TID   insulin aspart  0-15 Units Subcutaneous TID WC   insulin aspart  0-5 Units Subcutaneous QHS   insulin aspart  3 Units Subcutaneous TID WC   insulin glargine-yfgn  5 Units Subcutaneous Daily   isosorbide mononitrate  90 mg Oral Daily   loratadine  10 mg Oral Daily   polyethylene glycol  17 g Oral Daily   sacubitril-valsartan  1 tablet Oral BID   sevelamer carbonate  1,600 mg Oral TID WC    Dialysis Orders: TTS - HP 4:15hr, 500/A2, EDW 112kg, 2K/2Ca, AVF, heparin 10K bolus - Mircera q 2 weeks - Hectoral q HD - Venofer course was given prior to admit   Assessment/Plan: 1. Chronic/severe volume overload: Large volume HD, min improvement per scale. Abd [place] 12/17 - still with B pleural effusions and large ascites.  S/p 2 large volume paracentesis. On exam - tiny bit of improvement, hard to tell. He has been unable to stand for weight. Has been drinking more than prior fluid restrictions, lowered to 1.2L/d on 12/20.  Continue to counsel about fluid restrictions.  Provided education about sources of fluids in diet.   Plan for serial HD again over next few days to improve edema and functional status in hopes to discharge early this week.  2. ESRD: Plan for serial dialysis next few days for volume removal. HD today- 6L UFG as tolerated.  Holiday Schedule this week, Monday, Thursday, Saturday.  Next HD on 12/23. 3. HTN/volume:  BP ok, chronic edema. See above. 4. Anemia: Hgb 10.4 - continue Aranesp q Monday while here. 5. Secondary hyperparathyroidism:  CorrCa high, Phos high. VDRA on hold. 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 Dislikes Velphoro and Gean Quint -> try sevelamer as binder. 6. Nutrition:  Alb low, changed from "heart healthy" to renal diet w/1238mL fluid restrictions. 7. T1DM: On insulin. 8. Ascites: Cirrhosis and nephrogenic ascites: S/p para 12/16 and 12/20. 9. LE weakness/pain: No fractures, result of #1 10. Dispo: Unfortunately bed weight exactly the same (or more) from admit. He wants to be home for Christmas with his son.  Discussed he will need improvement in functional status and volume to be able to discharge.      Virgina Norfolk, PA-C  [facility] 08/26/2023,10:05 AM  LOS: 22 days

## 2023-08-26 NOTE — Progress Notes (Addendum)
   08/26/23 1612  Vitals  Temp 97.8 F (36.6 C)  Pulse Rate 83  Resp 16  BP 130/74  SpO2 98 %  O2 Device Nasal Cannula  Oxygen Therapy  O2 Flow Rate (L/min) 4 L/min  Patient Activity (if Appropriate) In bed  Pulse Oximetry Type Continuous  Post Treatment  Dialyzer Clearance Clear  Hemodialysis Intake (mL) 0 mL  Liters Processed 70.2  Fluid Removed (mL) 4300 mL  Tolerated HD Treatment Yes  Post-Hemodialysis Comments Pt tolerated HD procedure well until the last 10 min of treatment, D/t pt C/O Left hand cramping. RN applied a heating pad to hand and pt, states a little relief. Report call to 3E Bedside RN.  AVG/AVF Arterial Site Held (minutes) 8 minutes  AVG/AVF Venous Site Held (minutes) 5 minutes   Received patient in bed to unit.  Alert and oriented.  Informed consent signed and in chart.   TX duration: 3:21  Pt. Had 10 mins left in HD tx and wanted to come off machine, D/T left hand cramping  Patient tolerated well.  Transported back to the room  Alert, without acute distress.  Hand-off given to patient's nurse.   Access used: Yes  Access issues: No  Total UF removed: 4300 instead of 6L Medication(s) given: See MAR Post HD VS: See Above Grid Post HD weight: 137.6 kg   [facility]

## 2023-08-26 NOTE — Progress Notes (Signed)
PT Cancellation Note  Patient Details Name: Bobby [name] MRN: [id number] DOB: [birth year]   Cancelled Treatment:    Reason Eval/Treat Not Completed: (P) Patient declined, no reason specified. Discussed plan with pt this AM to work on tilting pt in bed and plans to coordinate pain meds with RN for session. Pt in agreement. RN attempted to provide pt with pain meds prior to session as planned and originally agreed upon by pt but pt then refused and told RN to leave the room. PT returned at agreed upon time and pt adamantly refusing to participate, just stating "Not today". Pt stating he is trying to get home to his son for Christmas, but does not seem to have a realistic concept on how this may be possible if he cannot mobilize and is primarily just remaining in bed. Pt just stating "I will figure it out". Attempted to have a realistic discussion with pt on how his goal will be achievable if he declines to mobilize or work with PT. In response, pt talking over therapist and increasing volume of his voice while repeating "Not today". Attempted to get a better explanation of why he does not want to participate today and pt reporting he is in pain and plans to go to HD again today. PT offered to have RN attempt to provide pain meds again or come after HD, but pt still refusing. Will re-attempt another day as able.   Virgil Benedict, PT, DPT Acute Rehabilitation Services  Office: [phone]    Bettina Gavia 08/26/2023, 10:05 AM

## 2023-08-27 DIAGNOSIS — J9602 Acute respiratory failure with hypercapnia: Secondary | ICD-10-CM | POA: Diagnosis not present

## 2023-08-27 DIAGNOSIS — J9601 Acute respiratory failure with hypoxia: Secondary | ICD-10-CM | POA: Diagnosis not present

## 2023-08-27 LAB — GLUCOSE, CAPILLARY
Glucose-Capillary: 147 mg/dL — ABNORMAL HIGH (ref 70–99)
Glucose-Capillary: 171 mg/dL — ABNORMAL HIGH (ref 70–99)
Glucose-Capillary: 146 mg/dL — ABNORMAL HIGH (ref 70–99)
Glucose-Capillary: 84 mg/dL (ref 70–99)

## 2023-08-27 MED ORDER — HEPARIN SODIUM (PORCINE) 1000 UNIT/ML DIALYSIS: 10000.0000 [IU] | INTRAMUSCULAR | Status: DC | PRN

## 2023-08-27 MED ORDER — LIDOCAINE HCL (PF) 1 % IJ SOLN: 5.0000 mL | INTRAMUSCULAR | Status: DC | PRN

## 2023-08-27 MED ORDER — HEPARIN SODIUM (PORCINE) 1000 UNIT/ML DIALYSIS: 1000.0000 [IU] | INTRAMUSCULAR | Status: DC | PRN

## 2023-08-27 MED ORDER — LIDOCAINE-PRILOCAINE 2.5-2.5 % EX CREA: 1.0000 | TOPICAL_CREAM | CUTANEOUS | Status: DC | PRN

## 2023-08-27 MED ORDER — OXYCODONE-ACETAMINOPHEN 5-325 MG PO TABS
1.0000 | ORAL_TABLET | Freq: Four times a day (QID) | ORAL | Status: DC | PRN
  Administered 2023-08-27 – 2023-08-28 (×3): 2 via ORAL
  Filled 2023-08-27 (×3): qty 2

## 2023-08-27 MED ORDER — ALTEPLASE 2 MG IJ SOLR: 2.0000 mg | Freq: Once | INTRAMUSCULAR | Status: DC | PRN

## 2023-08-27 MED ORDER — ANTICOAGULANT SODIUM CITRATE 4% (200MG/5ML) IV SOLN: 5.0000 mL | Status: DC | PRN

## 2023-08-27 MED ORDER — PENTAFLUOROPROP-TETRAFLUOROETH EX AERO: 1.0000 | INHALATION_SPRAY | CUTANEOUS | Status: DC | PRN

## 2023-08-27 NOTE — Progress Notes (Signed)
Occupational Therapy Treatment Patient Details Name: Bobby [name] MRN: [id number] DOB: 02-Sep-1996 Today's Date: 08/27/2023   History of present illness Pt is 27 yo presenting to [facility] ED with diffuse muscle aches after missing HD session on Tuesday. Also reporting dyspnea on exertion, worsening abdominal swelling and leg swelling. He was found to be severely volume overloaded on presentation with acute respiratory failure with hypoxia and hypercarbia secondary to pulmonary edema.  Lab work showed hyperkalemia.  Nephrology consulted for serial dialysis.  Patient is from SNF. Patient has persistent weakness in bilateral lower extremities, imagings did not support any fracture or dislocation and LE ultrasound negative for DVT. PMH includes recurrent ascites requiring paracentesis, ESRD (HD TTS), HFrEF (LVEF ~50%), type 1 diabetes, HTN, obesity.   OT comments  Pt progressing towards goals, EOB upon arrival and able to stand x3 holding onto back of recliner chair with mod +2. Pt performing seated ADLs and therex in between. Continues to need incr assist for LB ADLs, would benefit from AE trial in future session. Pt presenting with impairments listed below, will follow acutely. Patient will benefit from continued inpatient follow up therapy, <3 hours/day to maximize safety/ind with ADL/functional mobility.       If plan is discharge home, recommend the following:  Two people to help with walking and/or transfers;Two people to help with bathing/dressing/bathroom;Assistance with cooking/housework;Assist for transportation;Help with stairs or ramp for entrance   Equipment Recommendations  Wheelchair cushion (measurements OT);Wheelchair (measurements OT);Hospital bed;Hoyer lift    Recommendations for Other Services      Precautions / Restrictions Precautions Precautions: Fall Precaution Comments: LE weakness, LE knee pain, LE edema Restrictions Weight Bearing Restrictions Per Provider Order: No        Mobility Bed Mobility               General bed mobility comments: seated EOB at start of session and end of session    Transfers Overall transfer level: Needs assistance Equipment used:  (recliner) Transfers: Sit to/from Stand Sit to Stand: Mod assist, +2 physical assistance, +2 safety/equipment, From elevated surface, Min assist           General transfer comment: x3 stands pulling up from back of recliner     Balance Overall balance assessment: Needs assistance Sitting-balance support: Feet supported, Single extremity supported, Bilateral upper extremity supported Sitting balance-Leahy Scale: Fair     Standing balance support: Bilateral upper extremity supported, During functional activity Standing balance-Leahy Scale: Poor                             ADL either performed or assessed with clinical judgement   ADL Overall ADL's : Needs assistance/impaired     Grooming: Set up;Sitting;Wash/dry face               Lower Body Dressing: Maximal assistance;Sitting/lateral leans                      Extremity/Trunk Assessment Upper Extremity Assessment Upper Extremity Assessment: Overall WFL for tasks assessed   Lower Extremity Assessment Lower Extremity Assessment: Defer to PT evaluation        Vision   Vision Assessment?: No apparent visual deficits   Perception Perception Perception: Not tested   Praxis Praxis Praxis: Not tested    Cognition Arousal: Alert Behavior During Therapy: [facility] for tasks assessed/performed Overall Cognitive Status: Within Functional Limits for tasks assessed  Exercises Other Exercises Other Exercises: shoulder flexion x10 with theraband Other Exercises: shoulder abdx10 with theraband    Shoulder Instructions       General Comments VSS on 4-5L O2    Pertinent Vitals/ Pain       Pain Assessment Pain Assessment: Faces Pain  Score: 4  Faces Pain Scale: Hurts little more Pain Location: bil knees Pain Descriptors / Indicators: Grimacing, Guarding, Sore Pain Intervention(s): Limited activity within patient's tolerance, Monitored during session, Repositioned  Home Living                                          Prior Functioning/Environment              Frequency  Min 1X/week        Progress Toward Goals  OT Goals(current goals can now be found in the care plan section)  Progress towards OT goals: Progressing toward goals  Acute Rehab OT Goals Patient Stated Goal: none stated OT Goal Formulation: With patient Time For Goal Achievement: 09/05/23 Potential to Achieve Goals: Fair ADL Goals Pt Will Perform Lower Body Dressing: with mod assist;sitting/lateral leans Pt Will Transfer to Toilet: with max assist;with +2 assist;stand pivot transfer;with transfer board Pt/caregiver will Perform Home Exercise Program: Both right and left upper extremity;With written HEP provided  Plan      Co-evaluation    PT/OT/SLP Co-Evaluation/Treatment: Yes Reason for Co-Treatment: For patient/therapist safety;To address functional/ADL transfers PT goals addressed during session: Mobility/safety with mobility;Balance;Strengthening/ROM OT goals addressed during session: ADL's and self-care;Strengthening/ROM      AM-PAC OT "6 Clicks" Daily Activity     Outcome Measure   Help from another person eating meals?: None Help from another person taking care of personal grooming?: A Little Help from another person toileting, which includes using toliet, bedpan, or urinal?: A Lot Help from another person bathing (including washing, rinsing, drying)?: A Lot Help from another person to put on and taking off regular upper body clothing?: A Little Help from another person to put on and taking off regular lower body clothing?: Total 6 Click Score: 15    End of Session    OT Visit Diagnosis:  Unsteadiness on feet (R26.81);Muscle weakness (generalized) (M62.81);Other abnormalities of gait and mobility (R26.89);Pain Pain - Right/Left: Left Pain - part of body: Knee   Activity Tolerance Patient tolerated treatment well   Patient Left in bed;with call bell/phone within reach   Nurse Communication Mobility status        Time: 1610-9604 OT Time Calculation (min): 24 min  Charges: OT General Charges $OT Visit: 1 Visit OT Treatments $Self Care/Home Management : 8-22 mins  Carver Fila, OTD, OTR/L SecureChat Preferred Acute Rehab [phone]   Carver Fila Koonce 08/27/2023, 5:21 PM

## 2023-08-27 NOTE — Progress Notes (Signed)
PROGRESS NOTE    Bobby [name]  UJW:[id number] DOB: 1996/01/13 DOA: 08/04/2023 PCP: [facility]  27/M w ESRD on dialysis on TTS schedule, morbid obesity, systolic CHF, cirrhosis, diabetes type 1, recurrent ascites requiring paracentesis, HTN, anxiety who presented with myalgia, shortness of breath, abdominal distention, lower extremity edema.  Noncompliant with outpatient hemodialysis and outpatient paracentesis.   -Found to be severely volume overloaded on presentation and on acute respiratory failure with hypoxia and hypercarbia secondary to pulmonary edema. bun 66 cr 9,5  CXR w diffuse extensive interstitial infiltrates bilaterally (homogenous), with bilateral pleural effusions.  12/11 left knee aspiration and steroid injection.  Patient is from SNF.  Patient has persistent weakness in bilateral lower extremities, imagings did not support any fracture or dislocation. PT/OT consulted , recommendation is to go back to SNF.  12/14 volume status is improving with ultrafiltration, however weight remains largely unchanged 12/16 paracentesis 3,9 L removed 12/20: Repeat paracentesis, 5 L drained  Subjective: Dialyzed extra yesterday, 4.3 L removed  Assessment and Plan:  Acute hypoxic respiratory failure ESRD on dialysis (HCC) Volume overload. Hyponatremia, hyperkalemia  -Undergoing serial HD>aggressive ultrafiltration for volume removal.  -still significantly vol overloaded -per Nephrology, dialyzed extra yesterday, hopefully again tomorrow -Emphasized fluid restriction with patient and RN, weight finally starting to improve a bit, suspect he will need more frequent dialysis to catch up -Repeated paracentesis 12/20 -Discussed with nephrology  Anemia of chronic renal disease with iron deficiency anemia Continue with IV iron and EPO.   Acute metabolic encephalopathy has resolved.  Opiate dose was decreased Gabapentin discontinued, Flexeril decreased with  improvement in his mentation.   Essential hypertension Continue hydralazine/ isosorbide, carvedilol and entresto   Decompensated cirrhosis (HCC) 12/02 paracentesis 3 L removed.  12/16 paracentesis 3.9 L removed.  -gets outpatient paracentesis routinely every 3 to  4 weeks. -Repeat paracentesis 12/20, 5 L drained  Acute on chronic diastolic CHF Echocardiogram 06/2023 (care everywhere) preserved LV systolic function with EF 55 to 60%, mild LVH, possible loculated inferior small size pericardial effusion, RV with normal size, and normal systolic function, LA and RA with normal size, no significant valvular disease.   -Continue ultrafiltration with hemodialysis and intermittent paracentesis -carvedilol, entresto, hydralazine and isosorbide.   Knee pain Bilateral knee pain and back pain, with ambulatory dysfunction. 12/11 left knee aspiration and injection.  No crystals seen, hemarthrosis -Changed to oral pain meds  Positive ambulatory dysfunction.   Uncontrolled type 1 diabetes mellitus with hyperglycemia, with long-term current use of insulin (HCC) CBGs are stable now, continue basal insulin, SSI  DVT prophylaxis: Hep SQ Code Status: Full Code Family Communication: No family at bedside Disposition Plan:-Home when volume status has improved  Consultants:    Procedures:   Antimicrobials:    Objective: Vitals:   08/27/23 0036 08/27/23 0411 08/27/23 0644 08/27/23 1001  BP: (!) 118/50 (!) 106/50  (!) 142/92  Pulse: 83 80  89  Resp: 18 17  19   Temp: 98.3 F (36.8 C) 98.3 F (36.8 C)  (!) 97.4 F (36.3 C)  TempSrc: Oral Oral  Oral  SpO2: 96% 93%  97%  Weight:   (!) 140 kg   Height:        Intake/Output Summary (Last 24 hours) at 08/27/2023 1220 Last data filed at 08/27/2023 0011 Gross per 24 hour  Intake 240 ml  Output 4300 ml  Net -4060 ml   Filed Weights   08/26/23 1150 08/26/23 1622 08/27/23 0644  Weight: (!) 149.6 kg (S) Marland Kitchen)  137.6 kg (!) 140 kg     Examination:  Obese chronically ill male sitting up in bed, AAOx3 HEENT: Positive JVD CVS: S1-S2, regular rhythm Lungs: Decreased breath sounds at the bases  Abdomen: Obese, nontender, positive fluid thrill Extremities: 2+ brawny edema    Data Reviewed:   CBC: Recent Labs  Lab 08/23/23 0730 08/25/23 0823  WBC 5.7 5.8  HGB 10.3* 10.4*  HCT 33.5* 34.4*  MCV 91.5 92.0  PLT 293 289   Basic Metabolic Panel: Recent Labs  Lab 08/21/23 0238 08/22/23 0245 08/23/23 0227 08/24/23 0248 08/25/23 0227 08/26/23 0258  NA 137 137 139 138 141 139  K 5.4* 4.9 4.9 4.8 5.0 4.8  CL 92* 93* 92* 93* 91* 93*  CO2 31 30 33* 33* 32 32  GLUCOSE 149* 226* 209* 174* 121* 150*  BUN 113* 90* 105* 81* 103* 84*  CREATININE 7.23* 5.97* 6.73* 5.53* 6.58* 6.19*  CALCIUM 9.7 9.3 9.3 9.6 9.8 9.4  PHOS 7.8* 6.9*  --   --   --   --    GFR: Estimated Creatinine Clearance: 27.5 mL/min (A) (by C-G formula based on SCr of 6.19 mg/dL (H)). Liver Function Tests: Recent Labs  Lab 08/21/23 0238 08/22/23 0245  ALBUMIN 2.5* 2.4*   No results for input(s): "LIPASE", "AMYLASE" in the last 168 hours. No results for input(s): "AMMONIA" in the last 168 hours. Coagulation Profile: No results for input(s): "INR", "PROTIME" in the last 168 hours. Cardiac Enzymes: No results for input(s): "CKTOTAL", "CKMB", "CKMBINDEX", "TROPONINI" in the last 168 hours. BNP (last 3 results) No results for input(s): "PROBNP" in the last 8760 hours. HbA1C: No results for input(s): "HGBA1C" in the last 72 hours. CBG: Recent Labs  Lab 08/25/23 2204 08/26/23 1006 08/26/23 1641 08/27/23 1000 08/27/23 1127  GLUCAP 144* 124* 142* 147* 171*   Lipid Profile: No results for input(s): "CHOL", "HDL", "LDLCALC", "TRIG", "CHOLHDL", "LDLDIRECT" in the last 72 hours. Thyroid Function Tests: No results for input(s): "TSH", "T4TOTAL", "FREET4", "T3FREE", "THYROIDAB" in the last 72 hours. Anemia Panel: No results for input(s):  "VITAMINB12", "FOLATE", "FERRITIN", "TIBC", "IRON", "RETICCTPCT" in the last 72 hours. Urine analysis:    Component Value Date/Time   COLORURINE YELLOW 03/01/2021 0847   APPEARANCEUR CLEAR 03/01/2021 0847   LABSPEC 1.010 03/01/2021 0847   PHURINE 7.0 03/01/2021 0847   GLUCOSEU 150 (A) 03/01/2021 0847   HGBUR SMALL (A) 03/01/2021 0847   BILIRUBINUR NEGATIVE 03/01/2021 0847   KETONESUR 5 (A) 03/01/2021 0847   PROTEINUR >=300 (A) 03/01/2021 0847   NITRITE NEGATIVE 03/01/2021 0847   LEUKOCYTESUR NEGATIVE 03/01/2021 0847   Sepsis Labs: @LABRCNTIP (procalcitonin:4,lacticidven:4)  )No results found for this or any previous visit (from the past 240 hours).   Radiology Studies: No results found.     Scheduled Meds:  carvedilol  25 mg Oral BID WC   Chlorhexidine Gluconate Cloth  6 each Topical Q0600   darbepoetin (ARANESP) injection - DIALYSIS  200 mcg Subcutaneous Q Mon-1800   feeding supplement (NEPRO CARB STEADY)  237 mL Oral BID BM   heparin  5,000 Units Subcutaneous Q8H   hydrALAZINE  100 mg Oral TID   insulin aspart  0-15 Units Subcutaneous TID WC   insulin aspart  0-5 Units Subcutaneous QHS   insulin aspart  3 Units Subcutaneous TID WC   insulin glargine-yfgn  5 Units Subcutaneous Daily   isosorbide mononitrate  90 mg Oral Daily   loratadine  10 mg Oral Daily   polyethylene glycol  17  g Oral Daily   sacubitril-valsartan  1 tablet Oral BID   sevelamer carbonate  1,600 mg Oral TID WC   Continuous Infusions:     LOS: 23 days    Time spent:    Zannie Cove, MD [facility]   08/27/2023, 12:20 PM

## 2023-08-27 NOTE — Progress Notes (Signed)
Physical Therapy Treatment Patient Details Name: Bobby [name] MRN: [id number] DOB: Sep 29, 1995 Today's Date: 08/27/2023   History of Present Illness Pt is 27 yo presenting to [facility] ED with diffuse muscle aches after missing HD session on Tuesday. Also reporting dyspnea on exertion, worsening abdominal swelling and leg swelling. He was found to be severely volume overloaded on presentation with acute respiratory failure with hypoxia and hypercarbia secondary to pulmonary edema.  Lab work showed hyperkalemia.  Nephrology consulted for serial dialysis.  Patient is from SNF. Patient has persistent weakness in bilateral lower extremities, imagings did not support any fracture or dislocation and LE ultrasound negative for DVT. PMH includes recurrent ascites requiring paracentesis, ESRD (HD TTS), HFrEF (LVEF ~50%), type 1 diabetes, HTN, obesity.    PT Comments  Patient seated at EOB at start and reports having a tough day thinking about his current limitations and plan to return home. Pt agreeable to mobilize with therapy with encouragement. Recliner used today for sit<>stand with support of back of locked chair. Pt completed 3x sit<>stand with +2 min-mod assist; pt video chatting family on ipad during transfer and pt tearful reporting feeling proud. Pt declined to perform additional stands despite encouragement but did complete UE strengthening and self care with OT at EOB. Will continue to progress pt as able during acute stay.     If plan is discharge home, recommend the following: Two people to help with walking and/or transfers;Help with stairs or ramp for entrance;A lot of help with bathing/dressing/bathroom;Assist for transportation;Assistance with cooking/housework   Can travel by private vehicle     No  Equipment Recommendations  None recommended by PT    Recommendations for Other Services       Precautions / Restrictions Precautions Precautions: Fall Precaution Comments: LE weakness, LE  knee pain, LE edema Restrictions Weight Bearing Restrictions Per Provider Order: No     Mobility  Bed Mobility Overal bed mobility: Needs Assistance             General bed mobility comments: seated EOB at start of session    Transfers Overall transfer level: Needs assistance Equipment used:  (recliner) Transfers: Sit to/from Stand Sit to Stand: Mod assist, +2 physical assistance, +2 safety/equipment, From elevated surface, Min assist           General transfer comment: Patient completed 3x sit<>stand from significantly elevated EOB with locked recliner for support. Min-Mod assist +2 to power up.    Ambulation/Gait                   Stairs             Wheelchair Mobility     Tilt Bed    Modified Rankin (Stroke Patients Only)       Balance Overall balance assessment: Needs assistance Sitting-balance support: Feet supported, Single extremity supported, Bilateral upper extremity supported Sitting balance-Leahy Scale: Fair     Standing balance support: Bilateral upper extremity supported, During functional activity Standing balance-Leahy Scale: Poor                              Cognition Arousal: Alert Behavior During Therapy: WFL for tasks assessed/performed Overall Cognitive Status: Within Functional Limits for tasks assessed  Exercises      General Comments        Pertinent Vitals/Pain Pain Assessment Pain Assessment: Faces Faces Pain Scale: Hurts little more Pain Location: bil knees Pain Descriptors / Indicators: Grimacing, Guarding, Sore Pain Intervention(s): Limited activity within patient's tolerance, Monitored during session, Repositioned    Home Living                          Prior Function            PT Goals (current goals can now be found in the care plan section) Acute Rehab PT Goals PT Goal Formulation: With patient/family Time  For Goal Achievement: 09/05/23 Potential to Achieve Goals: Fair Progress towards PT goals: Progressing toward goals    Frequency    Min 1X/week      PT Plan      Co-evaluation PT/OT/SLP Co-Evaluation/Treatment: Yes Reason for Co-Treatment: For patient/therapist safety;To address functional/ADL transfers PT goals addressed during session: Mobility/safety with mobility;Balance;Strengthening/ROM OT goals addressed during session: ADL's and self-care;Strengthening/ROM      AM-PAC PT "6 Clicks" Mobility   Outcome Measure  Help needed turning from your back to your side while in a flat bed without using bedrails?: A Little Help needed moving from lying on your back to sitting on the side of a flat bed without using bedrails?: A Lot Help needed moving to and from a bed to a chair (including a wheelchair)?: Total Help needed standing up from a chair using your arms (e.g., wheelchair or bedside chair)?: Total Help needed to walk in hospital room?: Total Help needed climbing 3-5 steps with a railing? : Total 6 Click Score: 9    End of Session Equipment Utilized During Treatment: Oxygen Activity Tolerance: Patient tolerated treatment well;Patient limited by pain Patient left: in bed;with call bell/phone within reach Nurse Communication: Mobility status;Need for lift equipment PT Visit Diagnosis: Muscle weakness (generalized) (M62.81);Other abnormalities of gait and mobility (R26.89)     Time: 8295-6213 PT Time Calculation (min) (ACUTE ONLY): 33 min  Charges:    $Therapeutic Activity: 8-22 mins PT General Charges $$ ACUTE PT VISIT: 1 Visit                     Wynn Maudlin, DPT Acute Rehabilitation Services Office [phone]  08/27/23 4:57 PM

## 2023-08-27 NOTE — TOC Progression Note (Signed)
Transition of Care [facility]) - Progression Note    Patient Details  Name: Bobby [name] MRN: [id number] Date of Birth: October 03, 1995  Transition of Care [facility]) CM/SW Contact  Ronny Bacon, RN Phone Number: 08/27/2023, 12:03 PM  Clinical Narrative:   I spoke with Case Manager, Tiajuana Amass [phone]) for [facility]. She requested, if possible, to wean him off IV pain medication to something PO. She is concerned if he went home and had pain, he would miss dialysis on Thursday and be back at the hospital. She also would like a palliative care consult before he is discharged. They plan to come visit him while he is in the hospital. Plan of care: Dialysis today and tomorrow with possible discharge on Wednesday. Patient to continue regular dialysis schedule outpatient on Thursday. Provider notified of case manager's concerns and requests.    Expected Discharge Plan: Skilled Nursing Facility Barriers to Discharge: Continued Medical Work up  Expected Discharge Plan and Services In-house Referral: Clinical Social Work     Living arrangements for the past 2 months: Skilled Nursing Facility                 DME Arranged: Hospital bed DME Agency: [employer] Date DME Agency Contacted: 08/27/23 Time DME Agency Contacted: 1031 Representative spoke with at DME Agency: Vaughan Basta HH Arranged: PT, OT HH Agency: [facility] Date [facility] Agency Contacted: 08/16/23 Time HH Agency Contacted: 1536 Representative spoke with at [facility] Agency: Tresa Endo   Social Determinants of Health (SDOH) Interventions SDOH Screenings   Food Insecurity: No Food Insecurity (08/05/2023)  Recent Concern: Food Insecurity - High Risk (05/29/2023)   [facility] from [facility]  Housing: Low Risk  (08/05/2023)  Transportation Needs: No Transportation Needs (08/05/2023)  Recent Concern: Transportation Needs - Unmet Transportation Needs (07/10/2023)   Received from [facility]  Utilities: Not At  Risk (08/05/2023)  Recent Concern: Utilities - Medium Risk (05/29/2023)   Received from [facility]  Financial Resource Strain: Low Risk  (07/20/2022)   [facility] visits prior to 11/04/2022., [facility], [facility] visits prior to 11/04/2022., [facility]  Physical Activity: Insufficiently Active (07/20/2022)   [facility], [facility] visits prior to 11/04/2022., [facility] visits prior to 11/04/2022., [facility]  Social Connections: Unknown (07/20/2022)   [facility] visits prior to 11/04/2022., [facility] visits prior to 11/04/2022.  Stress: No Stress Concern Present (07/20/2022)   Received from [facility], [facility] visits prior to 11/04/2022., [facility] visits prior to 11/04/2022., [facility]  Tobacco Use: Medium Risk (08/04/2023)    Readmission Risk Interventions    04/19/2023    3:26 PM 03/11/2021    3:57 PM  Readmission Risk Prevention Plan  Transportation Screening Complete Complete  PCP or Specialist Appt within 5-7 Days Complete Not Complete  Not Complete comments  03/29/21 is first available appt  Home Care Screening Complete Complete  Medication Review (RN CM) Referral to Pharmacy

## 2023-08-27 NOTE — Plan of Care (Signed)
  Problem: Clinical Measurements: Goal: Respiratory complications will improve Outcome: Progressing   Problem: Coping: Goal: Ability to adjust to condition or change in health will improve Outcome: Progressing

## 2023-08-27 NOTE — TOC Transition Note (Signed)
Transition of Care [facility]) - Discharge Note   Patient Details  Name: Bobby [name] MRN: [id number] Date of Birth: 06-15-1996  Transition of Care [name]) CM/SW Contact:  Ronny Bacon, RN Phone Number: 08/27/2023, 11:37 AM   Clinical Narrative:  During progression rounds, need for hospital bed discussed. Hospital bed ordered through [facility] with [employer].     Final next level of care: Home/Self Care Barriers to Discharge: Continued Medical Work up   Patient Goals and CMS Choice Patient states their goals for this hospitalization and ongoing recovery are:: To go to another SNF          Discharge Placement                       Discharge Plan and Services Additional resources added to the After Visit Summary for   In-house Referral: Clinical Social Work              DME Arranged: Hospital bed DME Agency: [employer] Date DME Agency Contacted: 08/27/23 Time DME Agency Contacted: 1031 Representative spoke with at DME Agency: Vaughan Basta HH Arranged: PT, OT HH Agency: [facility] Date [facility] Agency Contacted: 08/16/23 Time HH Agency Contacted: 1536 Representative spoke with at [facility] Agency: Tresa Endo  Social Drivers of Health (SDOH) Interventions SDOH Screenings   Food Insecurity: No Food Insecurity (08/05/2023)  Recent Concern: Food Insecurity - High Risk (05/29/2023)   [facility]  Housing: Low Risk  (08/05/2023)  Transportation Needs: No Transportation Needs (08/05/2023)  Recent Concern: Transportation Needs - Unmet Transportation Needs (07/10/2023)   Received from [facility]  Utilities: Not At Risk (08/05/2023)  Recent Concern: Utilities - Medium Risk (05/29/2023)   Received from [facility]  Financial Resource Strain: Low Risk  (07/20/2022)   [facility] visits prior to 11/04/2022., [facility], [facility] visits prior to 11/04/2022., [facility]  Physical Activity:  Insufficiently Active (07/20/2022)   [facility], [facility] visits prior to 11/04/2022., [facility] visits prior to 11/04/2022., [facility]  Social Connections: Unknown (07/20/2022)   [facility] visits prior to 11/04/2022., [facility] visits prior to 11/04/2022.  Stress: No Stress Concern Present (07/20/2022)   [facility], [facility] visits prior to 11/04/2022., [facility] visits prior to 11/04/2022., [facility]  Tobacco Use: Medium Risk (08/04/2023)     Readmission Risk Interventions    04/19/2023    3:26 PM 03/11/2021    3:57 PM  Readmission Risk Prevention Plan  Transportation Screening Complete Complete  PCP or Specialist Appt within 5-7 Days Complete Not Complete  Not Complete comments  03/29/21 is first available appt  Home Care Screening Complete Complete  Medication Review (RN CM) Referral to Pharmacy

## 2023-08-27 NOTE — Progress Notes (Signed)
Pt refused CBG check and vital signs. Pt refused insulin at this time. Zannie Cove, MD made aware.

## 2023-08-27 NOTE — Plan of Care (Signed)
  Problem: Pain Management: Goal: General experience of comfort will improve Outcome: Progressing

## 2023-08-27 NOTE — Progress Notes (Signed)
Pt. Refused CBG/finger stick. On call for [facility] paged to make aware.

## 2023-08-27 NOTE — Progress Notes (Signed)
[facility] Progress Note   Dialysis Orders: TTS - HP 4:15hr, 500/A2, EDW 112kg, 2K/2Ca, AVF, heparin 10K bolus - Mircera q 2 weeks - Hectoral q HD - Venofer course was given prior to admit   Assessment/Plan: 1. Chronic/severe volume overload: Large volume HD, min improvement per scale. Abd [place] 12/17 - still with B pleural effusions and large ascites.  S/p 2 large volume paracentesis. On exam - tiny bit of improvement, hard to tell. He has been unable to stand for weight. Has been drinking more than prior fluid restrictions, lowered to 1.2L/d on 12/20.  Continue to counsel about fluid restrictions.  Provided education about sources of fluids in diet.   Plan for serial HD again over next few days to improve edema and functional status in hopes to discharge early this week.  2. ESRD: Plan for serial dialysis next few days for volume removal. HD today- 6L UFG as tolerated.  Holiday Schedule this week, Monday, Thursday, Saturday.  Next HD on 12/23 (today); back-to-back days but he really needs it.  He has at least 20 kg on him, more likely much more than that.  He is planning on being discharged tomorrow to see his son who is coming in from out of town which is understandable.  I counseled him at length about being compliant with fluid restriction as he may end up coming right back to the hospital before his next outpatient treatment on Thursday.  I also told him the holiday schedule and that he needs to be there Thursday and Saturday.  Has a history of severe noncompliance.  4.3L off on 12/22. Dialyzing today mainly for ultrafiltration  3. HTN/volume:  BP ok, chronic edema. See above. 4. Anemia: Hgb 10.4 - continue Aranesp q Monday while here. 5. Secondary hyperparathyroidism:  CorrCa high, Phos high. VDRA on hold. 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 Dislikes Velphoro and Gean Quint -> try sevelamer as binder.  Noncompliant with binders as well and diet. 6. Nutrition:  Alb low, changed from "heart healthy" to renal diet w/1282mL fluid restrictions. 7. T1DM: On insulin. 8. Ascites: Cirrhosis and nephrogenic ascites: S/p para 12/16 and 12/20. 9. LE weakness/pain: No fractures, result of #1 10. Dispo: Unfortunately bed weight exactly the same (or more) from admit. He wants to be home for Christmas with his son.  Discussed he will need improvement in functional status and volume to be able to discharge.    Subjective:   Patient seen and examined at bedside. Reports cramping at end of HD yesterday -> signed off early Sat.  Discussed compliance and importance of completing treatments and following fluid restrictions to improve functional status so he will be able to go home.  Educated again about different sources of fluid in diet.  Tolerated dialysis on Sunday.  Denies CP, SOB, abdominal pain and n/v/d.  Motivated to improve because he wants to be home for Christmas with his son who is in town from [place].   Objective Vitals:   08/26/23 2037 08/27/23 0036 08/27/23 0411 08/27/23 0644  BP: 137/77 (!) 118/50 (!) 106/50   Pulse: 80 83 80   Resp: 17 18 17    Temp: 97.7 F (36.5 C) 98.3 F (36.8 C) 98.3 F (36.8 C)   TempSrc: Oral Oral Oral   SpO2: 94% 96% 93%   Weight:    (!) 140  kg  Height:       Physical Exam General:chronically ill appearing male in NAD Heart:RRR, no mrg Lungs:CTAB, nml WOB on 4L Abdomen:soft, NT, +distended Extremities:2+ LE dense edema from hips to feet Dialysis Access: LU AVF   Filed Weights   08/26/23 1150 08/26/23 1622 08/27/23 0644  Weight: (!) 149.6 kg (S) (!) 137.6 kg (!) 140 kg    Intake/Output Summary (Last 24 hours) at 08/27/2023 1005 Last data filed at 08/27/2023 0011 Gross per 24 hour  Intake 240 ml  Output 4300 ml  Net -4060 ml    Additional Objective Labs: Basic Metabolic  Panel: Recent Labs  Lab 08/21/23 0238 08/22/23 0245 08/23/23 0227 08/24/23 0248 08/25/23 0227 08/26/23 0258  NA 137 137   < > 138 141 139  K 5.4* 4.9   < > 4.8 5.0 4.8  CL 92* 93*   < > 93* 91* 93*  CO2 31 30   < > 33* 32 32  GLUCOSE 149* 226*   < > 174* 121* 150*  BUN 113* 90*   < > 81* 103* 84*  CREATININE 7.23* 5.97*   < > 5.53* 6.58* 6.19*  CALCIUM 9.7 9.3   < > 9.6 9.8 9.4  PHOS 7.8* 6.9*  --   --   --   --    < > = values in this interval not displayed.   Liver Function Tests: Recent Labs  Lab 08/21/23 0238 08/22/23 0245  ALBUMIN 2.5* 2.4*   CBC: Recent Labs  Lab 08/23/23 0730 08/25/23 0823  WBC 5.7 5.8  HGB 10.3* 10.4*  HCT 33.5* 34.4*  MCV 91.5 92.0  PLT 293 289   Blood Culture    Component Value Date/Time   SDES BLOOD BLOOD RIGHT ARM 08/05/2023 0236   SPECREQUEST  08/05/2023 0236    BOTTLES DRAWN AEROBIC AND ANAEROBIC Blood Culture adequate volume   CULT  08/05/2023 0236    NO GROWTH 5 DAYS Performed at [facility], [address], [place], [place] [address]    REPTSTATUS 08/10/2023 FINAL 08/05/2023 0236    Studies/Results: No results found.   Medications:   carvedilol  25 mg Oral BID WC   Chlorhexidine Gluconate Cloth  6 each Topical Q0600   darbepoetin (ARANESP) injection - DIALYSIS  200 mcg Subcutaneous Q Mon-1800   feeding supplement (NEPRO CARB STEADY)  237 mL Oral BID BM   heparin  5,000 Units Subcutaneous Q8H   hydrALAZINE  100 mg Oral TID   insulin aspart  0-15 Units Subcutaneous TID WC   insulin aspart  0-5 Units Subcutaneous QHS   insulin aspart  3 Units Subcutaneous TID WC   insulin glargine-yfgn  5 Units Subcutaneous Daily   isosorbide mononitrate  90 mg Oral Daily   loratadine  10 mg Oral Daily   polyethylene glycol  17 g Oral Daily   sacubitril-valsartan  1 tablet Oral BID   sevelamer carbonate  1,600 mg Oral TID WC

## 2023-08-27 NOTE — Progress Notes (Signed)
Contacted with request from nephrologist to check to see if [facility] can treat pt tomorrow. Contacted clinic and spoke to clinic Production designer, theatre/television/film. Clinic has no availability to treat pt tomorrow. Clinic can resume pt on Thursday (normal day) should pt d/c to home tomorrow. Will assist as needed.   Olivia Canter Renal Navigator [phone]

## 2023-08-28 DIAGNOSIS — Z992 Dependence on renal dialysis: Secondary | ICD-10-CM | POA: Diagnosis not present

## 2023-08-28 DIAGNOSIS — J9602 Acute respiratory failure with hypercapnia: Secondary | ICD-10-CM | POA: Diagnosis not present

## 2023-08-28 DIAGNOSIS — N186 End stage renal disease: Secondary | ICD-10-CM | POA: Diagnosis not present

## 2023-08-28 DIAGNOSIS — J9601 Acute respiratory failure with hypoxia: Secondary | ICD-10-CM | POA: Diagnosis not present

## 2023-08-28 LAB — RENAL FUNCTION PANEL
Albumin: 2.6 g/dL — ABNORMAL LOW (ref 3.5–5.0)
Anion gap: 15 (ref 5–15)
BUN: 85 mg/dL — ABNORMAL HIGH (ref 6–20)
CO2: 32 mmol/L (ref 22–32)
Calcium: 9.5 mg/dL (ref 8.9–10.3)
Chloride: 94 mmol/L — ABNORMAL LOW (ref 98–111)
Creatinine, Ser: 6.22 mg/dL — ABNORMAL HIGH (ref 0.61–1.24)
GFR, Estimated: 12 mL/min — ABNORMAL LOW (ref >60)
Glucose, Bld: 105 mg/dL — ABNORMAL HIGH (ref 70–99)
Phosphorus: 7 mg/dL — ABNORMAL HIGH (ref 2.5–4.6)
Potassium: 4.9 mmol/L (ref 3.5–5.1)
Sodium: 141 mmol/L (ref 135–145)

## 2023-08-28 LAB — GLUCOSE, CAPILLARY
Glucose-Capillary: 148 mg/dL — ABNORMAL HIGH (ref 70–99)
Glucose-Capillary: 133 mg/dL — ABNORMAL HIGH (ref 70–99)
Glucose-Capillary: 164 mg/dL — ABNORMAL HIGH (ref 70–99)

## 2023-08-28 LAB — BASIC METABOLIC PANEL
Anion gap: 16 — ABNORMAL HIGH (ref 5–15)
BUN: 58 mg/dL — ABNORMAL HIGH (ref 6–20)
CO2: 29 mmol/L (ref 22–32)
Calcium: 9.7 mg/dL (ref 8.9–10.3)
Chloride: 93 mmol/L — ABNORMAL LOW (ref 98–111)
Creatinine, Ser: 5.05 mg/dL — ABNORMAL HIGH (ref 0.61–1.24)
GFR, Estimated: 15 mL/min — ABNORMAL LOW (ref >60)
Glucose, Bld: 146 mg/dL — ABNORMAL HIGH (ref 70–99)
Potassium: 4.2 mmol/L (ref 3.5–5.1)
Sodium: 138 mmol/L (ref 135–145)

## 2023-08-28 LAB — HEPATITIS B SURFACE ANTIGEN: Hepatitis B Surface Ag: NONREACTIVE

## 2023-08-28 MED ORDER — OXYCODONE-ACETAMINOPHEN 10-325 MG PO TABS: 1.0000 | ORAL_TABLET | Freq: Four times a day (QID) | ORAL | 0 refills | Status: DC | PRN

## 2023-08-28 MED ORDER — SEVELAMER CARBONATE 800 MG PO TABS: 1600.0000 mg | ORAL_TABLET | Freq: Three times a day (TID) | ORAL | 1 refills | Status: DC

## 2023-08-28 MED ORDER — POLYETHYLENE GLYCOL 3350 17 G PO PACK: 17.0000 g | PACK | Freq: Every day | ORAL | 0 refills | Status: DC

## 2023-08-28 MED ORDER — INSULIN ASPART 100 UNIT/ML IJ SOLN: 3.0000 [IU] | Freq: Three times a day (TID) | INTRAMUSCULAR | 1 refills | Status: DC

## 2023-08-28 MED ORDER — INSULIN GLARGINE-YFGN 100 UNIT/ML ~~LOC~~ SOLN: 5.0000 [IU] | Freq: Every day | SUBCUTANEOUS | 1 refills | Status: DC

## 2023-08-28 NOTE — Care Management Important Message (Signed)
Important Message  Patient Details  Name: Bobby [name] MRN: [id number] Date of Birth: 11-Jan-1996   Important Message Given:  Yes - Medicare IM     Renie Ora 08/28/2023, 11:17 AM

## 2023-08-28 NOTE — Progress Notes (Signed)
Mobility Specialist Progress Note:    08/28/23 1152  Mobility  Activity Moved into chair position in bed (bed mobility)  Level of Assistance Moderate assist, patient does 50-74% (+2)  Assistive Device Other (Comment) (HHA)  Activity Response Tolerated well  Mobility Referral Yes  Mobility visit 1 Mobility  Mobility Specialist Start Time (ACUTE ONLY) 1140  Mobility Specialist Stop Time (ACUTE ONLY) 1150  Mobility Specialist Time Calculation (min) (ACUTE ONLY) 10 min   Pt received seated EOB requesting assistance to lay down to use Bedpan. Needed ModA +2 to swing legs up in bed. Was able to turn self in bed. No c/o throughout. Call bell and personal belongings in reach. All needs met.  Thompson Grayer Mobility Specialist  Please contact vis Secure Chat or  Rehab Office [phone]

## 2023-08-28 NOTE — Progress Notes (Signed)
SATURATION QUALIFICATIONS: (This note is used to comply with regulatory documentation for home oxygen)  Patient Saturations on Room Air at Rest = 89%  Patient Saturations on 4 Liters of oxygen while at rest = 96%  Please briefly explain why patient needs home oxygen: Patient desaturates without oxygen. Wears O2 at baseline.

## 2023-08-28 NOTE — Progress Notes (Signed)
SATURATION QUALIFICATIONS: (This note is used to comply with regulatory documentation for home oxygen)  Patient Saturations on Room Air at Rest while sitting EOB = 89%  Patient Saturations on 4 Liters of oxygen sitting on EOB = 96%  Please briefly explain why patient needs home oxygen:

## 2023-08-28 NOTE — Progress Notes (Signed)
SATURATION QUALIFICATIONS: (This note is used to comply with regulatory documentation for home oxygen)  Patient Saturations on Room Air at Rest = 89%  Patient Saturations on Room Air while sitting at edge of bed 87%  Patient Saturations on 4 Liters of oxygen while Ambulating = 96%  Please briefly explain why patient needs home oxygen: Patient requires 4L of oxygen while ambulating to maintain saturations at an acceptable level

## 2023-08-28 NOTE — Progress Notes (Signed)
   08/28/23 0500  Vitals  Temp 97.7 F (36.5 C)  Temp Source Oral  BP (!) 143/76  MAP (mmHg) 96  BP Location Right Arm  BP Method Automatic  Patient Position (if appropriate) Lying  Pulse Rate 79  ECG Heart Rate 81  Resp 13  Oxygen Therapy  SpO2 100 %  O2 Device Nasal Cannula  O2 Flow Rate (L/min) 4 L/min  During Treatment Monitoring  Blood Flow Rate (mL/min) 199 mL/min  Arterial Pressure (mmHg) -79.59 mmHg  Venous Pressure (mmHg) 141.41 mmHg  TMP (mmHg) 28.68 mmHg  Ultrafiltration Rate (mL/min) 1835 mL/min  Dialysate Flow Rate (mL/min) 300 ml/min  Dialysate Potassium Concentration 2  Dialysate Calcium Concentration 2.5  Duration of HD Treatment -hour(s) 3.5 hour(s)  Cumulative Fluid Removed (mL) per Treatment  6000.28  HD Safety Checks Performed Yes  Intra-Hemodialysis Comments Tx completed  Post Treatment  Dialyzer Clearance Lightly streaked  Liters Processed 76.7  Fluid Removed (mL) 6000 mL  Tolerated HD Treatment Yes  Post-Hemodialysis Comments Pt CAOX 4  AVG/AVF Arterial Site Held (minutes) 10 minutes  AVG/AVF Venous Site Held (minutes) 10 minutes  Fistula / Graft Left Upper arm Arteriovenous fistula  Placement Date/Time: 03/04/21 1327   Placed prior to admission: No  Orientation: Left  Access Location: Upper arm  Access Type: (c) Arteriovenous fistula  Site Condition No complications  Fistula / Graft Assessment Thrill;Bruit  Status Deaccessed  Needle Size 15  Drainage Description None

## 2023-08-28 NOTE — TOC Transition Note (Addendum)
Transition of Care [facility]) - Discharge Note   Patient Details  Name: Bobby [name] MRN: [id number] Date of Birth: [birth year]  Transition of Care [facility]) CM/SW Contact:  Epifanio Lesches, RN Phone Number: 08/28/2023, 11:20 AM   Clinical Narrative:    Patient will DC to: home  Anticipated DC date: 08/28/2023 Family notified: yes , sister Transport by: [facility]  Chronic/severe volume overload/ ESRD Per MD patient ready for DC today after HD session for today. RN, patient, patient's  sister [name] , and [facility] notified of DC. Pt declined SNF placement. Patient agreeable to outpatient palliative care. Pt without provider proference. Referral made with [facility]. Awaiting hospital bed to be delivered to pt's home by [facility] and  per MD pt will need home oxygen, Referral made with [facility] and accepted pending MD's order.  DC packet on chart.  [facility]/ambulance transport will be requested for patient once home oxygen delivered and pt has received HD session for today Sister Rasheeda to receive pt.Lamonte Richer Sister Emergency Contact [phone] Sister to pick up RX meds from local pharmacy.  Post hospital f/u noted on AVS.   08/28/2023 @ 1628 NCM spoke pt's sister to confirm delivery of DME oxygen and hospital bed. States states [employer] out from delivery equipment. [facility] requested  for pt pickup.Transportation papers with d/c packet.  Per [facility] they have 4 pts ahead of pt. States they should pick pt up within  2 hours, nurse and sister made aware.  08/28/2023 1635 Equipment has delivered to pt's home sister [name] states.  RNCM will sign off for now as intervention is no longer needed. Please consult [place] again if new needs arise.    Final next level of care: Home w Home Health Services Barriers to Discharge: Other (must enter comment) (awaiting hospital bed and oxygen delivery to pt's home)   Patient Goals and CMS Choice Patient  states their goals for this hospitalization and ongoing recovery are:: To go to another SNF   Choice offered to / list presented to : Patient      Discharge Placement                       Discharge Plan and Services Additional resources added to the After Visit Summary for   In-house Referral: Clinical Social Work              DME Arranged: Oxygen DME Agency: [employer] Date DME Agency Contacted: 08/28/23 Time DME Agency Contacted: 1119 Representative spoke with at DME Agency: Vaughan Basta HH Arranged: PT, OT, RN, NA HH Agency: [facility] Date [facility] Agency Contacted: 08/16/23 Time HH Agency Contacted: 1536 Representative spoke with at [facility] Agency: Tresa Endo  Social Drivers of Health (SDOH) Interventions SDOH Screenings   Food Insecurity: No Food Insecurity (08/05/2023)  Recent Concern: Food Insecurity - High Risk (05/29/2023)   Received from [facility]  Housing: Low Risk  (08/05/2023)  Transportation Needs: No Transportation Needs (08/05/2023)  Recent Concern: Transportation Needs - Unmet Transportation Needs (07/10/2023)   Received from [facility]  Utilities: Not At Risk (08/05/2023)  Recent Concern: Utilities - Medium Risk (05/29/2023)   Received from [facility]  Financial Resource Strain: Low Risk  (07/20/2022)   [facility] visits prior to 11/04/2022., [facility], [facility] visits prior to 11/04/2022., [facility]  Physical Activity: Insufficiently Active (07/20/2022)   [facility] [employer], [facility] visits prior to 11/04/2022., [facility] visits prior to 11/04/2022., [facility]  Social Connections: Unknown (07/20/2022)   [facility] visits prior to 11/04/2022., [facility] visits prior to 11/04/2022.  Stress: No Stress Concern Present (07/20/2022)   Received from [facility], [facility]  [facility] visits prior to 11/04/2022., [facility] visits prior to 11/04/2022., [facility]  Tobacco Use: Medium Risk (08/04/2023)     Readmission Risk Interventions    04/19/2023    3:26 PM 03/11/2021    3:57 PM  Readmission Risk Prevention Plan  Transportation Screening Complete Complete  PCP or Specialist Appt within 5-7 Days Complete Not Complete  Not Complete comments  03/29/21 is first available appt  Home Care Screening Complete Complete  Medication Review (RN CM) Referral to Pharmacy

## 2023-08-28 NOTE — Discharge Summary (Signed)
Physician Discharge Summary  Lavan Disanti MWU:[id number] DOB: 17-Aug-1996 DOA: 08/04/2023  PCP: [facility]  Admit date: 08/04/2023 Discharge date: 08/28/2023  Time spent: 45 minutes  Recommendations for Outpatient Follow-up:  Outpatient dialysis already schedule Monday Thursday Saturday, continue to lower dry weight as outpatient  Discharge Diagnoses:  Chronic volume overload Acute on chronic systolic CHF Hypoalbuminemia Severe debility   ESRD on dialysis [facility])   Essential hypertension   Cirrhosis (HCC)   Acute on chronic HFrEF (heart failure with reduced ejection fraction) (HCC)   Knee pain   Uncontrolled type 1 diabetes mellitus with hyperglycemia, with long-term current use of insulin (HCC)   Discharge Condition: Guarded  Diet recommendation: Renal, diabetic  Filed Weights   08/26/23 1150 08/26/23 1622 08/27/23 0644  Weight: (!) 149.6 kg (S) (!) 137.6 kg (!) 140 kg    History of present illness:  27/M w ESRD on dialysis on TTS schedule, morbid obesity, systolic CHF, cirrhosis, diabetes type 1, recurrent ascites requiring paracentesis, HTN, anxiety who presented with myalgia, shortness of breath, abdominal distention, lower extremity edema.  Noncompliant with outpatient hemodialysis and outpatient paracentesis.   -Found to be severely volume overloaded on presentation and on acute respiratory failure with hypoxia and hypercarbia secondary to pulmonary edema. bun 66 cr 9,5  CXR w diffuse extensive interstitial infiltrates bilaterally (homogenous), with bilateral pleural effusions.  12/11 left knee aspiration and steroid injection.  Patient is from SNF.  Patient has persistent weakness in bilateral lower extremities, imagings did not support any fracture or dislocation. PT/OT consulted , recommendation is to go back to SNF.  12/14 volume status is improving with ultrafiltration, however weight remains largely unchanged 12/16 paracentesis 3,9 L  removed 12/20: Repeat paracentesis, 5 L drained  Hospital Course:   Acute hypoxic respiratory failure ESRD on dialysis (HCC) Chronic volume overload. Hyponatremia, hyperkalemia  -Undergoing serial HD>aggressive ultrafiltration for volume removal.  -Compounded by hypoalbuminemia from cirrhosis and third spacing -Emphasized fluid restriction with patient and RN, weight finally starting to improve a bit, still significantly volume overloaded which is chronic, he is less symptomatic at this time and insists on going home to see his son who is visiting from out of state for Christmas -Repeated paracentesis 12/20 -Discussed with nephrology dialyzed overnight, 6 L taken off, discussed with nephrology, patient will be discharged home for Christmas, holiday dialysis schedule was discussed with patient in detail, compliance emphasized, will need continued volume removal at dialysis after discharge   Anemia of chronic renal disease with iron deficiency anemia Continue with iron and EPO.  With dialysis   Acute metabolic encephalopathy has resolved.  Opiate dose was decreased Gabapentin discontinued, Flexeril decreased with improvement in his mentation.    Essential hypertension Continue hydralazine/ isosorbide, carvedilol and entresto    Decompensated cirrhosis (HCC) 12/02 paracentesis 3 L removed.  12/16 paracentesis 3.9 L removed.  -gets outpatient paracentesis routinely every 3 to  4 weeks. -Repeat paracentesis 12/20, 5 L drained   Acute on chronic diastolic CHF Echocardiogram 06/2023 (care everywhere) preserved LV systolic function with EF 55 to 60%, mild LVH, possible loculated inferior small size pericardial effusion, RV with normal size, and normal systolic function, LA and RA with normal size, no significant valvular disease.   -Continue ultrafiltration with hemodialysis and intermittent paracentesis -carvedilol, entresto, hydralazine and isosorbide.    Knee pain Bilateral knee pain  and back pain, with ambulatory dysfunction. 12/11 left knee aspiration and injection.  No crystals seen, hemarthrosis -Changed to oral pain meds  Positive ambulatory dysfunction.    Uncontrolled type 1 diabetes mellitus with hyperglycemia, with long-term current use of insulin (HCC) CBGs are stable now, continue basal insulin, SSI   Discharge Exam: Vitals:   08/28/23 0714 08/28/23 1200  BP: (!) 146/80   Pulse: 84   Resp: 16   Temp: 97.7 F (36.5 C)   SpO2: 96% (!) 89%   Obese chronically ill male sitting up in bed, AAOx3 HEENT: Positive JVD CVS: S1-S2, regular rhythm Lungs: Decreased breath sounds at the bases  Abdomen: Obese, nontender, positive fluid thrill Extremities: 2+ brawny edema  Discharge Instructions   Discharge Instructions     Discharge instructions   Complete by: As directed    Please follow a renal diet, low-sodium, fluid restriction   Discharge wound care:   Complete by: As directed    routine   Increase activity slowly   Complete by: As directed       Allergies as of 08/28/2023   No Known Allergies      Medication List     STOP taking these medications    feeding supplement Liqd   hydrOXYzine 25 MG tablet Commonly known as: ATARAX   lidocaine 5 % Commonly known as: LIDODERM   loratadine 10 MG tablet Commonly known as: CLARITIN   oxyCODONE 15 MG immediate release tablet Commonly known as: ROXICODONE   Oxycodone HCl 10 MG Tabs   TRAMADOL HCL PO   Velphoro 500 MG chewable tablet Generic drug: sucroferric oxyhydroxide       TAKE these medications    albuterol 108 (90 Base) MCG/ACT inhaler Commonly known as: VENTOLIN HFA Inhale 1 puff into the lungs every 6 (six) hours as needed for shortness of breath or wheezing.   Aranesp (Albumin Free) 150 MCG/0.3ML Sosy injection Generic drug: Darbepoetin Alfa Inject 0.3 mLs (150 mcg total) into the skin every Monday at 6 PM. Given with dialysis   Biofreeze Roll-On 4 %  Gel Generic drug: Menthol (Topical Analgesic) Apply 1 Application topically 3 (three) times daily. Left knee   Calcium Acetate 668 (169 Ca) MG Tabs Take 1,334 mg by mouth 3 (three) times daily.   carvedilol 25 MG tablet Commonly known as: COREG Take 1 tablet (25 mg total) by mouth 2 (two) times daily with a meal.   cinacalcet 30 MG tablet Commonly known as: SENSIPAR Take 30 mg by mouth daily.   cyclobenzaprine 10 MG tablet Commonly known as: FLEXERIL Take 1 tablet (10 mg total) by mouth 2 (two) times daily as needed for muscle spasms.   folic acid 1 MG tablet Commonly known as: FOLVITE Take 1 mg by mouth daily.   gabapentin 100 MG capsule Commonly known as: NEURONTIN Take 100 mg by mouth 3 (three) times daily.   hydrALAZINE 100 MG tablet Commonly known as: APRESOLINE Take 100 mg by mouth 3 (three) times daily.   insulin aspart 100 UNIT/ML injection Commonly known as: novoLOG Inject 3 Units into the skin 3 (three) times daily with meals.   insulin glargine-yfgn 100 UNIT/ML injection Commonly known as: SEMGLEE Inject 0.05 mLs (5 Units total) into the skin daily. Start taking on: August 29, 2023   isosorbide mononitrate 60 MG 24 hr tablet Commonly known as: IMDUR Take 90 mg by mouth at bedtime.   lanthanum 1000 MG chewable tablet Commonly known as: FOSRENOL Chew 1,000 mg by mouth 5 (five) times daily.   Melatonin Maximum Strength 5 MG Tabs Generic drug: melatonin Take 5 mg by mouth at bedtime.  Methylcobalamin 5000 MCG Tbdp Take 5,000 mcg by mouth daily.   ondansetron 4 MG tablet Commonly known as: ZOFRAN Take 4 mg by mouth every 8 (eight) hours as needed for nausea or vomiting.   oxyCODONE-acetaminophen 10-325 MG tablet Commonly known as: PERCOCET Take 1 tablet by mouth every 6 (six) hours as needed for pain.   polyethylene glycol 17 g packet Commonly known as: MiraLax Take 17 g by mouth daily.   PROTEIN PO Take 30 mLs by mouth 2 (two) times  daily.   sacubitril-valsartan 49-51 MG Commonly known as: ENTRESTO Take 1 tablet by mouth 2 (two) times daily.   sevelamer carbonate 800 MG tablet Commonly known as: RENVELA Take 2 tablets (1,600 mg total) by mouth 3 (three) times daily with meals.               Durable Medical Equipment  (From admission, onward)           Start     Ordered   08/27/23 1145  For home use only DME Hospital bed  Once       Question Answer Comment  Length of Need 12 Months   The above medical condition requires: Patient requires the ability to reposition frequently   Head must be elevated greater than: 45 degrees   Bed type Semi-electric   Support Surface: Low Air loss Mattress      08/27/23 1145              Discharge Care Instructions  (From admission, onward)           Start     Ordered   08/28/23 0000  Discharge wound care:       Comments: routine   08/28/23 1025           No Known Allergies  Follow-up Information     [facility] Follow up.   Specialty: Home Health Services Why: Agency will call you to set up apt times, Contact information: [address] [place] [address] [phone]         [facility] [place], [place]. Follow up.   Contact information: [address] [place] [address] [phone]         [facility] Follow up.   Why: Referral made for outpatient palliative care. Office will call and arrange appointment time. Contact information: [address] [place] [address] [phone]                  The results of significant diagnostics from this hospitalization (including imaging, microbiology, ancillary and laboratory) are listed below for reference.    Significant Diagnostic Studies: IR Paracentesis Result Date: 08/24/2023 INDICATION: End-stage renal disease on hemodialysis with recurrent ascites. Request for therapeutic paracentesis. EXAM: ULTRASOUND GUIDED  PARACENTESIS MEDICATIONS: 1% lidocaine 10 mL COMPLICATIONS: None immediate. PROCEDURE: Informed written consent was obtained from the patient after a discussion of the risks, benefits and alternatives to treatment. A timeout was performed prior to the initiation of the procedure. Initial ultrasound scanning demonstrates a large amount of ascites within the right lower abdominal quadrant. The right lower abdomen was prepped and draped in the usual sterile fashion. 1% lidocaine was used for local anesthesia. Following this, a 19 gauge, 7-cm, Yueh catheter was introduced. An ultrasound image was saved for documentation purposes. The paracentesis was performed. The catheter was removed and a dressing was applied. The patient tolerated the procedure well without immediate post procedural complication. FINDINGS:  A total of approximately 5 L of clear yellow fluid was removed. IMPRESSION: Successful ultrasound-guided paracentesis yielding 5 liters of peritoneal fluid. Procedure performed by: Corrin Parker, PA-C Electronically Signed   By: Malachy Moan M.D.   On: 08/24/2023 15:03   US Abdomen Limited Result Date: 08/21/2023 CLINICAL DATA:  Evaluate for ascites. EXAM: LIMITED ABDOMEN ULTRASOUND FOR ASCITES TECHNIQUE: Limited ultrasound survey for ascites was performed in all four abdominal quadrants. COMPARISON:  Ultrasound paracentesis 08/20/2023 FINDINGS: There is a moderate-to-large amount of ascites in all 4 abdominal quadrants. Bilateral pleural effusions are present. IMPRESSION: 1. Moderate-to-large amount of ascites in all 4 abdominal quadrants. 2. Bilateral pleural effusions. Electronically Signed   By: Darliss Cheney M.D.   On: 08/21/2023 23:13   IR Paracentesis Result Date: 08/20/2023 INDICATION: 27 year old male with end stage renal disease on HD, now with fluid overload and ascites. Request made for paracentesis. EXAM: ULTRASOUND GUIDED THERAPEUTIC PARACENTESIS MEDICATIONS: 10 mL 1% lidocaine  COMPLICATIONS: None immediate. PROCEDURE: Informed written consent was obtained from the patient after a discussion of the risks, benefits and alternatives to treatment. A timeout was performed prior to the initiation of the procedure. Initial ultrasound scanning demonstrates a moderate amount of ascites within the left lateral abdominal quadrant. The right lower abdomen was prepped and draped in the usual sterile fashion. 1% lidocaine was used for local anesthesia. Following this, a 19 gauge, 7-cm, Yueh catheter was introduced. An ultrasound image was saved for documentation purposes. The paracentesis was performed. The catheter was removed and a dressing was applied. The patient tolerated the procedure well without immediate post procedural complication. Patient received post-procedure intravenous albumin; see nursing notes for details. FINDINGS: A total of approximately 3.9 liters of yellow fluid was removed. Samples were sent to the laboratory as requested by the clinical team. IMPRESSION: Successful ultrasound-guided paracentesis yielding 3.9 liters of peritoneal fluid. Performed by: Loyce Dys PA-C Electronically Signed   By: Malachy Moan M.D.   On: 08/20/2023 10:15   VAS [place] LOWER EXTREMITY VENOUS (DVT) Result Date: 08/10/2023  Lower Venous DVT Study Patient Name:  MICHIO BODENSCHATZ  Date of Exam:   08/09/2023 Medical Rec #: [id number]           Accession #:    [id number] Date of Birth: 03-Jul-1996            Patient Gender: M Patient Age:   64 years Exam Location:  [facility] Procedure:      VAS [place] LOWER EXTREMITY VENOUS (DVT) Referring Phys: AMRIT ADHIKARI --------------------------------------------------------------------------------  Indications: Swelling, Edema, and Severe pitting edema.  Limitations: Body habitus and poor ultrasound/tissue interface. Performing Technologist: Fernande Bras  Examination Guidelines: A complete evaluation includes B-mode imaging, spectral Doppler,  color Doppler, and power Doppler as needed of all accessible portions of each vessel. Bilateral testing is considered an integral part of a complete examination. Limited examinations for reoccurring indications may be performed as noted. The reflux portion of the exam is performed with the patient in reverse Trendelenburg.  +---------+---------------+---------+-----------+----------+----------------+ RIGHT    CompressibilityPhasicitySpontaneityPropertiesThrombus Aging   +---------+---------------+---------+-----------+----------+----------------+ CFV      Full           Yes      Yes                                   +---------+---------------+---------+-----------+----------+----------------+ SFJ      Full                                                          +---------+---------------+---------+-----------+----------+----------------+  FV Prox  Full                                                          +---------+---------------+---------+-----------+----------+----------------+ FV Mid   Full                                                          +---------+---------------+---------+-----------+----------+----------------+ FV DistalFull                                                          +---------+---------------+---------+-----------+----------+----------------+ PFV      Full                                                          +---------+---------------+---------+-----------+----------+----------------+ POP      Full           Yes      Yes                                   +---------+---------------+---------+-----------+----------+----------------+ PTV                                                   Patent by color. +---------+---------------+---------+-----------+----------+----------------+ PERO                                                  Patent by color.  +---------+---------------+---------+-----------+----------+----------------+   +---------+---------------+---------+-----------+----------+----------------+ LEFT     CompressibilityPhasicitySpontaneityPropertiesThrombus Aging   +---------+---------------+---------+-----------+----------+----------------+ CFV      Full           Yes      Yes                                   +---------+---------------+---------+-----------+----------+----------------+ SFJ      Full                                                          +---------+---------------+---------+-----------+----------+----------------+ FV Prox  Full                                                          +---------+---------------+---------+-----------+----------+----------------+  FV Mid   Full                                                          +---------+---------------+---------+-----------+----------+----------------+ FV DistalFull                                                          +---------+---------------+---------+-----------+----------+----------------+ PFV      Full                                                          +---------+---------------+---------+-----------+----------+----------------+ POP      Full           Yes      Yes                                   +---------+---------------+---------+-----------+----------+----------------+ PTV                                                   Patent by color. +---------+---------------+---------+-----------+----------+----------------+ PERO                                                  Patent by color. +---------+---------------+---------+-----------+----------+----------------+     Summary: RIGHT: - There is no evidence of deep vein thrombosis in the lower extremity. However, portions of this examination were limited- see technologist comments above.  - No cystic structure found in the popliteal fossa.   LEFT: - There is no evidence of deep vein thrombosis in the lower extremity. However, portions of this examination were limited- see technologist comments above.  - No cystic structure found in the popliteal fossa.  *See table(s) above for measurements and observations. Electronically signed by Heath Lark on 08/10/2023 at 9:37:12 AM.    Final    CT KNEE LEFT WO CONTRAST Result Date: 08/09/2023 CLINICAL DATA:  Knee trauma, clinical concern for occult fracture. EXAM: CT OF THE LEFT KNEE WITHOUT CONTRAST TECHNIQUE: Multidetector CT imaging of the left knee was performed according to the standard protocol. Multiplanar CT image reconstructions were also generated. RADIATION DOSE REDUCTION: This exam was performed according to the departmental dose-optimization program which includes automated exposure control, adjustment of the mA and/or kV according to patient size and/or use of iterative reconstruction technique. COMPARISON:  Radiographs 08/08/2023 FINDINGS: Body habitus reduces diagnostic sensitivity and specificity. Bones/Joint/Cartilage Mild medial compartmental articular space narrowing may be a manifestation of mild degenerative chondral thinning in the medial compartment. No CT findings of acute fracture. There is a moderate knee effusion. No free osteochondral fragment identified. Ligaments Suboptimally assessed by CT. Muscles and Tendons Unremarkable Soft tissues  Substantial and advanced for age atheromatous vascular calcification. Extensive diffuse subcutaneous edema circumferentially in the distal thigh, knee, and calf. Extensive speckled subcutaneous calcifications in the calf, likely from chronic venous insufficiency. IMPRESSION: 1. No CT findings of acute fracture. 2. Moderate knee effusion. 3. Extensive diffuse subcutaneous edema circumferentially in the distal thigh, knee, and calf. 4. Extensive speckled subcutaneous calcifications in the calf, likely from chronic venous insufficiency. 5.  Substantial and advanced for age atheromatous vascular calcification. 6. Mild medial compartmental articular space narrowing may be a manifestation of mild degenerative chondral thinning in the medial compartment. Electronically Signed   By: Gaylyn Rong M.D.   On: 08/09/2023 21:03   CT PELVIS WO CONTRAST Result Date: 08/09/2023 CLINICAL DATA:  Hip fracture suspected, x-ray performed EXAM: CT PELVIS WITHOUT CONTRAST TECHNIQUE: Multidetector CT imaging of the pelvis was performed following the standard protocol without intravenous contrast. RADIATION DOSE REDUCTION: This exam was performed according to the departmental dose-optimization program which includes automated exposure control, adjustment of the mA and/or kV according to patient size and/or use of iterative reconstruction technique. COMPARISON:  Hip radiograph from 1 day prior FINDINGS: Negative for hip or pelvic ring fracture. No malalignment. No hip joint effusion, erosion, or degenerative spurring. There is pronounced anasarca with intermuscular edema symmetrically seen around the pelvis. Large volume ascites. IMPRESSION: 1. No acute osseous finding. 2. Large volume ascites and extensive anasarca. Electronically Signed   By: Tiburcio Pea M.D.   On: 08/09/2023 20:11   DG HIP UNILAT WITH PELVIS 2-3 VIEWS RIGHT Result Date: 08/08/2023 CLINICAL DATA:  Weakness EXAM: DG HIP (WITH OR WITHOUT PELVIS) 2-3V RIGHT COMPARISON:  None Available. FINDINGS: Body habitus limits evaluation. No evidence of acute fracture or dislocation of the right hip. There appears to be a nondisplaced fracture of the right superior pubic ramus. Probable old fracture of the left inferior pubic ramus. SI joints and symphysis pubis are not displaced. Vascular calcifications. IMPRESSION: No acute fracture or dislocation of the right hip. Probable nondisplaced fracture of the right superior pubic ramus, possibly acute. Probable old fracture of the left inferior pubic ramus.  Electronically Signed   By: Burman Nieves M.D.   On: 08/08/2023 20:32   DG HIP UNILAT WITH PELVIS 2-3 VIEWS LEFT Result Date: 08/08/2023 CLINICAL DATA:  Weakness EXAM: DG HIP (WITH OR WITHOUT PELVIS) 2-3V LEFT COMPARISON:  CT 04/23/2023 FINDINGS: AP view is limited by pannus. No definitive fracture or malalignment. IMPRESSION: No acute osseous abnormality. Femoral head partially obscured on frontal view by the patient's pannus Electronically Signed   By: Jasmine Pang M.D.   On: 08/08/2023 20:31   DG Knee 1-2 Views Left Result Date: 08/08/2023 CLINICAL DATA:  Weakness. EXAM: LEFT KNEE - 1-2 VIEW COMPARISON:  Right knee radiograph dated 08/08/2023. FINDINGS: There is no acute fracture or dislocation. Evaluation however is limited due to osteopenia and body habitus and soft tissue attenuation. No significant arthritic changes. There is a small to moderate suprapatellar effusion. Diffuse subcutaneous edema. Amorphous calcification of the subcutaneous soft tissues of the lower extremity likely related to chronic venous stasis. IMPRESSION: 1. No acute fracture or dislocation. 2. Small to moderate suprapatellar effusion. Electronically Signed   By: Elgie Collard M.D.   On: 08/08/2023 20:30   DG Knee 1-2 Views Right Result Date: 08/08/2023 CLINICAL DATA:  Weakness. EXAM: RIGHT KNEE - 1-2 VIEW COMPARISON:  Left knee radiograph dated 08/08/2023. FINDINGS: There is no acute fracture or dislocation. The bones are osteopenic. No significant arthritic changes.  Trace suprapatellar effusion suspected. There is diffuse subcutaneous edema. No opaque foreign object or soft tissue gas. IMPRESSION: 1. No acute fracture or dislocation. 2. Osteopenia. Electronically Signed   By: Elgie Collard M.D.   On: 08/08/2023 20:28   DG Tibia/Fibula Left Port Result Date: 08/06/2023 CLINICAL DATA:  Left leg pain. EXAM: PORTABLE LEFT TIBIA AND FIBULA - 2 VIEW COMPARISON:  Left knee radiographs 06/13/2023 FINDINGS: No acute  fracture or destructive bone lesion is identified. The knee and ankle are located. Diffuse soft tissue calcification is again seen and may be secondary to chronic venous stasis. Atherosclerotic vascular calcifications are also noted. IMPRESSION: No acute osseous abnormality. Electronically Signed   By: Sebastian Ache M.D.   On: 08/06/2023 12:02   US Paracentesis Result Date: 08/05/2023 INDICATION: History of congestive heart failure and end-stage renal disease, now with recurrent symptomatic ascites. Request made for ultrasound-guided paracentesis for therapeutic purposes. EXAM: ULTRASOUND GUIDED PARACENTESIS MEDICATIONS: 10 cc 1% lidocaine. COMPLICATIONS: None immediate. PROCEDURE: Informed written consent was obtained from the patient after a discussion of the risks, benefits and alternatives to treatment. A timeout was performed prior to the initiation of the procedure. Initial ultrasound scanning demonstrates a large amount of ascites within the right lower abdominal quadrant. The right lower abdomen was prepped and draped in the usual sterile fashion. 1% lidocaine was used for local anesthesia. Following this, a 7 cm Yueh catheter was introduced. An ultrasound image was saved for documentation purposes. The paracentesis was performed. The catheter was removed and a dressing was applied. The patient tolerated the procedure well without immediate post procedural complication. Patient received post-procedure intravenous albumin; see nursing notes for details. FINDINGS: A total of approximately 3 Liters of amber fluid was removed. IMPRESSION: Successful ultrasound-guided paracentesis yielding 3 liters of peritoneal fluid. Performed by [facility] Electronically Signed   By: Simonne Come M.D.   On: 08/05/2023 10:02   DG Chest Port 1 View Result Date: 08/04/2023 CLINICAL DATA:  Dyspnea EXAM: PORTABLE CHEST 1 VIEW COMPARISON:  Chest radiograph dated 07/10/2023 FINDINGS: Markedly low lung volumes. Bilateral  diffuse interstitial and patchy opacities. Dense bibasilar opacities. Similar moderate bilateral pleural effusions. Similar enlarged cardiomediastinal silhouette. No acute osseous abnormality. IMPRESSION: 1. Markedly low lung volumes with bilateral diffuse interstitial and patchy opacities, likely pulmonary edema. 2. Similar moderate bilateral pleural effusions with dense bibasilar opacities, likely atelectasis. 3. Similar cardiomegaly. Electronically Signed   By: Agustin Cree M.D.   On: 08/04/2023 12:11    Microbiology: No results found for this or any previous visit (from the past 240 hours).   Labs: Basic Metabolic Panel: Recent Labs  Lab 08/22/23 0245 08/23/23 0227 08/24/23 0248 08/25/23 0227 08/26/23 0258 08/28/23 0140 08/28/23 0804  NA 137   < > 138 141 139 141 138  K 4.9   < > 4.8 5.0 4.8 4.9 4.2  CL 93*   < > 93* 91* 93* 94* 93*  CO2 30   < > 33* 32 32 32 29  GLUCOSE 226*   < > 174* 121* 150* 105* 146*  BUN 90*   < > 81* 103* 84* 85* 58*  CREATININE 5.97*   < > 5.53* 6.58* 6.19* 6.22* 5.05*  CALCIUM 9.3   < > 9.6 9.8 9.4 9.5 9.7  PHOS 6.9*  --   --   --   --  7.0*  --    < > = values in this interval not displayed.   Liver Function Tests: Recent  Labs  Lab 08/22/23 0245 08/28/23 0140  ALBUMIN 2.4* 2.6*   No results for input(s): "LIPASE", "AMYLASE" in the last 168 hours. No results for input(s): "AMMONIA" in the last 168 hours. CBC: Recent Labs  Lab 08/23/23 0730 08/25/23 0823  WBC 5.7 5.8  HGB 10.3* 10.4*  HCT 33.5* 34.4*  MCV 91.5 92.0  PLT 293 289   Cardiac Enzymes: No results for input(s): "CKTOTAL", "CKMB", "CKMBINDEX", "TROPONINI" in the last 168 hours. BNP: BNP (last 3 results) Recent Labs    04/01/23 1601 04/18/23 1700  BNP 4,219.5* 3,420.0*    ProBNP (last 3 results) No results for input(s): "PROBNP" in the last 8760 hours.  CBG: Recent Labs  Lab 08/27/23 1127 08/27/23 1534 08/27/23 2202 08/28/23 0912 08/28/23 1118  GLUCAP 171* 146*  84 148* 133*       Signed:  Zannie Cove MD.  [facility] 08/28/2023, 12:16 PM

## 2023-08-28 NOTE — Progress Notes (Signed)
[facility] Progress Note   Dialysis Orders: TTS - HP 4:15hr, 500/A2, EDW 112kg, 2K/2Ca, AVF, heparin 10K bolus - Mircera q 2 weeks - Hectoral q HD - Venofer course was given prior to admit   Assessment/Plan: 1. Chronic/severe volume overload: Large volume HD, min improvement per scale. Abd [place] 12/17 - still with B pleural effusions and large ascites.  S/p 2 large volume paracentesis. On exam - tiny bit of improvement, hard to tell. He has been unable to stand for weight. Has been drinking more than prior fluid restrictions, lowered to 1.2L/d on 12/20.  Continue to counsel about fluid restrictions.  Provided education about sources of fluids in diet.   Plan for serial HD again over next few days to improve edema and functional status in hopes to discharge early this week.   2. ESRD: Plan for serial dialysis next few days for volume removal. HD today- 6L UFG as tolerated.  Holiday Schedule this week, Monday, Thursday, Saturday.  Next HD on 12/23 (today); back-to-back days but he really needs it.  He has at least 20 kg on him, more likely much more than that.  He is planning on being discharged today to see his son who is coming in from out of town which is understandable.  I counseled him at length about being compliant with fluid restriction as he may end up coming right back to the hospital before his next outpatient treatment on Thursday.  I also told him the holiday schedule and that he needs to be there Thursday and Saturday.  Has a history of severe noncompliance.  Are able to get 6 L off of him overnight completing the treatment approximately 5 AM today.  I counseled him at length also about considering home dialysis which would be much more efficient removing excess volume; will be very difficult to make progress getting dialysis 3 times a week given his noncompliance with fluid intake, signing off early, missing treatments.  Missing treatments is certainly a concern  now that he is out of rehab  3. HTN/volume:  BP ok, chronic edema. See above. 4. Anemia: Hgb 10.4 - continue Aranesp q Monday while here. 5. Secondary hyperparathyroidism:  CorrCa high, Phos high. VDRA on hold. 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 Dislikes Velphoro and Gean Quint -> try sevelamer as binder.  Noncompliant with binders as well and diet. 6. Nutrition:  Alb low, changed from "heart healthy" to renal diet w/1267mL fluid restrictions. 7. T1DM: On insulin. 8. Ascites: Cirrhosis and nephrogenic ascites: S/p para 12/16 and 12/20. 9. LE weakness/pain: No fractures, result of #1 10. Dispo: Unfortunately bed weight exactly the same (or more) from admit. He wants to be home for Christmas with his son.  Discussed he will need improvement in functional status and volume to be able to discharge.    Subjective:   Patient seen and examined at bedside.  Tolerated dialysis well overnight completing approximately 5 AM with 6 L net UF achieved.   Discussed compliance and importance of completing treatments and following fluid restrictions to improve functional status so he will be able to go home.  Educated again about different sources of fluid in diet.  Tolerated dialysis on Sunday and early Tues AM.  Denies CP, SOB, abdominal pain and n/v/d.  Motivated to improve because he wants to be home for Christmas with his  son who is in town from [place].   Objective Vitals:   08/28/23 0430 08/28/23 0500 08/28/23 0530 08/28/23 0714  BP: (!) 127/94 (!) 143/76 (!) 148/111 (!) 146/80  Pulse:  79  84  Resp: 13 13  16   Temp:  97.7 F (36.5 C)  97.7 F (36.5 C)  TempSrc:  Oral  Oral  SpO2:  100% 100% 96%  Weight:      Height:       Physical Exam General:chronically ill appearing male in NAD Heart:RRR, no mrg Lungs:CTAB, nml WOB on 4L Abdomen:soft, NT,  +distended Extremities:2+ LE dense edema from hips to feet Dialysis Access: LU AVF   Filed Weights   08/26/23 1150 08/26/23 1622 08/27/23 0644  Weight: (!) 149.6 kg (S) (!) 137.6 kg (!) 140 kg    Intake/Output Summary (Last 24 hours) at 08/28/2023 0921 Last data filed at 08/28/2023 0500 Gross per 24 hour  Intake 840 ml  Output 6000 ml  Net -5160 ml    Additional Objective Labs: Basic Metabolic Panel: Recent Labs  Lab 08/22/23 0245 08/23/23 0227 08/25/23 0227 08/26/23 0258 08/28/23 0140  NA 137   < > 141 139 141  K 4.9   < > 5.0 4.8 4.9  CL 93*   < > 91* 93* 94*  CO2 30   < > 32 32 32  GLUCOSE 226*   < > 121* 150* 105*  BUN 90*   < > 103* 84* 85*  CREATININE 5.97*   < > 6.58* 6.19* 6.22*  CALCIUM 9.3   < > 9.8 9.4 9.5  PHOS 6.9*  --   --   --  7.0*   < > = values in this interval not displayed.   Liver Function Tests: Recent Labs  Lab 08/22/23 0245 08/28/23 0140  ALBUMIN 2.4* 2.6*   CBC: Recent Labs  Lab 08/23/23 0730 08/25/23 0823  WBC 5.7 5.8  HGB 10.3* 10.4*  HCT 33.5* 34.4*  MCV 91.5 92.0  PLT 293 289   Blood Culture    Component Value Date/Time   SDES BLOOD BLOOD RIGHT ARM 08/05/2023 0236   SPECREQUEST  08/05/2023 0236    BOTTLES DRAWN AEROBIC AND ANAEROBIC Blood Culture adequate volume   CULT  08/05/2023 0236    NO GROWTH 5 DAYS Performed at [facility], [address] [place], [place] [address]    REPTSTATUS 08/10/2023 FINAL 08/05/2023 0236    Studies/Results: No results found.   Medications:   carvedilol  25 mg Oral BID WC   Chlorhexidine Gluconate Cloth  6 each Topical Q0600   darbepoetin (ARANESP) injection - DIALYSIS  200 mcg Subcutaneous Q Mon-1800   feeding supplement (NEPRO CARB STEADY)  237 mL Oral BID BM   heparin  5,000 Units Subcutaneous Q8H   hydrALAZINE  100 mg Oral TID   insulin aspart  0-15 Units Subcutaneous TID WC   insulin aspart  0-5 Units Subcutaneous QHS   insulin aspart  3 Units Subcutaneous TID WC    insulin glargine-yfgn  5 Units Subcutaneous Daily   isosorbide mononitrate  90 mg Oral Daily   loratadine  10 mg Oral Daily   polyethylene glycol  17 g Oral Daily   sacubitril-valsartan  1 tablet Oral BID   sevelamer carbonate  1,600 mg Oral TID WC

## 2023-08-28 NOTE — Plan of Care (Signed)

## 2023-08-29 LAB — HEPATITIS B SURFACE ANTIBODY, QUANTITATIVE: Hep B S AB Quant (Post): <3.5 m[IU]/mL — ABNORMAL LOW

## 2023-08-29 NOTE — Discharge Planning (Signed)
[facility] Patient Discharge Orders- [facility] CLINIC: [place]  Patient's name: Bobby [name] Admit/DC Dates: 08/04/2023 - 08/28/2023  Discharge Diagnoses: Chronic volume overload   Decompensated cirrhosis last paracentesis 08/24/2023 Net Yield 5 L  Aranesp: Given: Yes   Date and amount of last dose: 08/20/2023 200 mcg SQ  Last Hgb: 10.4 PRBC's Given: NO Date/# of units: NA ESA dose for discharge: mircera 150 mcg IV q 2 weeks  IV Iron dose at discharge: per protocol  Heparin change: No  EDW Change: Yes New EDW: weight here are not correct. Nadir wt since admission 119.3 kg. Please access and call NP/PA   [place]ath Change: No  Access intervention/Change: No Details:  Hectorol/Calcitriol change: Yes Corrected Calcium 10.8 decrease hectorol to 6 mcg IV three times per week   Discharge Labs: Calcium 9.7 Phosphorus 7.0 Albumin 2.6 K+ 4.2  IV Antibiotics: No Details:  On Coumadin?: No Last INR: Next INR: Managed By:   OTHER/APPTS/LAB ORDERS: Natalie-velphoro was stopped in hospital. He has been discharge on very low dose Renvela 2 tabs PO TID AC. This will not be enough. He orders from [employer], ate fast foods throughout hospitalization.  RKB    D/C Meds to be reconciled by nurse after every discharge.  Completed By: Alonna Buckler [facility] [phone]    Reviewed by: MD:______ RN_______

## 2023-08-30 ENCOUNTER — Telehealth: Payer: Self-pay | Admitting: Nurse Practitioner

## 2023-08-30 NOTE — Progress Notes (Signed)
Late Note Entry- Aug 30, 2023  Pt D/C on Tuesday. Contacted [facility] [place] this am to be advised of pt's d/c date and that pt should resume care today.   Olivia Canter Renal Navigator [phone]

## 2023-08-30 NOTE — Telephone Encounter (Signed)
Transition of Care - Initial Contact from Inpatient Facility  Date of discharge: 08/28/2023 Date of contact: 08/30/2023 Method: Phone Spoke to: Patient  Patient contacted to discuss transition of care from recent inpatient hospitalization. Patient was admitted to [facility] from 08/04/2023 - 08/28/2023  with discharge diagnosis of Chronic Volume Overload  The discharge medication list was reviewed. Patient understands the changes and has no concerns.   Patient will return to his/her outpatient HD unit on: 08/30/2023 however patient did not go dialysis. Offered to call center to see if chair available tomorrow but he says he doesn't want to this, that he will go 09/01/2023.   No other concerns at this time.

## 2023-09-03 ENCOUNTER — Other Ambulatory Visit (HOSPITAL_COMMUNITY): Payer: Self-pay

## 2023-09-16 ENCOUNTER — Other Ambulatory Visit: Payer: Self-pay | Admitting: Nephrology

## 2023-09-16 DIAGNOSIS — N186 End stage renal disease: Secondary | ICD-10-CM

## 2023-09-16 DIAGNOSIS — E8770 Fluid overload, unspecified: Secondary | ICD-10-CM

## 2024-04-04 DEATH — deceased
